# Patient Record
Sex: Female | Born: 1975 | Race: White | Hispanic: No | Marital: Married | Smoking: Former smoker
Health system: Southern US, Community
[De-identification: ages and names within clinical notes are randomized; demographics above are authoritative.]

## PROBLEM LIST (undated history)

## (undated) DIAGNOSIS — Z8041 Family history of malignant neoplasm of ovary: Secondary | ICD-10-CM

## (undated) DIAGNOSIS — Z800 Family history of malignant neoplasm of digestive organs: Secondary | ICD-10-CM

## (undated) DIAGNOSIS — R519 Headache, unspecified: Secondary | ICD-10-CM

## (undated) DIAGNOSIS — R112 Nausea with vomiting, unspecified: Secondary | ICD-10-CM

## (undated) DIAGNOSIS — Z806 Family history of leukemia: Secondary | ICD-10-CM

## (undated) DIAGNOSIS — D649 Anemia, unspecified: Secondary | ICD-10-CM

## (undated) DIAGNOSIS — Z808 Family history of malignant neoplasm of other organs or systems: Secondary | ICD-10-CM

## (undated) DIAGNOSIS — G8929 Other chronic pain: Secondary | ICD-10-CM

## (undated) DIAGNOSIS — T7840XA Allergy, unspecified, initial encounter: Secondary | ICD-10-CM

## (undated) DIAGNOSIS — I429 Cardiomyopathy, unspecified: Secondary | ICD-10-CM

## (undated) DIAGNOSIS — N200 Calculus of kidney: Secondary | ICD-10-CM

## (undated) DIAGNOSIS — R51 Headache: Secondary | ICD-10-CM

## (undated) DIAGNOSIS — K602 Anal fissure, unspecified: Secondary | ICD-10-CM

## (undated) DIAGNOSIS — Z9889 Other specified postprocedural states: Secondary | ICD-10-CM

## (undated) DIAGNOSIS — C50919 Malignant neoplasm of unspecified site of unspecified female breast: Secondary | ICD-10-CM

## (undated) DIAGNOSIS — Z803 Family history of malignant neoplasm of breast: Secondary | ICD-10-CM

## (undated) DIAGNOSIS — R011 Cardiac murmur, unspecified: Secondary | ICD-10-CM

## (undated) DIAGNOSIS — Z9089 Acquired absence of other organs: Secondary | ICD-10-CM

## (undated) HISTORY — DX: Cardiac murmur, unspecified: R01.1

## (undated) HISTORY — DX: Calculus of kidney: N20.0

## (undated) HISTORY — DX: Malignant neoplasm of unspecified site of unspecified female breast: C50.919

## (undated) HISTORY — DX: Anemia, unspecified: D64.9

## (undated) HISTORY — DX: Acquired absence of other organs: Z90.89

## (undated) HISTORY — DX: Anal fissure, unspecified: K60.2

## (undated) HISTORY — DX: Headache: R51

## (undated) HISTORY — DX: Family history of malignant neoplasm of digestive organs: Z80.0

## (undated) HISTORY — DX: Family history of malignant neoplasm of breast: Z80.3

## (undated) HISTORY — DX: Family history of leukemia: Z80.6

## (undated) HISTORY — DX: Headache, unspecified: R51.9

## (undated) HISTORY — PX: LEEP: SHX91

## (undated) HISTORY — DX: Cardiomyopathy, unspecified: I42.9

## (undated) HISTORY — DX: Family history of malignant neoplasm of ovary: Z80.41

## (undated) HISTORY — DX: Family history of malignant neoplasm of other organs or systems: Z80.8

## (undated) HISTORY — DX: Other chronic pain: G89.29

## (undated) HISTORY — DX: Allergy, unspecified, initial encounter: T78.40XA

---

## 1998-01-22 HISTORY — PX: OTHER SURGICAL HISTORY: SHX169

## 2008-10-07 ENCOUNTER — Encounter: Admission: RE | Admit: 2008-10-07 | Discharge: 2008-10-07 | Payer: Self-pay | Admitting: Obstetrics and Gynecology

## 2008-11-10 ENCOUNTER — Inpatient Hospital Stay (HOSPITAL_COMMUNITY): Admission: AD | Admit: 2008-11-10 | Discharge: 2008-11-10 | Payer: Self-pay | Admitting: Obstetrics and Gynecology

## 2008-11-21 ENCOUNTER — Inpatient Hospital Stay (HOSPITAL_COMMUNITY): Admission: AD | Admit: 2008-11-21 | Discharge: 2008-11-21 | Payer: Self-pay | Admitting: Obstetrics and Gynecology

## 2008-12-04 ENCOUNTER — Inpatient Hospital Stay (HOSPITAL_COMMUNITY): Admission: AD | Admit: 2008-12-04 | Discharge: 2008-12-07 | Payer: Self-pay | Admitting: Obstetrics and Gynecology

## 2009-01-17 ENCOUNTER — Telehealth: Payer: Self-pay | Admitting: Gastroenterology

## 2009-04-12 ENCOUNTER — Encounter: Admission: RE | Admit: 2009-04-12 | Discharge: 2009-04-12 | Payer: Self-pay | Admitting: Family Medicine

## 2009-12-23 ENCOUNTER — Encounter: Admission: RE | Admit: 2009-12-23 | Discharge: 2009-12-23 | Payer: Self-pay | Admitting: Family Medicine

## 2010-02-12 ENCOUNTER — Encounter: Payer: Self-pay | Admitting: Family Medicine

## 2010-04-26 LAB — CBC
HCT: 37.4 % (ref 36.0–46.0)
Hemoglobin: 12.6 g/dL (ref 12.0–15.0)
MCHC: 33.6 g/dL (ref 30.0–36.0)
MCV: 95 fL (ref 78.0–100.0)
Platelets: 297 10*3/uL (ref 150–400)
Platelets: 337 10*3/uL (ref 150–400)
RBC: 3.94 MIL/uL (ref 3.87–5.11)
RDW: 13.9 % (ref 11.5–15.5)
RDW: 14 % (ref 11.5–15.5)
WBC: 16.1 10*3/uL — ABNORMAL HIGH (ref 4.0–10.5)

## 2010-04-26 LAB — RPR: RPR Ser Ql: NONREACTIVE

## 2010-04-27 LAB — URINALYSIS, ROUTINE W REFLEX MICROSCOPIC
Nitrite: NEGATIVE
Specific Gravity, Urine: 1.025 (ref 1.005–1.030)
Urobilinogen, UA: 0.2 mg/dL (ref 0.0–1.0)

## 2010-04-27 LAB — URINE MICROSCOPIC-ADD ON

## 2010-04-27 LAB — URINE CULTURE

## 2010-05-19 ENCOUNTER — Encounter (INDEPENDENT_AMBULATORY_CARE_PROVIDER_SITE_OTHER): Payer: Self-pay | Admitting: Surgery

## 2010-05-31 ENCOUNTER — Encounter (INDEPENDENT_AMBULATORY_CARE_PROVIDER_SITE_OTHER): Payer: Self-pay | Admitting: Surgery

## 2010-06-27 ENCOUNTER — Other Ambulatory Visit: Payer: Self-pay | Admitting: Family Medicine

## 2010-06-27 ENCOUNTER — Ambulatory Visit
Admission: RE | Admit: 2010-06-27 | Discharge: 2010-06-27 | Disposition: A | Discharge status: Home / Self Care | Payer: 59 | Source: Ambulatory Visit | Attending: Family Medicine | Admitting: Family Medicine

## 2010-06-27 DIAGNOSIS — R05 Cough: Secondary | ICD-10-CM

## 2010-06-27 DIAGNOSIS — R059 Cough, unspecified: Secondary | ICD-10-CM

## 2010-10-20 ENCOUNTER — Other Ambulatory Visit: Payer: Self-pay | Admitting: Obstetrics and Gynecology

## 2010-10-20 DIAGNOSIS — N644 Mastodynia: Secondary | ICD-10-CM

## 2011-03-21 ENCOUNTER — Encounter: Payer: Self-pay | Admitting: Gastroenterology

## 2011-04-06 ENCOUNTER — Encounter: Payer: Self-pay | Admitting: Gastroenterology

## 2011-04-06 ENCOUNTER — Ambulatory Visit (INDEPENDENT_AMBULATORY_CARE_PROVIDER_SITE_OTHER): Payer: 59 | Admitting: Gastroenterology

## 2011-04-06 ENCOUNTER — Other Ambulatory Visit (INDEPENDENT_AMBULATORY_CARE_PROVIDER_SITE_OTHER): Payer: 59

## 2011-04-06 VITALS — BP 102/68 | HR 60 | Ht 68.0 in | Wt 147.2 lb

## 2011-04-06 DIAGNOSIS — R1032 Left lower quadrant pain: Secondary | ICD-10-CM

## 2011-04-06 LAB — BASIC METABOLIC PANEL
CO2: 28 mEq/L (ref 19–32)
Calcium: 9.3 mg/dL (ref 8.4–10.5)
Glucose, Bld: 89 mg/dL (ref 70–99)
Sodium: 140 mEq/L (ref 135–145)

## 2011-04-06 LAB — CBC WITH DIFFERENTIAL/PLATELET
Eosinophils Relative: 2 % (ref 0.0–5.0)
HCT: 38.6 % (ref 36.0–46.0)
Hemoglobin: 12.7 g/dL (ref 12.0–15.0)
Lymphs Abs: 1.5 10*3/uL (ref 0.7–4.0)
Monocytes Relative: 8.2 % (ref 3.0–12.0)
Platelets: 294 10*3/uL (ref 150.0–400.0)
WBC: 3.9 10*3/uL — ABNORMAL LOW (ref 4.5–10.5)

## 2011-04-06 MED ORDER — PEG-KCL-NACL-NASULF-NA ASC-C 100 G PO SOLR: 1.0000 | ORAL | Status: DC

## 2011-04-06 NOTE — Patient Instructions (Signed)
You will have labs checked today in the basement lab.  Please head down after you check out with the front desk  (cbc, bmet). You will be set up for a colonoscopy for minor bleeding, llq pains (LEC) in 3-4 weeks.  Miralax (OTC powder) take one full (smaller size) bottle today or tomorrow, then start one dose of the miralax every day. A copy of this information will be made available to Dr. Vincente Poli.

## 2011-04-06 NOTE — Progress Notes (Signed)
HPI: [name].  Left sided abd pains for about 5 weeks. The pain is nearly constant, waxes and wanes.  The pain is dull and constant.  The abd is tender.  No fevers or chills.  + sweats at night.  No changes in her bowels.  She has had a lot of bowel issues since gave birth (had anal fissure, caused pain, bleeding).  Eventually went to pelvic floor specialist.  She still has bleeding just about every time she has a BM.  Overall weight loss intentionally.  She takes colace about every other week.  She used to take benefiber after pregancy.   She had a recent transvaginal ultrasound and she was told that she probably had a lot of stool in her colon.  November 2010 delivery  Saw eagle MD 2 years ago, terrible anal pain.    camplyobacter in 2005, worked in [place].    Review of systems: Pertinent positive and negative review of systems were noted in the above HPI section. Complete review of systems was performed and was otherwise normal.    Past Medical History  Diagnosis Date  . S/P tonsillectomy     2000  . Delivery normal nov.2010  . Anemia   . Asthma   . Chronic headaches   . Kidney stone     x of    Past Surgical History  Procedure Date  . Tonsil removal 2000    No current outpatient prescriptions on file.    Allergies as of 04/06/2011 - Review Complete 04/06/2011  Allergen Reaction Noted  . Ceclor (cefaclor)  05/19/2010    Family History  Problem Relation Age of Onset  . Memory loss Mother   . Ulcerative colitis Mother   . Colon polyps Father   . Irritable bowel syndrome Sister   . Colon cancer Paternal Aunt   . Prostate cancer Paternal Uncle   . Colon polyps Paternal Uncle   . Ovarian cancer Maternal Grandmother   . Uterine cancer Maternal Grandmother     History   Social History  . Marital Status: Married    Spouse Name: N/A    Number of Children: 1  . Years of  Education: N/A   Occupational History  . Housewife    Social History Main Topics  . Smoking status: Former Games developer  . Smokeless tobacco: Never Used  . Alcohol Use: No  . Drug Use: No  . Sexually Active: Not on file   Other Topics Concern  . Not on file   Social History Narrative  . No narrative on file       Physical Exam: BP 102/68  Pulse 60  Ht 5\' 8"  (1.727 m)  Wt 147 lb 3.2 oz (66.769 kg)  BMI 22.38 kg/m2  LMP 03/31/2011 Constitutional: generally well-appearing Psychiatric: alert and oriented x3 Eyes: extraocular movements intact Mouth: oral pharynx moist, no lesions Neck: supple no lymphadenopathy Cardiovascular: heart regular rate and rhythm Lungs: clear to auscultation bilaterally Abdomen: soft, mildly tender on the left side, nondistended, no obvious ascites, no peritoneal signs, normal bowel sounds Extremities: no lower extremity edema bilaterally Skin: no lesions on visible extremities    Assessment and plan: 36 y.o. female with  intermittent rectal bleeding, left-sided abdominal pains.  I think we should proceed with colonoscopy at her since convenience given her intermittent rectal bleeding. She has never had a complete evaluation of her colon. She  was told she might have extra stool in her colon recently and she admits to previous trouble with constipation although she does move her bowels every day I wonder she is just chronically backed up and that can cause some of her left-sided pains. She will undergo a MiraLax purge and then start MiraLax 1 scoop daily following that. I doubt she has diverticulitis, I would expect her to be much sicker by now. She will get a CBC and basic metabolic profile today.

## 2011-04-09 ENCOUNTER — Telehealth: Payer: Self-pay | Admitting: Gastroenterology

## 2011-04-09 NOTE — Telephone Encounter (Signed)
We can probably do it next wedneday at [facility], she needs to purge before then though.

## 2011-04-09 NOTE — Telephone Encounter (Signed)
Left message on machine to call back  

## 2011-04-09 NOTE — Telephone Encounter (Signed)
Pt has been notified and reinstructed  

## 2011-04-09 NOTE — Telephone Encounter (Signed)
Pt has increasing pain in the lower abdomen.  She has NOT done the miralax purge as recommended.   She also states her insurance is changing and would like to move the procedure up before she looses coverage.  I advised the pt to use the miralax as suggested and I would call her back as soon as Dr Christella Hartigan reviews.  Can we move the procedure sooner?

## 2011-04-09 NOTE — Telephone Encounter (Signed)
04/18/11 930 am lec colon moved need to notify pt Left message on machine to call back

## 2011-04-16 ENCOUNTER — Telehealth: Payer: Self-pay | Admitting: Gastroenterology

## 2011-04-16 NOTE — Telephone Encounter (Signed)
$  20 dollar rebate coupon was left at the front desk for the pt to pick up on Thursday before her colon

## 2011-04-16 NOTE — Telephone Encounter (Signed)
Left message on machine to call back  

## 2011-04-18 ENCOUNTER — Encounter: Payer: Self-pay | Admitting: Gastroenterology

## 2011-04-18 ENCOUNTER — Ambulatory Visit (AMBULATORY_SURGERY_CENTER): Payer: 59 | Admitting: Gastroenterology

## 2011-04-18 VITALS — BP 118/76 | HR 75 | Temp 98.4°F | Resp 16 | Ht 68.0 in | Wt 147.0 lb

## 2011-04-18 DIAGNOSIS — D126 Benign neoplasm of colon, unspecified: Secondary | ICD-10-CM

## 2011-04-18 DIAGNOSIS — R1032 Left lower quadrant pain: Secondary | ICD-10-CM

## 2011-04-18 MED ORDER — SODIUM CHLORIDE 0.9 % IV SOLN: 500.0000 mL | INTRAVENOUS | Status: DC

## 2011-04-18 MED ORDER — HYOSCYAMINE SULFATE ER 0.375 MG PO TB12: 375.0000 ug | ORAL_TABLET | Freq: Two times a day (BID) | ORAL | Status: DC

## 2011-04-18 NOTE — Op Note (Signed)
[facility] [address] [place], [place]  [address]  COLONOSCOPY PROCEDURE REPORT  PATIENT:  [name], Kaitlin  MR#:  [id number] BIRTHDATE:  1975-12-15, 35 yrs. old  GENDER:  female ENDOSCOPIST:  Rachael Fee, MD REF. BY:  Marcelle Overlie, M.D. PROCEDURE DATE:  04/18/2011 PROCEDURE:  Colonoscopy with snare polypectomy ASA CLASS:  Class II INDICATIONS:  llq abd pain, minor rectal bleeding MEDICATIONS:   Fentanyl 100 mcg IV, These medications were titrated to patient response per physician's verbal order, Versed 10 mg IV  DESCRIPTION OF PROCEDURE:   After the risks benefits and alternatives of the procedure were thoroughly explained, informed consent was obtained.  Digital rectal exam was performed and revealed no rectal masses.   The LB 180AL [id number] endoscope was introduced through the anus and advanced to the terminal ileum which was intubated for a short distance, without limitations. The quality of the prep was good..  The instrument was then slowly withdrawn as the colon was fully examined. <<PROCEDUREIMAGES>> FINDINGS:  A diminutive polyp was found in the transverse colon. This was removed with cold snare and sent to pathology (jar 1) (see image4).  The terminal ileum appeared normal (see image3). This was otherwise a normal examination of the colon (see image5 and image1).   Retroflexed views in the rectum revealed no abnormalities. COMPLICATIONS:  None  ENDOSCOPIC IMPRESSION: 1) Diminutive polyp in the transverse colon, removed and sent to pathology 2) Normal terminal ileum 3) Otherwise normal examination  RECOMMENDATIONS: 1) If the polyp(s) removed today are proven to be adenomatous (pre-cancerous) polyps, you will need a repeat colonoscopy in 5 years. Otherwise you should continue to follow colorectal cancer screening guidelines for "routine risk" patients with colonoscopy in 10 years. You will receive a letter within 1-2 weeks with the results of  your biopsy as well as final recommendations. Please call my office if you have not received a letter after 3 weeks. 2) Trial of antispasm medicine to treat possible IBS. This was called into your pharmacy today.  ______________________________ Rachael Fee, MD  n. eSIGNED:   Rachael Fee at 04/18/2011 09:45 AM  Mattie Marlin, [phone]

## 2011-04-18 NOTE — Progress Notes (Signed)
Patient did not experience any of the following events: a burn prior to discharge; a fall within the facility; wrong site/side/patient/procedure/implant event; or a hospital transfer or hospital admission upon discharge from the facility. (G8907) Patient did not have preoperative order for IV antibiotic SSI prophylaxis. (G8918)  

## 2011-04-18 NOTE — Patient Instructions (Signed)
Discharge instructions given with verbal understanding. Handout on polyps given. Resume previous medications. YOU HAD AN ENDOSCOPIC PROCEDURE TODAY AT THE [facility]: Refer to the procedure report that was given to you for any specific questions about what was found during the examination.  If the procedure report does not answer your questions, please call your gastroenterologist to clarify.  If you requested that your care partner not be given the details of your procedure findings, then the procedure report has been included in a sealed envelope for you to review at your convenience later.  YOU SHOULD EXPECT: Some feelings of bloating in the abdomen. Passage of more gas than usual.  Walking can help get rid of the air that was put into your GI tract during the procedure and reduce the bloating. If you had a lower endoscopy (such as a colonoscopy or flexible sigmoidoscopy) you may notice spotting of blood in your stool or on the toilet paper. If you underwent a bowel prep for your procedure, then you may not have a normal bowel movement for a few days.  DIET: Your first meal following the procedure should be a light meal and then it is ok to progress to your normal diet.  A half-sandwich or bowl of soup is an example of a good first meal.  Heavy or fried foods are harder to digest and may make you feel nauseous or bloated.  Likewise meals heavy in dairy and vegetables can cause extra gas to form and this can also increase the bloating.  Drink plenty of fluids but you should avoid alcoholic beverages for 24 hours.  ACTIVITY: Your care partner should take you home directly after the procedure.  You should plan to take it easy, moving slowly for the rest of the day.  You can resume normal activity the day after the procedure however you should NOT DRIVE or use heavy machinery for 24 hours (because of the sedation medicines used during the test).    SYMPTOMS TO REPORT IMMEDIATELY: A  gastroenterologist can be reached at any hour.  During normal business hours, 8:30 AM to 5:00 PM Monday through Friday, call [phone].  After hours and on weekends, please call the GI answering service at [phone] who will take a message and have the physician on call contact you.   Following lower endoscopy (colonoscopy or flexible sigmoidoscopy):  Excessive amounts of blood in the stool  Significant tenderness or worsening of abdominal pains  Swelling of the abdomen that is new, acute  Fever of 100F or higher  FOLLOW UP: If any biopsies were taken you will be contacted by phone or by letter within the next 1-3 weeks.  Call your gastroenterologist if you have not heard about the biopsies in 3 weeks.  Our staff will call the home number listed on your records the next business day following your procedure to check on you and address any questions or concerns that you may have at that time regarding the information given to you following your procedure. This is a courtesy call and so if there is no answer at the home number and we have not heard from you through the emergency physician on call, we will assume that you have returned to your regular daily activities without incident.  SIGNATURES/CONFIDENTIALITY: You and/or your care partner have signed paperwork which will be entered into your electronic medical record.  These signatures attest to the fact that that the information above on your After Visit Summary has   been reviewed and is understood.  Full responsibility of the confidentiality of this discharge information lies with you and/or your care-partner. 

## 2011-04-19 ENCOUNTER — Telehealth: Payer: Self-pay | Admitting: *Deleted

## 2011-04-19 NOTE — Telephone Encounter (Signed)
Message left

## 2011-04-27 ENCOUNTER — Other Ambulatory Visit: Payer: Self-pay

## 2011-04-30 ENCOUNTER — Encounter: Payer: Self-pay | Admitting: Gastroenterology

## 2011-05-02 ENCOUNTER — Telehealth: Payer: Self-pay | Admitting: Gastroenterology

## 2011-05-03 NOTE — Telephone Encounter (Signed)
Left message on machine to call back  

## 2011-05-03 NOTE — Telephone Encounter (Signed)
The pt is calling because she says she has noticed NO difference in her pain on the levbid prescribed.  She also says that her pain is worse when her bladder is full or she has to have a bowel movement and wonders if the cause could be something other than GI.  Please advise

## 2011-05-04 MED ORDER — HYOSCYAMINE SULFATE 0.125 MG SL SUBL: 0.1250 mg | SUBLINGUAL_TABLET | SUBLINGUAL | Status: DC | PRN

## 2011-05-04 NOTE — Telephone Encounter (Signed)
Left message on machine to call back  

## 2011-05-04 NOTE — Telephone Encounter (Signed)
Sounds like IBS type pains, please tell her to stop the lebvid and I called her in some sublingual antispasm meds to try (should be faster acting and may work better for her)

## 2011-05-04 NOTE — Telephone Encounter (Signed)
Pt has been notified.

## 2011-05-08 ENCOUNTER — Other Ambulatory Visit: Payer: 59 | Admitting: Gastroenterology

## 2011-09-04 LAB — OB RESULTS CONSOLE ABO/RH: RH Type: POSITIVE

## 2011-09-04 LAB — OB RESULTS CONSOLE GC/CHLAMYDIA
Chlamydia: NEGATIVE
Gonorrhea: NEGATIVE

## 2011-09-04 LAB — OB RESULTS CONSOLE HEPATITIS B SURFACE ANTIGEN: Hepatitis B Surface Ag: NEGATIVE

## 2011-09-10 ENCOUNTER — Other Ambulatory Visit: Payer: Self-pay | Admitting: Obstetrics and Gynecology

## 2011-11-28 ENCOUNTER — Ambulatory Visit (HOSPITAL_COMMUNITY)
Admission: RE | Admit: 2011-11-28 | Discharge: 2011-11-28 | Disposition: A | Discharge status: Home / Self Care | Payer: Managed Care, Other (non HMO) | Source: Ambulatory Visit | Attending: Obstetrics and Gynecology | Admitting: Obstetrics and Gynecology

## 2011-11-28 DIAGNOSIS — R52 Pain, unspecified: Secondary | ICD-10-CM

## 2011-11-28 DIAGNOSIS — M7989 Other specified soft tissue disorders: Secondary | ICD-10-CM

## 2011-11-28 NOTE — Progress Notes (Signed)
*  Preliminary Results* Bilateral lower extremity venous duplex completed. Bilateral lower extremities are negative for deep vein thrombosis. There is no evidence of Baker's cyst bilaterally. Attempted to call in preliminary results with no answer, patient advised to go home and if there are further instructions she will be contacted by phone.  11/28/2011 2:34 PM Gertie Fey, RDMS, RDCS

## 2012-03-12 ENCOUNTER — Encounter (HOSPITAL_COMMUNITY): Payer: Self-pay

## 2012-03-12 ENCOUNTER — Inpatient Hospital Stay (HOSPITAL_COMMUNITY)
Admission: AD | Admit: 2012-03-12 | Discharge: 2012-03-12 | Disposition: A | Discharge status: Home / Self Care | Payer: Managed Care, Other (non HMO) | Source: Ambulatory Visit | Attending: Obstetrics and Gynecology | Admitting: Obstetrics and Gynecology

## 2012-03-12 DIAGNOSIS — R102 Pelvic and perineal pain: Secondary | ICD-10-CM

## 2012-03-12 DIAGNOSIS — O99891 Other specified diseases and conditions complicating pregnancy: Secondary | ICD-10-CM | POA: Insufficient documentation

## 2012-03-12 DIAGNOSIS — R109 Unspecified abdominal pain: Secondary | ICD-10-CM | POA: Insufficient documentation

## 2012-03-12 DIAGNOSIS — O26899 Other specified pregnancy related conditions, unspecified trimester: Secondary | ICD-10-CM

## 2012-03-12 DIAGNOSIS — O9989 Other specified diseases and conditions complicating pregnancy, childbirth and the puerperium: Secondary | ICD-10-CM

## 2012-03-12 DIAGNOSIS — N949 Unspecified condition associated with female genital organs and menstrual cycle: Secondary | ICD-10-CM

## 2012-03-12 LAB — URINE MICROSCOPIC-ADD ON

## 2012-03-12 LAB — CBC
HCT: 31.6 % — ABNORMAL LOW (ref 36.0–46.0)
Hemoglobin: 10.7 g/dL — ABNORMAL LOW (ref 12.0–15.0)
MCH: 31.2 pg (ref 26.0–34.0)
MCV: 92.1 fL (ref 78.0–100.0)
Platelets: 255 10*3/uL (ref 150–400)
RBC: 3.43 MIL/uL — ABNORMAL LOW (ref 3.87–5.11)
WBC: 9.4 10*3/uL (ref 4.0–10.5)

## 2012-03-12 LAB — URINALYSIS, ROUTINE W REFLEX MICROSCOPIC
Glucose, UA: NEGATIVE mg/dL
Ketones, ur: NEGATIVE mg/dL
Protein, ur: NEGATIVE mg/dL

## 2012-03-12 MED ORDER — CYCLOBENZAPRINE HCL 10 MG PO TABS
5.0000 mg | ORAL_TABLET | Freq: Once | ORAL | Status: AC
  Administered 2012-03-12: 5 mg via ORAL
  Filled 2012-03-12: qty 1

## 2012-03-12 MED ORDER — CYCLOBENZAPRINE HCL 10 MG PO TABS: 5.0000 mg | ORAL_TABLET | Freq: Three times a day (TID) | ORAL | Status: DC | PRN

## 2012-03-12 NOTE — MAU Provider Note (Signed)
Chief Complaint:  Abdominal Pain   First Provider Initiated Contact with Patient 03/12/12 2026      HPI: Kaitlin [name] is a 37 y.o. G2P1001 at Unknown who presents to maternity admissions reporting left sided abd pain since 1300 this afternoon. No Hx similar pain. Describes pain as sharp, constant , but intermittently worse. Pain 8-9/10 at worst, 6/10 now. Worse w/ mvmt. Improves w/ rest. No relationship to contractions, urination and defecation. Hx chronic constipation on MiraLax. Regular BMs now. Last BM this morning.   Past Medical History: Past Medical History  Diagnosis Date  . S/P tonsillectomy     2000  . Delivery normal nov.2010  . Anemia   . Asthma   . Chronic headaches   . Kidney stone     x of    Past obstetric history: OB History   Grav Para Term Preterm Abortions TAB SAB Ect Mult Living   2 1 1  0 0 0 0 0 0 1     # Outc Date GA Lbr Len/2nd Wgt Sex Del Anes PTL Lv   1 GRA            2 TRM               Past Surgical History: Past Surgical History  Procedure Laterality Date  . Tonsil removal  2000    Family History: Family History  Problem Relation Age of Onset  . Memory loss Mother   . Ulcerative colitis Mother   . Colon polyps Father   . Irritable bowel syndrome Sister   . Colon cancer Paternal Aunt   . Prostate cancer Paternal Uncle   . Colon polyps Paternal Uncle   . Ovarian cancer Maternal Grandmother   . Uterine cancer Maternal Grandmother     Social History: History  Substance Use Topics  . Smoking status: Former Games developer  . Smokeless tobacco: Never Used  . Alcohol Use: No    Allergies:  Allergies  Allergen Reactions  . Ceclor (Cefaclor) Shortness Of Breath    Meds:  Prescriptions prior to admission  Medication Sig Dispense Refill  . acetaminophen (TYLENOL) 500 MG tablet Take 500 mg by mouth every 6 (six) hours as needed for pain.      . Prenatal Vit-Fe Fumarate-FA (PRENATAL MULTIVITAMIN) TABS Take 1 tablet by mouth daily.      .  hyoscyamine (LEVSIN SL) 0.125 MG SL tablet Place 1 tablet (0.125 mg total) under the tongue every 4 (four) hours as needed (pain, cramping).  60 tablet  2    ROS: Reports nausea, mild contractions. Denies fever, chills, dysuria, frequency, urgency, hematuria, injury, leakage of fluid or vaginal bleeding. Good fetal movement.   Physical Exam  Blood pressure 103/57, pulse 90, temperature 98.3 F (36.8 C), temperature source Oral, resp. rate 20, height 5\' 9"  (1.753 m), weight 81.92 kg (180 lb 9.6 oz). GENERAL: Well-developed, well-nourished female in mild distress.  HEENT: normocephalic HEART: normal rate RESP: normal effort ABDOMEN: Soft, left low abd tenderness, gravid appropriate for gestational age. Left low back tenderness. No CVAT.  EXTREMITIES: Nontender, no edema NEURO: alert and oriented SPECULUM EXAM: Deferred  Dilation: 1.5 Effacement (%): 50 Cervical Position: Posterior Station: -2 Presentation: Vertex Exam by:: D Nelson RN  FHT:  Baseline 130 , moderate variability, accelerations present, no decelerations Contractions: irreg, mild   Labs: Results for orders placed during the hospital encounter of 03/12/12 (from the past 24 hour(s))  URINALYSIS, ROUTINE W REFLEX MICROSCOPIC  Status: Abnormal   Collection Time    03/12/12  7:17 PM      Result Value Range   Color, Urine YELLOW  YELLOW   APPearance CLEAR  CLEAR   Specific Gravity, Urine 1.015  1.005 - 1.030   pH 6.5  5.0 - 8.0   Glucose, UA NEGATIVE  NEGATIVE mg/dL   Hgb urine dipstick NEGATIVE  NEGATIVE   Bilirubin Urine NEGATIVE  NEGATIVE   Ketones, ur NEGATIVE  NEGATIVE mg/dL   Protein, ur NEGATIVE  NEGATIVE mg/dL   Urobilinogen, UA 0.2  0.0 - 1.0 mg/dL   Nitrite NEGATIVE  NEGATIVE   Leukocytes, UA SMALL (*) NEGATIVE  URINE MICROSCOPIC-ADD ON     Status: Abnormal   Collection Time    03/12/12  7:17 PM      Result Value Range   Squamous Epithelial / LPF FEW (*) RARE   WBC, UA 3-6  <3 WBC/hpf   RBC / HPF  0-2  <3 RBC/hpf   Bacteria, UA FEW (*) RARE  CBC     Status: Abnormal   Collection Time    03/12/12  8:10 PM      Result Value Range   WBC 9.4  4.0 - 10.5 K/uL   RBC 3.43 (*) 3.87 - 5.11 MIL/uL   Hemoglobin 10.7 (*) 12.0 - 15.0 g/dL   HCT 16.1 (*) 09.6 - 04.5 %   MCV 92.1  78.0 - 100.0 fL   MCH 31.2  26.0 - 34.0 pg   MCHC 33.9  30.0 - 36.0 g/dL   RDW 40.9  81.1 - 91.4 %   Platelets 255  150 - 400 K/uL    Imaging:  No results found. MAU Course: 2102: Discussed exam and labs w/ Dr. Vincente Poli. Low suspicion of kidney-related pain. Most likely round ligament/other MS pain. Will try Flexeril. Offered to observe for improvement of pain vs D/C dome now. Prefers D/C home now.    Assessment: 1. Pain of round ligament complicating pregnancy, antepartum    Plan: Discharge home Labor precautions and fetal kick counts Comfort measures   Medication List    STOP taking these medications       hyoscyamine 0.125 MG SL tablet  Commonly known as:  LEVSIN SL      TAKE these medications       acetaminophen 500 MG tablet  Commonly known as:  TYLENOL  Take 500 mg by mouth every 6 (six) hours as needed for pain.     cyclobenzaprine 10 MG tablet  Commonly known as:  FLEXERIL  Take 0.5-1 tablets (5-10 mg total) by mouth 3 (three) times daily as needed for muscle spasms.     prenatal multivitamin Tabs  Take 1 tablet by mouth daily.       Follow-up Information   Follow up with GREWAL,MICHELLE L, MD In 1 week.   Contact information:   [address] [place] [address] [phone]       Follow up with [facility] [place] MATERNITY ADMISSIONS. (As needed if symptoms worsen)    Contact information:   [address] [place] [address] [phone]      Dorathy Kinsman, [place] 03/12/2012 8:27 PM

## 2012-03-12 NOTE — MAU Note (Signed)
Pt states L side abdominal pain that started today around one pm.  States she decreased activites, rested with no relief in the pain.  States she has not taken any medications. Denies any bleeding or discharge from the vagina.

## 2012-03-13 LAB — URINE CULTURE

## 2012-03-20 ENCOUNTER — Encounter (HOSPITAL_COMMUNITY): Payer: Self-pay | Admitting: Anesthesiology

## 2012-03-20 ENCOUNTER — Encounter (HOSPITAL_COMMUNITY): Payer: Self-pay

## 2012-03-20 ENCOUNTER — Inpatient Hospital Stay (HOSPITAL_COMMUNITY)
Admission: AD | Admit: 2012-03-20 | Discharge: 2012-03-22 | DRG: 775 | Disposition: A | Discharge status: Home / Self Care | Payer: Managed Care, Other (non HMO) | Source: Ambulatory Visit | Attending: Obstetrics and Gynecology | Admitting: Obstetrics and Gynecology

## 2012-03-20 ENCOUNTER — Inpatient Hospital Stay (HOSPITAL_COMMUNITY): Payer: Managed Care, Other (non HMO) | Admitting: Anesthesiology

## 2012-03-20 DIAGNOSIS — O09529 Supervision of elderly multigravida, unspecified trimester: Secondary | ICD-10-CM | POA: Diagnosis present

## 2012-03-20 LAB — CBC
MCH: 31 pg (ref 26.0–34.0)
MCHC: 34 g/dL (ref 30.0–36.0)
RDW: 13.4 % (ref 11.5–15.5)

## 2012-03-20 MED ORDER — FENTANYL 2.5 MCG/ML BUPIVACAINE 1/10 % EPIDURAL INFUSION (WH - ANES)
14.0000 mL/h | INTRAMUSCULAR | Status: DC
  Administered 2012-03-20: 14 mL/h via EPIDURAL
  Filled 2012-03-20: qty 125

## 2012-03-20 MED ORDER — LACTATED RINGERS IV SOLN
500.0000 mL | Freq: Once | INTRAVENOUS | Status: AC
  Administered 2012-03-20: 500 mL via INTRAVENOUS

## 2012-03-20 MED ORDER — ONDANSETRON HCL 4 MG/2ML IJ SOLN: 4.0000 mg | Freq: Four times a day (QID) | INTRAMUSCULAR | Status: DC | PRN

## 2012-03-20 MED ORDER — EPHEDRINE 5 MG/ML INJ: 10.0000 mg | INTRAVENOUS | Status: DC | PRN

## 2012-03-20 MED ORDER — LIDOCAINE-EPINEPHRINE (PF) 2 %-1:200000 IJ SOLN
INTRAMUSCULAR | Status: DC | PRN
  Administered 2012-03-20: 5 mL via EPIDURAL

## 2012-03-20 MED ORDER — LACTATED RINGERS IV SOLN
INTRAVENOUS | Status: DC
  Administered 2012-03-20: 23:00:00 via INTRAVENOUS

## 2012-03-20 MED ORDER — OXYTOCIN 40 UNITS IN LACTATED RINGERS INFUSION - SIMPLE MED
62.5000 mL/h | INTRAVENOUS | Status: DC
  Administered 2012-03-21: 62.5 mL/h via INTRAVENOUS
  Filled 2012-03-20: qty 1000

## 2012-03-20 MED ORDER — EPHEDRINE 5 MG/ML INJ
10.0000 mg | INTRAVENOUS | Status: DC | PRN
  Filled 2012-03-20: qty 4

## 2012-03-20 MED ORDER — PHENYLEPHRINE 40 MCG/ML (10ML) SYRINGE FOR IV PUSH (FOR BLOOD PRESSURE SUPPORT)
80.0000 ug | PREFILLED_SYRINGE | INTRAVENOUS | Status: DC | PRN
  Filled 2012-03-20: qty 5

## 2012-03-20 MED ORDER — FLEET ENEMA 7-19 GM/118ML RE ENEM: 1.0000 | ENEMA | Freq: Every day | RECTAL | Status: DC | PRN

## 2012-03-20 MED ORDER — ACETAMINOPHEN 325 MG PO TABS: 650.0000 mg | ORAL_TABLET | ORAL | Status: DC | PRN

## 2012-03-20 MED ORDER — CITRIC ACID-SODIUM CITRATE 334-500 MG/5ML PO SOLN: 30.0000 mL | ORAL | Status: DC | PRN

## 2012-03-20 MED ORDER — PHENYLEPHRINE 40 MCG/ML (10ML) SYRINGE FOR IV PUSH (FOR BLOOD PRESSURE SUPPORT): 80.0000 ug | PREFILLED_SYRINGE | INTRAVENOUS | Status: DC | PRN

## 2012-03-20 MED ORDER — OXYCODONE-ACETAMINOPHEN 5-325 MG PO TABS: 1.0000 | ORAL_TABLET | ORAL | Status: DC | PRN

## 2012-03-20 MED ORDER — IBUPROFEN 600 MG PO TABS: 600.0000 mg | ORAL_TABLET | Freq: Four times a day (QID) | ORAL | Status: DC | PRN

## 2012-03-20 MED ORDER — LIDOCAINE HCL (PF) 1 % IJ SOLN
30.0000 mL | INTRAMUSCULAR | Status: DC | PRN
  Filled 2012-03-20: qty 30

## 2012-03-20 MED ORDER — DIPHENHYDRAMINE HCL 50 MG/ML IJ SOLN: 12.5000 mg | INTRAMUSCULAR | Status: DC | PRN

## 2012-03-20 MED ORDER — OXYTOCIN BOLUS FROM INFUSION
500.0000 mL | INTRAVENOUS | Status: DC
  Administered 2012-03-21: 500 mL via INTRAVENOUS

## 2012-03-20 MED ORDER — LACTATED RINGERS IV SOLN: 500.0000 mL | INTRAVENOUS | Status: DC | PRN

## 2012-03-20 NOTE — Anesthesia Procedure Notes (Signed)

## 2012-03-20 NOTE — Anesthesia Preprocedure Evaluation (Signed)

## 2012-03-20 NOTE — MAU Note (Signed)
PT SAYS SHE HURT BAD AT 530PM.  IN OFFICE STRIPPED MEMBRANES-- VE  3 CM.   DENIES HSV AND MRSA.

## 2012-03-21 ENCOUNTER — Encounter (HOSPITAL_COMMUNITY): Payer: Self-pay | Admitting: Family Medicine

## 2012-03-21 LAB — RPR: RPR Ser Ql: NONREACTIVE

## 2012-03-21 LAB — CBC
HCT: 33.4 % — ABNORMAL LOW (ref 36.0–46.0)
Hemoglobin: 11.4 g/dL — ABNORMAL LOW (ref 12.0–15.0)
MCH: 31.1 pg (ref 26.0–34.0)
MCHC: 34.1 g/dL (ref 30.0–36.0)
RDW: 13.4 % (ref 11.5–15.5)

## 2012-03-21 MED ORDER — TETANUS-DIPHTH-ACELL PERTUSSIS 5-2.5-18.5 LF-MCG/0.5 IM SUSP: 0.5000 mL | Freq: Once | INTRAMUSCULAR | Status: DC

## 2012-03-21 MED ORDER — BENZOCAINE-MENTHOL 20-0.5 % EX AERO
1.0000 "application " | INHALATION_SPRAY | CUTANEOUS | Status: DC | PRN
  Administered 2012-03-21: 1 via TOPICAL
  Filled 2012-03-21: qty 56

## 2012-03-21 MED ORDER — OXYCODONE-ACETAMINOPHEN 5-325 MG PO TABS: 1.0000 | ORAL_TABLET | ORAL | Status: DC | PRN

## 2012-03-21 MED ORDER — IBUPROFEN 600 MG PO TABS
600.0000 mg | ORAL_TABLET | Freq: Four times a day (QID) | ORAL | Status: DC
  Administered 2012-03-21 – 2012-03-22 (×6): 600 mg via ORAL
  Filled 2012-03-21 (×7): qty 1

## 2012-03-21 MED ORDER — MEASLES, MUMPS & RUBELLA VAC ~~LOC~~ INJ
0.5000 mL | INJECTION | Freq: Once | SUBCUTANEOUS | Status: DC
  Filled 2012-03-21: qty 0.5

## 2012-03-21 MED ORDER — SENNOSIDES-DOCUSATE SODIUM 8.6-50 MG PO TABS
2.0000 | ORAL_TABLET | Freq: Every day | ORAL | Status: DC
  Administered 2012-03-21: 2 via ORAL

## 2012-03-21 MED ORDER — SIMETHICONE 80 MG PO CHEW: 80.0000 mg | CHEWABLE_TABLET | ORAL | Status: DC | PRN

## 2012-03-21 MED ORDER — MEDROXYPROGESTERONE ACETATE 150 MG/ML IM SUSP: 150.0000 mg | INTRAMUSCULAR | Status: DC | PRN

## 2012-03-21 MED ORDER — DIBUCAINE 1 % RE OINT: 1.0000 "application " | TOPICAL_OINTMENT | RECTAL | Status: DC | PRN

## 2012-03-21 MED ORDER — ONDANSETRON HCL 4 MG PO TABS: 4.0000 mg | ORAL_TABLET | ORAL | Status: DC | PRN

## 2012-03-21 MED ORDER — ONDANSETRON HCL 4 MG/2ML IJ SOLN: 4.0000 mg | INTRAMUSCULAR | Status: DC | PRN

## 2012-03-21 MED ORDER — WITCH HAZEL-GLYCERIN EX PADS: 1.0000 "application " | MEDICATED_PAD | CUTANEOUS | Status: DC | PRN

## 2012-03-21 MED ORDER — DIPHENHYDRAMINE HCL 25 MG PO CAPS: 25.0000 mg | ORAL_CAPSULE | Freq: Four times a day (QID) | ORAL | Status: DC | PRN

## 2012-03-21 MED ORDER — PRENATAL MULTIVITAMIN CH
1.0000 | ORAL_TABLET | Freq: Every day | ORAL | Status: DC
  Filled 2012-03-21 (×2): qty 1

## 2012-03-21 MED ORDER — LANOLIN HYDROUS EX OINT: TOPICAL_OINTMENT | CUTANEOUS | Status: DC | PRN

## 2012-03-21 NOTE — Progress Notes (Signed)
Post Partum Day 0 Subjective: no complaints, up ad lib, voiding and tolerating PO  Objective: Blood pressure 109/58, pulse 79, temperature 98.4 F (36.9 C), temperature source Oral, resp. rate 18, last menstrual period 03/31/2011, SpO2 99.00%, unknown if currently breastfeeding.  Physical Exam:  General: alert and cooperative Lochia: appropriate Uterine Fundus: firm Incision: perineum intact DVT Evaluation: No evidence of DVT seen on physical exam. Negative Homan's sign. No cords or calf tenderness. No significant calf/ankle edema.   Recent Labs  03/20/12 2300 03/21/12 0550  HGB 12.3 11.4*  HCT 36.2 33.4*    Assessment/Plan: Plan for discharge tomorrow   LOS: 1 day   Kaitlin [name] G 03/21/2012, 8:02 AM

## 2012-03-21 NOTE — Anesthesia Postprocedure Evaluation (Signed)
  Anesthesia Post-op Note  Patient: Kaitlin [name]  Procedure(s) Performed: * No procedures listed *  Patient Location: Mother/Baby  Anesthesia Type:Epidural  Level of Consciousness: awake, alert  and oriented  Airway and Oxygen Therapy: Patient Spontanous Breathing  Post-op Pain: mild  Post-op Assessment: Patient's Cardiovascular Status Stable and Respiratory Function Stable  Post-op Vital Signs: stable  Complications: No apparent anesthesia complications

## 2012-03-21 NOTE — Progress Notes (Signed)
UR chart review completed.  

## 2012-03-21 NOTE — Progress Notes (Signed)
SVD of vigerous female infant w/ apgars of 9,9.  Placenta delivered spontaneous w/ 3VC.   2nd degree lac repaired w/ 3-0 vicryl rapide.  Fundus firm.  EBL 250cc .

## 2012-03-21 NOTE — H&P (Signed)
37 yo G2P1 @ 38+ 3 wks presents w/ active labor.  Pregnancy uncomplicated.  Past History - see hollister, GBS negative All:  Ceclor  AF, VSS + FHT  toco Q2-3 Gen - NAD Abd - gravid, NT Ext - NT, 1+ edema bilaterally Cvx c/c/0 AROM - clear  A/P:  Admit Epidural  Exp mngt

## 2012-03-22 NOTE — Discharge Summary (Signed)
Obstetric Discharge Summary Reason for Admission: onset of labor Prenatal Procedures: none Intrapartum Procedures: spontaneous vaginal delivery Postpartum Procedures: none Complications-Operative and Postpartum: none Hemoglobin  Date Value Range Status  03/21/2012 11.4* 12.0 - 15.0 g/dL Final     HCT  Date Value Range Status  03/21/2012 33.4* 36.0 - 46.0 % Final    Physical Exam:  General: alert Lochia: appropriate Uterine Fundus: firm Incision: healing well DVT Evaluation: No evidence of DVT seen on physical exam.  Discharge Diagnoses: Term Pregnancy-delivered  Discharge Information: Date: 03/22/2012 Activity: pelvic rest Diet: routine Medications: Ibuprofen Condition: stable Instructions: refer to practice specific booklet Discharge to: home Follow-up Information   Follow up with [facility], [place].. Schedule an appointment as soon as possible for a visit in 6 weeks.   Contact information:   [address] [place] [address] [phone]      Newborn Data: Live born female  Birth Weight: 7 lb 11.5 oz (3501 g) APGAR: 9, 9  Home with mother.  Kaitlin [name] M 03/22/2012, 8:40 AM

## 2012-03-22 NOTE — Progress Notes (Signed)
Post Partum Day 1 Subjective: no complaints  Objective: Blood pressure 118/70, pulse 91, temperature 98 F (36.7 C), temperature source Oral, resp. rate 18, last menstrual period 03/31/2011, SpO2 97.00%, unknown if currently breastfeeding.  Physical Exam:  General: alert Lochia: appropriate Uterine Fundus: firm Incision: healing well DVT Evaluation: No evidence of DVT seen on physical exam.   Recent Labs  03/20/12 2300 03/21/12 0550  HGB 12.3 11.4*  HCT 36.2 33.4*    Assessment/Plan: Discharge home   LOS: 2 days   Kaitlin [name] M 03/22/2012, 8:38 AM

## 2012-03-24 ENCOUNTER — Telehealth (HOSPITAL_COMMUNITY): Payer: Self-pay | Admitting: *Deleted

## 2012-03-24 NOTE — Telephone Encounter (Signed)
Resolve episode 

## 2012-04-09 ENCOUNTER — Ambulatory Visit (HOSPITAL_COMMUNITY)
Admission: RE | Admit: 2012-04-09 | Discharge: 2012-04-09 | Disposition: A | Discharge status: Home / Self Care | Payer: Managed Care, Other (non HMO) | Source: Ambulatory Visit | Attending: Obstetrics and Gynecology | Admitting: Obstetrics and Gynecology

## 2012-04-09 NOTE — Lactation Note (Signed)
Adult Lactation Consultation Outpatient Visit Note  Patient Name: Kaitlin [name](mother)   BABY: [name] Date of Birth: 12/06/1975                          DOB: [birth year] Gestational Age at Delivery: 51.4              BIRTH WEIGHT: 7-11.5 Type of Delivery:   NVD                                     DISCHARGE WEIGHT:  7-7                                                                     WEIGHT TODAY: 8-1.5 Breastfeeding History: Frequency of Breastfeeding: EVERY 3 HOURS Length of Feeding: 15-30 MINUTES Voids: 5-6 Stools:4- 6 YELLOW  Supplementing / Method:NONE Pumping:  Type of Pump:   Frequency:NONE  Volume:    Comments:    Consultation Evaluation:  Mom and 81 week old baby here with c/o sore pink nipples.  Mom states right nipple cracked in the hospital but is healed now.  Mom states that nipples are flattened at times when baby comes off breast.  Baby is currently being treated for yeast diaper rash.  Baby's mouth shows a very slight white coating and mom will start oral care on baby.  Both nipples intact but pink/red in color.  Mom describes mild soreness during and after feeding.  Observed mom latch baby onto right breast using cross cradle hold.  Baby obtained wide gape with flanged lips but baby was tummy to ceiling and areola being stretched with feeding.  Assisted mom with turning baby tummy to tummy and bringing him closer to breast.  Baby fed actively and nipple looked good after feeding.  Assisted mom with same technique on left breast and baby latched easily and well.  After baby was nursing for several minutes it was noted that baby had slipped to nipple and he was only non-nutritive sucking.  I pointed this out to mom and she took baby off the breast.  Nipple flattened after baby came off.  Mom is also feeding baby some as she is walking around house and tending to her 36 year old.  Reviewed importance of good support and technique.  Discussed with mom that she possibly had  both some latch issue and early yeast seeing baby with yeast diaper rash and pinkness of nipples.  Thrush treatment for mom and baby handout given and reviewed.  Instructed to call [facility] office if no improvement.  Initial Feeding Assessment:RIGHT BREAST X 15 MINUTES/LEFT BREAST X 10 MINUTES Pre-feed [id number]:0454 Post-feed [id number]:1478 Amount Transferred:106 MLS Comments:  Additional Feeding Assessment: Pre-feed Weight: Post-feed Weight: Amount Transferred: Comments:  Additional Feeding Assessment: Pre-feed Weight: Post-feed Weight: Amount Transferred: Comments:  Total Breast milk Transferred this Visit: 106 MLS Total Supplement Given: NONE  Additional Interventions:   Follow-Up WILL CALL PRN      Hansel Feinstein 04/09/2012, 2:03 PM

## 2012-04-25 ENCOUNTER — Other Ambulatory Visit: Payer: Self-pay | Admitting: Obstetrics and Gynecology

## 2013-09-09 ENCOUNTER — Other Ambulatory Visit: Payer: Self-pay | Admitting: Obstetrics and Gynecology

## 2013-09-10 LAB — CYTOLOGY - PAP

## 2013-09-17 ENCOUNTER — Other Ambulatory Visit: Payer: Self-pay | Admitting: Obstetrics and Gynecology

## 2013-11-06 ENCOUNTER — Ambulatory Visit
Admission: RE | Admit: 2013-11-06 | Discharge: 2013-11-06 | Disposition: A | Discharge status: Home / Self Care | Payer: Managed Care, Other (non HMO) | Source: Ambulatory Visit | Attending: Family Medicine | Admitting: Family Medicine

## 2013-11-06 ENCOUNTER — Other Ambulatory Visit: Payer: Self-pay | Admitting: Family Medicine

## 2013-11-06 DIAGNOSIS — M545 Low back pain, unspecified: Secondary | ICD-10-CM

## 2013-11-06 DIAGNOSIS — R109 Unspecified abdominal pain: Secondary | ICD-10-CM

## 2013-11-23 ENCOUNTER — Encounter (HOSPITAL_COMMUNITY): Payer: Self-pay | Admitting: Family Medicine

## 2014-01-27 ENCOUNTER — Other Ambulatory Visit: Payer: Self-pay | Admitting: Obstetrics and Gynecology

## 2014-01-28 LAB — CYTOLOGY - PAP

## 2014-02-18 ENCOUNTER — Other Ambulatory Visit: Payer: Self-pay | Admitting: Obstetrics and Gynecology

## 2014-06-16 ENCOUNTER — Other Ambulatory Visit: Payer: Self-pay | Admitting: Obstetrics and Gynecology

## 2014-06-18 LAB — CYTOLOGY - PAP

## 2016-02-27 ENCOUNTER — Encounter: Payer: Self-pay | Admitting: Gastroenterology

## 2016-07-19 ENCOUNTER — Ambulatory Visit (AMBULATORY_SURGERY_CENTER): Payer: Self-pay

## 2016-07-19 VITALS — Ht 66.5 in | Wt 144.0 lb

## 2016-07-19 DIAGNOSIS — Z86010 Personal history of colonic polyps: Secondary | ICD-10-CM

## 2016-07-19 MED ORDER — NA SULFATE-K SULFATE-MG SULF 17.5-3.13-1.6 GM/177ML PO SOLN: 1.0000 | Freq: Once | ORAL | 0 refills | Status: AC

## 2016-07-19 NOTE — Progress Notes (Signed)
Denies allergies to eggs or soy products. Denies complication of anesthesia or sedation. Denies use of weight loss medication. Denies use of O2.   Emmi instructions declined.  

## 2016-07-20 ENCOUNTER — Encounter: Payer: Self-pay | Admitting: Gastroenterology

## 2016-07-31 ENCOUNTER — Ambulatory Visit (AMBULATORY_SURGERY_CENTER): Payer: BLUE CROSS/BLUE SHIELD | Admitting: Gastroenterology

## 2016-07-31 ENCOUNTER — Encounter: Payer: Self-pay | Admitting: Gastroenterology

## 2016-07-31 VITALS — BP 104/59 | HR 75 | Temp 98.4°F | Resp 13 | Ht 66.5 in | Wt 144.0 lb

## 2016-07-31 DIAGNOSIS — Z860100 Personal history of colon polyps, unspecified: Secondary | ICD-10-CM

## 2016-07-31 MED ORDER — SODIUM CHLORIDE 0.9 % IV SOLN: 500.0000 mL | INTRAVENOUS | Status: DC

## 2016-07-31 NOTE — Op Note (Signed)
[facility] Patient Name: Kaitlin [name] Procedure Date: 07/31/2016 9:13 AM MRN: [id number] Endoscopist: Milus Banister , MD Age: 41 Referring MD:  Date of Birth: [birth year] Gender: Female Account #: [id number] Procedure:                Colonoscopy Indications:              High risk colon cancer surveillance: Personal                            history of colonic polyps; 2013 colonoscopy                            diminutive SSA removed Medicines:                Fentanyl 200 micrograms IV, Midazolam 7 mg IV Procedure:                Pre-Anesthesia Assessment:                           - Prior to the procedure, a History and Physical                            was performed, and patient medications and                            allergies were reviewed. The patient's tolerance of                            previous anesthesia was also reviewed. The risks                            and benefits of the procedure and the sedation                            options and risks were discussed with the patient.                            All questions were answered, and informed consent                            was obtained. Prior Anticoagulants: The patient has                            taken no previous anticoagulant or antiplatelet                            agents. ASA Grade Assessment: II - A patient with                            mild systemic disease. After reviewing the risks                            and benefits, the patient was deemed in  satisfactory condition to undergo the procedure.                           After obtaining informed consent, the colonoscope                            was passed under direct vision. Throughout the                            procedure, the patient's blood pressure, pulse, and                            oxygen saturations were monitored continuously. The                            Model CF-HQ190L  [id number]) scope was introduced                            through the anus and advanced to the the cecum,                            identified by appendiceal orifice and ileocecal                            valve. The colonoscopy was performed without                            difficulty. The patient tolerated the procedure                            well. The quality of the bowel preparation was                            excellent. The ileocecal valve, appendiceal                            orifice, and rectum were photographed. Scope In: 9:19:06 AM Scope Out: 9:29:34 AM Scope Withdrawal Time: 0 hours 6 minutes 37 seconds  Total Procedure Duration: 0 hours 10 minutes 28 seconds  Findings:                 The entire examined colon appeared normal on direct                            and retroflexion views.                           No polyps or cancers. Complications:            No immediate complications. Estimated blood loss:                            None. Estimated Blood Loss:     Estimated blood loss: none. Impression:               - The entire examined colon is normal on  direct and                            retroflexion views.                           - No specimens collected. Recommendation:           - Patient has a contact number available for                            emergencies. The signs and symptoms of potential                            delayed complications were discussed with the                            patient. Return to normal activities tomorrow.                            Written discharge instructions were provided to the                            patient.                           - Resume previous diet.                           - Continue present medications.                           - Repeat colonoscopy in 10 years for screening                            purposes. Milus Banister, MD 07/31/2016 9:38:13 AM This report has been signed  electronically.

## 2016-07-31 NOTE — Progress Notes (Signed)
Spontaneous respirations throughout. VSS. Resting comfortably. To PACU on room air. Report to  [employer].

## 2016-07-31 NOTE — Patient Instructions (Signed)
YOU HAD AN ENDOSCOPIC PROCEDURE TODAY AT THE [facility]:   Refer to the procedure report that was given to you for any specific questions about what was found during the examination.  If the procedure report does not answer your questions, please call your gastroenterologist to clarify.  If you requested that your care partner not be given the details of your procedure findings, then the procedure report has been included in a sealed envelope for you to review at your convenience later.  YOU SHOULD EXPECT: Some feelings of bloating in the abdomen. Passage of more gas than usual.  Walking can help get rid of the air that was put into your GI tract during the procedure and reduce the bloating. If you had a lower endoscopy (such as a colonoscopy or flexible sigmoidoscopy) you may notice spotting of blood in your stool or on the toilet paper. If you underwent a bowel prep for your procedure, you may not have a normal bowel movement for a few days.  Please Note:  You might notice some irritation and congestion in your nose or some drainage.  This is from the oxygen used during your procedure.  There is no need for concern and it should clear up in a day or so.  SYMPTOMS TO REPORT IMMEDIATELY:   Following lower endoscopy (colonoscopy or flexible sigmoidoscopy):  Excessive amounts of blood in the stool  Significant tenderness or worsening of abdominal pains  Swelling of the abdomen that is new, acute  Fever of 100F or higher  For urgent or emergent issues, a gastroenterologist can be reached at any hour by calling [phone].   DIET:  We do recommend a small meal at first, but then you may proceed to your regular diet.  Drink plenty of fluids but you should avoid alcoholic beverages for 24 hours.  ACTIVITY:  You should plan to take it easy for the rest of today and you should NOT DRIVE or use heavy machinery until tomorrow (because of the sedation medicines used during the test).     FOLLOW UP: Our staff will call the number listed on your records the next business day following your procedure to check on you and address any questions or concerns that you may have regarding the information given to you following your procedure. If we do not reach you, we will leave a message.  However, if you are feeling well and you are not experiencing any problems, there is no need to return our call.  We will assume that you have returned to your regular daily activities without incident.  If any biopsies were taken you will be contacted by phone or by letter within the next 1-3 weeks.  Please call us at [phone] if you have not heard about the biopsies in 3 weeks.    SIGNATURES/CONFIDENTIALITY: You and/or your care partner have signed paperwork which will be entered into your electronic medical record.  These signatures attest to the fact that that the information above on your After Visit Summary has been reviewed and is understood.  Full responsibility of the confidentiality of this discharge information lies with you and/or your care-partner. 

## 2016-07-31 NOTE — Progress Notes (Signed)
Pt's states no medical or surgical changes since previsit or office visit. 

## 2016-08-01 ENCOUNTER — Telehealth: Payer: Self-pay | Admitting: *Deleted

## 2016-08-01 NOTE — Telephone Encounter (Signed)
  Follow up Call-  Call back number 07/31/2016  Post procedure Call Back phone  # [phone]  Permission to leave phone message Yes  Some recent data might be hidden     Patient questions:  Do you have a fever, pain , or abdominal swelling? No. Pain Score  0 *  Have you tolerated food without any problems? Yes.    Have you been able to return to your normal activities? Yes.    Do you have any questions about your discharge instructions: Diet   No. Medications  No. Follow up visit  No.  Do you have questions or concerns about your Care? No.  Actions: * If pain score is 4 or above: No action needed, pain <4.

## 2016-11-15 DIAGNOSIS — H43812 Vitreous degeneration, left eye: Secondary | ICD-10-CM | POA: Diagnosis not present

## 2017-03-10 DIAGNOSIS — N132 Hydronephrosis with renal and ureteral calculous obstruction: Secondary | ICD-10-CM | POA: Diagnosis not present

## 2017-03-10 DIAGNOSIS — R1031 Right lower quadrant pain: Secondary | ICD-10-CM | POA: Diagnosis not present

## 2017-03-10 DIAGNOSIS — Z883 Allergy status to other anti-infective agents status: Secondary | ICD-10-CM | POA: Diagnosis not present

## 2017-03-10 DIAGNOSIS — N201 Calculus of ureter: Secondary | ICD-10-CM | POA: Diagnosis not present

## 2017-03-10 DIAGNOSIS — K769 Liver disease, unspecified: Secondary | ICD-10-CM | POA: Diagnosis not present

## 2017-03-10 DIAGNOSIS — K5730 Diverticulosis of large intestine without perforation or abscess without bleeding: Secondary | ICD-10-CM | POA: Diagnosis not present

## 2017-03-10 DIAGNOSIS — N23 Unspecified renal colic: Secondary | ICD-10-CM | POA: Diagnosis not present

## 2017-03-10 DIAGNOSIS — R112 Nausea with vomiting, unspecified: Secondary | ICD-10-CM | POA: Diagnosis not present

## 2017-03-10 DIAGNOSIS — R932 Abnormal findings on diagnostic imaging of liver and biliary tract: Secondary | ICD-10-CM | POA: Diagnosis not present

## 2017-03-10 DIAGNOSIS — R911 Solitary pulmonary nodule: Secondary | ICD-10-CM | POA: Diagnosis not present

## 2017-03-10 DIAGNOSIS — Z87442 Personal history of urinary calculi: Secondary | ICD-10-CM | POA: Diagnosis not present

## 2017-04-11 DIAGNOSIS — R911 Solitary pulmonary nodule: Secondary | ICD-10-CM | POA: Diagnosis not present

## 2017-04-12 ENCOUNTER — Other Ambulatory Visit: Payer: Self-pay | Admitting: Family Medicine

## 2017-04-12 DIAGNOSIS — R911 Solitary pulmonary nodule: Secondary | ICD-10-CM

## 2017-04-16 ENCOUNTER — Ambulatory Visit
Admission: RE | Admit: 2017-04-16 | Discharge: 2017-04-16 | Disposition: A | Discharge status: Home / Self Care | Payer: BLUE CROSS/BLUE SHIELD | Source: Ambulatory Visit | Attending: Family Medicine | Admitting: Family Medicine

## 2017-04-16 DIAGNOSIS — R911 Solitary pulmonary nodule: Secondary | ICD-10-CM | POA: Diagnosis not present

## 2017-05-01 DIAGNOSIS — M25562 Pain in left knee: Secondary | ICD-10-CM | POA: Diagnosis not present

## 2017-05-30 DIAGNOSIS — M25562 Pain in left knee: Secondary | ICD-10-CM | POA: Diagnosis not present

## 2017-06-05 DIAGNOSIS — S83242A Other tear of medial meniscus, current injury, left knee, initial encounter: Secondary | ICD-10-CM | POA: Diagnosis not present

## 2017-08-22 DIAGNOSIS — E559 Vitamin D deficiency, unspecified: Secondary | ICD-10-CM | POA: Diagnosis not present

## 2017-08-22 DIAGNOSIS — E280 Estrogen excess: Secondary | ICD-10-CM | POA: Diagnosis not present

## 2017-08-22 DIAGNOSIS — R5383 Other fatigue: Secondary | ICD-10-CM | POA: Diagnosis not present

## 2017-08-22 DIAGNOSIS — N943 Premenstrual tension syndrome: Secondary | ICD-10-CM | POA: Diagnosis not present

## 2017-08-22 DIAGNOSIS — I341 Nonrheumatic mitral (valve) prolapse: Secondary | ICD-10-CM | POA: Diagnosis not present

## 2017-08-22 DIAGNOSIS — R635 Abnormal weight gain: Secondary | ICD-10-CM | POA: Diagnosis not present

## 2017-08-22 DIAGNOSIS — L920 Granuloma annulare: Secondary | ICD-10-CM | POA: Diagnosis not present

## 2017-10-03 DIAGNOSIS — Z01419 Encounter for gynecological examination (general) (routine) without abnormal findings: Secondary | ICD-10-CM | POA: Diagnosis not present

## 2017-10-04 DIAGNOSIS — Z01419 Encounter for gynecological examination (general) (routine) without abnormal findings: Secondary | ICD-10-CM | POA: Diagnosis not present

## 2017-10-04 DIAGNOSIS — Z6824 Body mass index (BMI) 24.0-24.9, adult: Secondary | ICD-10-CM | POA: Diagnosis not present

## 2017-10-07 ENCOUNTER — Other Ambulatory Visit: Payer: Self-pay | Admitting: Family Medicine

## 2017-10-07 DIAGNOSIS — R911 Solitary pulmonary nodule: Secondary | ICD-10-CM

## 2017-10-10 ENCOUNTER — Ambulatory Visit
Admission: RE | Admit: 2017-10-10 | Discharge: 2017-10-10 | Disposition: A | Discharge status: Home / Self Care | Payer: BLUE CROSS/BLUE SHIELD | Source: Ambulatory Visit | Attending: Family Medicine | Admitting: Family Medicine

## 2017-10-10 DIAGNOSIS — R911 Solitary pulmonary nodule: Secondary | ICD-10-CM

## 2017-10-25 DIAGNOSIS — L920 Granuloma annulare: Secondary | ICD-10-CM | POA: Diagnosis not present

## 2017-10-25 DIAGNOSIS — N943 Premenstrual tension syndrome: Secondary | ICD-10-CM | POA: Diagnosis not present

## 2017-10-25 DIAGNOSIS — E280 Estrogen excess: Secondary | ICD-10-CM | POA: Diagnosis not present

## 2017-10-25 DIAGNOSIS — I341 Nonrheumatic mitral (valve) prolapse: Secondary | ICD-10-CM | POA: Diagnosis not present

## 2017-10-30 DIAGNOSIS — R42 Dizziness and giddiness: Secondary | ICD-10-CM | POA: Diagnosis not present

## 2018-01-06 DIAGNOSIS — I83813 Varicose veins of bilateral lower extremities with pain: Secondary | ICD-10-CM | POA: Diagnosis not present

## 2018-01-17 DIAGNOSIS — I8312 Varicose veins of left lower extremity with inflammation: Secondary | ICD-10-CM | POA: Diagnosis not present

## 2018-01-17 DIAGNOSIS — I8311 Varicose veins of right lower extremity with inflammation: Secondary | ICD-10-CM | POA: Diagnosis not present

## 2018-01-29 DIAGNOSIS — I83813 Varicose veins of bilateral lower extremities with pain: Secondary | ICD-10-CM | POA: Diagnosis not present

## 2018-02-19 DIAGNOSIS — Z13220 Encounter for screening for lipoid disorders: Secondary | ICD-10-CM | POA: Diagnosis not present

## 2018-02-19 DIAGNOSIS — Z0000 Encounter for general adult medical examination without abnormal findings: Secondary | ICD-10-CM | POA: Diagnosis not present

## 2018-11-17 ENCOUNTER — Other Ambulatory Visit: Payer: Self-pay

## 2018-11-17 ENCOUNTER — Ambulatory Visit (HOSPITAL_COMMUNITY)
Admission: RE | Admit: 2018-11-17 | Discharge: 2018-11-17 | Disposition: A | Discharge status: Home / Self Care | Payer: BC Managed Care – PPO | Source: Ambulatory Visit | Attending: Family Medicine | Admitting: Family Medicine

## 2018-11-17 ENCOUNTER — Other Ambulatory Visit (HOSPITAL_COMMUNITY): Payer: Self-pay | Admitting: Family Medicine

## 2018-11-17 DIAGNOSIS — R52 Pain, unspecified: Secondary | ICD-10-CM | POA: Diagnosis not present

## 2018-11-17 NOTE — Progress Notes (Signed)
RLE venous duplex       has been completed. Preliminary results can be found under CV proc through chart review. June Leap, BS, RDMS, RVT  Called results to Dr. Thompson Caul office. Results and instructions given to patient.

## 2020-10-17 ENCOUNTER — Other Ambulatory Visit: Payer: Self-pay | Admitting: Radiology

## 2020-10-17 DIAGNOSIS — N6310 Unspecified lump in the right breast, unspecified quadrant: Secondary | ICD-10-CM

## 2020-10-20 ENCOUNTER — Ambulatory Visit
Admission: RE | Admit: 2020-10-20 | Discharge: 2020-10-20 | Disposition: A | Discharge status: Home / Self Care | Payer: BC Managed Care – PPO | Source: Ambulatory Visit | Attending: Radiology | Admitting: Radiology

## 2020-10-20 ENCOUNTER — Other Ambulatory Visit: Payer: Self-pay | Admitting: Radiology

## 2020-10-20 ENCOUNTER — Ambulatory Visit
Admission: RE | Admit: 2020-10-20 | Discharge: 2020-10-20 | Disposition: A | Discharge status: Home / Self Care | Payer: 59 | Source: Ambulatory Visit | Attending: Radiology | Admitting: Radiology

## 2020-10-20 ENCOUNTER — Other Ambulatory Visit: Payer: Self-pay

## 2020-10-20 DIAGNOSIS — N6310 Unspecified lump in the right breast, unspecified quadrant: Secondary | ICD-10-CM

## 2020-10-24 ENCOUNTER — Ambulatory Visit
Admission: RE | Admit: 2020-10-24 | Discharge: 2020-10-24 | Disposition: A | Discharge status: Home / Self Care | Payer: 59 | Source: Ambulatory Visit | Attending: Radiology | Admitting: Radiology

## 2020-10-24 ENCOUNTER — Other Ambulatory Visit: Payer: Self-pay | Admitting: Radiology

## 2020-10-24 ENCOUNTER — Other Ambulatory Visit: Payer: Self-pay

## 2020-10-24 DIAGNOSIS — N6310 Unspecified lump in the right breast, unspecified quadrant: Secondary | ICD-10-CM

## 2020-10-25 ENCOUNTER — Telehealth: Payer: Self-pay | Admitting: Hematology and Oncology

## 2020-10-25 NOTE — Telephone Encounter (Signed)
Scheduled appt per 10/4 staff msg from [facility]. Pt is aware of appt date and time.

## 2020-10-27 ENCOUNTER — Ambulatory Visit
Admission: RE | Admit: 2020-10-27 | Discharge: 2020-10-27 | Disposition: A | Discharge status: Home / Self Care | Payer: 59 | Source: Ambulatory Visit | Attending: Radiology | Admitting: Radiology

## 2020-10-27 ENCOUNTER — Other Ambulatory Visit: Payer: Self-pay | Admitting: Radiology

## 2020-10-27 ENCOUNTER — Other Ambulatory Visit: Payer: Self-pay

## 2020-10-27 DIAGNOSIS — N6310 Unspecified lump in the right breast, unspecified quadrant: Secondary | ICD-10-CM

## 2020-10-31 NOTE — Progress Notes (Signed)
[facility] CONSULT NOTE  Patient Care Team: [name], MD as PCP - General (Family Medicine)  CHIEF COMPLAINTS/PURPOSE OF CONSULTATION:  Newly diagnosed bilateral breast cancer  HISTORY OF PRESENTING ILLNESS:  Kaitlin [name] 45 y.o. female is here because of recent diagnosis of bilateral breast cancer. She has a mass palpable upon physical exam. Diagnostic mammogram and [place] on 10/20/2020 showed 3.3 cm mass in the 9:30 o'clock position, 5 mm probable satellite malignancy 9 mm inferomedial to the larger mass in the 9:30 o'clock position of the right breast, and multiple groups of calcifications in the upper outer, upper inner and lower inner quadrants of the right breast; a 2.5 cm group of indeterminate calcifications in upper-outer quadrant of the left breast. 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 She presents to the clinic today for initial evaluation and discussion of treatment options.   I reviewed her records extensively and collaborated the history with the patient.  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%, PR [place], Ki-67 2%, HER2 negative Right breast biopsy LIQ: Grade 1 IDC with DCIS Left breast biopsy UOQ: DCIS intermediate grade      MEDICAL HISTORY:  Past Medical History:  Diagnosis Date   Allergy    Anemia    Asthma    Cardiomyopathy ([place])    Chronic headaches    Delivery normal nov.2010   Heart murmur    Kidney stone    x of   S/P tonsillectomy    2000    SURGICAL HISTORY: Past Surgical History:  Procedure Laterality Date   tonsil removal  2000    SOCIAL HISTORY: Social History   Socioeconomic History   Marital status: Married    Spouse name: Not on file   Number of children: 1   Years of education: Not on  file   Highest education level: Not on file  Occupational History   Occupation: Housewife  Tobacco Use   Smoking status: Former    Types: Cigarettes   Smokeless tobacco: Never  Substance and Sexual Activity   Alcohol use: Yes    Comment: occasional wine   Drug use: No   Sexual activity: Not on file  Other Topics Concern   Not on file  Social History Narrative   Not on file   Social Determinants of Health   Financial Resource Strain: Not on file  Food Insecurity: Not on file  Transportation Needs: Not on file  Physical Activity: Not on file  Stress: Not on file  Social Connections: Not on file  Intimate Partner Violence: Not on file    FAMILY HISTORY: Family History  Problem Relation Age of Onset   Memory loss Mother    Ulcerative colitis Mother    Colon polyps Father    Irritable bowel syndrome Sister    Ovarian cancer Maternal Grandmother    Uterine cancer Maternal Grandmother    Breast cancer Paternal Aunt 66   Colon cancer Paternal Aunt    Prostate cancer Paternal Uncle    Colon polyps Paternal Uncle    Esophageal cancer Neg Hx    Rectal cancer Neg Hx    Stomach cancer Neg Hx     ALLERGIES:  is allergic to ceclor [cefaclor].  MEDICATIONS:  Current Outpatient Medications  Medication Sig  Dispense Refill   Multiple Vitamin (MULTIVITAMIN) tablet Take 1 tablet by mouth daily.     OVER THE COUNTER MEDICATION Raw Vitamin D 3. 2000 mg 1 tablet daily     OVER THE COUNTER MEDICATION Triphala, 1 tablet daily     OVER THE COUNTER MEDICATION Iron supplement. 30 mg daily.     OVER THE COUNTER MEDICATION Elderberry Syrup. 30 ml daily.     OVER THE COUNTER MEDICATION Probiotic 1 capsule daily     Current Facility-Administered Medications  Medication Dose Route Frequency Provider Last Rate Last Admin   0.9 %  sodium chloride infusion  500 mL Intravenous Continuous Milus Banister, MD        REVIEW OF SYSTEMS:   Constitutional: Denies fevers, chills or abnormal  night sweats   All other systems were reviewed with the patient and are negative.  PHYSICAL EXAMINATION: ECOG PERFORMANCE STATUS: 0 - Asymptomatic  Vitals:   11/01/20 1304  BP: 121/61  Pulse: 75  Resp: 18  Temp: 98.1 F (36.7 C)  SpO2: 100%   Filed Weights   11/01/20 1304  Weight: 149 lb 3.2 oz (67.7 kg)      LABORATORY DATA:  I have reviewed the data as listed Lab Results  Component Value Date   WBC 17.7 (H) 03/21/2012   HGB 11.4 (L) 03/21/2012   HCT 33.4 (L) 03/21/2012   MCV 91.0 03/21/2012   PLT 267 03/21/2012   Lab Results  Component Value Date   NA 140 04/06/2011   K 4.4 04/06/2011   CL 107 04/06/2011   CO2 28 04/06/2011    RADIOGRAPHIC STUDIES: I have personally reviewed the radiological reports and agreed with the findings in the report.  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 After reviewing the pathology in detail, we proceeded to discuss the different treatment options between surgery, radiation, chemotherapy, antiestrogen therapies.  Recommendations: 1.  Bilateral mastectomies with reconstruction 2. Oncotype DX testing to determine if chemotherapy would be of any benefit followed by 3. Adjuvant antiestrogen therapy  Oncotype counseling: I discussed Oncotype DX test. I explained to the patient that this is a 21 gene panel to evaluate  patient tumors DNA to calculate recurrence score. This would help determine whether patient has high risk or low risk breast cancer. She understands that if her tumor was found to be high risk, she would benefit from systemic chemotherapy. If low risk, no need of chemotherapy.  Patient has a part-time business of developing vitamin/nutritional supplements.  Therefore she takes a lot of the supplements herself. 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 Her husband works in the [employer].  Return to clinic after surgery to discuss final pathology report and then determine if Oncotype DX testing will need to be sent.   All questions were answered. The patient knows to call the clinic with  any problems, questions or concerns.   Rulon Eisenmenger, MD, MPH 11/01/2020    I, Thana Ates, am acting as scribe for [name], MD.  I have reviewed the above documentation for accuracy and completeness, and I agree with the above.

## 2020-11-01 ENCOUNTER — Inpatient Hospital Stay: Payer: 59 | Attending: Hematology and Oncology | Admitting: Hematology and Oncology

## 2020-11-01 ENCOUNTER — Other Ambulatory Visit: Payer: Self-pay

## 2020-11-01 DIAGNOSIS — Z8371 Family history of colonic polyps: Secondary | ICD-10-CM

## 2020-11-01 DIAGNOSIS — Z803 Family history of malignant neoplasm of breast: Secondary | ICD-10-CM

## 2020-11-01 DIAGNOSIS — Z8049 Family history of malignant neoplasm of other genital organs: Secondary | ICD-10-CM | POA: Diagnosis not present

## 2020-11-01 DIAGNOSIS — Z818 Family history of other mental and behavioral disorders: Secondary | ICD-10-CM | POA: Diagnosis not present

## 2020-11-01 DIAGNOSIS — Z800 Family history of malignant neoplasm of digestive organs: Secondary | ICD-10-CM

## 2020-11-01 DIAGNOSIS — Z170 Estrogen receptor positive status [ER+]: Secondary | ICD-10-CM

## 2020-11-01 DIAGNOSIS — Z87891 Personal history of nicotine dependence: Secondary | ICD-10-CM | POA: Diagnosis not present

## 2020-11-01 DIAGNOSIS — Z8041 Family history of malignant neoplasm of ovary: Secondary | ICD-10-CM | POA: Diagnosis not present

## 2020-11-01 DIAGNOSIS — C50911 Malignant neoplasm of unspecified site of right female breast: Secondary | ICD-10-CM | POA: Insufficient documentation

## 2020-11-01 DIAGNOSIS — Z8042 Family history of malignant neoplasm of prostate: Secondary | ICD-10-CM | POA: Diagnosis not present

## 2020-11-01 DIAGNOSIS — C50912 Malignant neoplasm of unspecified site of left female breast: Secondary | ICD-10-CM | POA: Insufficient documentation

## 2020-11-01 DIAGNOSIS — Z79899 Other long term (current) drug therapy: Secondary | ICD-10-CM | POA: Diagnosis not present

## 2020-11-01 DIAGNOSIS — Z8379 Family history of other diseases of the digestive system: Secondary | ICD-10-CM

## 2020-11-01 DIAGNOSIS — C50411 Malignant neoplasm of upper-outer quadrant of right female breast: Secondary | ICD-10-CM | POA: Diagnosis present

## 2020-11-01 MED ORDER — TAMOXIFEN CITRATE 20 MG PO TABS: 20.0000 mg | ORAL_TABLET | Freq: Every day | ORAL | 0 refills | Status: DC

## 2020-11-01 NOTE — Assessment & Plan Note (Signed)
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 After reviewing the pathology in detail, we proceeded to discuss the different treatment options between surgery, radiation, chemotherapy, antiestrogen therapies.  Recommendations: 1.  Bilateral mastectomies 2. Oncotype DX testing to determine if chemotherapy would be of any benefit followed by 3. Adjuvant antiestrogen therapy  Oncotype counseling: I discussed Oncotype DX test. I explained to the patient that this is a 21 gene panel to evaluate patient tumors DNA to calculate recurrence score. This would help determine whether patient has high risk or low risk breast cancer. She understands that if her tumor was found to be high risk, she would benefit from systemic chemotherapy. If low risk, no need of chemotherapy.  Return to clinic after surgery to discuss final pathology report and then determine if Oncotype DX testing will need to be sent.

## 2020-11-02 ENCOUNTER — Other Ambulatory Visit: Payer: Self-pay | Admitting: Genetic Counselor

## 2020-11-02 ENCOUNTER — Inpatient Hospital Stay: Payer: 59

## 2020-11-02 ENCOUNTER — Encounter: Payer: Self-pay | Admitting: Genetic Counselor

## 2020-11-02 ENCOUNTER — Inpatient Hospital Stay (HOSPITAL_BASED_OUTPATIENT_CLINIC_OR_DEPARTMENT_OTHER): Payer: 59 | Admitting: Genetic Counselor

## 2020-11-02 DIAGNOSIS — Z808 Family history of malignant neoplasm of other organs or systems: Secondary | ICD-10-CM | POA: Insufficient documentation

## 2020-11-02 DIAGNOSIS — Z800 Family history of malignant neoplasm of digestive organs: Secondary | ICD-10-CM

## 2020-11-02 DIAGNOSIS — C50912 Malignant neoplasm of unspecified site of left female breast: Secondary | ICD-10-CM

## 2020-11-02 DIAGNOSIS — Z170 Estrogen receptor positive status [ER+]: Secondary | ICD-10-CM | POA: Diagnosis not present

## 2020-11-02 DIAGNOSIS — C50911 Malignant neoplasm of unspecified site of right female breast: Secondary | ICD-10-CM

## 2020-11-02 DIAGNOSIS — Z8041 Family history of malignant neoplasm of ovary: Secondary | ICD-10-CM

## 2020-11-02 DIAGNOSIS — Z803 Family history of malignant neoplasm of breast: Secondary | ICD-10-CM

## 2020-11-02 DIAGNOSIS — Z806 Family history of leukemia: Secondary | ICD-10-CM | POA: Insufficient documentation

## 2020-11-02 LAB — GENETIC SCREENING ORDER

## 2020-11-02 NOTE — Progress Notes (Signed)
REFERRING PROVIDER: [name], MD [address],  [place] [address]  PRIMARY PROVIDER:  [name], MD  PRIMARY REASON FOR VISIT:  1. Bilateral malignant neoplasm of breast in female, estrogen receptor positive, unspecified site of breast ([place])   2. Family history of breast cancer   3. Family history of colon cancer   4. Family history of leukemia   5. Family history of melanoma   6. Family history of ovarian cancer      HISTORY OF PRESENT ILLNESS:   Kaitlin [name], a 45 y.o. female, was seen for a [place] cancer genetics consultation at the request of Dr. Lindi Adie due to a personal and family history of cancer.  Kaitlin [name] presents to clinic today to discuss the possibility of a hereditary predisposition to cancer, genetic testing, and to further clarify her future cancer risks, as well as potential cancer risks for family members.   In October 2022, at the age of 49, Kaitlin [name] was diagnosed with bilateral cancer of the breasts.  The treatment plan includes a double mastectomy and possible radiation.  She will start taking tamoxifen today.    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  First live birth at age 63.  OCP use for approximately  13  years.  Ovaries intact: yes.  Hysterectomy: no.  Menopausal status: premenopausal.  HRT use: 0 years. Colonoscopy: yes;  1 polyp . Mammogram within the last year:  yes. Number of breast biopsies: 2. Up to date with pelvic exams: yes. Any excessive radiation exposure in the past: no  Past Medical History:  Diagnosis Date   Allergy    Anemia    Asthma    Cardiomyopathy ([place])    Chronic headaches    Delivery normal 11/2008   Family history of breast cancer    Family history of colon cancer    Family history of leukemia    Family history of melanoma    Family history of ovarian cancer    Heart murmur    Kidney stone    x of   S/P tonsillectomy    2000    Past Surgical History:  Procedure Laterality Date   tonsil removal  2000    Social History   Socioeconomic History   Marital status: Married    Spouse name: Not on file   Number of children: 1   Years of education: Not on file   Highest education level: Not on file  Occupational History   Occupation: Housewife  Tobacco Use   Smoking status: Former    Types: Cigarettes   Smokeless tobacco: Never  Substance and Sexual Activity   Alcohol use: Yes    Comment: occasional wine   Drug use: No   Sexual activity: Not on file  Other Topics Concern   Not on file  Social History Narrative   Not on file   Social Determinants of  Health   Financial Resource Strain: Not on file  Food Insecurity: Not on file  Transportation Needs: Not on file  Physical Activity: Not on file  Stress: Not on file  Social Connections: Not on file     FAMILY HISTORY:  We obtained a detailed, 4-generation family history.  Significant diagnoses are listed below: Family History  Problem Relation Age of Onset   Memory loss Mother    Ulcerative colitis Mother    Colon polyps Father    Irritable bowel syndrome Sister    Leukemia Maternal Aunt 53       AML   Melanoma Maternal Aunt 16   Breast cancer Paternal Aunt 78   Colon cancer Paternal Aunt    Leukemia Paternal Aunt 15   Prostate cancer Paternal Uncle 71   Colon polyps Paternal Uncle    Cervical cancer Maternal Grandmother    Parkinson's  disease Maternal Grandmother    Alzheimer's disease Maternal Grandmother    Multiple myeloma Maternal Grandfather    Skin cancer Maternal Grandfather    Skin cancer Paternal Grandmother    Colon cancer Paternal Grandfather 86   Stroke Paternal Grandfather    Ovarian cancer Other 19       MGMs sister   Esophageal cancer Neg Hx    Rectal cancer Neg Hx    Stomach cancer Neg Hx      The patient has two sons who are cancer free.  She has a full sister and adopted sister.  Both are cancer free.  Both parents are living.  The patient's mother has fibrocystic breast disease.  She has two sisters and a brother.  One sister had melanoma at 66 and AML in her 66's.  Her brother died in his 43's.  The maternal grandparents are deceased.  The grandfather died of multiple myeloma, and the grandmother had cervical cancer.  She had three sisters, two had cervical cancer in their 39's and one had ovarian cancer in her 51's.  The patient's father is 5.  He has three brothers and a sister.  One brother has prostate cancer and his sister had breast cancer at 86, colon cancer and leukemia at 19.  The paternal grandparents are deceased.  The grandfather had colon cancer at 49.  Kaitlin [name] is unaware of previous family history of genetic testing for hereditary cancer risks. Patient's maternal ancestors are of caucasian descent, and paternal ancestors are of [place] and [place] descent. There is no reported Ashkenazi Jewish ancestry. There is no known consanguinity.  GENETIC COUNSELING ASSESSMENT: Kaitlin [name] is a 45 y.o. female with a personal and family history of cancer which is somewhat suggestive of a hereditary cancer syndrome and predisposition to cancer given the combination of cancer and young ages of onset. We, therefore, discussed and recommended the following at today's visit.   DISCUSSION: We discussed that, in general, most cancer is not inherited in families, but instead is sporadic or familial. Sporadic  cancers occur by chance and typically happen at older ages (>50 years) as this type of cancer is caused by genetic changes acquired during an individual's lifetime. Some families have more cancers than would be expected by chance; however, the ages or types of cancer are not consistent with a known genetic mutation or known genetic mutations have been ruled out. This type of familial cancer is thought to be due to a combination of multiple genetic, environmental, hormonal, and lifestyle factors. While this combination of factors likely increases the risk of  cancer, the exact source of this risk is not currently identifiable or testable.  We discussed that 5 - 10% of breast cancer is hereditary, with most cases associated with BRCA mutations.  There are other genes that can be associated with hereditary breast cancer syndromes.  These include ATM, CHEK2 and PALB2.  We discussed that testing is beneficial for several reasons including knowing how to follow individuals after completing their treatment, identifying whether potential treatment options such as PARP inhibitors would be beneficial, and understand if other family members could be at risk for cancer and allow them to undergo genetic testing.   We reviewed the characteristics, features and inheritance patterns of hereditary cancer syndromes. We also discussed genetic testing, including the appropriate family members to test, the process of testing, insurance coverage and turn-around-time for results. We discussed the implications of a negative, positive, carrier and/or variant of uncertain significant result. We recommended Kaitlin [name] pursue genetic testing for the CustomNext-Cancer+RNAinsight gene panel.  The CustomNext-Cancer gene panel offered by [facility] and includes sequencing and rearrangement analysis for the following 91 genes: AIP, ALK, APC*, ATM*, AXIN2, BAP1, BARD1, BLM, BMPR1A, BRCA1*, BRCA2*, BRIP1*, CDC73, CDH1*, CDK4, CDKN1B, CDKN2A,  CHEK2*, CTNNA1, DICER1, FANCC, FH, FLCN, GALNT12, KIF1B, LZTR1, MAX, MEN1, MET, MLH1*, MRE11A, MSH2*, MSH3, MSH6*, MUTYH*, NBN, NF1*, NF2, NTHL1, PALB2*, PHOX2B, PMS2*, POT1, PRKAR1A, PTCH1, PTEN*, RAD50, RAD51C*, RAD51D*, RB1, RECQL, RET, SDHA, SDHAF2, SDHB, SDHC, SDHD, SMAD4, SMARCA4, SMARCB1, SMARCE1, STK11, SUFU, TMEM127, TP53*, TSC1, TSC2, VHL and XRCC2 (sequencing and deletion/duplication); CASR, CFTR, CPA1, CTRC, EGFR, EGLN1, FAM175A, HOXB13, KIT, MITF, MLH3, PALLD, PDGFRA, POLD1, POLE, PRSS1, RINT1, RPS20, SPINK1 and TERT (sequencing only); EPCAM and GREM1 (deletion/duplication only). DNA and RNA analyses performed for * genes.   Based on Kaitlin [name]'s personal and family history of cancer, she meets medical criteria for genetic testing. Despite that she meets criteria, she may still have an out of pocket cost. We discussed that if her out of pocket cost for testing is over $100, the laboratory will call and confirm whether she wants to proceed with testing.  If the out of pocket cost of testing is less than $100 she will be billed by the genetic testing laboratory.   PLAN: After considering the risks, benefits, and limitations, Kaitlin [name] provided informed consent to pursue genetic testing and the blood sample was sent to [facility] for analysis of the CustomNext-Cancer+RNAinsight. Results should be available within approximately 2-3 weeks' time, at which point they will be disclosed by telephone to Kaitlin [name], as will any additional recommendations warranted by these results. Kaitlin [name] will receive a summary of her genetic counseling visit and a copy of her results once available. This information will also be available in Epic.   Lastly, we encouraged Kaitlin [name] to remain in contact with cancer genetics annually so that we can continuously update the family history and inform her of any changes in cancer genetics and testing that may be of benefit for this family.   Kaitlin [name]  questions were answered to her satisfaction today. Our contact information was provided should additional questions or concerns arise. Thank you for the referral and allowing [place] to share in the care of your patient.   Berry Gallacher P. [place], [place], [facility] Licensed, Insurance risk surveyor [email or web address] phone: [phone]  The patient was seen for a total of 35 minutes in face-to-face genetic counseling.  The patient brought her husband. This patient was discussed with Drs. Magrinat, Lindi Adie and/or Burr Medico who agrees with  the above.    _______________________________________________________________________ For Office Staff:  Number of people involved in session: 2 Was an Intern/ student involved with case: no

## 2020-11-03 ENCOUNTER — Telehealth: Payer: Self-pay | Admitting: *Deleted

## 2020-11-03 ENCOUNTER — Encounter: Payer: Self-pay | Admitting: *Deleted

## 2020-11-03 NOTE — Telephone Encounter (Signed)
Left message to follow up from new patient appt and assess navigation needs. Left contact information on voicemail.

## 2020-11-08 ENCOUNTER — Ambulatory Visit: Payer: 59 | Attending: General Surgery | Admitting: Rehabilitation

## 2020-11-08 ENCOUNTER — Encounter: Payer: Self-pay | Admitting: Rehabilitation

## 2020-11-08 ENCOUNTER — Other Ambulatory Visit: Payer: Self-pay

## 2020-11-08 ENCOUNTER — Encounter: Payer: Self-pay | Admitting: *Deleted

## 2020-11-08 DIAGNOSIS — C50911 Malignant neoplasm of unspecified site of right female breast: Secondary | ICD-10-CM | POA: Diagnosis present

## 2020-11-08 DIAGNOSIS — Z170 Estrogen receptor positive status [ER+]: Secondary | ICD-10-CM | POA: Insufficient documentation

## 2020-11-08 DIAGNOSIS — R293 Abnormal posture: Secondary | ICD-10-CM | POA: Insufficient documentation

## 2020-11-08 DIAGNOSIS — C50912 Malignant neoplasm of unspecified site of left female breast: Secondary | ICD-10-CM | POA: Diagnosis present

## 2020-11-08 NOTE — Patient Instructions (Signed)
Physical Therapy Information for After Breast Cancer Surgery/Treatment:  Lymphedema is a swelling condition that you may be at risk for in your arm if you have lymph nodes removed from the armpit area.  After a sentinel node biopsy, the risk is approximately 5-9% and is higher after an axillary node dissection.  There is treatment available for this condition and it is not life-threatening.  Contact your physician or physical therapist with concerns. You may begin the 4 shoulder/posture exercises (see additional sheet) when permitted by your physician (typically a week after surgery).  If you have drains, you may need to wait until those are removed before beginning range of motion exercises.  A general recommendation is to not lift your arms above shoulder height until drains are removed.  These exercises should be done to your tolerance and gently.  This is not a "no pain/no gain" type of recovery so listen to your body and stretch into the range of motion that you can tolerate, stopping if you have pain.  If you are having immediate reconstruction, ask your plastic surgeon about doing exercises as he or she may want you to wait. We encourage you to attend the free one time ABC (After Breast Cancer) class offered by [facility].  You will learn information related to lymphedema risk, prevention and treatment and additional exercises to regain mobility following surgery.  You can call [phone] for more information.  This is offered the 1st and 3rd Monday of each month.  You only attend the class one time. While undergoing any medical procedure or treatment, try to avoid blood pressure being taken or needle sticks from occurring on the arm on the side of cancer.   This recommendation begins after surgery and continues for the rest of your life.  This may help reduce your risk of getting lymphedema (swelling in your arm). An excellent resource for those seeking information on  lymphedema is the National Lymphedema Network's web site. It can be accessed at [place].org If you notice swelling in your hand, arm or breast at any time following surgery (even if it is many years from now), please contact your doctor or physical therapist to discuss this.  Lymphedema can be treated at any time but it is easier for you if it is treated early on.  If you feel like your shoulder motion is not returning to normal in a reasonable amount of time, please contact your surgeon or physical therapist.  [facility] [place], [place], [place] [phone]; [address], [place], [place] [address] ABC CLASS After Breast Cancer Class  After Breast Cancer Class is a specially designed exercise class to assist you in a safe recover after having breast cancer surgery.  In this class you will learn how to get back to full function whether your drains were just removed or if you had surgery a month ago.  This class is FREE and space is limited. For more information or to register for the next available class, call [phone].  Class Goals  Understand specific stretches to improve the flexibility of you chest and shoulder. Learn ways to safely strengthen your upper body and improve your posture. Understand the warning signs of infection and why you may be at risk for an arm infection. Learn about Lymphedema and prevention.  ** You do not attend this class until after surgery.  Drains must be removed to participate  Patient was instructed today in a home exercise program today for  post op shoulder range of motion. These included active assist shoulder flexion in sitting, scapular retraction, wall walking with shoulder abduction, and hands behind head external rotation.  She was encouraged to do these twice a day, holding 3 seconds and repeating 5 times when permitted by her physician.

## 2020-11-08 NOTE — Therapy (Signed)
[facility] @ [place], [place], [address] Phone: [phone]   Fax:  [phone]  Physical Therapy Treatment  Patient Details  Name: Kaitlin [name] MRN: [id number] Date of Birth: Jun 12, 1975 Referring Provider (PT): Dr. Donne Hazel   Encounter Date: 11/08/2020   PT End of Session - 11/08/20 1447     Visit Number 1    Number of Visits 2    Date for PT Re-Evaluation 01/03/21    PT Start Time 6834    PT Stop Time 1444    PT Time Calculation (min) 40 min    Activity Tolerance Patient tolerated treatment well    Behavior During Therapy [facility] for tasks assessed/performed             Past Medical History:  Diagnosis Date   Allergy    Anemia    Asthma    Cardiomyopathy ([place])    Chronic headaches    Delivery normal 11/2008   Family history of breast cancer    Family history of colon cancer    Family history of leukemia    Family history of melanoma    Family history of ovarian cancer    Heart murmur    Kidney stone    x of   S/P tonsillectomy    2000    Past Surgical History:  Procedure Laterality Date   tonsil removal  2000    There were no vitals filed for this visit.   Subjective Assessment - 11/08/20 1407     Subjective Doing well.    Pertinent History Recent Diagnosis of bil breast cancer: Rt Grade 2 IDC with DCIS ER positive.  Lt breast DCIS intermediate.  Will be having bil mastectomy with oncotype testing to determine need for chemotherapy.  Radiation to follow. Will make a decision on reconstruction by Friday and then will have surgery scheduled.    Patient Stated Goals get info from providers    Currently in Pain? No/denies                [facility] PT Assessment - 11/08/20 0001       Assessment   Medical Diagnosis bil breast cancer    Referring Provider (PT) Dr. 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 - more counseling for full check    Leisure exercise and lifting group      Cognition   Overall Cognitive Status Within Functional Limits for tasks assessed      ROM / Strength   AROM / PROM / Strength AROM      AROM   AROM Assessment Site Shoulder    Right/Left Shoulder Right;Left    Right Shoulder Extension 53 Degrees    Right Shoulder Flexion 150 Degrees    Right Shoulder ABduction 165 Degrees  Right Shoulder External Rotation 105 Degrees    Left Shoulder Extension 40 Degrees    Left Shoulder Flexion 149 Degrees    Left Shoulder ABduction 165 Degrees    Left Shoulder External Rotation 90 Degrees               LYMPHEDEMA/ONCOLOGY QUESTIONNAIRE - 11/08/20 0001       Lymphedema Assessments   Lymphedema Assessments Upper extremities      Right Upper Extremity Lymphedema   15 cm Proximal to Olecranon Process 29.5 cm    Olecranon Process 22.5 cm    10 cm Proximal to Ulnar Styloid Process 19.5 cm    Just Proximal to Ulnar Styloid Process 27.5 cm    Across Hand at [employer] 23.5 cm    At [place] of 2nd Digit 5.5 cm             L-DEX FLOWSHEETS - 11/08/20 1400       L-DEX LYMPHEDEMA SCREENING   Measurement Type Unilateral    L-DEX MEASUREMENT EXTREMITY Upper Extremity    POSITION  Standing    DOMINANT SIDE Right    At Risk Side Right    BASELINE SCORE (UNILATERAL) -0.2                                PT Education - 11/08/20 1446     Education Details lymphedema  risk, POC, post op stretches and care, SOZO    Person(s) Educated Patient    Methods Explanation;Verbal cues;Handout    Comprehension Verbalized understanding                 PT Long Term Goals - 11/08/20 1454       PT LONG TERM GOAL #1   Title Pt will return to baseline AROM    Time 8    Period Weeks    Status New      PT LONG TERM GOAL #2   Title Pt will be scheduled for SOZO follow up    Time [facility] Goals - 11/08/20 1453       Patient will be able to verbalize understanding of pertinent lymphedema risk reduction practices relevant to her diagnosis specifically related to skin care.   Status Achieved      Patient will be able to return demonstrate and/or verbalize understanding of the post-op home exercise program related to regaining shoulder range of motion.   Status Achieved      Patient will be able to verbalize understanding of the importance of attending the postoperative After Breast Cancer Class for further lymphedema risk reduction education and therapeutic exercise.   Status Achieved                  Plan - 11/08/20 1448     Clinical Impression Statement Active female presents with bil breast cancer with pending bil mastectomy surgery of unknown date.  SLNB will be performed only on the Rt side.  Pt was educated on baseline reasons as well as PT and SOZO POC.  Discussion regarding post op stretches, and answered all questions about typical findings with no reconstruction vs reconstruction as pt still has to make this decision.    Personal Factors and Comorbidities Comorbidity 1    Comorbidities cancer    Stability/Clinical  Decision Making Stable/Uncomplicated    Clinical Decision Making Low    Rehab Potential Excellent    PT Frequency --   2 visits   PT Duration 8 weeks    PT Treatment/Interventions ADLs/Self Care Home Management;Therapeutic exercise;Patient/family education;Manual  techniques    PT Next Visit Plan post op check, schedule SOZO follow up, ABC class if wanted    PT Home Exercise Plan post op    Consulted and Agree with Plan of Care Patient             Patient will benefit from skilled therapeutic intervention in order to improve the following deficits and impairments:  Decreased knowledge of precautions, Postural dysfunction  Visit Diagnosis: Abnormal posture  Bilateral malignant neoplasm of breast in female, estrogen receptor positive, unspecified site of breast [facility])     Problem List Patient Active Problem List   Diagnosis Date Noted   Family history of breast cancer 11/02/2020   Family history of ovarian cancer 11/02/2020   Family history of colon cancer 11/02/2020   Family history of melanoma 11/02/2020   Family history of leukemia 11/02/2020   Bilateral breast cancer ([place]) 11/01/2020   LLQ abdominal pain 04/06/2011    Stark Bray, PT 11/08/2020, 2:55 PM  [facility] @ [place], [place], [address] Phone: [phone]   Fax:  [phone]  Name: ANICIA LEUTHOLD MRN: [id number] Date of Birth: Jul 03, 1975

## 2020-11-11 ENCOUNTER — Other Ambulatory Visit: Payer: Self-pay | Admitting: General Surgery

## 2020-11-15 ENCOUNTER — Encounter: Payer: Self-pay | Admitting: *Deleted

## 2020-11-16 ENCOUNTER — Telehealth: Payer: Self-pay | Admitting: Hematology and Oncology

## 2020-11-16 ENCOUNTER — Encounter: Payer: Self-pay | Admitting: *Deleted

## 2020-11-16 NOTE — Telephone Encounter (Signed)
Scheduled per sch msg. Called and left msg  

## 2020-11-17 ENCOUNTER — Telehealth: Payer: Self-pay | Admitting: Genetic Counselor

## 2020-11-17 ENCOUNTER — Encounter: Payer: Self-pay | Admitting: Genetic Counselor

## 2020-11-17 ENCOUNTER — Ambulatory Visit: Payer: Self-pay | Admitting: Genetic Counselor

## 2020-11-17 DIAGNOSIS — Z1379 Encounter for other screening for genetic and chromosomal anomalies: Secondary | ICD-10-CM

## 2020-11-17 NOTE — Progress Notes (Signed)
HPI:  Kaitlin [name] was previously seen in the [facility] clinic due to a personal and family history of cancer and concerns regarding a hereditary predisposition to cancer. Please refer to our prior cancer genetics clinic note for more information regarding our discussion, assessment and recommendations, at the time. Kaitlin [name] recent genetic test results were disclosed to her, as were recommendations warranted by these results. These results and recommendations are discussed in more detail below.  CANCER HISTORY:  Oncology History  Bilateral breast cancer ([place])  11/01/2020 Initial Diagnosis   Right breast biopsy UOQ 9:30 position: Grade 2 IDC with DCIS ER 100%, PR [place], Ki-67 2%, HER2 negative Right breast biopsy LIQ: Grade 1 IDC with DCIS Left breast biopsy UOQ: DCIS intermediate grade    11/01/2020 Cancer Staging   Staging form: Breast, AJCC 8th Edition - Clinical: Stage IIA (cT3, cN0, cM0, G2, ER+, PR+, HER2-) - Signed by [name], MD on 11/01/2020 Stage prefix: Initial diagnosis Histologic grading system: 3 grade system    11/16/2020 Genetic Testing   Negative genetic testing on the CancerNext-Expanded+RNAinsight.  The report date is 11/16/2020  The CancerNext-Expanded gene panel offered by [facility] and includes sequencing and rearrangement analysis for the following 77 genes: AIP, ALK, APC*, ATM*, AXIN2, BAP1, BARD1, BLM, BMPR1A, BRCA1*, BRCA2*, BRIP1*, CDC73, CDH1*, CDK4, CDKN1B, CDKN2A, CHEK2*, CTNNA1, DICER1, FANCC, FH, FLCN, GALNT12, KIF1B, LZTR1, MAX, MEN1, MET, MLH1*, MSH2*, MSH3, MSH6*, MUTYH*, NBN, NF1*, NF2, NTHL1, PALB2*, PHOX2B, PMS2*, POT1, PRKAR1A, PTCH1, PTEN*, RAD51C*, RAD51D*, RB1, RECQL, RET, SDHA, SDHAF2, SDHB, SDHC, SDHD, SMAD4, SMARCA4, SMARCB1, SMARCE1, STK11, SUFU, TMEM127, TP53*, TSC1, TSC2, VHL and XRCC2 (sequencing and deletion/duplication); EGFR, EGLN1, HOXB13, KIT, MITF, PDGFRA, POLD1, and POLE (sequencing only); EPCAM and GREM1  (deletion/duplication only). DNA and RNA analyses performed for * genes.      FAMILY HISTORY:  We obtained a detailed, 4-generation family history.  Significant diagnoses are listed below: Family History  Problem Relation Age of Onset   Memory loss Mother    Ulcerative colitis Mother    Colon polyps Father    Irritable bowel syndrome Sister    Leukemia Maternal Aunt 65       AML   Melanoma Maternal Aunt 16   Breast cancer Paternal Aunt 42   Colon cancer Paternal Aunt    Leukemia Paternal Aunt 18   Prostate cancer Paternal Uncle 11   Colon polyps Paternal Uncle    Cervical cancer Maternal Grandmother    Parkinson's disease Maternal Grandmother    Alzheimer's disease Maternal Grandmother    Multiple myeloma Maternal Grandfather    Skin cancer Maternal Grandfather    Skin cancer Paternal Grandmother    Colon cancer Paternal Grandfather 64   Stroke Paternal Grandfather    Ovarian cancer Other 20       MGMs sister   Esophageal cancer Neg Hx    Rectal cancer Neg Hx    Stomach cancer Neg Hx       The patient has two sons who are cancer free.  She has a full sister and adopted sister.  Both are cancer free.  Both parents are living.   The patient's mother has fibrocystic breast disease.  She has two sisters and a brother.  One sister had melanoma at 79 and AML in her 70's.  Her brother died in his 43's.  The maternal grandparents are deceased.  The grandfather died of multiple myeloma, and the grandmother had cervical cancer.  She had three sisters,  two had cervical cancer in their 30's and one had ovarian cancer in her 30's.   The patient's father is 28.  He has three brothers and a sister.  One brother has prostate cancer and his sister had breast cancer at 55, colon cancer and leukemia at 84.  The paternal grandparents are deceased.  The grandfather had colon cancer at 65.   Kaitlin [name] is unaware of previous family history of genetic testing for hereditary cancer risks. Patient's  maternal ancestors are of caucasian descent, and paternal ancestors are of [place] and [place] descent. There is no reported Ashkenazi Jewish ancestry. There is no known consanguinity.  GENETIC TEST RESULTS: Genetic testing reported out on November 16, 2020 through the CancerNext-Expanded+RNAinsight cancer panel found no pathogenic mutations. The CancerNext-Expanded gene panel offered by [facility] and includes sequencing and rearrangement analysis for the following 77 genes: AIP, ALK, APC*, ATM*, AXIN2, BAP1, BARD1, BLM, BMPR1A, BRCA1*, BRCA2*, BRIP1*, CDC73, CDH1*, CDK4, CDKN1B, CDKN2A, CHEK2*, CTNNA1, DICER1, FANCC, FH, FLCN, GALNT12, KIF1B, LZTR1, MAX, MEN1, MET, MLH1*, MSH2*, MSH3, MSH6*, MUTYH*, NBN, NF1*, NF2, NTHL1, PALB2*, PHOX2B, PMS2*, POT1, PRKAR1A, PTCH1, PTEN*, RAD51C*, RAD51D*, RB1, RECQL, RET, SDHA, SDHAF2, SDHB, SDHC, SDHD, SMAD4, SMARCA4, SMARCB1, SMARCE1, STK11, SUFU, TMEM127, TP53*, TSC1, TSC2, VHL and XRCC2 (sequencing and deletion/duplication); EGFR, EGLN1, HOXB13, KIT, MITF, PDGFRA, POLD1, and POLE (sequencing only); EPCAM and GREM1 (deletion/duplication only). DNA and RNA analyses performed for * genes. The test report has been scanned into EPIC and is located under the Molecular Pathology section of the Results Review tab.  A portion of the result report is included below for reference.     We discussed with Kaitlin [name] that because current genetic testing is not perfect, it is possible there may be a gene mutation in one of these genes that current testing cannot detect, but that chance is small.  We also discussed, that there could be another gene that has not yet been discovered, or that we have not yet tested, that is responsible for the cancer diagnoses in the family. It is also possible there is a hereditary cause for the cancer in the family that Kaitlin [name] did not inherit and therefore was not identified in her testing.  Therefore, it is important to remain in touch with cancer  genetics in the future so that we can continue to offer Kaitlin [name] the most up to date genetic testing.   ADDITIONAL GENETIC TESTING: We discussed with Kaitlin [name] that her genetic testing was fairly extensive.  If there are genes identified to increase cancer risk that can be analyzed in the future, we would be happy to discuss and coordinate this testing at that time.    CANCER SCREENING RECOMMENDATIONS: Kaitlin [name] test result is considered negative (normal).  This means that we have not identified a hereditary cause for her personal and family history of cancer at this time. Most cancers happen by chance and this negative test suggests that her cancer may fall into this category.    While reassuring, this does not definitively rule out a hereditary predisposition to cancer. It is still possible that there could be genetic mutations that are undetectable by current technology. There could be genetic mutations in genes that have not been tested or identified to increase cancer risk.  Therefore, it is recommended she continue to follow the cancer management and screening guidelines provided by her oncology and primary healthcare provider.   An individual's cancer risk and medical management are not determined by  genetic test results alone. Overall cancer risk assessment incorporates additional factors, including personal medical history, family history, and any available genetic information that may result in a personalized plan for cancer prevention and surveillance  RECOMMENDATIONS FOR FAMILY MEMBERS:  Individuals in this family might be at some increased risk of developing cancer, over the general population risk, simply due to the family history of cancer.  We recommended women in this family have a yearly mammogram beginning at age 3, or 52 years younger than the earliest onset of cancer, an annual clinical breast exam, and perform monthly breast self-exams. Women in this family should also have a  gynecological exam as recommended by their primary provider. All family members should be referred for colonoscopy starting at age 96.  FOLLOW-UP: Lastly, we discussed with Kaitlin [name] that cancer genetics is a rapidly advancing field and it is possible that new genetic tests will be appropriate for her and/or her family members in the future. We encouraged her to remain in contact with cancer genetics on an annual basis so we can update her personal and family histories and let her know of advances in cancer genetics that may benefit this family.   Our contact number was provided. Kaitlin [name] questions were answered to her satisfaction, and she knows she is welcome to call us at anytime with additional questions or concerns.   Roma Kayser, [place], [facility] Licensed, Certified Genetic Counselor [email or web address]

## 2020-11-17 NOTE — Telephone Encounter (Signed)
Revealed negative genetic testing.  Discussed that we do not know why she has breast cancer or why there is cancer in the family. It could be due to a different gene that we are not testing, or maybe our current technology may not be able to pick something up.  It will be important for her to keep in contact with genetics to keep up with whether additional testing may be needed. 

## 2020-11-23 NOTE — H&P (Signed)
Subjective:     Patient ID: Kaitlin [name] is a 45 y.o. female.   HPI   Returns for follow up discussion breast reconstruction. Presented with palpable right breast mass, first noted by her GYN. Diagnostic MMG/US showed right breast 3.3 cm mass in the 9:30 o'clock position, 5 mm probable satellite malignancy 9 mm inferomedial to the larger mass in the 9:30 o'clock position of the right breast, and multiple groups of calcifications in the upper outer, upper inner and lower inner quadrants of the right breast. A 2.5 cm group of calcifications in left UOQ noted. Biopsies labeled right breast upper outer 930 and right breast lower innter showed IDC with DCIS, ER/PR+, Her2-. Left breast biopsy upper outer showed intermediate grade DCIS with calcs, necrosis.   Right mastectomy recommended, patient has elected for bilateral mastectomies.   Genetics negative. 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 Plan Oncotype on surgical specimen. Started tamoxifen.   Current 32E or 34DDD. Desires smaller. Wt stable.   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 Lives with spouse and two sons ages 33, 82. Family in area to assist with post op care.   Review of Systems Remainder 12 point review negative    Objective:   Physical Exam Cardiovascular:     Rate and Rhythm: Normal rate and regular rhythm.     Heart sounds: Normal heart sounds.  Pulmonary:     Effort: Pulmonary effort is normal.     Breath sounds: Normal breath sounds.  Abdominal:     Comments: Minimal soft tissue for reconstruction  Lymphadenopathy:     Upper Body:     Right upper body: No axillary adenopathy.     Left upper body: No axillary adenopathy.  Skin:    Comments: Fitzpatrick 2       Breasts:  SN to nipple R 23 cm L 23 cm BW R 19 L 19 cm CW 15 cm Nipple to R 11 L 11 cm      Assessment:     Left breast DCIS,  Right breast IDC ER+ multifocal    Plan:       Hold tamoxifen 10 d prior to surgery.   Plan bilateral mastectomies with immediate tissue expander acellular dermis reconstruction.   Given span of disease Dr. Donne Hazel does not recommend NSM. Discussed anchor type scars. Patient herself asking for this scar even in absence of reconstruction; notes her friend had this type of scar. Reviewed reconstrucion will be asensate. Reviewed risk mastectomy flap necrosis requiring additional surgery.    Discussed use of acellular dermis in reconstruction, cadaveric source, incorporation over several weeks, risk that if has seroma or infection can act as additional nidus for infection if not incorporated.    Discussed prepectoral vs sub pectoral reconstruction. Discussed with patient and benefit of this is no animation deformity, may be less pain. Risk may be more visible rippling over upper poles, greater need of ADM. Reviewed pre pectoral would require larger amount acellular dermis, more drains. Discussed any type reconstruction also risks long term displacement implant and visible rippling. If prepectoral counseled I would recommend she be comfortable with silicone implants as more options that have less rippling. She agrees to prepectoral placement.   Reviewed reconstruction will be asensate and not stimulate. Reviewed additional risks including but not limited to risks mastectomy flap necrosis requiring additional surgery, seroma, hematoma, asymmetry, need to additional procedures, fat necrosis, DVT/PE, damage to  adjacent structures, cardiopulmonary complications.  Drain teaching completed. Rx for tramadol (cannot tolerate oxycodone in past), robaxin, and Bactrim given.

## 2020-11-28 ENCOUNTER — Other Ambulatory Visit: Payer: Self-pay

## 2020-11-28 ENCOUNTER — Encounter (HOSPITAL_BASED_OUTPATIENT_CLINIC_OR_DEPARTMENT_OTHER): Payer: Self-pay | Admitting: General Surgery

## 2020-12-01 MED ORDER — ENSURE PRE-SURGERY PO LIQD: 296.0000 mL | Freq: Once | ORAL | Status: DC

## 2020-12-01 NOTE — Progress Notes (Signed)

## 2020-12-05 NOTE — Anesthesia Preprocedure Evaluation (Addendum)
Anesthesia Evaluation  Patient identified by MRN, date of birth, ID band Patient awake    Reviewed: Allergy & Precautions, NPO status , Patient's Chart, lab work & pertinent test results  History of Anesthesia Complications (+) PONV  Airway Mallampati: I  TM Distance: >3 FB Neck ROM: Full    Dental no notable dental hx. (+) Teeth Intact, Dental Advisory Given   Pulmonary former smoker,    Pulmonary exam normal breath sounds clear to auscultation       Cardiovascular Exercise Tolerance: Good Normal cardiovascular exam Rhythm:Regular Rate:Normal     Neuro/Psych    GI/Hepatic negative GI ROS, Neg liver ROS,   Endo/Other  negative endocrine ROS  Renal/GU      Musculoskeletal   Abdominal   Peds  Hematology negative hematology ROS (+)   Anesthesia Other Findings All Ceclor  Reproductive/Obstetrics negative OB ROS                            Anesthesia Physical Anesthesia Plan  ASA: 3  Anesthesia Plan: Regional and General   Post-op Pain Management:    Induction: Intravenous  PONV Risk Score and Plan: 4 or greater and Treatment may vary due to age or medical condition, Ondansetron, Midazolam, Dexamethasone and Scopolamine patch - Pre-op  Airway Management Planned: Oral ETT  Additional Equipment: None  Intra-op Plan:   Post-operative Plan: Extubation in OR  Informed Consent: I have reviewed the patients History and Physical, chart, labs and discussed the procedure including the risks, benefits and alternatives for the proposed anesthesia with the patient or authorized representative who has indicated his/her understanding and acceptance.     Dental advisory given  Plan Discussed with:   Anesthesia Plan Comments: (GA w Bilateral Pec Blocks w Exparel)       Anesthesia Quick Evaluation

## 2020-12-06 ENCOUNTER — Ambulatory Visit (HOSPITAL_BASED_OUTPATIENT_CLINIC_OR_DEPARTMENT_OTHER): Payer: 59 | Admitting: Anesthesiology

## 2020-12-06 ENCOUNTER — Encounter (HOSPITAL_BASED_OUTPATIENT_CLINIC_OR_DEPARTMENT_OTHER)
Admission: RE | Disposition: A | Discharge status: Home / Self Care | Payer: Self-pay | Source: Home / Self Care | Attending: General Surgery

## 2020-12-06 ENCOUNTER — Observation Stay (HOSPITAL_BASED_OUTPATIENT_CLINIC_OR_DEPARTMENT_OTHER)
Admission: RE | Admit: 2020-12-06 | Discharge: 2020-12-07 | Disposition: A | Discharge status: Home / Self Care | Payer: 59 | Attending: General Surgery | Admitting: General Surgery

## 2020-12-06 ENCOUNTER — Encounter (HOSPITAL_BASED_OUTPATIENT_CLINIC_OR_DEPARTMENT_OTHER): Payer: Self-pay | Admitting: General Surgery

## 2020-12-06 ENCOUNTER — Other Ambulatory Visit: Payer: Self-pay

## 2020-12-06 DIAGNOSIS — Z803 Family history of malignant neoplasm of breast: Secondary | ICD-10-CM | POA: Insufficient documentation

## 2020-12-06 DIAGNOSIS — Z9013 Acquired absence of bilateral breasts and nipples: Secondary | ICD-10-CM

## 2020-12-06 DIAGNOSIS — D0511 Intraductal carcinoma in situ of right breast: Principal | ICD-10-CM | POA: Insufficient documentation

## 2020-12-06 DIAGNOSIS — D0512 Intraductal carcinoma in situ of left breast: Secondary | ICD-10-CM | POA: Insufficient documentation

## 2020-12-06 HISTORY — PX: TOTAL MASTECTOMY: SHX6129

## 2020-12-06 HISTORY — DX: Other specified postprocedural states: Z98.890

## 2020-12-06 HISTORY — PX: BREAST RECONSTRUCTION WITH PLACEMENT OF TISSUE EXPANDER AND ALLODERM: SHX6805

## 2020-12-06 HISTORY — DX: Nausea with vomiting, unspecified: R11.2

## 2020-12-06 HISTORY — PX: MASTECTOMY W/ SENTINEL NODE BIOPSY: SHX2001

## 2020-12-06 LAB — POCT PREGNANCY, URINE: Preg Test, Ur: NEGATIVE

## 2020-12-06 SURGERY — MASTECTOMY WITH SENTINEL LYMPH NODE BIOPSY
Anesthesia: Regional | Site: Breast | Laterality: Right

## 2020-12-06 MED ORDER — SIMETHICONE 80 MG PO CHEW: 40.0000 mg | CHEWABLE_TABLET | Freq: Four times a day (QID) | ORAL | Status: DC | PRN

## 2020-12-06 MED ORDER — FENTANYL CITRATE (PF) 100 MCG/2ML IJ SOLN
INTRAMUSCULAR | Status: AC
  Filled 2020-12-06: qty 2

## 2020-12-06 MED ORDER — SODIUM CHLORIDE 0.9 % IV SOLN: INTRAVENOUS | Status: DC

## 2020-12-06 MED ORDER — MIDAZOLAM HCL 2 MG/2ML IJ SOLN
2.0000 mg | Freq: Once | INTRAMUSCULAR | Status: AC
  Administered 2020-12-06: 2 mg via INTRAVENOUS

## 2020-12-06 MED ORDER — DEXAMETHASONE SODIUM PHOSPHATE 10 MG/ML IJ SOLN
INTRAMUSCULAR | Status: AC
  Filled 2020-12-06: qty 1

## 2020-12-06 MED ORDER — FENTANYL CITRATE (PF) 100 MCG/2ML IJ SOLN
100.0000 ug | Freq: Once | INTRAMUSCULAR | Status: AC
  Administered 2020-12-06: 100 ug via INTRAVENOUS

## 2020-12-06 MED ORDER — DEXMEDETOMIDINE (PRECEDEX) IN NS 20 MCG/5ML (4 MCG/ML) IV SYRINGE
PREFILLED_SYRINGE | INTRAVENOUS | Status: DC | PRN
  Administered 2020-12-06 (×2): 4 ug via INTRAVENOUS
  Administered 2020-12-06: 8 ug via INTRAVENOUS

## 2020-12-06 MED ORDER — ONDANSETRON 4 MG PO TBDP: 4.0000 mg | ORAL_TABLET | Freq: Four times a day (QID) | ORAL | Status: DC | PRN

## 2020-12-06 MED ORDER — ONDANSETRON HCL 4 MG/2ML IJ SOLN
INTRAMUSCULAR | Status: AC
  Filled 2020-12-06: qty 2

## 2020-12-06 MED ORDER — SUGAMMADEX SODIUM 500 MG/5ML IV SOLN
INTRAVENOUS | Status: AC
  Filled 2020-12-06: qty 5

## 2020-12-06 MED ORDER — EPHEDRINE SULFATE 50 MG/ML IJ SOLN
INTRAMUSCULAR | Status: DC | PRN
  Administered 2020-12-06: 5 mg via INTRAVENOUS

## 2020-12-06 MED ORDER — DEXAMETHASONE SODIUM PHOSPHATE 4 MG/ML IJ SOLN
INTRAMUSCULAR | Status: DC | PRN
  Administered 2020-12-06: 4 mg via INTRAVENOUS

## 2020-12-06 MED ORDER — MAGTRACE LYMPHATIC TRACER
INTRAMUSCULAR | Status: DC | PRN
  Administered 2020-12-06 (×2): 2 mL via INTRAMUSCULAR

## 2020-12-06 MED ORDER — ACETAMINOPHEN 500 MG PO TABS
ORAL_TABLET | ORAL | Status: AC
  Filled 2020-12-06: qty 2

## 2020-12-06 MED ORDER — ACETAMINOPHEN 10 MG/ML IV SOLN
INTRAVENOUS | Status: AC
  Filled 2020-12-06: qty 100

## 2020-12-06 MED ORDER — PROPOFOL 500 MG/50ML IV EMUL
INTRAVENOUS | Status: DC | PRN
  Administered 2020-12-06: 25 ug/kg/min via INTRAVENOUS

## 2020-12-06 MED ORDER — PHENYLEPHRINE HCL (PRESSORS) 10 MG/ML IV SOLN
INTRAVENOUS | Status: DC | PRN
  Administered 2020-12-06 (×3): 80 ug via INTRAVENOUS
  Administered 2020-12-06: 40 ug via INTRAVENOUS

## 2020-12-06 MED ORDER — SCOPOLAMINE 1 MG/3DAYS TD PT72
MEDICATED_PATCH | TRANSDERMAL | Status: AC
  Filled 2020-12-06: qty 1

## 2020-12-06 MED ORDER — LIDOCAINE HCL (CARDIAC) PF 100 MG/5ML IV SOSY
PREFILLED_SYRINGE | INTRAVENOUS | Status: DC | PRN
  Administered 2020-12-06: 100 mg via INTRAVENOUS

## 2020-12-06 MED ORDER — EPHEDRINE 5 MG/ML INJ
INTRAVENOUS | Status: AC
  Filled 2020-12-06: qty 5

## 2020-12-06 MED ORDER — MIDAZOLAM HCL 2 MG/2ML IJ SOLN: 2.0000 mg | Freq: Once | INTRAMUSCULAR | Status: DC

## 2020-12-06 MED ORDER — AMISULPRIDE (ANTIEMETIC) 5 MG/2ML IV SOLN
10.0000 mg | Freq: Once | INTRAVENOUS | Status: AC | PRN
  Administered 2020-12-06: 10 mg via INTRAVENOUS

## 2020-12-06 MED ORDER — HYDROMORPHONE HCL 1 MG/ML IJ SOLN
0.2500 mg | INTRAMUSCULAR | Status: DC | PRN
  Administered 2020-12-06 (×2): 0.5 mg via INTRAVENOUS

## 2020-12-06 MED ORDER — BUPIVACAINE HCL (PF) 0.25 % IJ SOLN
INTRAMUSCULAR | Status: DC | PRN
  Administered 2020-12-06 (×2): 20 mL

## 2020-12-06 MED ORDER — VANCOMYCIN HCL IN DEXTROSE 1-5 GM/200ML-% IV SOLN
1000.0000 mg | INTRAVENOUS | Status: AC
  Administered 2020-12-06: 1000 mg via INTRAVENOUS

## 2020-12-06 MED ORDER — BUPIVACAINE LIPOSOME 1.3 % IJ SUSP
INTRAMUSCULAR | Status: DC | PRN
  Administered 2020-12-06 (×2): 10 mL

## 2020-12-06 MED ORDER — CELECOXIB 200 MG PO CAPS
ORAL_CAPSULE | ORAL | Status: AC
  Filled 2020-12-06: qty 2

## 2020-12-06 MED ORDER — LACTATED RINGERS IV SOLN
INTRAVENOUS | Status: DC
  Administered 2020-12-06: 10 mL via INTRAVENOUS

## 2020-12-06 MED ORDER — ACETAMINOPHEN 10 MG/ML IV SOLN
1000.0000 mg | Freq: Once | INTRAVENOUS | Status: DC | PRN
  Administered 2020-12-06: 1000 mg via INTRAVENOUS

## 2020-12-06 MED ORDER — MIDAZOLAM HCL 2 MG/2ML IJ SOLN
INTRAMUSCULAR | Status: AC
  Filled 2020-12-06: qty 2

## 2020-12-06 MED ORDER — VANCOMYCIN HCL IN DEXTROSE 1-5 GM/200ML-% IV SOLN
INTRAVENOUS | Status: AC
  Filled 2020-12-06: qty 200

## 2020-12-06 MED ORDER — PROPOFOL 10 MG/ML IV BOLUS
INTRAVENOUS | Status: AC
  Filled 2020-12-06: qty 20

## 2020-12-06 MED ORDER — PROPOFOL 10 MG/ML IV BOLUS
INTRAVENOUS | Status: DC | PRN
  Administered 2020-12-06: 120 mg via INTRAVENOUS

## 2020-12-06 MED ORDER — FENTANYL CITRATE (PF) 100 MCG/2ML IJ SOLN: 100.0000 ug | Freq: Once | INTRAMUSCULAR | Status: DC

## 2020-12-06 MED ORDER — ONDANSETRON HCL 4 MG/2ML IJ SOLN: 4.0000 mg | Freq: Once | INTRAMUSCULAR | Status: DC | PRN

## 2020-12-06 MED ORDER — ROCURONIUM BROMIDE 100 MG/10ML IV SOLN
INTRAVENOUS | Status: DC | PRN
  Administered 2020-12-06: 50 mg via INTRAVENOUS
  Administered 2020-12-06: 20 mg via INTRAVENOUS

## 2020-12-06 MED ORDER — OXYCODONE HCL 5 MG PO TABS: 5.0000 mg | ORAL_TABLET | Freq: Once | ORAL | Status: DC | PRN

## 2020-12-06 MED ORDER — CELECOXIB 200 MG PO CAPS
400.0000 mg | ORAL_CAPSULE | ORAL | Status: AC
  Administered 2020-12-06: 400 mg via ORAL

## 2020-12-06 MED ORDER — MORPHINE SULFATE (PF) 4 MG/ML IV SOLN: 1.0000 mg | INTRAVENOUS | Status: DC | PRN

## 2020-12-06 MED ORDER — ACETAMINOPHEN 500 MG PO TABS
1000.0000 mg | ORAL_TABLET | ORAL | Status: AC
  Administered 2020-12-06: 1000 mg via ORAL

## 2020-12-06 MED ORDER — SUGAMMADEX SODIUM 500 MG/5ML IV SOLN
INTRAVENOUS | Status: DC | PRN
  Administered 2020-12-06: 150 mg via INTRAVENOUS

## 2020-12-06 MED ORDER — SCOPOLAMINE 1 MG/3DAYS TD PT72
1.0000 | MEDICATED_PATCH | TRANSDERMAL | Status: DC
  Administered 2020-12-06: 1.5 mg via TRANSDERMAL

## 2020-12-06 MED ORDER — PHENYLEPHRINE 40 MCG/ML (10ML) SYRINGE FOR IV PUSH (FOR BLOOD PRESSURE SUPPORT)
PREFILLED_SYRINGE | INTRAVENOUS | Status: AC
  Filled 2020-12-06: qty 10

## 2020-12-06 MED ORDER — CHLORHEXIDINE GLUCONATE CLOTH 2 % EX PADS: 6.0000 | MEDICATED_PAD | Freq: Once | CUTANEOUS | Status: DC

## 2020-12-06 MED ORDER — ROCURONIUM BROMIDE 10 MG/ML (PF) SYRINGE
PREFILLED_SYRINGE | INTRAVENOUS | Status: AC
  Filled 2020-12-06: qty 10

## 2020-12-06 MED ORDER — FENTANYL CITRATE (PF) 100 MCG/2ML IJ SOLN
INTRAMUSCULAR | Status: DC | PRN
  Administered 2020-12-06: 100 ug via INTRAVENOUS
  Administered 2020-12-06 (×2): 50 ug via INTRAVENOUS

## 2020-12-06 MED ORDER — ONDANSETRON HCL 4 MG/2ML IJ SOLN: 4.0000 mg | Freq: Four times a day (QID) | INTRAMUSCULAR | Status: DC | PRN

## 2020-12-06 MED ORDER — AMISULPRIDE (ANTIEMETIC) 5 MG/2ML IV SOLN
INTRAVENOUS | Status: AC
  Filled 2020-12-06: qty 2

## 2020-12-06 MED ORDER — OXYCODONE HCL 5 MG/5ML PO SOLN: 5.0000 mg | Freq: Once | ORAL | Status: DC | PRN

## 2020-12-06 MED ORDER — HYDROMORPHONE HCL 1 MG/ML IJ SOLN
INTRAMUSCULAR | Status: AC
  Filled 2020-12-06: qty 0.5

## 2020-12-06 MED ORDER — METHOCARBAMOL 500 MG PO TABS
500.0000 mg | ORAL_TABLET | Freq: Four times a day (QID) | ORAL | Status: DC | PRN
  Administered 2020-12-06 – 2020-12-07 (×2): 500 mg via ORAL
  Filled 2020-12-06 (×2): qty 1

## 2020-12-06 MED ORDER — LIDOCAINE 2% (20 MG/ML) 5 ML SYRINGE
INTRAMUSCULAR | Status: AC
  Filled 2020-12-06: qty 5

## 2020-12-06 MED ORDER — ACETAMINOPHEN 500 MG PO TABS
1000.0000 mg | ORAL_TABLET | Freq: Four times a day (QID) | ORAL | Status: DC
  Administered 2020-12-06 – 2020-12-07 (×2): 1000 mg via ORAL
  Filled 2020-12-06: qty 2

## 2020-12-06 MED ORDER — ONDANSETRON HCL 4 MG/2ML IJ SOLN
INTRAMUSCULAR | Status: DC | PRN
  Administered 2020-12-06: 4 mg via INTRAVENOUS

## 2020-12-06 MED ORDER — TRAMADOL HCL 50 MG PO TABS
100.0000 mg | ORAL_TABLET | Freq: Four times a day (QID) | ORAL | Status: DC | PRN
  Administered 2020-12-06: 100 mg via ORAL
  Filled 2020-12-06: qty 2

## 2020-12-06 SURGICAL SUPPLY — 105 items
ALLODERM 16X20 PERFORATED (Tissue) ×2 IMPLANT
ALLOGRAFT PERF 16X20 1.6+/-0.4 (Tissue) ×2 IMPLANT
APPLIER CLIP 11 MED OPEN (CLIP)
APPLIER CLIP 9.375 MED OPEN (MISCELLANEOUS) ×5
BAG DECANTER FOR FLEXI CONT (MISCELLANEOUS) ×3 IMPLANT
BENZOIN TINCTURE PRP APPL 2/3 (GAUZE/BANDAGES/DRESSINGS) IMPLANT
BINDER BREAST LRG (GAUZE/BANDAGES/DRESSINGS) ×2 IMPLANT
BINDER BREAST MEDIUM (GAUZE/BANDAGES/DRESSINGS) ×4 IMPLANT
BINDER BREAST XLRG (GAUZE/BANDAGES/DRESSINGS) IMPLANT
BINDER BREAST XXLRG (GAUZE/BANDAGES/DRESSINGS) IMPLANT
BIOPATCH RED 1 DISK 7.0 (GAUZE/BANDAGES/DRESSINGS) ×4 IMPLANT
BIOPATCH RED 1IN DISK 7.0MM (GAUZE/BANDAGES/DRESSINGS) ×4
BLADE CLIPPER SURG (BLADE) IMPLANT
BLADE HEX COATED 2.75 (ELECTRODE) ×2 IMPLANT
BLADE SURG 10 STRL SS (BLADE) ×12 IMPLANT
BLADE SURG 15 STRL LF DISP TIS (BLADE) ×3 IMPLANT
BLADE SURG 15 STRL SS (BLADE) ×2
BNDG GAUZE ELAST 4 BULKY (GAUZE/BANDAGES/DRESSINGS) IMPLANT
CANISTER SUCT 1200ML W/VALVE (MISCELLANEOUS) ×5 IMPLANT
CHLORAPREP W/TINT 26 (MISCELLANEOUS) ×10 IMPLANT
CLIP APPLIE 11 MED OPEN (CLIP) IMPLANT
CLIP APPLIE 9.375 MED OPEN (MISCELLANEOUS) IMPLANT
CLIP TI WIDE RED SMALL 6 (CLIP) IMPLANT
CLOSURE WOUND 1/2 X4 (GAUZE/BANDAGES/DRESSINGS)
COUNTER NEEDLE 1200 MAGNETIC (NEEDLE) IMPLANT
COVER BACK TABLE 60X90IN (DRAPES) ×5 IMPLANT
COVER MAYO STAND STRL (DRAPES) ×7 IMPLANT
COVER PROBE W GEL 5X96 (DRAPES) ×5 IMPLANT
DECANTER SPIKE VIAL GLASS SM (MISCELLANEOUS) IMPLANT
DERMABOND ADVANCED (GAUZE/BANDAGES/DRESSINGS) ×4
DERMABOND ADVANCED .7 DNX12 (GAUZE/BANDAGES/DRESSINGS) ×6 IMPLANT
DRAIN CHANNEL 15F RND FF W/TCR (WOUND CARE) ×4 IMPLANT
DRAIN CHANNEL 19F RND (DRAIN) ×10 IMPLANT
DRAPE INCISE IOBAN 66X45 STRL (DRAPES) ×2 IMPLANT
DRAPE TOP ARMCOVERS (MISCELLANEOUS) ×5 IMPLANT
DRAPE U-SHAPE 76X120 STRL (DRAPES) ×7 IMPLANT
DRAPE UTILITY XL STRL (DRAPES) ×7 IMPLANT
DRSG PAD ABDOMINAL 8X10 ST (GAUZE/BANDAGES/DRESSINGS) ×12 IMPLANT
DRSG TEGADERM 2-3/8X2-3/4 SM (GAUZE/BANDAGES/DRESSINGS) IMPLANT
DRSG TEGADERM 4X10 (GAUZE/BANDAGES/DRESSINGS) ×8 IMPLANT
DRSG TEGADERM 4X4.75 (GAUZE/BANDAGES/DRESSINGS) IMPLANT
ELECT BLADE 4.0 EZ CLEAN MEGAD (MISCELLANEOUS) ×5
ELECT COATED BLADE 2.86 ST (ELECTRODE) ×3 IMPLANT
ELECT REM PT RETURN 9FT ADLT (ELECTROSURGICAL) ×5
ELECTRODE BLDE 4.0 EZ CLN MEGD (MISCELLANEOUS) ×3 IMPLANT
ELECTRODE REM PT RTRN 9FT ADLT (ELECTROSURGICAL) ×3 IMPLANT
EVACUATOR SILICONE 100CC (DRAIN) ×11 IMPLANT
EXPANDER TISSUE FV FOURTE 400 (Prosthesis & Implant Plastic) IMPLANT
GAUZE SPONGE 4X4 12PLY STRL LF (GAUZE/BANDAGES/DRESSINGS) IMPLANT
GLOVE SURG ENC MOIS LTX SZ6.5 (GLOVE) ×2 IMPLANT
GLOVE SURG ENC MOIS LTX SZ7 (GLOVE) ×5 IMPLANT
GLOVE SURG HYDRASOFT LTX SZ5.5 (GLOVE) ×10 IMPLANT
GLOVE SURG UNDER POLY LF SZ6.5 (GLOVE) IMPLANT
GLOVE SURG UNDER POLY LF SZ7.5 (GLOVE) ×11 IMPLANT
GOWN STRL REUS W/ TWL LRG LVL3 (GOWN DISPOSABLE) ×9 IMPLANT
GOWN STRL REUS W/TWL LRG LVL3 (GOWN DISPOSABLE) ×6
HEMOSTAT ARISTA ABSORB 3G PWDR (HEMOSTASIS) IMPLANT
KIT FILL ASEPTIC TRANSFER (MISCELLANEOUS) ×2 IMPLANT
KIT FILL SYSTEM UNIVERSAL (SET/KITS/TRAYS/PACK) IMPLANT
MARKER SKIN DUAL TIP RULER LAB (MISCELLANEOUS) IMPLANT
NDL HYPO 25X1 1.5 SAFETY (NEEDLE) IMPLANT
NDL SAFETY ECLIPSE 18X1.5 (NEEDLE) ×3 IMPLANT
NEEDLE HYPO 18GX1.5 SHARP (NEEDLE)
NEEDLE HYPO 25X1 1.5 SAFETY (NEEDLE) ×10 IMPLANT
NS IRRIG 1000ML POUR BTL (IV SOLUTION) ×5 IMPLANT
PACK BASIN DAY SURGERY FS (CUSTOM PROCEDURE TRAY) ×5 IMPLANT
PENCIL SMOKE EVACUATOR (MISCELLANEOUS) ×5 IMPLANT
PIN SAFETY STERILE (MISCELLANEOUS) ×7 IMPLANT
PUNCH BIOPSY DERMAL 4MM (MISCELLANEOUS) IMPLANT
RETRACTOR ONETRAX LX 90X20 (MISCELLANEOUS) IMPLANT
SHEET MEDIUM DRAPE 40X70 STRL (DRAPES) ×5 IMPLANT
SLEEVE SCD COMPRESS KNEE MED (STOCKING) ×5 IMPLANT
SPONGE T-LAP 18X18 ~~LOC~~+RFID (SPONGE) ×14 IMPLANT
SPONGE T-LAP 4X18 ~~LOC~~+RFID (SPONGE) IMPLANT
STAPLER VISISTAT 35W (STAPLE) ×5 IMPLANT
STRIP CLOSURE SKIN 1/2X4 (GAUZE/BANDAGES/DRESSINGS) IMPLANT
SUT CHROMIC 4 0 PS 2 18 (SUTURE) ×10 IMPLANT
SUT ETHILON 2 0 FS 18 (SUTURE) ×7 IMPLANT
SUT MNCRL AB 4-0 PS2 18 (SUTURE) ×7 IMPLANT
SUT PDS AB 2-0 CT2 27 (SUTURE) IMPLANT
SUT SILK 2 0 SH (SUTURE) ×2 IMPLANT
SUT VIC AB 2-0 SH 27 (SUTURE) ×2
SUT VIC AB 2-0 SH 27XBRD (SUTURE) ×6 IMPLANT
SUT VIC AB 3-0 54X BRD REEL (SUTURE) IMPLANT
SUT VIC AB 3-0 BRD 54 (SUTURE)
SUT VIC AB 3-0 SH 27 (SUTURE) ×8
SUT VIC AB 3-0 SH 27X BRD (SUTURE) IMPLANT
SUT VICRYL 0 CT-2 (SUTURE) ×8 IMPLANT
SUT VICRYL 3-0 CR8 SH (SUTURE) ×5 IMPLANT
SUT VICRYL 4-0 PS2 18IN ABS (SUTURE) ×4 IMPLANT
SUT VLOC 180 0 24IN GS25 (SUTURE) ×4 IMPLANT
SYR 10ML LL (SYRINGE) ×5 IMPLANT
SYR 50ML LL SCALE MARK (SYRINGE) ×5 IMPLANT
SYR BULB IRRIG 60ML STRL (SYRINGE) ×5 IMPLANT
SYR CONTROL 10ML LL (SYRINGE) IMPLANT
TAPE MEASURE VINYL STERILE (MISCELLANEOUS) IMPLANT
TISSUE EXPNDR FV FOURTE 400 (Prosthesis & Implant Plastic) ×10 IMPLANT
TOWEL GREEN STERILE FF (TOWEL DISPOSABLE) ×10 IMPLANT
TRACER MAGTRACE VIAL (MISCELLANEOUS) ×4 IMPLANT
TRAY DSU PREP LF (CUSTOM PROCEDURE TRAY) IMPLANT
TRAY FOLEY W/BAG SLVR 14FR LF (SET/KITS/TRAYS/PACK) IMPLANT
TUBE CONNECTING 20'X1/4 (TUBING) ×1
TUBE CONNECTING 20X1/4 (TUBING) ×4 IMPLANT
UNDERPAD 30X36 HEAVY ABSORB (UNDERPADS AND DIAPERS) ×10 IMPLANT
YANKAUER SUCT BULB TIP NO VENT (SUCTIONS) ×5 IMPLANT

## 2020-12-06 NOTE — Interval H&P Note (Signed)
History and Physical Interval Note:  12/06/2020 10:24 AM  Kaitlin [name]  has presented today for surgery, with the diagnosis of BREAST CANCER.  The various methods of treatment have been discussed with the patient and family. After consideration of risks, benefits and other options for treatment, the patient has consented to  Procedure(s): RIGHT MASTECTOMY WITH RIGHT AXILLARY SENTINEL LYMPH NODE BIOPSY (Right) LEFT TOTAL MASTECTOMY (Left) BREAST RECONSTRUCTION WITH PLACEMENT OF TISSUE EXPANDER AND ALLODERM (Bilateral) as a surgical intervention.  The patient's history has been reviewed, patient examined, no change in status, stable for surgery.  I have reviewed the patient's chart and labs.  Questions were answered to the patient's satisfaction.     Arnoldo Hooker Shaana Acocella

## 2020-12-06 NOTE — Transfer of Care (Signed)
Immediate Anesthesia Transfer of Care Note  Patient: Kaitlin [name]  Procedure(s) Performed: RIGHT MASTECTOMY WITH RIGHT AXILLARY SENTINEL LYMPH NODE BIOPSY (Right: Breast) LEFT TOTAL MASTECTOMY (Left: Breast) BREAST RECONSTRUCTION WITH PLACEMENT OF TISSUE EXPANDER AND ALLODERM (Bilateral: Breast)  Patient Location: PACU  Anesthesia Type:General  Level of Consciousness: drowsy, patient cooperative and responds to stimulation  Airway & Oxygen Therapy: Patient Spontanous Breathing and Patient connected to face mask oxygen  Post-op Assessment: Report given to RN and Post -op Vital signs reviewed and stable  Post vital signs: Reviewed and stable  Last Vitals:  Vitals Value Taken Time  BP 111/66 12/06/20 1517  Temp    Pulse 80 12/06/20 1518  Resp    SpO2 97 % 12/06/20 1518  Vitals shown include unvalidated device data.  Last Pain:  Vitals:   12/06/20 1011  TempSrc: Oral  PainSc: 0-No pain      Patients Stated Pain Goal: 3 (50/72/25 7505)  Complications: No notable events documented.

## 2020-12-06 NOTE — H&P (Signed)
45 year old female who is otherwise fairly healthy with no prior breast history presents with bilateral breast cancer. She has a family history of a possible ovarian cancer in her maternal aunt had breast cancer in a paternal aunt. She was noted by her gynecologist to have a possible right breast mass that was sent for evaluation. She did not notice this area. She has no discharge. She underwent evaluation with mammogram and ultrasound that shows a 3.3 cm mass in the right breast as well as a 5 mm probable satellite malignancy near that. There are multiple groups of calcifications in the right breast measuring almost up to 10 cm in size. This total area spans 9.8 x 6.3 x 5.9 cm. Her breast density is C. Her husband states that it does not look like her areola is been indenting for some time now. She was also noted to have a 2.2 cm area of indeterminate calcifications in the left breast also. She underwent biopsy of the right side. 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 She was seen by oncology yesterday. She has genetics scheduled for later today. She comes in today desiring double mastectomies.  Review of Systems: A complete review of systems was obtained from the patient. I have reviewed this information and discussed as appropriate with the patient. See HPI as well for other ROS.  Review of Systems  All other systems reviewed and are negative.   Medical History: History reviewed. No pertinent past medical history.  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)   Bilateral breast cancer (CMS-HCC)   Cellulitis of finger of left hand   Fracture of great toe   LLQ abdominal pain   Pain in right foot   Past Surgical History:   Procedure Laterality Date   CERVICAL BIOPSY W/ LOOP ELECTRODE EXCISION   lipoma Bilateral   TONSILLECTOMY    Allergies  Allergen Reactions   Ceclor [Cefaclor] Anaphylaxis   Rizatriptan Anaphylaxis   Amoxicillin Hives   Current Outpatient Medications on File Prior to Visit  Medication Sig Dispense Refill   acetylcysteine (N-ACETYL-L-CYSTEINE MISC) Use   ascorbic acid, vitamin C, (VITAMIN C) 125 mg Chew Take by mouth once daily   calcium/magnesium (CALCIUM AND MAGNESIUM ORAL) Take by mouth once daily   cholecalciferol (VITAMIN D3) 2,000 unit capsule Take 2,000 Units by mouth once daily   diazepam (VALIUM ORAL) Take by mouth   ELDERBERRY FRUIT-HONEY ORAL Take 1 Tbsp by mouth once daily.   Lactobacillus acidophilus (PROBIOTIC) 10 billion cell Cap Take by mouth once daily   mecobalamin (B12 ACTIVE ORAL) Take by mouth once daily   multivitamin tablet Take 1 tablet by mouth once daily   ondansetron (ZOFRAN-ODT) 4 MG disintegrating tablet Take 4 mg by mouth every 8 (eight) hours as needed 0   vitamins A,C,and E-selenium Cap Take by mouth once daily   zinc 50 mg Tab Take by mouth once daily   No current facility-administered medications on file prior to visit.   Family History  Problem Relation Age of Onset   Hypertrophic cardiomyopathy Father 72  asymptomatic carrier of familial mutation MYBPC3 [id number]   Hypertrophic cardiomyopathy Paternal Uncle 27  h/o HCM; proband in family; MYBPC3 E542Q positive   Hypertrophic cardiomyopathy Paternal 44  82  asymptomatic carrier of family mutation MYBPC3 E542Q   Sudden cardiac death Neg Hx    Social History   Tobacco Use  Smoking Status Former Smoker   Quit date: 10/07/2002   Years since quitting: 18.0  Smokeless Tobacco Never Used  Tobacco Comment  about 2 cigarette per day socially for just 2-3 yrs    Social History   Socioeconomic History   Marital status: Unknown  Tobacco Use   Smoking status: Former Smoker  Quit date:  10/07/2002  Years since quitting: 18.0   Smokeless tobacco: Never Used   Tobacco comment: about 2 cigarette per day socially for just 2-3 yrs  Substance and Sexual Activity   Alcohol use: No   Drug use: No   Objective:   Vitals:  11/02/20 1024  BP: 120/62  Pulse: 110  Temp: 37.1 C (98.7 F)  SpO2: 98%  Weight: 68.1 kg (150 lb 3.2 oz)  Height: 172.7 cm (5' 8" )   Body mass index is 22.84 kg/m.  Physical Exam Constitutional:  Appearance: Normal appearance.  Chest:  Breasts:  Right: Mass present. No nipple discharge.  Left: No mass or nipple discharge.  Comments: Nodular area ruq Lymphadenopathy:  Upper Body:  Right upper body: No supraclavicular or axillary adenopathy.  Left upper body: No supraclavicular or axillary adenopathy.  Neurological:  Mental Status: She is alert.     Assessment and Plan:  Bilateral mastectomies, right axillary sentinel node biopsy with mag trace  We discussed the staging and pathophysiology of breast cancer. We discussed all of the different options for treatment for breast cancer including surgery, chemotherapy, radiation therapy, Herceptin, and antiestrogen therapy. We discussed a sentinel lymph node biopsy as she does not appear to having lymph node involvement right now. We discussed the performance of that with injection of radioactive tracer. We discussed that there is a chance of having a positive node with a sentinel lymph node biopsy and we will await the permanent pathology to make any other first further decisions in terms of her treatment. We discussed up to a 5% risk lifetime of chronic shoulder pain as well as lymphedema associated with a sentinel lymph node biopsy. I will send for sozo measurements now.  We discussed the options for treatment of the breast cancer which included lumpectomy versus a mastectomy. We discussed the performance of the lumpectomy with radioactive seed placement. We discussed a 5-10% chance of a positive  margin requiring reexcision in the operating room. We also discussed that she will likely need radiation therapy if she undergoes lumpectomy. We discussed mastectomy and the postoperative care for that as well. Mastectomy can be followed by reconstruction. The decision for lumpectomy vs mastectomy has no impact on decision for chemotherapy. Most mastectomy patients will not need radiation therapy. We discussed that there is no difference in her survival whether she undergoes lumpectomy with radiation therapy or antiestrogen therapy versus a mastectomy. There is also no real difference between her recurrence in the breast. She would be a candidate for either on the left side. However on the right side I think she needs undergo mastectomy. I think a skin sparing mastectomy if combined with reconstruction is reasonable but I do not think a nipple sparing mastectomy is the best option in her case. She would desire mastectomies on both sides. She is unsure about reconstruction so I will have her see plastic surgery before we proceed with scheduling. We discussed the risks of operation including bleeding, infection, possible reoperation. She understands  her further therapy will be based on what her stages at the time of her operation.

## 2020-12-06 NOTE — Interval H&P Note (Signed)
History and Physical Interval Note:  12/06/2020 10:58 AM  Kaitlin [name]  has presented today for surgery, with the diagnosis of BREAST CANCER.  The various methods of treatment have been discussed with the patient and family. After consideration of risks, benefits and other options for treatment, the patient has consented to  Procedure(s): RIGHT MASTECTOMY WITH RIGHT AXILLARY SENTINEL LYMPH NODE BIOPSY (Right) LEFT TOTAL MASTECTOMY (Left) BREAST RECONSTRUCTION WITH PLACEMENT OF TISSUE EXPANDER AND ALLODERM (Bilateral) as a surgical intervention.  The patient's history has been reviewed, patient examined, no change in status, stable for surgery.  I have reviewed the patient's chart and labs.  Questions were answered to the patient's satisfaction.     Rolm Bookbinder

## 2020-12-06 NOTE — Anesthesia Procedure Notes (Signed)
Procedure Name: Intubation Date/Time: 12/06/2020 11:31 AM Performed by: Glory Buff, CRNA Pre-anesthesia Checklist: Patient identified, Emergency Drugs available, Suction available and Patient being monitored Patient Re-evaluated:Patient Re-evaluated prior to induction Oxygen Delivery Method: Circle system utilized Preoxygenation: Pre-oxygenation with 100% oxygen Induction Type: IV induction Ventilation: Mask ventilation without difficulty Laryngoscope Size: Miller and 3 Grade View: Grade I Tube type: Oral Tube size: 7.0 mm Number of attempts: 1 Airway Equipment and Method: Stylet and Oral airway Placement Confirmation: ETT inserted through vocal cords under direct vision, positive ETCO2 and breath sounds checked- equal and bilateral Secured at: 21 cm Tube secured with: Tape Dental Injury: Teeth and Oropharynx as per pre-operative assessment

## 2020-12-06 NOTE — Anesthesia Postprocedure Evaluation (Signed)
Anesthesia Post Note  Patient: Kaitlin [name]  Procedure(s) Performed: RIGHT MASTECTOMY WITH RIGHT AXILLARY SENTINEL LYMPH NODE BIOPSY (Right: Breast) LEFT TOTAL MASTECTOMY (Left: Breast) BREAST RECONSTRUCTION WITH PLACEMENT OF TISSUE EXPANDER AND ALLODERM (Bilateral: Breast)     Patient location during evaluation: PACU Anesthesia Type: Regional Level of consciousness: awake and alert Pain management: pain level controlled Vital Signs Assessment: post-procedure vital signs reviewed and stable Respiratory status: spontaneous breathing, nonlabored ventilation, respiratory function stable and patient connected to nasal cannula oxygen Cardiovascular status: blood pressure returned to baseline and stable Postop Assessment: no apparent nausea or vomiting Anesthetic complications: no   No notable events documented.  Last Vitals:  Vitals:   12/06/20 1615 12/06/20 1655  BP: 105/66 (!) 106/58  Pulse: 79 82  Resp: 11 16  Temp:  37.1 C  SpO2: 96% 96%    Last Pain:  Vitals:   12/06/20 1609  TempSrc:   PainSc: [place]

## 2020-12-06 NOTE — Anesthesia Procedure Notes (Signed)
Anesthesia Regional Block: Pectoralis block   Pre-Anesthetic Checklist: , timeout performed,  Correct Patient, Correct Site, Correct Laterality,  Correct Procedure, Correct Position, site marked,  Risks and benefits discussed,  Surgical consent,  Pre-op evaluation,  At surgeon's request and post-op pain management  Laterality: Right and Upper  Prep: chloraprep       Needles:  Injection technique: Single-shot  Needle Type: Echogenic Needle     Needle Length: 9cm  Needle Gauge: 21     Additional Needles:   Procedures:,,,, ultrasound used (permanent image in chart),,    Narrative:  Start time: 12/06/2020 10:51 AM End time: 12/06/2020 11:01 AM Injection made incrementally with aspirations every 5 mL.  Performed by: Personally  Anesthesiologist: Barnet Glasgow, MD  Additional Notes: Block assessed. Patient tolerated procedure well.

## 2020-12-06 NOTE — Op Note (Signed)
Preoperative diagnosis: clinical stage 0 left breast cancer, clinical stage II right breast cancer Postoperative diagnosis: saa Procedure: Left mastectomy Injection magtrace left breast for delayed sentinel node Right mastectomy njection of mag trace right breast for sentinel lymph node identification 5.  Right deep axillary sentinel lymph node biopsy Surgeon Dr. Serita Grammes Anesthesia: General with bilateral pectoral block Complications: None Drains: Per plastic surgery Specimens: 1.  Left breast tissue marked short superior, long lateral 2.  Right breast tissue marked short superior, long lateral 3.  Right deep axillary sentinel lymph nodes with highest count of 6850 Sponge and count was correct at the end of my portion.   Disposition case turned over to plastic surgeon for reconstruction  Indications:45 year old female who is otherwise fairly healthy with no prior breast history presents with bilateral breast cancer. She underwent evaluation with mammogram and ultrasound that shows a 3.3 cm mass in the right breast as well as a 5 mm probable satellite malignancy near that. There are multiple groups of calcifications in the right breast measuring almost up to 10 cm in size. This total area spans 9.8 x 6.3 x 5.9 cm. Her breast density is C. She was also noted to have a 2.2 cm area of indeterminate calcifications in the left breast also. She underwent biopsy of the right side. 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 She desired bilateral mastectomies.   Procedure: After informed consent was obtained the patient first underwent bilateral pectoral blocks.  She was given antibiotics.  SCDs were in place.  She was then placed under anesthesia without complication.  A surgical timeout was then performed.  I then injected the mag trace on both  sides.  I cleansed the area with alcohol.  I then injected 2 cc of mag trace in the subareolar position.  The left side was done in case we need to do a delayed sentinel lymph node in the next 30 days.  The right side was done for sentinel lymph node biopsy today.  She was then prepped and draped in a standard sterile surgical fashion.  Surgical timeout was performed again then.  I first did the left mastectomy.  She had been marked by plastic surgery.  I did a incision that encompassed the nipple and areola.  I then created flaps to the sternum, clavicle, lateral border of the breast and inframammary fold.  I then remove the breast tissue including the fascia from the pectoralis muscle.  I passed this off and marked it as above.  Hemostasis was obtained.  I then used the probe and there was signal in the axilla in case we need to return to do a delayed sentinel lymph node biopsy.  I then did the right mastectomy.  I was able to get signal in the axilla before I began.  I then made a similar incision to remove the nipple and the areola and created flaps in a similar position.  I then remove the breast and the fascia from the muscle and passed this off the table after marking it as above.  I then entered into the axilla.  I was able to notice 2 or 3 lymph nodes that were active.  I remove these.  The highest count is listed as above.  I then reinterrogate in the axilla and there is no more activity.  I then obtained hemostasis and placed sponges on the side.  I turned the case over to plastic surgery for reconstruction.

## 2020-12-06 NOTE — Anesthesia Procedure Notes (Addendum)
Anesthesia Regional Block: Pectoralis block   Pre-Anesthetic Checklist: , timeout performed,  Correct Patient, Correct Site, Correct Laterality,  Correct Procedure, Correct Position, site marked,  Risks and benefits discussed,  Surgical consent,  Pre-op evaluation,  At surgeon's request and post-op pain management  Laterality: Lower and Left  Prep: Maximum Sterile Barrier Precautions used, chloraprep       Needles:  Injection technique: Single-shot  Needle Type: Echogenic Needle     Needle Length: 9cm  Needle Gauge: 21     Additional Needles:   Procedures:,,,, ultrasound used (permanent image in chart),,    Narrative:  Start time: 12/06/2020 10:42 AM End time: 12/06/2020 10:50 AM Injection made incrementally with aspirations every 5 mL.  Performed by: Personally  Anesthesiologist: Barnet Glasgow, MD  Additional Notes: Block assessed. Patient tolerated procedure well.

## 2020-12-06 NOTE — Op Note (Signed)
Operative Note   DATE OF OPERATION: 11.15.22  LOCATION: [facility]-observation  SURGICAL DIVISION: Plastic Surgery  PREOPERATIVE DIAGNOSES:  1. Right breast cancer ER+ multifocal 2. Left breast DCIS  POSTOPERATIVE DIAGNOSES:  same  PROCEDURE:  1. Bilateral breast reconstruction with tissue expanders 2. Acellular dermis (Alloderm) to bilateral chest 600 cm2  SURGEON: Irene Limbo MD MBA  ASSISTANT: Gentry Fitz RNFA  ANESTHESIA:  General.   EBL: 250 ml for entire procedure  COMPLICATIONS: None immediate.   INDICATIONS FOR PROCEDURE:  The patient, Kaitlin [name], is a 45 y.o. female born on 03-20-1975, is here for immediate prepectoral tissue expander based reconstruction following skin reduction pattern mastectomies.   FINDINGS: Natrelle 133S-FV-12-T 400 ml tissue expanders placed bilateral, initial fill volume 150 ml air. RIGHT SN [id number] LEFT SN [id number]  DESCRIPTION OF PROCEDURE:  The patient was marked with the patient in the preoperative area to mark sternal notch, chest midline, anterior axillary lines and inframammary folds. Patient was marked for skin reduction mastectomy with most superior portion nipple areola marked on breast meridian. Vertical limbs marked at lateral border areolae and set at 9 cm length. The patient was taken to the operating room. SCDs were placed and IV antibiotics were given. Foley catheter placed. The patient's operative site was prepped and draped in a sterile fashion. A time out was performed and all information was confirmed to be correct. Following completion of mastectomies, reconstruction began on left side. In supine position, the lateral limbs for resection marked and area over lower pole preserved as inferiorly based dermal pedicle. Skin de epithelialized in this area.     The cavity was irrigated with saline solution containing betadine. Hemostasis was ensured. A 19 Fr drain was placed in subcutaneous position laterally and a 15  Fr drain placed along inframammary fold. Each secured to skin with 2-0 nylon. The tissue expanders were prepared on back table prior in insertion. The expander was filled with air. Perforated acellular dermis was  draped over anterior surface expander. The ADM was then secured to itself over posterior surface of expander with 4-0 chromic. Redundant folds acellular dermis excised so that the ADM lay flat without folds over air filled expander. The expander was secured to medial insertion pectoralis with a 0 vicryl. The superior and lateral tabs also secured to pectoralis muscle with 0-vicryl. The ADM was secured to pectoralis muscle and chest wall along inferior border at inframammary fold with 0 v lock suture. Laterally the mastectomy flap over posterior axillary line was advanced anteriorly and the subcutaneous tissue and superficial fascia was secured to chest wall with 0-vicryl. The inferiorly based dermal pedicle was redraped superiorly over expander and acellular dermis and secured to pectoralis with interrupted 0-vicryl. Skin closure completed with 3-0 vicryl in fascial layer and 4-0 vicryl in dermis. Skin closure completed with 4-0 monocryl subcuticular and tissue adhesive.   I then directed my attention to left chest where similar irrigation and drain placement completed. The prepared expander with ADM secured over anterior surface was placed in left chest and tabs secured to chest wall and pectoralis muscle with 0- vicryl suture. The acellular dermis at inframammary fold was secured to chest wall with 0 V-lock suture. Laterally the mastectomy flap over posterior axillary line was advanced anteriorly and the subcutaneous tissue and superficial fascia was secured to chest wall with 0-vicryl. The inferiorly based dermal pedicle was redraped superiorly over expander and acellular dermis and secured to pectoralis with interrupted 0-vicryl. Skin closure completed with 3-0  vicryl in fascial layer and 4-0 vicryl  in dermis. Skin closure completed with 4-0 monocryl subcuticular and tissue adhesive. Tegaderms applied bilateral, followed by dry dressing and breast   The patient was allowed to wake from anesthesia, extubated and taken to the recovery room in satisfactory condition.   SPECIMENS: none  DRAINS: 15 and 19 Fr JP in right and left breast reconstruction.

## 2020-12-06 NOTE — Progress Notes (Signed)
Assisted Dr. Valma Cava with right, left, ultrasound guided, pectoralis block. Side rails up, monitors on throughout procedure. See vital signs in flow sheet. Tolerated Procedure well.

## 2020-12-07 ENCOUNTER — Encounter (HOSPITAL_BASED_OUTPATIENT_CLINIC_OR_DEPARTMENT_OTHER): Payer: Self-pay | Admitting: General Surgery

## 2020-12-07 DIAGNOSIS — D0511 Intraductal carcinoma in situ of right breast: Secondary | ICD-10-CM | POA: Diagnosis not present

## 2020-12-07 NOTE — Progress Notes (Signed)
Patient ID: Kaitlin [name], female   DOB: 29-Jun-1975, 45 y.o.   MRN: [id number] Doing well, discharge this am, path pending

## 2020-12-07 NOTE — Discharge Summary (Signed)
Physician Discharge Summary  Patient ID: Kaitlin [name] MRN: [id number] DOB/AGE: [birth year] 45 y.o.  Admit date: 12/06/2020 Discharge date: 12/07/2020  Admission Diagnoses: Right breast cancer left breast DCIS  Discharge Diagnoses:  same  Discharged Condition: stable  Hospital Course: Post operatively patient did well with pain controlled, ambulatory with minimal assist and tolerating diet. Instructed on drain care bathing.  Treatments: surgery: bilateral mastectomies right SLN bilateral breast reconstruction with tissue expanders acellular dermis 11.15.22  Discharge Exam: Blood pressure (!) 108/58, pulse 65, temperature 98.4 F (36.9 C), resp. rate 16, height 5\' 7"  (1.702 m), weight 66.5 kg, last menstrual period 12/06/2020, SpO2 99 %, not currently breastfeeding. Incision/Wound: chest soft incisions dry intact drains serosanguinous  Disposition: Discharge disposition: 01-Home or Self Care       Discharge Instructions     Call MD for:  redness, tenderness, or signs of infection (pain, swelling, bleeding, redness, odor or green/yellow discharge around incision site)   Complete by: As directed    Call MD for:  temperature >100.5   Complete by: As directed    Discharge instructions   Complete by: As directed    Ok to remove dressings and shower am 11.17.22 Soap and water ok, pat Tegaderms dry. Do not remove Tegaderms. No creams or ointments over incisions. Do not let drains dangle in shower, attach to lanyard or similar.Strip and record drains twice daily and bring log to clinic visit.  Breast binder or soft compression bra all other times.  Ok to raise arms above shoulders for bathing and dressing.  No house yard work or exercise until cleared by MD.   Recommend ibuprofen with meals to aid with pain control. Also ok to use Tylenol for pain. Patient received all Rx preop. Recommend Miralax or Dulcolax as needed for constipation.   Driving Restrictions   Complete by: As  directed    No driving for 2 weeks then no driving if taking prescription pain medication   Lifting restrictions   Complete by: As directed    No lifting > 5-10 lbs until cleared by MD   Resume previous diet   Complete by: As directed       Allergies as of 12/07/2020       Reactions   Ceclor [cefaclor] Hives, Shortness Of Breath        Medication List     STOP taking these medications    tamoxifen 20 MG tablet Commonly known as: NOLVADEX       TAKE these medications    multivitamin tablet Take 1 tablet by mouth daily.   OVER THE COUNTER MEDICATION Raw Vitamin D 3. 2000 mg 1 tablet daily   OVER THE COUNTER MEDICATION Triphala, 1 tablet daily   OVER THE COUNTER MEDICATION Iron supplement. 30 mg daily.   OVER THE COUNTER MEDICATION Elderberry Syrup. 30 ml daily.   OVER THE COUNTER MEDICATION Probiotic 1 capsule daily        Follow-up Information     Rolm Bookbinder, MD Follow up in 3 day(s).   Specialty: General Surgery Contact information: [address] [place] [address] [phone]         Irene Limbo, MD Follow up in 1 week(s).   Specialty: Plastic Surgery Why: as scheduled Contact information: [place] [address] [place] [address] [phone]                 Signed: Irene Limbo 12/07/2020, 8:00 AM

## 2020-12-07 NOTE — Discharge Instructions (Addendum)
*  You had 1000 mg of Tylenol at 7:05am   About my Jackson-Pratt Bulb Drain  What is a Jackson-Pratt bulb? A Jackson-Pratt is a soft, round device used to collect drainage. It is connected to a long, thin drainage catheter, which is held in place by one or two small stiches near your surgical incision site. When the bulb is squeezed, it forms a vacuum, forcing the drainage to empty into the bulb.  Emptying the Jackson-Pratt bulb- To empty the bulb: 1. Release the plug on the top of the bulb. 2. Pour the bulb's contents into a measuring container which your nurse will provide. 3. Record the time emptied and amount of drainage. Empty the drain(s) as often as your     doctor or nurse recommends.  Date               Time               (Drain 1)            (Drain 2)            (Drain 3)              (Drain 4)         _________________________________________________________________________________________________  _________________________________________________________________________________________________  _________________________________________________________________________________________________  _________________________________________________________________________________________________  _________________________________________________________________________________________________  _________________________________________________________________________________________________  _________________________________________________________________________________________________  _________________________________________________________________________________________________  _________________________________________________________________________________________________  _________________________________________________________________________________________________  _________________________________________________________________________________________________  Squeezing the Jackson-Pratt Bulb- To squeeze the bulb: 1. Make sure the plug at the top of the bulb is open. 2. Squeeze the bulb tightly in your fist. You will hear air squeezing from the bulb. 3. Replace the plug while the bulb is squeezed. 4. Use a safety pin to attach the bulb to your clothing. This will keep the catheter from     pulling at the bulb insertion site.  When to call your doctor- Call your doctor if: Drain site becomes red, swollen or hot. You have a fever greater than 101 degrees F. There is oozing at the drain site. Drain falls out (apply a guaze bandage over the drain hole and secure it with tape). Drainage increases daily not related to activity patterns. (You will usually have more drainage when you are active than when you are resting.) Drainage has a bad odor.

## 2020-12-08 ENCOUNTER — Encounter: Payer: Self-pay | Admitting: *Deleted

## 2020-12-08 LAB — SURGICAL PATHOLOGY

## 2020-12-09 ENCOUNTER — Other Ambulatory Visit (HOSPITAL_COMMUNITY): Payer: Self-pay | Admitting: General Surgery

## 2020-12-09 ENCOUNTER — Other Ambulatory Visit: Payer: Self-pay

## 2020-12-09 ENCOUNTER — Ambulatory Visit (HOSPITAL_COMMUNITY)
Admission: RE | Admit: 2020-12-09 | Discharge: 2020-12-09 | Disposition: A | Discharge status: Home / Self Care | Payer: 59 | Source: Ambulatory Visit | Attending: General Surgery | Admitting: General Surgery

## 2020-12-09 DIAGNOSIS — M7989 Other specified soft tissue disorders: Secondary | ICD-10-CM | POA: Diagnosis not present

## 2020-12-09 DIAGNOSIS — M79605 Pain in left leg: Secondary | ICD-10-CM

## 2020-12-09 NOTE — Progress Notes (Signed)
Left lower extremity venous duplex has been completed. Preliminary results can be found in CV Proc through chart review.  Results were given to [facility] at Dr. Cristal Generous office.  12/09/20 3:26 PM Kaitlin [name] RVT

## 2020-12-12 ENCOUNTER — Encounter: Payer: Self-pay | Admitting: *Deleted

## 2020-12-12 ENCOUNTER — Telehealth: Payer: Self-pay | Admitting: *Deleted

## 2020-12-12 DIAGNOSIS — C50411 Malignant neoplasm of upper-outer quadrant of right female breast: Secondary | ICD-10-CM | POA: Insufficient documentation

## 2020-12-12 NOTE — Telephone Encounter (Signed)
Ordered mammaprint per Dr. Lindi Adie. Faxed requisition to pathology and [facility].

## 2020-12-19 NOTE — Assessment & Plan Note (Signed)
12/06/20: Bilateral Mastectomies:  Left Mastectomy:HG DCIS 2.2 cm with necrosis, Margins Neg ER 90%, PR 40% Right Mastectomy: Grade 2 IDC with DCIS 3.8 cm, 1/3 LN ER 100%, PR 100%, Her 2 Neg, Ki 67: 2-15%  Pathology counseling: I discussed the final pathology report of the patient provided  a copy of this report. I discussed the margins as well as lymph node surgeries. We also discussed the final staging along with previously performed ER/PR and HER-2/neu testing.  Treatment Plan: 1. Mammaprint to determine benefit on chemo 2. Adjuvant XRT 3. Adjuvant Anti-estrogen therapy  RTC based on Mammaprint test results

## 2020-12-19 NOTE — Progress Notes (Signed)
Patient Care Team: [name], MD as PCP - General (Family Medicine) [name], RN as Oncology Nurse Navigator [name], RN as Oncology Nurse Navigator [name], MD as Consulting Physician (Hematology and Oncology)  DIAGNOSIS:    ICD-10-CM   1. Bilateral malignant neoplasm of breast in female, estrogen receptor positive, unspecified site of breast ([place])  C50.911    Z17.0    C50.912       SUMMARY OF ONCOLOGIC HISTORY: Oncology History  Bilateral breast cancer ([place])  11/01/2020 Initial Diagnosis   Right breast biopsy UOQ 9:30 position: Grade 2 IDC with DCIS ER 100%, PR [place], Ki-67 2%, HER2 negative Right breast biopsy LIQ: Grade 1 IDC with DCIS Left breast biopsy UOQ: DCIS intermediate grade    11/01/2020 Cancer Staging   Staging form: Breast, AJCC 8th Edition - Clinical: Stage IIA (cT3, cN0, cM0, G2, ER+, PR+, HER2-) - Signed by [name], MD on 11/01/2020 Stage prefix: Initial diagnosis Histologic grading system: 3 grade system    11/16/2020 Genetic Testing   Negative genetic testing on the CancerNext-Expanded+RNAinsight.  The report date is 11/16/2020  The CancerNext-Expanded gene panel offered by [facility] and includes sequencing and rearrangement analysis for the following 77 genes: AIP, ALK, APC*, ATM*, AXIN2, BAP1, BARD1, BLM, BMPR1A, BRCA1*, BRCA2*, BRIP1*, CDC73, CDH1*, CDK4, CDKN1B, CDKN2A, CHEK2*, CTNNA1, DICER1, FANCC, FH, FLCN, GALNT12, KIF1B, LZTR1, MAX, MEN1, MET, MLH1*, MSH2*, MSH3, MSH6*, MUTYH*, NBN, NF1*, NF2, NTHL1, PALB2*, PHOX2B, PMS2*, POT1, PRKAR1A, PTCH1, PTEN*, RAD51C*, RAD51D*, RB1, RECQL, RET, SDHA, SDHAF2, SDHB, SDHC, SDHD, SMAD4, SMARCA4, SMARCB1, SMARCE1, STK11, SUFU, TMEM127, TP53*, TSC1, TSC2, VHL and XRCC2 (sequencing and deletion/duplication); EGFR, EGLN1, HOXB13, KIT, MITF, PDGFRA, POLD1, and POLE (sequencing only); EPCAM and GREM1 (deletion/duplication only). DNA and RNA analyses performed for * genes.     12/06/2020 Surgery   Bilateral Mastectomies:  Left Mastectomy:HG DCIS 2.2 cm with necrosis, Margins Neg ER 90%, PR 40% Right Mastectomy: Grade 2 IDC with DCIS 3.8 cm, 1/3 LN ER 100%, PR 100%, Her 2 Neg, Ki 67: 2-15%     CHIEF COMPLIANT: Follow-up of bilateral breast cancer  INTERVAL HISTORY: Kaitlin [name] is a 45 y.o. with above-mentioned history of bilateral breast cancer. Bilateral mastectomies on 12/06/2020 showed intermediate to high grade DCIS with necrosis and calcifications in the left breast, grade 2 invasive ductal carcinoma and intermediate grade DCIS, and one right axillary lymph node positive for metastatic carcinoma. She presents to the clinic today for follow-up.    ALLERGIES:  is allergic to ceclor [cefaclor].  MEDICATIONS:  Current Outpatient Medications  Medication Sig Dispense Refill   Multiple Vitamin (MULTIVITAMIN) tablet Take 1 tablet by mouth daily.     OVER THE COUNTER MEDICATION Raw Vitamin D 3. 2000 mg 1 tablet daily     OVER THE COUNTER MEDICATION Triphala, 1 tablet daily     OVER THE COUNTER MEDICATION Iron supplement. 30 mg daily.     OVER THE COUNTER MEDICATION Elderberry Syrup. 30 ml daily.     OVER THE COUNTER MEDICATION Probiotic 1 capsule daily     No current facility-administered medications for this visit.    PHYSICAL EXAMINATION: ECOG PERFORMANCE STATUS: 1 - Symptomatic but completely ambulatory  Vitals:   12/20/20 0912  BP: 118/74  Pulse: 87  Resp: 17  Temp: 97.9 F (36.6 C)  SpO2: 100%   Filed Weights   12/20/20 0912  Weight: 143 lb 4.8 oz (65 kg)      LABORATORY DATA:  I  have reviewed the data as listed CMP Latest Ref Rng & Units 04/06/2011  Glucose 70 - 99 mg/dL 89  BUN 6 - 23 mg/dL 11  Creatinine 0.4 - 1.2 mg/dL 0.7  Sodium 135 - 145 mEq/L 140  Potassium 3.5 - 5.1 mEq/L 4.4  Chloride 96 - 112 mEq/L 107  CO2 19 - 32 mEq/L 28  Calcium 8.4 - 10.5 mg/dL 9.3    Lab Results  Component Value Date   WBC 17.7 (H)  03/21/2012   HGB 11.4 (L) 03/21/2012   HCT 33.4 (L) 03/21/2012   MCV 91.0 03/21/2012   PLT 267 03/21/2012   NEUTROABS 2.0 04/06/2011    ASSESSMENT & PLAN:  Bilateral breast cancer ([place]) 12/06/20: Bilateral Mastectomies:  Left Mastectomy:HG DCIS 2.2 cm with necrosis, Margins Neg ER 90%, PR 40% Right Mastectomy: Grade 2 IDC with DCIS 3.8 cm, 1/3 LN ER 100%, PR 100%, Her 2 Neg, Ki 67: 2-15%  Pathology counseling: I discussed the final pathology report of the patient provided  a copy of this report. I discussed the margins as well as lymph node surgeries. We also discussed the final staging along with previously performed ER/PR and HER-2/neu testing.  Treatment Plan: 1. Mammaprint to determine benefit on chemo 2. Adjuvant XRT 3. Adjuvant Anti-estrogen therapy  Patient is very emotional and anxious about whether or not she needs chemotherapy. RTC based on Mammaprint test results    No orders of the defined types were placed in this encounter.  The patient has a good understanding of the overall plan. she agrees with it. she will call with any problems that may develop before the next visit here.  Total time spent: 30 mins including face to face time and time spent for planning, charting and coordination of care  Rulon Eisenmenger, MD, MPH 12/20/2020  I, Thana Ates, am acting as scribe for Dr. [name].  I have reviewed the above documentation for accuracy and completeness, and I agree with the above.

## 2020-12-20 ENCOUNTER — Encounter (HOSPITAL_COMMUNITY): Payer: Self-pay

## 2020-12-20 ENCOUNTER — Encounter: Payer: Self-pay | Admitting: *Deleted

## 2020-12-20 ENCOUNTER — Inpatient Hospital Stay: Payer: 59 | Attending: Hematology and Oncology | Admitting: Hematology and Oncology

## 2020-12-20 ENCOUNTER — Other Ambulatory Visit: Payer: Self-pay

## 2020-12-20 ENCOUNTER — Telehealth: Payer: Self-pay | Admitting: *Deleted

## 2020-12-20 DIAGNOSIS — Z9013 Acquired absence of bilateral breasts and nipples: Secondary | ICD-10-CM | POA: Insufficient documentation

## 2020-12-20 DIAGNOSIS — C773 Secondary and unspecified malignant neoplasm of axilla and upper limb lymph nodes: Secondary | ICD-10-CM | POA: Diagnosis not present

## 2020-12-20 DIAGNOSIS — Z79899 Other long term (current) drug therapy: Secondary | ICD-10-CM | POA: Insufficient documentation

## 2020-12-20 DIAGNOSIS — C50912 Malignant neoplasm of unspecified site of left female breast: Secondary | ICD-10-CM | POA: Diagnosis not present

## 2020-12-20 DIAGNOSIS — C50911 Malignant neoplasm of unspecified site of right female breast: Secondary | ICD-10-CM

## 2020-12-20 DIAGNOSIS — Z170 Estrogen receptor positive status [ER+]: Secondary | ICD-10-CM | POA: Insufficient documentation

## 2020-12-20 DIAGNOSIS — C50411 Malignant neoplasm of upper-outer quadrant of right female breast: Secondary | ICD-10-CM | POA: Insufficient documentation

## 2020-12-20 DIAGNOSIS — Z881 Allergy status to other antibiotic agents status: Secondary | ICD-10-CM | POA: Diagnosis not present

## 2020-12-20 NOTE — Telephone Encounter (Signed)
Received mammaprint results of low risk.  Left message for a return phone call.

## 2020-12-26 ENCOUNTER — Encounter: Payer: Self-pay | Admitting: *Deleted

## 2020-12-28 ENCOUNTER — Encounter: Payer: Self-pay | Admitting: Rehabilitation

## 2020-12-28 ENCOUNTER — Other Ambulatory Visit: Payer: Self-pay

## 2020-12-28 ENCOUNTER — Ambulatory Visit: Payer: 59 | Attending: General Surgery | Admitting: Rehabilitation

## 2020-12-28 DIAGNOSIS — R293 Abnormal posture: Secondary | ICD-10-CM | POA: Insufficient documentation

## 2020-12-28 DIAGNOSIS — Z170 Estrogen receptor positive status [ER+]: Secondary | ICD-10-CM | POA: Insufficient documentation

## 2020-12-28 DIAGNOSIS — C50911 Malignant neoplasm of unspecified site of right female breast: Secondary | ICD-10-CM | POA: Insufficient documentation

## 2020-12-28 DIAGNOSIS — Z483 Aftercare following surgery for neoplasm: Secondary | ICD-10-CM | POA: Insufficient documentation

## 2020-12-28 DIAGNOSIS — C50912 Malignant neoplasm of unspecified site of left female breast: Secondary | ICD-10-CM | POA: Insufficient documentation

## 2020-12-28 NOTE — Therapy (Signed)
[facility] @ [place], [place], [address] Phone: [phone]   Fax:  [phone]  Physical Therapy Treatment  Patient Details  Name: Kaitlin [name] MRN: [id number] Date of Birth: 28-Dec-1975 Referring Provider (PT): Dr. Donne Hazel   Encounter Date: 12/28/2020   PT End of Session - 12/28/20 1044     Visit Number 2    Number of Visits 6    Date for PT Re-Evaluation 03/28/21    PT Start Time 1000    PT Stop Time 3536    PT Time Calculation (min) 40 min    Activity Tolerance Patient tolerated treatment well    Behavior During Therapy [facility] for tasks assessed/performed             Past Medical History:  Diagnosis Date   Allergy    Anemia    Asthma    Cardiomyopathy ([place])    Chronic headaches    Delivery normal 11/2008   Family history of breast cancer    Family history of colon cancer    Family history of leukemia    Family history of melanoma    Family history of ovarian cancer    Heart murmur    Kidney stone    x of   PONV (postoperative nausea and vomiting)    S/P tonsillectomy    2000    Past Surgical History:  Procedure Laterality Date   BREAST RECONSTRUCTION WITH PLACEMENT OF TISSUE EXPANDER AND ALLODERM Bilateral 12/06/2020   Procedure: BREAST RECONSTRUCTION WITH PLACEMENT OF TISSUE EXPANDER AND ALLODERM;  Surgeon: Irene Limbo, MD;  Location: [place];  Service: Plastics;  Laterality: Bilateral;   LEEP     MASTECTOMY W/ SENTINEL NODE BIOPSY Right 12/06/2020   Procedure: RIGHT MASTECTOMY WITH RIGHT AXILLARY SENTINEL LYMPH NODE BIOPSY;  Surgeon: Rolm Bookbinder, MD;  Location: [place];  Service: General;  Laterality: Right;   tonsil removal  01/22/1998   TOTAL MASTECTOMY Left 12/06/2020   Procedure: LEFT TOTAL MASTECTOMY;  Surgeon: Rolm Bookbinder, MD;  Location: [place];  Service: General;  Laterality: Left;    There were  no vitals filed for this visit.   Subjective Assessment - 12/28/20 0957     Subjective I got those expanders and they are horrible. I will meet with Dr. Lisbeth Renshaw on 01/03/21.    Pertinent History Bil mastectomy with immediate reconstruction 12/06/20 with 1/3 LN positive.  Rt Grade 2 IDC with DCIS ER positive.  Lt breast DCIS intermediate.  No chemotherapy needed.  Radiation to follow.    Currently in Pain? No/denies   I get some pain in the back of the Rt shoulder blade but that is a stress spot all of the time               [facility] PT Assessment - 12/28/20 0001       Assessment   Medical Diagnosis bil breast cancer    Referring Provider (PT) Dr. Donne Hazel    Onset Date/Surgical Date 12/06/20    Hand Dominance Right      Precautions   Precaution Comments lymphedema Rt UE      Restrictions   Weight Bearing Restrictions No      Balance Screen   Has the patient fallen in the past 6 months No    Has the patient had a decrease in activity level because of a fear of falling?  No    Is the patient  reluctant to leave their home because of a fear of falling?  No      Home Environment   Living Environment Private residence    Living Arrangements Spouse/significant other;Children    Available Help at Discharge Family      Prior Function   Level of Independence Independent    Vocation Full time employment    Vocation Requirements husband is doing the lifting for now      Cognition   Overall Cognitive Status Within Functional Limits for tasks assessed      Observation/Other Assessments   Observations healing incisions bilaterally with Tjunction slightly open on the left side with some serosanguinous drainage into the bra lately.  No cording evident      AROM   Right Shoulder Extension 35 Degrees    Right Shoulder Flexion 130 Degrees    Right Shoulder ABduction 140 Degrees    Left Shoulder Extension 40 Degrees    Left Shoulder Flexion 140 Degrees    Left Shoulder ABduction 150  Degrees                                         PT Long Term Goals - 11/08/20 1454       PT LONG TERM GOAL #1   Title Pt will return to baseline AROM    Time 8    Period Weeks    Status New      PT LONG TERM GOAL #2   Title Pt will be scheduled for SOZO follow up    Time 8    Period Weeks    Status New                   Plan - 12/28/20 1044     Clinical Impression Statement Pt returns 3 weeks post bil mastectomy with 3 LN removed on the Rt with good healing status.  Slightly open T junction on the left chest so pt will avoid agressive stretching on this side until fully healed.  Advanced TE to include supine flexion and chest stretch and to include wall flexion as well as abduction.  Discussed scar massage and stretching/exercise during and after radiation.  Pt will return in 2 weeks for another check in to make sure she has returned to baseline AROM.    Rehab Potential Excellent    PT Frequency 1x / week    PT Duration 12 weeks    PT Treatment/Interventions ADLs/Self Care Home Management;Therapeutic exercise;Patient/family education;Manual techniques    PT Next Visit Plan recheck AROM, provide PROM/pulleys    Consulted and Agree with Plan of Care Patient             Patient will benefit from skilled therapeutic intervention in order to improve the following deficits and impairments:     Visit Diagnosis: Abnormal posture  Bilateral malignant neoplasm of breast in female, estrogen receptor positive, unspecified site of breast [facility])  Aftercare following surgery for neoplasm     Problem List Patient Active Problem List   Diagnosis Date Noted   Malignant neoplasm of upper-outer quadrant of right breast in female, estrogen receptor positive ([place]) 12/12/2020   S/P bilateral mastectomy 12/06/2020   Genetic testing 11/17/2020   Family history of breast cancer 11/02/2020   Family history of ovarian cancer 11/02/2020   Family  history of colon cancer 11/02/2020   Family history of melanoma 11/02/2020  Family history of leukemia 11/02/2020   Bilateral breast cancer ([place]) 11/01/2020   LLQ abdominal pain 04/06/2011    Stark Bray, PT 12/28/2020, 10:48 AM  [facility] @ [place], [place], [address] Phone: [phone]   Fax:  [phone]  Name: ELYSHIA KUMAGAI MRN: [id number] Date of Birth: 1975-08-05

## 2020-12-28 NOTE — Patient Instructions (Signed)
[facility]  [address], [address]  [place] [address]  [phone]  After Breast Cancer Class It is recommended you attend the ABC class to be educated on lymphedema risk reduction. This class is free of charge and lasts for 1 hour. It is a 1-time class. You will need to download the Webex app either on your phone or computer. We will send you a link the night before or the morning of the class. You should be able to click on that link to join the class. This is not a confidential class. You don't have to turn your camera on, but other participants may be able to see your email address.  Scar massage You can begin gentle scar massage to you incision sites. Gently place one hand on the incision and move the skin (without sliding on the skin) in various directions. Do this for a few minutes and then you can gently massage either coconut oil or vitamin E cream into the scars.  Compression garment You should continue wearing your compression bra until you feel like you no longer have swelling.  Home exercise Program Continue doing the exercises you were given until you feel like you can do them without feeling any tightness at the end.   Walking Program Studies show that 30 minutes of walking per day (fast enough to elevate your heart rate) can significantly reduce the risk of a cancer recurrence. If you can't walk due to other medical reasons, we encourage you to find another activity you could do (like a stationary bike or water exercise).  Posture After breast cancer surgery, people frequently sit with rounded shoulders posture because it puts their incisions on slack and feels better. If you sit like this and scar tissue forms in that position, you can become very tight and have pain sitting or standing with good posture. Try to be aware of your posture and sit and stand up tall to heal properly.  Follow up PT: It is recommended you return every 3 months for the  first 3 years following surgery to be assessed on the SOZO machine for an L-Dex score. This helps prevent clinically significant lymphedema in 95% of patients. These follow up screens are 10 minute appointments that you are not billed for. 

## 2020-12-29 NOTE — Progress Notes (Signed)
New Breast Cancer Diagnosis: Bilateral Breast Cancer  Did patient present with symptoms (if so, please note symptoms) or screening mammography?: She notes burning pain in the upper outer right breast.  Diagnostic imaging showed concerns for a mass at the location of the palpable abnormality.   Location and Extent of disease :right breast. Located at the 9:30 position, measured  3.3 cm.  A satellite lesion noted at 9:30 position was also noted measuring 5 mm approximately 9 mm from the larger mass. There was multiple groups of calcifications in the upper outer, inner, and lower inner quadrants of the right breast.  The group slightly lower inner was the furthest from the calcifications associated with the mass spanning 1.5 cm.  A 2.5 cm group of indeterminate calcifications in the upper outer quadrant of the left breast were also noted.  Her axilla was negative for adenopathy.  Pathology Report from Bilateral Mastectomies 12/06/2020   Histology per Pathology Report: Right Breast Lower Inner Quadrant showed grade 1, Invasive Ductal Carcinoma with associated DCIS and necrosis.  10/27/2020  Receptor Status: ER(positive), PR (positive), Her2-neu (negative), Ki-(15%)   Histology per Pathology Report: Left Breast Calcifications showed intermediate grade, DCIS with calcifications and necrosis. 10/27/2020  Receptor Status: ER(positive), PR (positive), Her2-neu (), Ki-(%)   Histology per Pathology Report: Right Breast 9:30 showed grade 2, Invasive Ductal Carcinoma with associated DCIS. 10/24/2020  Receptor Status: ER(positive), PR (positive), Her2-neu (negative), Ki-(2%)   Surgeon and surgical plan, if any:  Dr. Donne Hazel -Bilateral Mastectomies 12/06/2020 -Bilateral tissue expanders placed 12/06/2020     -12/28/2020- first fill to expanders 200 cc, will do another 50 when she feels comfortable.   Medical oncologist, treatment if any:   Dr. Lindi Adie 12/20/2020 Treatment Plan: 1. Mammaprint to  determine benefit on chemo 2. Adjuvant XRT 3. Adjuvant Anti-estrogen therapy   Family History of Breast/Ovarian/Prostate Cancer: Paternal uncle had prostate, MGM's sister had ovarian,  Lymphedema issues, if any:  Feels some tightness to her skin when lifting her right arm.    Pain issues, if any: Has mild tenderness due to having tissue expanders filled this week.  SAFETY ISSUES: Prior radiation? Granuloma Anularry PUVA, Light therapy, in [place] 15 years ago. Pacemaker/ICD? No Possible current pregnancy? Having cycles, Husband had Vasectomy Is the patient on methotrexate? No  Current Complaints / other details:

## 2021-01-02 ENCOUNTER — Encounter: Payer: 59 | Admitting: Plastic Surgery

## 2021-01-02 ENCOUNTER — Ambulatory Visit (INDEPENDENT_AMBULATORY_CARE_PROVIDER_SITE_OTHER): Payer: 59 | Admitting: Plastic Surgery

## 2021-01-02 ENCOUNTER — Other Ambulatory Visit: Payer: Self-pay

## 2021-01-02 VITALS — BP 115/78 | HR 89 | Ht 67.5 in | Wt 146.0 lb

## 2021-01-02 DIAGNOSIS — Z170 Estrogen receptor positive status [ER+]: Secondary | ICD-10-CM | POA: Diagnosis not present

## 2021-01-02 DIAGNOSIS — C50411 Malignant neoplasm of upper-outer quadrant of right female breast: Secondary | ICD-10-CM | POA: Diagnosis not present

## 2021-01-02 DIAGNOSIS — Z9013 Acquired absence of bilateral breasts and nipples: Secondary | ICD-10-CM | POA: Diagnosis not present

## 2021-01-03 ENCOUNTER — Encounter: Payer: Self-pay | Admitting: Radiation Oncology

## 2021-01-03 ENCOUNTER — Ambulatory Visit
Admission: RE | Admit: 2021-01-03 | Discharge: 2021-01-03 | Disposition: A | Discharge status: Home / Self Care | Payer: 59 | Source: Ambulatory Visit | Attending: Radiation Oncology | Admitting: Radiation Oncology

## 2021-01-03 ENCOUNTER — Encounter: Payer: Self-pay | Admitting: Plastic Surgery

## 2021-01-03 VITALS — BP 121/77 | HR 101 | Temp 97.2°F | Resp 18 | Ht 67.5 in | Wt 147.0 lb

## 2021-01-03 DIAGNOSIS — Z170 Estrogen receptor positive status [ER+]: Secondary | ICD-10-CM

## 2021-01-03 DIAGNOSIS — Z803 Family history of malignant neoplasm of breast: Secondary | ICD-10-CM | POA: Diagnosis not present

## 2021-01-03 DIAGNOSIS — Z87891 Personal history of nicotine dependence: Secondary | ICD-10-CM | POA: Insufficient documentation

## 2021-01-03 DIAGNOSIS — Z806 Family history of leukemia: Secondary | ICD-10-CM | POA: Diagnosis not present

## 2021-01-03 DIAGNOSIS — C50411 Malignant neoplasm of upper-outer quadrant of right female breast: Secondary | ICD-10-CM

## 2021-01-03 DIAGNOSIS — D0512 Intraductal carcinoma in situ of left breast: Secondary | ICD-10-CM | POA: Diagnosis not present

## 2021-01-03 DIAGNOSIS — D649 Anemia, unspecified: Secondary | ICD-10-CM | POA: Diagnosis not present

## 2021-01-03 DIAGNOSIS — J45909 Unspecified asthma, uncomplicated: Secondary | ICD-10-CM | POA: Diagnosis not present

## 2021-01-03 DIAGNOSIS — Z800 Family history of malignant neoplasm of digestive organs: Secondary | ICD-10-CM | POA: Diagnosis not present

## 2021-01-03 NOTE — Progress Notes (Signed)
Radiation Oncology         [phone] ________________________________  Name: Kaitlin [name]        MRN: [id number]  Date of Service: 01/03/2021 DOB: 09/15/1975  [id number], Hal Hope, MD  [name], MD     REFERRING PHYSICIAN: [name], MD   DIAGNOSIS: The primary encounter diagnosis was Malignant neoplasm of upper-outer quadrant of right breast in female, estrogen receptor positive ([place]). A diagnosis of Ductal carcinoma in situ (DCIS) of left breast was also pertinent to this visit.   HISTORY OF PRESENT ILLNESS: Kaitlin [name] is a 45 y.o. female seen at the request of Dr. Lindi Adie for a new diagnosis of bilateral breast cancer.  The patient presented after burning pain in the upper outer right breast.  Diagnostic imaging showed concerns for a mass at the location of the palpable abnormality and by ultrasound at the 9:30 position measuring 3.3 cm.  A satellite lesion and then 930 o'clock position was also noted measuring 5 mm approximately 9 mm from the larger mass.  There were multiple groups of calcifications as well suspicious for additional disease in the upper outer upper inner and lower inner quadrants of the right breast the group in the slightly lower inner quadrant was furthest from the calcifications associated with the mass spanning 1.5 cm.  A 2.5 cm group of indeterminate calcifications in the upper outer quadrant of the left breast were also noted her axilla was negative for adenopathy.  She subsequently underwent biopsies of her right breast and left breast.  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  The cancer was ER/PR positive, HER2 negative with a Ki-67 of 2%.  A second biopsy on 10/27/20  of the right breast lower inner quadrant showed invasive ductal carcinoma grade 1 with associated DCIS and necrosis.  This was ER/PR positive HER2 negative with a Ki-67 of 15%.  The left breast  calcifications were also sampled that day and showed intermediate grade DCIS with calcifications and necrosis this was ER/PR positive.  With these findings, she had MammaPrint testing that showed she was low risk.  On 12/06/20 she proceeded with bilateral mastectomies and placement of bilateral tissue expanders.  Final pathology from surgery showed intermediate to high-grade DCIS with necrosis and calcifications of the left breast specimen the area spanned 2.2 cm without any invasive component.  Her resection margins were negative.  Her right breast specimen revealed grade 2 invasive ductal carcinoma measuring 3.8 cm, DCIS intermediate grade was focally noted involving a small papillary lesion at the site of her prior biopsy.  Her resection margins were negative with her deep margin 2 mm from invasive disease and DCIS focally involve the anterior margin and less than 1 mm to the deep margin.  She had 3 sentinel lymph nodes examined 1 of which contained metastatic disease.  Given her node positive findings, she is seen to discuss adjuvant postmastectomy radiotherapy.  She did meet back with Dr. Quintella Reichert following surgery but again her MammaPrint was low risk and no systemic chemotherapy is planned.     PREVIOUS RADIATION THERAPY: No   PAST MEDICAL HISTORY:  Past Medical History:  Diagnosis Date   Allergy    Anemia    Asthma    Cardiomyopathy ([place])    Chronic headaches    Delivery normal 11/2008   Family history of breast cancer    Family history of colon cancer  Family history of leukemia    Family history of melanoma    Family history of ovarian cancer    Heart murmur    Kidney stone    x of   PONV (postoperative nausea and vomiting)    S/P tonsillectomy    2000       PAST SURGICAL HISTORY: Past Surgical History:  Procedure Laterality Date   BREAST RECONSTRUCTION WITH PLACEMENT OF TISSUE EXPANDER AND ALLODERM Bilateral 12/06/2020   Procedure: BREAST RECONSTRUCTION WITH PLACEMENT OF  TISSUE EXPANDER AND ALLODERM;  Surgeon: Irene Limbo, MD;  Location: [place];  Service: Plastics;  Laterality: Bilateral;   LEEP     MASTECTOMY W/ SENTINEL NODE BIOPSY Right 12/06/2020   Procedure: RIGHT MASTECTOMY WITH RIGHT AXILLARY SENTINEL LYMPH NODE BIOPSY;  Surgeon: Rolm Bookbinder, MD;  Location: [place];  Service: General;  Laterality: Right;   tonsil removal  01/22/1998   TOTAL MASTECTOMY Left 12/06/2020   Procedure: LEFT TOTAL MASTECTOMY;  Surgeon: Rolm Bookbinder, MD;  Location: [place];  Service: General;  Laterality: Left;     FAMILY HISTORY:  Family History  Problem Relation Age of Onset   Memory loss Mother    Ulcerative colitis Mother    Colon polyps Father    Irritable bowel syndrome Sister    Leukemia Maternal Aunt 80       AML   Melanoma Maternal Aunt 16   Breast cancer Paternal Aunt 70   Colon cancer Paternal Aunt    Leukemia Paternal Aunt 36   Prostate cancer Paternal Uncle 46   Colon polyps Paternal Uncle    Cervical cancer Maternal Grandmother    Parkinson's disease Maternal Grandmother    Alzheimer's disease Maternal Grandmother    Multiple myeloma Maternal Grandfather    Skin cancer Maternal Grandfather    Skin cancer Paternal Grandmother    Colon cancer Paternal Grandfather 36   Stroke Paternal Grandfather    Ovarian cancer Other 43       MGMs sister   Esophageal cancer Neg Hx    Rectal cancer Neg Hx    Stomach cancer Neg Hx      SOCIAL HISTORY:  reports that she has quit smoking. Her smoking use included cigarettes. She has never used smokeless tobacco. She reports current alcohol use. She reports that she does not use drugs. The patient is married and lives in [place]. She owns an herbal nutrition company. She and her husband have teenage sons.   ALLERGIES: Ceclor [cefaclor]   MEDICATIONS:  Current Outpatient Medications  Medication Sig Dispense Refill   Multiple Vitamin  (MULTIVITAMIN) tablet Take 1 tablet by mouth daily.     OVER THE COUNTER MEDICATION Raw Vitamin D 3. 2000 mg 1 tablet daily     OVER THE COUNTER MEDICATION Triphala, 1 tablet daily     OVER THE COUNTER MEDICATION Iron supplement. 30 mg daily.     OVER THE COUNTER MEDICATION Elderberry Syrup. 30 ml daily.     OVER THE COUNTER MEDICATION Probiotic 1 capsule daily     No current facility-administered medications for this encounter.     REVIEW OF SYSTEMS: On review of systems, the patient reports that she is doing fairly well. She feels like her skin is healing nicely but she is having pain since her expansion in her chest wall, particularly at her left medial chest. She describes this as a burning sensation that prevents her from leaning over to tie her shoes. She  is meeting back with plastics tomorrow. She needs an additional expansion in order to complete this, but is contemplating how much further she wants to go. No other complaints are noted.      PHYSICAL EXAM:  Wt Readings from Last 3 Encounters:  01/02/21 146 lb (66.2 kg)  12/20/20 143 lb 4.8 oz (65 kg)  12/06/20 146 lb 9.7 oz (66.5 kg)   Temp Readings from Last 3 Encounters:  12/20/20 97.9 F (36.6 C) (Temporal)  12/07/20 98.4 F (36.9 C)  11/01/20 98.1 F (36.7 C) (Temporal)   BP Readings from Last 3 Encounters:  01/02/21 115/78  12/20/20 118/74  12/07/20 (!) 108/58   Pulse Readings from Last 3 Encounters:  01/02/21 89  12/20/20 87  12/07/20 65    In general this is a well appearing caucasian female in no acute distress. She's alert and oriented x4 and appropriate throughout the examination. Cardiopulmonary assessment is negative for acute distress and she exhibits normal effort. Breast exam is deferred.    ECOG = 1  0 - Asymptomatic (Fully active, able to carry on all predisease activities without restriction)  1 - Symptomatic but completely ambulatory (Restricted in physically strenuous activity but ambulatory  and able to carry out work of a light or sedentary nature. For example, light housework, office work)  2 - Symptomatic, <50% in bed during the day (Ambulatory and capable of all self care but unable to carry out any work activities. Up and about more than 50% of waking hours)  3 - Symptomatic, >50% in bed, but not bedbound (Capable of only limited self-care, confined to bed or chair 50% or more of waking hours)  4 - Bedbound (Completely disabled. Cannot carry on any self-care. Totally confined to bed or chair)  5 - Death   Eustace Pen MM, Creech RH, Tormey DC, et al. [phone]). "Toxicity and response criteria of the [facility]". [place] Oncol. 5 (6): 649-55    LABORATORY DATA:  Lab Results  Component Value Date   WBC 17.7 (H) 03/21/2012   HGB 11.4 (L) 03/21/2012   HCT 33.4 (L) 03/21/2012   MCV 91.0 03/21/2012   PLT 267 03/21/2012   Lab Results  Component Value Date   NA 140 04/06/2011   K 4.4 04/06/2011   CL 107 04/06/2011   CO2 28 04/06/2011   No results found for: ALT, AST, GGT, ALKPHOS, BILITOT    RADIOGRAPHY: VAS [place] LOWER EXTREMITY VENOUS (DVT)  Result Date: 12/10/2020  Lower Venous DVT Study Patient Name:  TORII ROYSE  Date of Exam:   12/09/2020 Medical Rec #: [id number]         Accession #:    [id number] Date of Birth: 1975-05-16        Patient Gender: F Patient Age:   27 years Exam Location:  [facility] Procedure:      VAS [place] LOWER EXTREMITY VENOUS (DVT) Referring Phys: MATTHEW WAKEFIELD --------------------------------------------------------------------------------  Indications: Pain, and Swelling.  Risk Factors: Cancer. Comparison Study: No prior studies. Performing Technologist: Oliver Hum RVT  Examination Guidelines: A complete evaluation includes B-mode imaging, spectral Doppler, color Doppler, and power Doppler as needed of all accessible portions of each vessel. Bilateral testing is considered an integral part of a complete  examination. Limited examinations for reoccurring indications may be performed as noted. The reflux portion of the exam is performed with the patient in reverse Trendelenburg.  +-----+---------------+---------+-----------+----------+--------------+ RIGHTCompressibilityPhasicitySpontaneityPropertiesThrombus Aging +-----+---------------+---------+-----------+----------+--------------+ CFV  Full  Yes      Yes                                 +-----+---------------+---------+-----------+----------+--------------+   +---------+---------------+---------+-----------+----------+--------------+ LEFT     CompressibilityPhasicitySpontaneityPropertiesThrombus Aging +---------+---------------+---------+-----------+----------+--------------+ CFV      Full           Yes      Yes                                 +---------+---------------+---------+-----------+----------+--------------+ SFJ      Full                                                        +---------+---------------+---------+-----------+----------+--------------+ FV Prox  Full                                                        +---------+---------------+---------+-----------+----------+--------------+ FV Mid   Full                                                        +---------+---------------+---------+-----------+----------+--------------+ FV DistalFull                                                        +---------+---------------+---------+-----------+----------+--------------+ PFV      Full                                                        +---------+---------------+---------+-----------+----------+--------------+ POP      Full           Yes      Yes                                 +---------+---------------+---------+-----------+----------+--------------+ PTV      Full                                                         +---------+---------------+---------+-----------+----------+--------------+ PERO     Full                                                        +---------+---------------+---------+-----------+----------+--------------+    Summary:  RIGHT: - No evidence of common femoral vein obstruction.  LEFT: - There is no evidence of deep vein thrombosis in the lower extremity.  - No cystic structure found in the popliteal fossa.  *See table(s) above for measurements and observations. Electronically signed by Jamelle Haring on 12/10/2020 at 11:08:44 AM.    Final        IMPRESSION/PLAN: 1. 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 Dr. Lisbeth Renshaw discusses the pathology findings and reviews the nature of bilateral breast disease, specifically right sided node positive findings. She has completed surgical resection and does not need systemic therapy due to low risk mammaprint results. Dr. Lisbeth Renshaw discusses the rationale for external radiotherapy to the right chest wall and regional nodes to reduce risks of local recurrence followed by antiestrogen therapy. We discussed the risks, benefits, short, and long term effects of radiotherapy, as well as the curative intent, and the patient is interested in proceeding. Dr. Lisbeth Renshaw discusses the delivery and logistics of radiotherapy and anticipates a course of 6 1/2 weeks of radiotherapy to the right chest wall and regional nodes. She will receive a call to coordinate simulation by our department, but start treatment in either the first or second week of January 2023. 2. Plans for reconstruction. She will follow up with Dr. Eulis Manly tomorrow to determine how she'd like to proceed with completing her expansion. 3. Contraceptive Counseling. The patient's husband's vasectomy is there method of birth control. We reviewed the rationale for avoiding pregnancy during treatment but no pregnancy testing is  needed.    In a visit lasting 90 minutes, greater than 50% of the time was spent face to face reviewing her case, as well as in preparation of, discussing, and coordinating the patient's care.  The above documentation reflects my direct findings during this shared patient visit. Please see the separate note by Dr. Lisbeth Renshaw on this date for the remainder of the patient's plan of care.    Carola Rhine, [facility]    **Disclaimer: This note was dictated with voice recognition software. Similar sounding words can inadvertently be transcribed and this note may contain transcription errors which may not have been corrected upon publication of note.**

## 2021-01-03 NOTE — Progress Notes (Signed)
Patient ID: Kaitlin [name], female    DOB: Sep 14, 1975, 45 y.o.   MRN: [id number]   Chief Complaint  Patient presents with   Advice Only   Breast Problem    The patient is a 45 year old female here with her husband for a second opinion on breast  reconstruction.  Approximately 3 months ago the patient was diagnosed with bilateral breast cancer.  She had a palpable right breast mass that was diagnosed as invasive ductal carcinoma and DCIS of the right breast (3.3 cm in the 9:30 o'clock position).  She also had 1 of 3 lymph nodes positive in the right axilla.  She had calcifications located in the upper outer, upper inner, and lower inner quadrants.  It is estrogen and progesterone positive and HER2 negative.  She also had a left breast biopsy which showed intermediate grade DCIS with additional calcifications and necrosis.  In November the patient underwent bilateral mastectomies with immediate reconstruction using expanders and ADM above the muscle.  Her genetics was negative and she started tamoxifen and is planning on undergoing right-sided radiation.    She is 5 feet 7 inches tall and weighs 146 pounds.  Her preoperative bra size was a 32E.  She stated she would like to be smaller postoperatively.  On exam she has a vertical incision of the breast and a small incision at the inframammary fold from a skin reduction mastectomy.  The incisions are mostly healing well with small area of skin breakdown at the vertical and inframammary juncture.  She was mostly seeking clarification and more details about the process of the reconstruction, size expander and volume.   Review of Systems  Constitutional: Negative.   HENT: Negative.    Eyes: Negative.   Respiratory:  Positive for chest tightness.   Cardiovascular: Negative.   Gastrointestinal: Negative.   Endocrine: Negative.   Genitourinary: Negative.   Musculoskeletal: Negative.   Skin:  Positive for wound.  Hematological: Negative.    Psychiatric/Behavioral: Negative.     Past Medical History:  Diagnosis Date   Allergy    Anemia    Asthma    Cardiomyopathy ([place])    Chronic headaches    Delivery normal 11/2008   Family history of breast cancer    Family history of colon cancer    Family history of leukemia    Family history of melanoma    Family history of ovarian cancer    Heart murmur    Kidney stone    x of   PONV (postoperative nausea and vomiting)    S/P tonsillectomy    2000    Past Surgical History:  Procedure Laterality Date   BREAST RECONSTRUCTION WITH PLACEMENT OF TISSUE EXPANDER AND ALLODERM Bilateral 12/06/2020   Procedure: BREAST RECONSTRUCTION WITH PLACEMENT OF TISSUE EXPANDER AND ALLODERM;  Surgeon: Irene Limbo, MD;  Location: [place];  Service: Plastics;  Laterality: Bilateral;   LEEP     MASTECTOMY W/ SENTINEL NODE BIOPSY Right 12/06/2020   Procedure: RIGHT MASTECTOMY WITH RIGHT AXILLARY SENTINEL LYMPH NODE BIOPSY;  Surgeon: Rolm Bookbinder, MD;  Location: [place];  Service: General;  Laterality: Right;   tonsil removal  01/22/1998   TOTAL MASTECTOMY Left 12/06/2020   Procedure: LEFT TOTAL MASTECTOMY;  Surgeon: Rolm Bookbinder, MD;  Location: [place];  Service: General;  Laterality: Left;      Current Outpatient Medications:    Multiple Vitamin (MULTIVITAMIN) tablet, Take 1 tablet by mouth daily., Disp: ,  Rfl:    OVER THE COUNTER MEDICATION, Raw Vitamin D 3. 2000 mg 1 tablet daily, Disp: , Rfl:    OVER THE COUNTER MEDICATION, Triphala, 1 tablet daily, Disp: , Rfl:    OVER THE COUNTER MEDICATION, Iron supplement. 30 mg daily., Disp: , Rfl:    OVER THE COUNTER MEDICATION, Elderberry Syrup. 30 ml daily., Disp: , Rfl:    OVER THE COUNTER MEDICATION, Probiotic 1 capsule daily, Disp: , Rfl:    Objective:   Vitals:   01/02/21 1512  BP: 115/78  Pulse: 89  SpO2: 97%    Physical Exam Vitals and nursing note reviewed.   Constitutional:      Appearance: Normal appearance.  HENT:     Head: Normocephalic and atraumatic.  Cardiovascular:     Rate and Rhythm: Normal rate.     Pulses: Normal pulses.  Pulmonary:     Effort: Pulmonary effort is normal. No respiratory distress.  Abdominal:     General: There is no distension.     Palpations: Abdomen is soft.     Tenderness: There is no abdominal tenderness.  Musculoskeletal:        General: Tenderness present. No swelling or deformity.  Skin:    General: Skin is warm.     Capillary Refill: Capillary refill takes less than 2 seconds.     Coloration: Skin is not jaundiced or pale.     Findings: No erythema.  Neurological:     Mental Status: She is alert and oriented to person, place, and time.  Psychiatric:        Mood and Affect: Mood normal.        Behavior: Behavior normal.        Thought Content: Thought content normal.        Judgment: Judgment normal.    Assessment & Plan:  S/P bilateral mastectomy  Malignant neoplasm of upper-outer quadrant of right breast in female, estrogen receptor positive ([place])  We had a lengthy discussion about the process of expander selection, fill of the expander and process for selecting an implant.  She is going to think things over and let us know if we can be of any further help.  Pictures were obtained of the patient and placed in the chart with the patient's or guardian's permission.   [place], DO

## 2021-01-06 ENCOUNTER — Encounter: Payer: Self-pay | Admitting: Hematology and Oncology

## 2021-01-10 ENCOUNTER — Encounter: Payer: Self-pay | Admitting: Rehabilitation

## 2021-01-10 ENCOUNTER — Other Ambulatory Visit: Payer: Self-pay

## 2021-01-10 ENCOUNTER — Ambulatory Visit: Payer: 59 | Admitting: Rehabilitation

## 2021-01-10 ENCOUNTER — Encounter: Payer: Self-pay | Admitting: *Deleted

## 2021-01-10 DIAGNOSIS — R293 Abnormal posture: Secondary | ICD-10-CM

## 2021-01-10 DIAGNOSIS — Z483 Aftercare following surgery for neoplasm: Secondary | ICD-10-CM

## 2021-01-10 DIAGNOSIS — Z170 Estrogen receptor positive status [ER+]: Secondary | ICD-10-CM

## 2021-01-10 DIAGNOSIS — C50911 Malignant neoplasm of unspecified site of right female breast: Secondary | ICD-10-CM | POA: Diagnosis present

## 2021-01-10 DIAGNOSIS — C50912 Malignant neoplasm of unspecified site of left female breast: Secondary | ICD-10-CM | POA: Diagnosis present

## 2021-01-10 NOTE — Therapy (Signed)
[facility] @ [place], [place], [address] Phone: [phone]   Fax:  [phone]  Physical Therapy Treatment  Patient Details  Name: Kaitlin [name] MRN: [id number] Date of Birth: January 10, 1976 Referring Provider (PT): Dr. Donne Hazel   Encounter Date: 01/10/2021   PT End of Session - 01/10/21 0932     Visit Number 3    Number of Visits 6    Date for PT Re-Evaluation 03/28/21    PT Start Time 0900    PT Stop Time 0930    PT Time Calculation (min) 30 min    Activity Tolerance Patient tolerated treatment well    Behavior During Therapy [facility] for tasks assessed/performed             Past Medical History:  Diagnosis Date   Allergy    Anemia    Asthma    Cardiomyopathy ([place])    Chronic headaches    Delivery normal 11/2008   Family history of breast cancer    Family history of colon cancer    Family history of leukemia    Family history of melanoma    Family history of ovarian cancer    Heart murmur    Kidney stone    x of   PONV (postoperative nausea and vomiting)    S/P tonsillectomy    2000    Past Surgical History:  Procedure Laterality Date   BREAST RECONSTRUCTION WITH PLACEMENT OF TISSUE EXPANDER AND ALLODERM Bilateral 12/06/2020   Procedure: BREAST RECONSTRUCTION WITH PLACEMENT OF TISSUE EXPANDER AND ALLODERM;  Surgeon: Irene Limbo, MD;  Location: [facility];  Service: Plastics;  Laterality: Bilateral;   LEEP     MASTECTOMY W/ SENTINEL NODE BIOPSY Right 12/06/2020   Procedure: RIGHT MASTECTOMY WITH RIGHT AXILLARY SENTINEL LYMPH NODE BIOPSY;  Surgeon: Rolm Bookbinder, MD;  Location: [facility];  Service: General;  Laterality: Right;   tonsil removal  01/22/1998   TOTAL MASTECTOMY Left 12/06/2020   Procedure: LEFT TOTAL MASTECTOMY;  Surgeon: Rolm Bookbinder, MD;  Location: [employer];  Service: General;  Laterality: Left;    There were  no vitals filed for this visit.   Subjective Assessment - 01/10/21 0902     Subjective I got more fill.  I did 45 ml and then we will do 45 again on Thursday. After the fill it was really tight and not it is better.  Planning appt on 01/18/21. i think I feel good.    Pertinent History Bil mastectomy with immediate reconstruction 12/06/20 with 1/3 LN positive.  Rt Grade 2 IDC with DCIS ER positive.  Lt breast DCIS intermediate.  No chemotherapy needed.  Radiation to follow.    Currently in Pain? No/denies                [facility] PT Assessment - 01/10/21 0001       AROM   Right Shoulder Flexion 160 Degrees    Right Shoulder ABduction 165 Degrees    Left Shoulder Flexion 150 Degrees    Left Shoulder ABduction 165 Degrees                           [facility] Adult PT Treatment/Exercise - 01/10/21 0001       Self-Care   Self-Care Other Self-Care Comments    Other Self-Care Comments  discussed continuing TE during radiation and how it is okay to perform strength  exercises.  Discussed moving through cancer recommendations.  Showed pt doorway stretch to add.  Discussed self care during radiation and return post radiation for another check uin                          PT Long Term Goals - 01/10/21 0934       PT LONG TERM GOAL #1   Title Pt will return to baseline AROM    Status Achieved      PT LONG TERM GOAL #2   Title Pt will be scheduled for [facility] follow up    Status Achieved                   Plan - 01/10/21 0933     Clinical Impression Statement pt has not returned to baseline AROM and beyond.  Will be starting radiation in the next 2 weeks.  Pt will take a break from visits here and return post radiation for return to her normal level of lifting activity.    PT Next Visit Plan recheck post radiation    Consulted and Agree with Plan of Care Patient             Patient will benefit from skilled therapeutic intervention in order to  improve the following deficits and impairments:     Visit Diagnosis: Abnormal posture  Bilateral malignant neoplasm of breast in female, estrogen receptor positive, unspecified site of breast [facility])  Aftercare following surgery for neoplasm     Problem List Patient Active Problem List   Diagnosis Date Noted   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   Malignant neoplasm of upper-outer quadrant of right breast in female, estrogen receptor positive ([place]) 12/12/2020   S/P bilateral mastectomy 12/06/2020   Genetic testing 11/17/2020   Family history of breast cancer 11/02/2020   Family history of ovarian cancer 11/02/2020   Family history of colon cancer 11/02/2020   Family history of melanoma 11/02/2020   Family history of leukemia 11/02/2020   Bilateral breast cancer ([place]) 11/01/2020   LLQ abdominal pain 04/06/2011    Stark Bray, PT 01/10/2021, 9:35 AM  [facility] @ [address] [place], [place], [address] Phone: [phone]   Fax:  [phone]  Name: Kaitlin [name] MRN: [id number] Date of Birth: [birth year]

## 2021-01-18 ENCOUNTER — Ambulatory Visit
Admission: RE | Admit: 2021-01-18 | Discharge: 2021-01-18 | Disposition: A | Discharge status: Home / Self Care | Payer: 59 | Source: Ambulatory Visit | Attending: Radiation Oncology | Admitting: Radiation Oncology

## 2021-01-18 ENCOUNTER — Other Ambulatory Visit: Payer: Self-pay

## 2021-01-18 DIAGNOSIS — Z170 Estrogen receptor positive status [ER+]: Secondary | ICD-10-CM | POA: Insufficient documentation

## 2021-01-18 DIAGNOSIS — C50411 Malignant neoplasm of upper-outer quadrant of right female breast: Secondary | ICD-10-CM | POA: Diagnosis not present

## 2021-01-25 ENCOUNTER — Encounter: Payer: Self-pay | Admitting: *Deleted

## 2021-01-25 DIAGNOSIS — D0512 Intraductal carcinoma in situ of left breast: Secondary | ICD-10-CM | POA: Insufficient documentation

## 2021-01-26 ENCOUNTER — Telehealth: Payer: Self-pay | Admitting: Hematology and Oncology

## 2021-01-26 ENCOUNTER — Other Ambulatory Visit: Payer: Self-pay

## 2021-01-26 ENCOUNTER — Ambulatory Visit
Admission: RE | Admit: 2021-01-26 | Discharge: 2021-01-26 | Disposition: A | Discharge status: Home / Self Care | Payer: 59 | Source: Ambulatory Visit | Attending: Radiation Oncology | Admitting: Radiation Oncology

## 2021-01-26 DIAGNOSIS — D0512 Intraductal carcinoma in situ of left breast: Secondary | ICD-10-CM | POA: Diagnosis not present

## 2021-01-26 NOTE — Telephone Encounter (Signed)
Scheduled per 01/05 scheduled message, patient has been called and voicemail was left.

## 2021-01-27 ENCOUNTER — Ambulatory Visit
Admission: RE | Admit: 2021-01-27 | Discharge: 2021-01-27 | Disposition: A | Discharge status: Home / Self Care | Payer: 59 | Source: Ambulatory Visit | Attending: Radiation Oncology | Admitting: Radiation Oncology

## 2021-01-27 DIAGNOSIS — D0512 Intraductal carcinoma in situ of left breast: Secondary | ICD-10-CM

## 2021-01-27 MED ORDER — RADIAPLEXRX EX GEL: Freq: Once | CUTANEOUS | Status: AC

## 2021-01-27 MED ORDER — ALRA NON-METALLIC DEODORANT (RAD-ONC)
1.0000 "application " | Freq: Once | TOPICAL | Status: AC
  Administered 2021-01-27: 1 via TOPICAL

## 2021-01-27 NOTE — Progress Notes (Signed)
Pt here for patient teaching.  Pt given Radiation and You booklet, skin care instructions, Alra deodorant, and Radiaplex gel.  Reviewed areas of pertinence such as fatigue, hair loss, skin changes, breast tenderness, and breast swelling . Pt able to give teach back of to pat skin and use unscented/gentle soap,apply Radiaplex bid, avoid applying anything to skin within 4 hours of treatment, avoid wearing an under wire bra, and to use an electric razor if they must shave. Pt verbalizes understanding of information given and will contact nursing with any questions or concerns.    Marietta Sikkema M. Blaise Grieshaber RN, BSN      

## 2021-01-30 ENCOUNTER — Ambulatory Visit
Admission: RE | Admit: 2021-01-30 | Discharge: 2021-01-30 | Disposition: A | Discharge status: Home / Self Care | Payer: 59 | Source: Ambulatory Visit | Attending: Radiation Oncology | Admitting: Radiation Oncology

## 2021-01-30 ENCOUNTER — Other Ambulatory Visit: Payer: Self-pay

## 2021-01-30 DIAGNOSIS — D0512 Intraductal carcinoma in situ of left breast: Secondary | ICD-10-CM | POA: Diagnosis not present

## 2021-01-31 ENCOUNTER — Ambulatory Visit
Admission: RE | Admit: 2021-01-31 | Discharge: 2021-01-31 | Disposition: A | Discharge status: Home / Self Care | Payer: 59 | Source: Ambulatory Visit | Attending: Radiation Oncology | Admitting: Radiation Oncology

## 2021-01-31 DIAGNOSIS — D0512 Intraductal carcinoma in situ of left breast: Secondary | ICD-10-CM | POA: Diagnosis not present

## 2021-02-01 ENCOUNTER — Ambulatory Visit
Admission: RE | Admit: 2021-02-01 | Discharge: 2021-02-01 | Disposition: A | Discharge status: Home / Self Care | Payer: 59 | Source: Ambulatory Visit | Attending: Radiation Oncology | Admitting: Radiation Oncology

## 2021-02-01 ENCOUNTER — Other Ambulatory Visit: Payer: Self-pay

## 2021-02-01 DIAGNOSIS — D0512 Intraductal carcinoma in situ of left breast: Secondary | ICD-10-CM | POA: Diagnosis not present

## 2021-02-02 ENCOUNTER — Ambulatory Visit
Admission: RE | Admit: 2021-02-02 | Discharge: 2021-02-02 | Disposition: A | Discharge status: Home / Self Care | Payer: 59 | Source: Ambulatory Visit | Attending: Radiation Oncology | Admitting: Radiation Oncology

## 2021-02-02 DIAGNOSIS — D0512 Intraductal carcinoma in situ of left breast: Secondary | ICD-10-CM | POA: Diagnosis not present

## 2021-02-03 ENCOUNTER — Other Ambulatory Visit: Payer: Self-pay

## 2021-02-03 ENCOUNTER — Ambulatory Visit
Admission: RE | Admit: 2021-02-03 | Discharge: 2021-02-03 | Disposition: A | Discharge status: Home / Self Care | Payer: 59 | Source: Ambulatory Visit | Attending: Radiation Oncology | Admitting: Radiation Oncology

## 2021-02-03 DIAGNOSIS — D0512 Intraductal carcinoma in situ of left breast: Secondary | ICD-10-CM | POA: Diagnosis not present

## 2021-02-06 ENCOUNTER — Ambulatory Visit
Admission: RE | Admit: 2021-02-06 | Discharge: 2021-02-06 | Disposition: A | Discharge status: Home / Self Care | Payer: 59 | Source: Ambulatory Visit | Attending: Radiation Oncology | Admitting: Radiation Oncology

## 2021-02-06 ENCOUNTER — Other Ambulatory Visit: Payer: Self-pay

## 2021-02-06 DIAGNOSIS — D0512 Intraductal carcinoma in situ of left breast: Secondary | ICD-10-CM | POA: Diagnosis not present

## 2021-02-07 ENCOUNTER — Ambulatory Visit
Admission: RE | Admit: 2021-02-07 | Discharge: 2021-02-07 | Disposition: A | Discharge status: Home / Self Care | Payer: 59 | Source: Ambulatory Visit | Attending: Radiation Oncology | Admitting: Radiation Oncology

## 2021-02-07 ENCOUNTER — Other Ambulatory Visit: Payer: Self-pay

## 2021-02-07 DIAGNOSIS — D0512 Intraductal carcinoma in situ of left breast: Secondary | ICD-10-CM | POA: Diagnosis not present

## 2021-02-08 ENCOUNTER — Ambulatory Visit
Admission: RE | Admit: 2021-02-08 | Discharge: 2021-02-08 | Disposition: A | Discharge status: Home / Self Care | Payer: 59 | Source: Ambulatory Visit | Attending: Radiation Oncology | Admitting: Radiation Oncology

## 2021-02-08 DIAGNOSIS — D0512 Intraductal carcinoma in situ of left breast: Secondary | ICD-10-CM | POA: Diagnosis not present

## 2021-02-09 ENCOUNTER — Ambulatory Visit
Admission: RE | Admit: 2021-02-09 | Discharge: 2021-02-09 | Disposition: A | Discharge status: Home / Self Care | Payer: 59 | Source: Ambulatory Visit | Attending: Radiation Oncology | Admitting: Radiation Oncology

## 2021-02-09 ENCOUNTER — Other Ambulatory Visit: Payer: Self-pay

## 2021-02-09 DIAGNOSIS — D0512 Intraductal carcinoma in situ of left breast: Secondary | ICD-10-CM | POA: Diagnosis not present

## 2021-02-10 ENCOUNTER — Ambulatory Visit
Admission: RE | Admit: 2021-02-10 | Discharge: 2021-02-10 | Disposition: A | Discharge status: Home / Self Care | Payer: 59 | Source: Ambulatory Visit | Attending: Radiation Oncology | Admitting: Radiation Oncology

## 2021-02-10 DIAGNOSIS — D0512 Intraductal carcinoma in situ of left breast: Secondary | ICD-10-CM | POA: Diagnosis not present

## 2021-02-13 ENCOUNTER — Other Ambulatory Visit: Payer: Self-pay

## 2021-02-13 ENCOUNTER — Ambulatory Visit
Admission: RE | Admit: 2021-02-13 | Discharge: 2021-02-13 | Disposition: A | Discharge status: Home / Self Care | Payer: 59 | Source: Ambulatory Visit | Attending: Radiation Oncology | Admitting: Radiation Oncology

## 2021-02-13 DIAGNOSIS — D0512 Intraductal carcinoma in situ of left breast: Secondary | ICD-10-CM | POA: Diagnosis not present

## 2021-02-14 ENCOUNTER — Ambulatory Visit
Admission: RE | Admit: 2021-02-14 | Discharge: 2021-02-14 | Disposition: A | Discharge status: Home / Self Care | Payer: 59 | Source: Ambulatory Visit | Attending: Radiation Oncology | Admitting: Radiation Oncology

## 2021-02-14 DIAGNOSIS — D0512 Intraductal carcinoma in situ of left breast: Secondary | ICD-10-CM | POA: Diagnosis not present

## 2021-02-15 ENCOUNTER — Other Ambulatory Visit: Payer: Self-pay

## 2021-02-15 ENCOUNTER — Ambulatory Visit: Admission: RE | Admit: 2021-02-15 | Payer: 59 | Source: Ambulatory Visit

## 2021-02-16 ENCOUNTER — Ambulatory Visit
Admission: RE | Admit: 2021-02-16 | Discharge: 2021-02-16 | Disposition: A | Discharge status: Home / Self Care | Payer: 59 | Source: Ambulatory Visit | Attending: Radiation Oncology | Admitting: Radiation Oncology

## 2021-02-16 DIAGNOSIS — D0512 Intraductal carcinoma in situ of left breast: Secondary | ICD-10-CM | POA: Diagnosis not present

## 2021-02-17 ENCOUNTER — Other Ambulatory Visit: Payer: Self-pay

## 2021-02-17 ENCOUNTER — Ambulatory Visit
Admission: RE | Admit: 2021-02-17 | Discharge: 2021-02-17 | Disposition: A | Discharge status: Home / Self Care | Payer: 59 | Source: Ambulatory Visit | Attending: Radiation Oncology | Admitting: Radiation Oncology

## 2021-02-17 DIAGNOSIS — D0512 Intraductal carcinoma in situ of left breast: Secondary | ICD-10-CM | POA: Diagnosis not present

## 2021-02-20 ENCOUNTER — Other Ambulatory Visit: Payer: Self-pay

## 2021-02-20 ENCOUNTER — Ambulatory Visit
Admission: RE | Admit: 2021-02-20 | Discharge: 2021-02-20 | Disposition: A | Discharge status: Home / Self Care | Payer: 59 | Source: Ambulatory Visit | Attending: Radiation Oncology | Admitting: Radiation Oncology

## 2021-02-20 DIAGNOSIS — D0512 Intraductal carcinoma in situ of left breast: Secondary | ICD-10-CM | POA: Diagnosis not present

## 2021-02-21 ENCOUNTER — Ambulatory Visit
Admission: RE | Admit: 2021-02-21 | Discharge: 2021-02-21 | Disposition: A | Discharge status: Home / Self Care | Payer: 59 | Source: Ambulatory Visit | Attending: Radiation Oncology | Admitting: Radiation Oncology

## 2021-02-21 DIAGNOSIS — D0512 Intraductal carcinoma in situ of left breast: Secondary | ICD-10-CM | POA: Diagnosis not present

## 2021-02-22 ENCOUNTER — Other Ambulatory Visit: Payer: Self-pay

## 2021-02-22 ENCOUNTER — Ambulatory Visit
Admission: RE | Admit: 2021-02-22 | Discharge: 2021-02-22 | Disposition: A | Discharge status: Home / Self Care | Payer: 59 | Source: Ambulatory Visit | Attending: Radiation Oncology | Admitting: Radiation Oncology

## 2021-02-22 DIAGNOSIS — C50912 Malignant neoplasm of unspecified site of left female breast: Secondary | ICD-10-CM | POA: Diagnosis not present

## 2021-02-22 DIAGNOSIS — Z9013 Acquired absence of bilateral breasts and nipples: Secondary | ICD-10-CM | POA: Diagnosis not present

## 2021-02-22 DIAGNOSIS — Z170 Estrogen receptor positive status [ER+]: Secondary | ICD-10-CM | POA: Diagnosis not present

## 2021-02-22 DIAGNOSIS — D0512 Intraductal carcinoma in situ of left breast: Secondary | ICD-10-CM | POA: Insufficient documentation

## 2021-02-22 DIAGNOSIS — Z79810 Long term (current) use of selective estrogen receptor modulators (SERMs): Secondary | ICD-10-CM | POA: Diagnosis not present

## 2021-02-22 DIAGNOSIS — Z510 Encounter for antineoplastic radiation therapy: Secondary | ICD-10-CM | POA: Diagnosis not present

## 2021-02-22 DIAGNOSIS — C50411 Malignant neoplasm of upper-outer quadrant of right female breast: Secondary | ICD-10-CM | POA: Diagnosis present

## 2021-02-22 DIAGNOSIS — Z923 Personal history of irradiation: Secondary | ICD-10-CM | POA: Diagnosis not present

## 2021-02-23 ENCOUNTER — Ambulatory Visit
Admission: RE | Admit: 2021-02-23 | Discharge: 2021-02-23 | Disposition: A | Discharge status: Home / Self Care | Payer: 59 | Source: Ambulatory Visit | Attending: Radiation Oncology | Admitting: Radiation Oncology

## 2021-02-23 DIAGNOSIS — Z510 Encounter for antineoplastic radiation therapy: Secondary | ICD-10-CM | POA: Diagnosis not present

## 2021-02-24 ENCOUNTER — Ambulatory Visit
Admission: RE | Admit: 2021-02-24 | Discharge: 2021-02-24 | Disposition: A | Discharge status: Home / Self Care | Payer: 59 | Source: Ambulatory Visit | Attending: Radiation Oncology | Admitting: Radiation Oncology

## 2021-02-24 ENCOUNTER — Other Ambulatory Visit: Payer: Self-pay

## 2021-02-24 ENCOUNTER — Ambulatory Visit: Payer: 59 | Admitting: Radiation Oncology

## 2021-02-24 DIAGNOSIS — Z510 Encounter for antineoplastic radiation therapy: Secondary | ICD-10-CM | POA: Diagnosis not present

## 2021-02-27 ENCOUNTER — Ambulatory Visit
Admission: RE | Admit: 2021-02-27 | Discharge: 2021-02-27 | Disposition: A | Discharge status: Home / Self Care | Payer: 59 | Source: Ambulatory Visit | Attending: Radiation Oncology | Admitting: Radiation Oncology

## 2021-02-27 ENCOUNTER — Other Ambulatory Visit: Payer: Self-pay

## 2021-02-27 DIAGNOSIS — Z510 Encounter for antineoplastic radiation therapy: Secondary | ICD-10-CM | POA: Diagnosis not present

## 2021-02-28 ENCOUNTER — Ambulatory Visit
Admission: RE | Admit: 2021-02-28 | Discharge: 2021-02-28 | Disposition: A | Discharge status: Home / Self Care | Payer: 59 | Source: Ambulatory Visit | Attending: Radiation Oncology | Admitting: Radiation Oncology

## 2021-02-28 ENCOUNTER — Inpatient Hospital Stay: Payer: 59 | Attending: Hematology and Oncology | Admitting: Licensed Clinical Social Worker

## 2021-02-28 DIAGNOSIS — Z170 Estrogen receptor positive status [ER+]: Secondary | ICD-10-CM | POA: Insufficient documentation

## 2021-02-28 DIAGNOSIS — C50912 Malignant neoplasm of unspecified site of left female breast: Secondary | ICD-10-CM | POA: Insufficient documentation

## 2021-02-28 DIAGNOSIS — D0512 Intraductal carcinoma in situ of left breast: Secondary | ICD-10-CM

## 2021-02-28 DIAGNOSIS — Z9013 Acquired absence of bilateral breasts and nipples: Secondary | ICD-10-CM | POA: Insufficient documentation

## 2021-02-28 DIAGNOSIS — Z923 Personal history of irradiation: Secondary | ICD-10-CM | POA: Insufficient documentation

## 2021-02-28 DIAGNOSIS — Z510 Encounter for antineoplastic radiation therapy: Secondary | ICD-10-CM | POA: Insufficient documentation

## 2021-02-28 DIAGNOSIS — C50411 Malignant neoplasm of upper-outer quadrant of right female breast: Secondary | ICD-10-CM | POA: Insufficient documentation

## 2021-02-28 DIAGNOSIS — Z79810 Long term (current) use of selective estrogen receptor modulators (SERMs): Secondary | ICD-10-CM | POA: Insufficient documentation

## 2021-02-28 NOTE — Progress Notes (Signed)
[facility]  Initial Assessment   Kaitlin [name] is a 46 y.o. year old female presenting alone for counseling.   Family/Social Information:  Housing Arrangement: patient lives with husband, 13 yo & 45 yo sons. Family members/support persons in your life? Family and Friends. 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 Has worked throughout treatment  Coping/ Adjustment to diagnosis: Patient presented for counseling due to difficulty processing emotional aspects of treatment as she nears the end of radiation after having bilateral mastectomy. Pt was tearful while discussing feelings of loss of sense of self and confidence. Feels like she "should have felt like this earlier, not now". Currently, she is pushing everything down as she is trying to be strong for everyone else, especially her 55yo son who is struggling with trauma of her diagnosis.  CSW provided empathic listening as patient processed experience so far. Normalized feelings and experiences described and began working on pt giving herself grace to be dealing with different emotions at this time.  Discussed various topics on adjustment, including different forms of intimacy, identifying aspects of self that are the same or new but positive, and voicing needs to friends.    Follow Up Plan:  Patient will write down 1 positive trait about herself that she still has post-cancer Patient will voice needs to best friend of doing "normal" activities (not just talking about cancer)  Follow-up on 03/13/2021.  Patient will also look into her schedule to potentially participate in [facility].    Christeen Douglas LCSW

## 2021-03-01 ENCOUNTER — Ambulatory Visit
Admission: RE | Admit: 2021-03-01 | Discharge: 2021-03-01 | Disposition: A | Discharge status: Home / Self Care | Payer: 59 | Source: Ambulatory Visit | Attending: Radiation Oncology | Admitting: Radiation Oncology

## 2021-03-01 ENCOUNTER — Other Ambulatory Visit: Payer: Self-pay

## 2021-03-01 DIAGNOSIS — Z510 Encounter for antineoplastic radiation therapy: Secondary | ICD-10-CM | POA: Diagnosis not present

## 2021-03-02 ENCOUNTER — Ambulatory Visit
Admission: RE | Admit: 2021-03-02 | Discharge: 2021-03-02 | Disposition: A | Discharge status: Home / Self Care | Payer: 59 | Source: Ambulatory Visit | Attending: Radiation Oncology | Admitting: Radiation Oncology

## 2021-03-02 DIAGNOSIS — Z510 Encounter for antineoplastic radiation therapy: Secondary | ICD-10-CM | POA: Diagnosis not present

## 2021-03-03 ENCOUNTER — Ambulatory Visit
Admission: RE | Admit: 2021-03-03 | Discharge: 2021-03-03 | Disposition: A | Discharge status: Home / Self Care | Payer: 59 | Source: Ambulatory Visit | Attending: Radiation Oncology | Admitting: Radiation Oncology

## 2021-03-03 ENCOUNTER — Other Ambulatory Visit: Payer: Self-pay

## 2021-03-03 ENCOUNTER — Other Ambulatory Visit: Payer: Self-pay | Admitting: Radiation Oncology

## 2021-03-03 DIAGNOSIS — Z510 Encounter for antineoplastic radiation therapy: Secondary | ICD-10-CM | POA: Diagnosis not present

## 2021-03-03 MED ORDER — TRAMADOL HCL 50 MG PO TABS: 50.0000 mg | ORAL_TABLET | Freq: Four times a day (QID) | ORAL | 0 refills | Status: DC | PRN

## 2021-03-06 ENCOUNTER — Other Ambulatory Visit: Payer: Self-pay

## 2021-03-06 ENCOUNTER — Ambulatory Visit
Admission: RE | Admit: 2021-03-06 | Discharge: 2021-03-06 | Disposition: A | Discharge status: Home / Self Care | Payer: 59 | Source: Ambulatory Visit | Attending: Radiation Oncology | Admitting: Radiation Oncology

## 2021-03-06 DIAGNOSIS — Z510 Encounter for antineoplastic radiation therapy: Secondary | ICD-10-CM | POA: Diagnosis not present

## 2021-03-07 ENCOUNTER — Ambulatory Visit: Payer: 59

## 2021-03-07 ENCOUNTER — Ambulatory Visit
Admission: RE | Admit: 2021-03-07 | Discharge: 2021-03-07 | Disposition: A | Discharge status: Home / Self Care | Payer: 59 | Source: Ambulatory Visit | Attending: Radiation Oncology | Admitting: Radiation Oncology

## 2021-03-07 DIAGNOSIS — Z510 Encounter for antineoplastic radiation therapy: Secondary | ICD-10-CM | POA: Diagnosis not present

## 2021-03-08 ENCOUNTER — Ambulatory Visit
Admission: RE | Admit: 2021-03-08 | Discharge: 2021-03-08 | Disposition: A | Discharge status: Home / Self Care | Payer: 59 | Source: Ambulatory Visit | Attending: Radiation Oncology | Admitting: Radiation Oncology

## 2021-03-08 ENCOUNTER — Ambulatory Visit: Payer: 59

## 2021-03-08 ENCOUNTER — Other Ambulatory Visit: Payer: Self-pay

## 2021-03-08 DIAGNOSIS — Z510 Encounter for antineoplastic radiation therapy: Secondary | ICD-10-CM | POA: Diagnosis not present

## 2021-03-09 ENCOUNTER — Ambulatory Visit
Admission: RE | Admit: 2021-03-09 | Discharge: 2021-03-09 | Disposition: A | Discharge status: Home / Self Care | Payer: 59 | Source: Ambulatory Visit | Attending: Radiation Oncology | Admitting: Radiation Oncology

## 2021-03-09 DIAGNOSIS — Z510 Encounter for antineoplastic radiation therapy: Secondary | ICD-10-CM | POA: Diagnosis not present

## 2021-03-10 ENCOUNTER — Telehealth: Payer: Self-pay | Admitting: *Deleted

## 2021-03-10 ENCOUNTER — Encounter: Payer: Self-pay | Admitting: *Deleted

## 2021-03-10 ENCOUNTER — Other Ambulatory Visit: Payer: Self-pay | Admitting: Radiation Oncology

## 2021-03-10 ENCOUNTER — Other Ambulatory Visit: Payer: Self-pay

## 2021-03-10 ENCOUNTER — Ambulatory Visit
Admission: RE | Admit: 2021-03-10 | Discharge: 2021-03-10 | Disposition: A | Discharge status: Home / Self Care | Payer: 59 | Source: Ambulatory Visit | Attending: Radiation Oncology | Admitting: Radiation Oncology

## 2021-03-10 DIAGNOSIS — Z510 Encounter for antineoplastic radiation therapy: Secondary | ICD-10-CM | POA: Diagnosis not present

## 2021-03-10 MED ORDER — OXYCODONE HCL 5 MG PO TABS: 2.5000 mg | ORAL_TABLET | ORAL | 0 refills | Status: DC | PRN

## 2021-03-10 NOTE — Telephone Encounter (Signed)
Spoke with the patient to let her know that we received a message from the treatment machine with her concerns of pain and skin breakdown within her treatment field.  She was asked to upload a photo to her mychart so that we can take a look at her skin as she has already left the department for the day.  She verbalized understanding.  She was informed that once we receive the photos we will be in contact with her with our recommendations.  Gloriajean Dell. Leonie Green, BSN

## 2021-03-13 ENCOUNTER — Ambulatory Visit
Admission: RE | Admit: 2021-03-13 | Discharge: 2021-03-13 | Disposition: A | Discharge status: Home / Self Care | Payer: 59 | Source: Ambulatory Visit | Attending: Radiation Oncology | Admitting: Radiation Oncology

## 2021-03-13 ENCOUNTER — Ambulatory Visit: Payer: 59

## 2021-03-13 ENCOUNTER — Inpatient Hospital Stay: Payer: 59 | Admitting: Licensed Clinical Social Worker

## 2021-03-13 ENCOUNTER — Other Ambulatory Visit: Payer: Self-pay

## 2021-03-13 ENCOUNTER — Encounter: Payer: Self-pay | Admitting: *Deleted

## 2021-03-13 DIAGNOSIS — Z170 Estrogen receptor positive status [ER+]: Secondary | ICD-10-CM

## 2021-03-13 DIAGNOSIS — C50411 Malignant neoplasm of upper-outer quadrant of right female breast: Secondary | ICD-10-CM

## 2021-03-13 DIAGNOSIS — Z510 Encounter for antineoplastic radiation therapy: Secondary | ICD-10-CM | POA: Diagnosis not present

## 2021-03-13 DIAGNOSIS — D0512 Intraductal carcinoma in situ of left breast: Secondary | ICD-10-CM

## 2021-03-13 NOTE — Progress Notes (Signed)
[facility] CSW Progress Note  Holiday representative met with patient to provide supportive counseling.  CSw and patient reviewed progress since last visit. Pt has had a couple of difficult weeks due to skin breakdown from radiation.  She has been able to acknowledge and identify traits and characteristics that she has carried through treatment, but is not always able to appreciate them yet.  She has also been able to identify and begin processing through relating stories to her son.   Discussed how processing can look different and ways to bring back "sitting in silence" and meditative prayer which have been helpful for pt in the past. Pt asked about how to disclose to people she had not told about her cancer previously without causing too many hurt feelings. Discussed that it is her right to tell or not tell others and how to present the information in a manner that respects not wanting to hurt others.  Follow-up plan: Continue to acknowledge positive traits and actions Take 2-3 minutes for meditative prayer/ processing Attend [facility]   Next appt: 03/30/2021    Christeen Douglas LCSW

## 2021-03-13 NOTE — Progress Notes (Signed)
Patient was seen following her radiation treatment with complaints of pain and skin irritation.  She is taking tramadol on occasion with little relief.  She declines using Oxycodone as she has had bad experience in the past.  She reports she started using hydrogel pads on Friday and has had some relief.  She continues the aloe, radiaplex, and neosporin creams.  She was given Silvadene cream today and we discussed instructions for use.  She was encouraged to continue the hydrogel pads, alternating with her other creams.  She will be seen by Dr. Lisbeth Renshaw tomorrow following her final radiation treatment.  She verbalized understanding of all instructions.  Gloriajean Dell. Leonie Green, BSN

## 2021-03-13 NOTE — Progress Notes (Signed)
Patient Care Team: [name], MD as PCP - General (Family Medicine) [name], RN as Oncology Nurse Navigator [name], RN as Oncology Nurse Navigator [name], MD as Consulting Physician (Hematology and Oncology)  DIAGNOSIS:    ICD-10-CM   1. Bilateral malignant neoplasm of breast in female, estrogen receptor positive, unspecified site of breast ([place])  C50.911    Z17.0    C50.912       SUMMARY OF ONCOLOGIC HISTORY: Oncology History  Bilateral breast cancer ([place])  11/01/2020 Initial Diagnosis   Right breast biopsy UOQ 9:30 position: Grade 2 IDC with DCIS ER 100%, PR [place], Ki-67 2%, HER2 negative Right breast biopsy LIQ: Grade 1 IDC with DCIS Left breast biopsy UOQ: DCIS intermediate grade    11/01/2020 Cancer Staging   Staging form: Breast, AJCC 8th Edition - Clinical: Stage IIA (cT3, cN0, cM0, G2, ER+, PR+, HER2-) - Signed by [name], MD on 11/01/2020 Stage prefix: Initial diagnosis Histologic grading system: 3 grade system    11/16/2020 Genetic Testing   Negative genetic testing on the CancerNext-Expanded+RNAinsight.  The report date is 11/16/2020  The CancerNext-Expanded gene panel offered by [facility] and includes sequencing and rearrangement analysis for the following 77 genes: AIP, ALK, APC*, ATM*, AXIN2, BAP1, BARD1, BLM, BMPR1A, BRCA1*, BRCA2*, BRIP1*, CDC73, CDH1*, CDK4, CDKN1B, CDKN2A, CHEK2*, CTNNA1, DICER1, FANCC, FH, FLCN, GALNT12, KIF1B, LZTR1, MAX, MEN1, MET, MLH1*, MSH2*, MSH3, MSH6*, MUTYH*, NBN, NF1*, NF2, NTHL1, PALB2*, PHOX2B, PMS2*, POT1, PRKAR1A, PTCH1, PTEN*, RAD51C*, RAD51D*, RB1, RECQL, RET, SDHA, SDHAF2, SDHB, SDHC, SDHD, SMAD4, SMARCA4, SMARCB1, SMARCE1, STK11, SUFU, TMEM127, TP53*, TSC1, TSC2, VHL and XRCC2 (sequencing and deletion/duplication); EGFR, EGLN1, HOXB13, KIT, MITF, PDGFRA, POLD1, and POLE (sequencing only); EPCAM and GREM1 (deletion/duplication only). DNA and RNA analyses performed for * genes.     12/06/2020 Surgery   Bilateral Mastectomies:  Left Mastectomy:HG DCIS 2.2 cm with necrosis, Margins Neg ER 90%, PR 40% Right Mastectomy: Grade 2 IDC with DCIS 3.8 cm, 1/3 LN ER 100%, PR 100%, Her 2 Neg, Ki 67: 2-15%   12/13/2020 Miscellaneous   MammaPrint: Luminal type A, low risk   01/27/2021 - 03/14/2021 Radiation Therapy   Adjuvant radiation   03/22/2021 -  Anti-estrogen oral therapy   Adjuvant tamoxifen     CHIEF COMPLIANT: Follow-up of bilateral breast cancer  INTERVAL HISTORY: Kaitlin [name] is a 46 y.o. with above-mentioned history of bilateral breast cancer having undergone bilateral mastectomies, currently on radiation therapy. She presents to the clinic today for follow-up.  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  There is significant skin peeling redness and weeping of the skin.  She is very happy that she has completed the radiation treatments.  ALLERGIES:  is allergic to ceclor [cefaclor], rizatriptan, and amoxicillin.  MEDICATIONS:  Current Outpatient Medications  Medication Sig Dispense Refill   tamoxifen (NOLVADEX) 10 MG tablet Take 1 tablet (10 mg total) by mouth daily. 90 tablet 3   zinc gluconate 50 MG tablet Take 1 tablet (50 mg total) by mouth daily.     Acetylcysteine POWD by Other route.     Ascorbic Acid 125 MG CHEW Chew 1,000 mg by mouth.     diazepam (VALIUM) 2 MG tablet Take by mouth.     Multiple Vitamin (MULTIVITAMIN) tablet Take 1 tablet by mouth daily.     OVER THE COUNTER MEDICATION Raw Vitamin D 3. 2000 mg 1 tablet daily     OVER THE COUNTER MEDICATION Elderberry Syrup. 30 ml daily.  OVER THE COUNTER MEDICATION Probiotic 1 capsule daily     No current facility-administered medications for this visit.    PHYSICAL EXAMINATION: ECOG PERFORMANCE STATUS: 1 - Symptomatic but completely ambulatory  Vitals:   03/14/21 0952  BP: 121/60  Pulse: 66  Resp: 18  Temp: 97.7 F (36.5 C)  SpO2: 100%   Filed Weights   03/14/21 0952   Weight: 151 lb 1.6 oz (68.5 kg)       LABORATORY DATA:  I have reviewed the data as listed CMP Latest Ref Rng & Units 04/06/2011  Glucose 70 - 99 mg/dL 89  BUN 6 - 23 mg/dL 11  Creatinine 0.4 - 1.2 mg/dL 0.7  Sodium 135 - 145 mEq/L 140  Potassium 3.5 - 5.1 mEq/L 4.4  Chloride 96 - 112 mEq/L 107  CO2 19 - 32 mEq/L 28  Calcium 8.4 - 10.5 mg/dL 9.3    Lab Results  Component Value Date   WBC 17.7 (H) 03/21/2012   HGB 11.4 (L) 03/21/2012   HCT 33.4 (L) 03/21/2012   MCV 91.0 03/21/2012   PLT 267 03/21/2012   NEUTROABS 2.0 04/06/2011    ASSESSMENT & PLAN:  Bilateral breast cancer ([place]) 12/06/20: Bilateral Mastectomies:  Left Mastectomy:HG DCIS 2.2 cm with necrosis, Margins Neg ER 90%, PR 40% Right Mastectomy: Grade 2 IDC with DCIS 3.8 cm, 1/3 LN ER 100%, PR 100%, Her 2 Neg, Ki 67: 2-15%   Pathology counseling: I discussed the final pathology report of the patient provided  a copy of this report. I discussed the margins as well as lymph node surgeries. We also discussed the final staging along with previously performed ER/PR and HER-2/neu testing.   Treatment Plan: 1. Mammaprint: Luminal type a low risk 2. Adjuvant XRT completed 03/14/2021 3. Adjuvant Anti-estrogen therapy with tamoxifen to start 03/22/2021   Tamoxifen counseling: We discussed the risks and benefits of tamoxifen. These include but not limited to insomnia, hot flashes, mood changes, vaginal dryness, and weight gain. Although rare, serious side effects including endometrial cancer, risk of blood clots were also discussed. We strongly believe that the benefits far outweigh the risks. Patient understands these risks and consented to starting treatment. Planned treatment duration is 10 years. Prior to surgery she took tamoxifen 5 mg and she could not tolerate it.  She had a lot of side effects to 10 mg tamoxifen.  Therefore she will start back on the 5 mg dosage and see how she does.  Tamoxifen toxicities: Severe hot  flashes, mood swings  Return to clinic in 3 months for survivorship care plan visit    No orders of the defined types were placed in this encounter.  The patient has a good understanding of the overall plan. she agrees with it. she will call with any problems that may develop before the next visit here.  Total time spent: 30 mins including face to face time and time spent for planning, charting and coordination of care  Rulon Eisenmenger, MD, MPH 03/14/2021  I, Thana Ates, am acting as scribe for Dr. [name].  I have reviewed the above documentation for accuracy and completeness, and I agree with the above.

## 2021-03-14 ENCOUNTER — Ambulatory Visit
Admission: RE | Admit: 2021-03-14 | Discharge: 2021-03-14 | Disposition: A | Discharge status: Home / Self Care | Payer: 59 | Source: Ambulatory Visit | Attending: Radiation Oncology | Admitting: Radiation Oncology

## 2021-03-14 ENCOUNTER — Encounter: Payer: Self-pay | Admitting: Radiation Oncology

## 2021-03-14 ENCOUNTER — Inpatient Hospital Stay (HOSPITAL_BASED_OUTPATIENT_CLINIC_OR_DEPARTMENT_OTHER): Payer: 59 | Admitting: Hematology and Oncology

## 2021-03-14 DIAGNOSIS — Z170 Estrogen receptor positive status [ER+]: Secondary | ICD-10-CM

## 2021-03-14 DIAGNOSIS — C50911 Malignant neoplasm of unspecified site of right female breast: Secondary | ICD-10-CM | POA: Diagnosis not present

## 2021-03-14 DIAGNOSIS — C50912 Malignant neoplasm of unspecified site of left female breast: Secondary | ICD-10-CM

## 2021-03-14 DIAGNOSIS — Z510 Encounter for antineoplastic radiation therapy: Secondary | ICD-10-CM | POA: Diagnosis not present

## 2021-03-14 MED ORDER — TAMOXIFEN CITRATE 10 MG PO TABS: 10.0000 mg | ORAL_TABLET | Freq: Every day | ORAL | 3 refills | Status: DC

## 2021-03-14 MED ORDER — ZINC GLUCONATE 50 MG PO TABS: 50.0000 mg | ORAL_TABLET | Freq: Every day | ORAL | Status: DC

## 2021-03-14 NOTE — Assessment & Plan Note (Signed)
12/06/20: Bilateral Mastectomies:  Left Mastectomy:HG DCIS 2.2 cm with necrosis, Margins Neg ER 90%, PR 40% Right Mastectomy: Grade 2 IDC with DCIS 3.8 cm, 1/3 LN ER 100%, PR 100%, Her 2 Neg, Ki 67: 2-15%  Pathology counseling: I discussed the final pathology report of the patient provided  a copy of this report. I discussed the margins as well as lymph node surgeries. We also discussed the final staging along with previously performed ER/PR and HER-2/neu testing.  Treatment Plan: 1. Mammaprint: Luminal type a low risk 2. Adjuvant XRT completed 03/14/2021 3. Adjuvant Anti-estrogen therapy with tamoxifen to start 03/22/2021  Tamoxifen counseling: We discussed the risks and benefits of tamoxifen. These include but not limited to insomnia, hot flashes, mood changes, vaginal dryness, and weight gain. Although rare, serious side effects including endometrial cancer, risk of blood clots were also discussed. We strongly believe that the benefits far outweigh the risks. Patient understands these risks and consented to starting treatment. Planned treatment duration is 10 years.  Return to clinic in 3 months for survivorship care plan visit

## 2021-03-15 ENCOUNTER — Telehealth: Payer: Self-pay | Admitting: Hematology and Oncology

## 2021-03-15 NOTE — Telephone Encounter (Signed)
Scheduled appointment per 2/21 los. Left message. Patient will be mailed an updated calendar.

## 2021-03-15 NOTE — Progress Notes (Signed)
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 She developed fatigue and anticipated skin changes in the treatment field. At the conclusion of therapy she had dry desquamation treated with silvadene and pain medication. She has hydrogel in case further breakdown occurs in the next week.   Plan: The patient will receive a call in about one month from the radiation oncology department. She will continue follow up with Dr. Lindi Adie as well.   ________________________________________________    Carola Rhine, [facility]

## 2021-03-27 ENCOUNTER — Telehealth: Payer: Self-pay | Admitting: *Deleted

## 2021-03-27 NOTE — Telephone Encounter (Signed)
Received mychart message from the patient wanting to cancel her upcoming appointment on 03/28/2021.  She stated her skin was feeling better and did not feel she needed to be seen.  I responded and asked for updated photo of her treatment field so that we have a comparison photo if she should need [place] in the future.  Will continue to follow as necessary. ? ?Gloriajean Dell. Leonie Green, BSN  ?

## 2021-03-28 ENCOUNTER — Ambulatory Visit: Payer: 59 | Admitting: Radiation Oncology

## 2021-03-30 ENCOUNTER — Other Ambulatory Visit: Payer: 59 | Admitting: Licensed Clinical Social Worker

## 2021-03-31 ENCOUNTER — Other Ambulatory Visit: Payer: Self-pay

## 2021-03-31 ENCOUNTER — Inpatient Hospital Stay: Payer: 59 | Attending: Hematology and Oncology | Admitting: Licensed Clinical Social Worker

## 2021-03-31 DIAGNOSIS — C50411 Malignant neoplasm of upper-outer quadrant of right female breast: Secondary | ICD-10-CM

## 2021-03-31 DIAGNOSIS — D0512 Intraductal carcinoma in situ of left breast: Secondary | ICD-10-CM

## 2021-03-31 DIAGNOSIS — Z170 Estrogen receptor positive status [ER+]: Secondary | ICD-10-CM

## 2021-03-31 NOTE — Progress Notes (Signed)
[facility] CSW Progress Note ? ?Clinical Social Worker met with patient to provide supportive counseling. Pt reports continued healing both physically and emotionally. She has been able to find more quiet moments to process her experience. Pt's son seems to be coping better as well which has helped at home and pt is engaged in [facility] class and is now signed up for support group. ? ?Pt does have questions about reconstruction, scans, and medicines and is anticipating various upcoming appointments but will be working with her medical team to address those questions. Briefly discussed how to sit with uncertainty if she is unable to have precise answers for certain questions.  ? ?CSW and pt also discussed friendships and other relationships and frustrations with people expecting her to be completely "better". She is trying to be more honest in responses rather than just saying she is good. ? ?Follow-up: 6 weeks (after completion of FYNN) ? ? ? ?Christeen Douglas LCSW ?

## 2021-04-03 ENCOUNTER — Other Ambulatory Visit: Payer: Self-pay

## 2021-04-03 ENCOUNTER — Ambulatory Visit: Payer: 59 | Attending: General Surgery

## 2021-04-03 VITALS — Wt 150.0 lb

## 2021-04-03 DIAGNOSIS — Z483 Aftercare following surgery for neoplasm: Secondary | ICD-10-CM | POA: Insufficient documentation

## 2021-04-03 NOTE — Therapy (Signed)
[facility] ?[place], [place], [address] ?Phone: [phone]   Fax:  [phone] ? ?Physical Therapy Treatment ? ?Patient Details  ?Name: Kaitlin [name] ?MRN: [id number] ?Date of Birth: 03-27-1975 ?Referring Provider (PT): Dr. Donne Hazel ? ? ?Encounter Date: 04/03/2021 ? ? PT End of Session - 04/03/21 0626   ? ? Visit Number 3   # unchanged due to screen only  ? PT Start Time [phone]   ? PT Stop Time 0855   ? PT Time Calculation (min) 5 min   ? Activity Tolerance Patient tolerated treatment well   ? Behavior During Therapy [facility] for tasks assessed/performed   ? ?  ?  ? ?  ? ? ?Past Medical History:  ?Diagnosis Date  ? Allergy   ? Anemia   ? Asthma   ? Cardiomyopathy ([place])   ? Chronic headaches   ? Delivery normal 11/2008  ? Family history of breast cancer   ? Family history of colon cancer   ? Family history of leukemia   ? Family history of melanoma   ? Family history of ovarian cancer   ? Heart murmur   ? Kidney stone   ? x of  ? PONV (postoperative nausea and vomiting)   ? S/P tonsillectomy   ? 2000  ? ? ?Past Surgical History:  ?Procedure Laterality Date  ? BREAST RECONSTRUCTION WITH PLACEMENT OF TISSUE EXPANDER AND ALLODERM Bilateral 12/06/2020  ? Procedure: BREAST RECONSTRUCTION WITH PLACEMENT OF TISSUE EXPANDER AND ALLODERM;  Surgeon: Irene Limbo, MD;  Location: [facility];  Service: Plastics;  Laterality: Bilateral;  ? LEEP    ? MASTECTOMY W/ SENTINEL NODE BIOPSY Right 12/06/2020  ? Procedure: RIGHT MASTECTOMY WITH RIGHT AXILLARY SENTINEL LYMPH NODE BIOPSY;  Surgeon: Rolm Bookbinder, MD;  Location: [facility];  Service: General;  Laterality: Right;  ? tonsil removal  01/22/1998  ? TOTAL MASTECTOMY Left 12/06/2020  ? Procedure: LEFT TOTAL MASTECTOMY;  Surgeon: Rolm Bookbinder, MD;  Location: [facility];  Service: General;  Laterality: Left;  ? ? ?Vitals:  ? 04/03/21 0850  ?Weight: 150 lb  (68 kg)  ? ? ? ? ? ? ? ? ? ? ? L-DEX FLOWSHEETS - 04/03/21 0800   ? ?  ? L-DEX LYMPHEDEMA SCREENING  ? Measurement Type Unilateral   ? L-DEX MEASUREMENT EXTREMITY Upper Extremity   ? POSITION  Standing   ? DOMINANT SIDE Right   ? At Risk Side Right   ? BASELINE SCORE (UNILATERAL) -0.2   ? L-DEX SCORE (UNILATERAL) 0.7   ? VALUE CHANGE (UNILAT) 0.9   ? ?  ?  ? ?  ? ? ? ? ? ? ? ? ? ? ? ? ? ? ? ? ? ? ? ? ? ? ? ? ? ? PT Long Term Goals - 01/10/21 0934   ? ?  ? PT LONG TERM GOAL #1  ? Title Pt will return to baseline AROM   ? Status Achieved   ?  ? PT LONG TERM GOAL #2  ? Title Pt will be scheduled for [facility] follow up   ? Status Achieved   ? ?  ?  ? ?  ? ? ? ? ? ? ? ? Plan - 04/03/21 0857   ? ? Clinical Impression Statement Pt returns for her 3 month L-Dex screens. Her change from baseline of 0.9 is WNLs so no further treatment is required at this  time except to cont every 3 month L-Dex screens which pt is agreeable to.   ? PT Next Visit Plan Cont every 3 month L-Dex screens for up tp 2 years from her SLNB (~12/07/2022)   ? Consulted and Agree with Plan of Care Patient   ? ?  ?  ? ?  ? ? ?Patient will benefit from skilled therapeutic intervention in order to improve the following deficits and impairments:    ? ?Visit Diagnosis: ?Aftercare following surgery for neoplasm ? ? ? ? ?Problem List ?Patient Active Problem List  ? Diagnosis Date Noted  ? 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  ? Malignant neoplasm of upper-outer quadrant of right breast in female, estrogen receptor positive ([place]) 12/12/2020  ? S/P bilateral mastectomy 12/06/2020  ? Genetic testing 11/17/2020  ? Family history of breast cancer 11/02/2020  ? Family history of ovarian cancer 11/02/2020  ? Family history of colon cancer 11/02/2020  ? Family history of melanoma 11/02/2020  ? Family history of leukemia 11/02/2020  ? Bilateral breast cancer ([place]) 11/01/2020  ? LLQ abdominal pain 04/06/2011  ? ? ?Otelia Limes, PTA ?04/03/2021,  9:03 AM ? ?[facility] ?[place], [place], [address] ?Phone: [phone]   Fax:  [phone] ? ?Name: Kaitlin [name] ?MRN: [id number] ?Date of Birth: 01-Apr-1975 ? ? ? ?

## 2021-05-09 ENCOUNTER — Other Ambulatory Visit: Payer: Self-pay

## 2021-05-09 ENCOUNTER — Inpatient Hospital Stay: Payer: 59 | Attending: Hematology and Oncology | Admitting: Licensed Clinical Social Worker

## 2021-05-09 NOTE — Progress Notes (Signed)
[facility] CSW Progress Note ? ?Clinical Social Worker met with patient to provide supportive counseling in survivorship. Patient is doing well overall. She has completed [facility] class and has been able to connect with other survivors with similar experiences. Pt is continuing to adjust to and learn to rebuild trust with her body. She is experiencing side effects from Tamoxifen which she will contact her medical provider about. Primary stressor is her son's coping for which he is receiving support. Pt did express that her confidence is returned in regard to her parenting and other aspects of her life. ? ?Based on progress, no follow-up needed at this time. Pt may call in the future as needed. ? ? ? ?Christeen Douglas LCSW ?

## 2021-05-12 ENCOUNTER — Encounter: Payer: Self-pay | Admitting: Hematology and Oncology

## 2021-05-22 ENCOUNTER — Telehealth: Payer: Self-pay | Admitting: Adult Health

## 2021-05-22 NOTE — Telephone Encounter (Signed)
Per 5/1 provider request called pt to reschedule appointment  left message, details and call back number  ?

## 2021-06-05 NOTE — Telephone Encounter (Signed)
Patient is scheduled 06/07/21 at 11:00am. ?

## 2021-06-07 ENCOUNTER — Ambulatory Visit: Payer: 59 | Admitting: Radiology

## 2021-06-07 ENCOUNTER — Encounter: Payer: Self-pay | Admitting: Radiology

## 2021-06-07 VITALS — BP 104/76 | Ht 67.5 in

## 2021-06-07 DIAGNOSIS — N898 Other specified noninflammatory disorders of vagina: Secondary | ICD-10-CM

## 2021-06-07 DIAGNOSIS — R102 Pelvic and perineal pain: Secondary | ICD-10-CM

## 2021-06-07 DIAGNOSIS — Z79810 Long term (current) use of selective estrogen receptor modulators (SERMs): Secondary | ICD-10-CM

## 2021-06-07 DIAGNOSIS — R300 Dysuria: Secondary | ICD-10-CM

## 2021-06-07 LAB — URINALYSIS, COMPLETE
Bilirubin Urine: NEGATIVE
Casts: NONE SEEN /LPF
Crystals: NONE SEEN /HPF
Glucose, UA: NEGATIVE
Hgb urine dipstick: NEGATIVE
Hyaline Cast: NONE SEEN /LPF
Leukocytes,Ua: NEGATIVE
Nitrite: NEGATIVE
Protein, ur: NEGATIVE
Specific Gravity, Urine: 1.02 (ref 1.001–1.035)
Yeast: NONE SEEN /HPF
pH: 5.5 (ref 5.0–8.0)

## 2021-06-07 NOTE — Addendum Note (Signed)
Addended by: Leatrice Jewels C on: 06/07/2021 11:50 AM ? ? Modules accepted: Orders ? ?

## 2021-06-07 NOTE — Progress Notes (Signed)
? ? ? ? ?  SUBJECTIVE: Kaitlin [name] is a 46 y.o. female Complains of pelvic pain and ovarian discomfort (increased tamoxifen to '10mg'$  05/07/21).  Patient stopped tamoxifen yesterday (for 10 days, then will follow up with oncologist). Complains of vaginal dryness, dysuria, lower back joint pain, urinary frequency and urgency. ? ?OBJECTIVE: Appears well, in no apparent distress.  Vital signs are normal. The abdomen is soft without tenderness, guarding, mass, rebound or organomegaly. No CVA tenderness or inguinal adenopathy noted. Urine dipstick shows positive for ketones.  Micro exam: negative for WBC's or RBC's.  ?Pelvic exam: VULVA: normal appearing vulva with no masses, tenderness or lesions, VAGINA: normal appearing vagina with normal color and discharge, no lesions, CERVIX: normal appearing cervix without discharge or lesions, UTERUS: uterus is normal size, shape, consistency and nontender, ADNEXA: tenderness bilateral, no masses.  ? ?Chaperone offered and declined for exam. ? ?ASSESSMENT/PLAN:  ? ?1. Pelvic pain ? ?- US Transvaginal Non-OB; Future ? ?2. Care related to current tamoxifen use ? ?3. Dysuria ?Reassured u/a normal, trace ketones only (not eating well on Tamoxifen) ?- Urinalysis,Complete w/RFL Culture ? ?4. Vaginal dryness ?Key- E suppositories twice weekly  ?

## 2021-06-07 NOTE — Telephone Encounter (Signed)
Please see staff message from [facility] that I forwarded you regarding this patient. ?

## 2021-06-12 ENCOUNTER — Encounter: Payer: 59 | Admitting: Adult Health

## 2021-06-20 NOTE — Telephone Encounter (Signed)
Do we have her scheduled anywhere yet?

## 2021-06-20 NOTE — Telephone Encounter (Signed)
Yes, i'm completely fine with her going outside. Thanks

## 2021-06-20 NOTE — Telephone Encounter (Signed)
She is scheduled 08/01/21 here in the office for u/s and visit.  My question for you is if you are willing to order u/s at [facility] and see patient another day are you okay doing the u/s in the office on a sooner day that you do not have appt to follow and let her schedule another day?  [facility] has opening for u/s as soon as 06/22/21.

## 2021-06-21 ENCOUNTER — Other Ambulatory Visit: Payer: Self-pay

## 2021-06-21 ENCOUNTER — Inpatient Hospital Stay: Payer: 59 | Attending: Hematology and Oncology | Admitting: Adult Health

## 2021-06-21 ENCOUNTER — Encounter: Payer: Self-pay | Admitting: Adult Health

## 2021-06-21 VITALS — BP 126/65 | HR 81 | Temp 97.2°F | Resp 18 | Ht 67.5 in | Wt 149.1 lb

## 2021-06-21 DIAGNOSIS — D0512 Intraductal carcinoma in situ of left breast: Secondary | ICD-10-CM | POA: Diagnosis not present

## 2021-06-21 DIAGNOSIS — Z9013 Acquired absence of bilateral breasts and nipples: Secondary | ICD-10-CM | POA: Diagnosis not present

## 2021-06-21 DIAGNOSIS — R232 Flushing: Secondary | ICD-10-CM | POA: Diagnosis not present

## 2021-06-21 DIAGNOSIS — Z8379 Family history of other diseases of the digestive system: Secondary | ICD-10-CM | POA: Insufficient documentation

## 2021-06-21 DIAGNOSIS — Z803 Family history of malignant neoplasm of breast: Secondary | ICD-10-CM | POA: Diagnosis not present

## 2021-06-21 DIAGNOSIS — Z823 Family history of stroke: Secondary | ICD-10-CM | POA: Diagnosis not present

## 2021-06-21 DIAGNOSIS — Z8371 Family history of colonic polyps: Secondary | ICD-10-CM | POA: Insufficient documentation

## 2021-06-21 DIAGNOSIS — Z808 Family history of malignant neoplasm of other organs or systems: Secondary | ICD-10-CM | POA: Insufficient documentation

## 2021-06-21 DIAGNOSIS — C50911 Malignant neoplasm of unspecified site of right female breast: Secondary | ICD-10-CM | POA: Diagnosis not present

## 2021-06-21 DIAGNOSIS — Z8042 Family history of malignant neoplasm of prostate: Secondary | ICD-10-CM | POA: Diagnosis not present

## 2021-06-21 DIAGNOSIS — R61 Generalized hyperhidrosis: Secondary | ICD-10-CM | POA: Insufficient documentation

## 2021-06-21 DIAGNOSIS — Z170 Estrogen receptor positive status [ER+]: Secondary | ICD-10-CM | POA: Insufficient documentation

## 2021-06-21 DIAGNOSIS — Z79899 Other long term (current) drug therapy: Secondary | ICD-10-CM | POA: Diagnosis not present

## 2021-06-21 DIAGNOSIS — Z806 Family history of leukemia: Secondary | ICD-10-CM | POA: Diagnosis not present

## 2021-06-21 DIAGNOSIS — Z818 Family history of other mental and behavioral disorders: Secondary | ICD-10-CM | POA: Insufficient documentation

## 2021-06-21 DIAGNOSIS — C50411 Malignant neoplasm of upper-outer quadrant of right female breast: Secondary | ICD-10-CM | POA: Insufficient documentation

## 2021-06-21 DIAGNOSIS — Z510 Encounter for antineoplastic radiation therapy: Secondary | ICD-10-CM | POA: Diagnosis present

## 2021-06-21 DIAGNOSIS — C50912 Malignant neoplasm of unspecified site of left female breast: Secondary | ICD-10-CM

## 2021-06-21 DIAGNOSIS — Z881 Allergy status to other antibiotic agents status: Secondary | ICD-10-CM | POA: Insufficient documentation

## 2021-06-21 DIAGNOSIS — Z880 Allergy status to penicillin: Secondary | ICD-10-CM | POA: Diagnosis not present

## 2021-06-21 DIAGNOSIS — Z800 Family history of malignant neoplasm of digestive organs: Secondary | ICD-10-CM | POA: Insufficient documentation

## 2021-06-21 DIAGNOSIS — Z87891 Personal history of nicotine dependence: Secondary | ICD-10-CM | POA: Insufficient documentation

## 2021-06-21 DIAGNOSIS — Z8041 Family history of malignant neoplasm of ovary: Secondary | ICD-10-CM | POA: Insufficient documentation

## 2021-06-21 NOTE — Progress Notes (Signed)
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  The report date is 11/16/2020  The CancerNext-Expanded gene panel offered by [facility] and includes sequencing and rearrangement analysis for the following 77 genes: AIP, ALK, APC*, ATM*, AXIN2, BAP1, BARD1, BLM, BMPR1A, BRCA1*, BRCA2*, BRIP1*, CDC73, CDH1*, CDK4, CDKN1B, CDKN2A, CHEK2*, CTNNA1, DICER1, FANCC, FH, FLCN, GALNT12, KIF1B, LZTR1, MAX, MEN1, MET, MLH1*, MSH2*, MSH3, MSH6*, MUTYH*, NBN, NF1*, NF2, NTHL1, PALB2*, PHOX2B, PMS2*, POT1, PRKAR1A, PTCH1, PTEN*, RAD51C*, RAD51D*, RB1, RECQL, RET, SDHA, SDHAF2, SDHB, SDHC, SDHD, SMAD4, SMARCA4, SMARCB1, SMARCE1, STK11, SUFU, TMEM127, TP53*, TSC1, TSC2, VHL and XRCC2 (sequencing and deletion/duplication); EGFR, EGLN1, HOXB13, KIT, MITF, PDGFRA, POLD1, and POLE (sequencing only); EPCAM and GREM1 (deletion/duplication only). DNA and RNA analyses performed for * genes.    12/06/2020 Surgery   Bilateral Mastectomies:  Left Mastectomy:HG DCIS 2.2 cm with necrosis, Margins Neg ER 90%, PR 40% Right Mastectomy: Grade 2 IDC with DCIS 3.8 cm, 1/3 LN ER 100%, PR 100%, Her 2 Neg, Ki 67: 2-15%   12/13/2020 Miscellaneous   MammaPrint: Luminal type A, low risk   01/27/2021 - 03/14/2021 Radiation Therapy   Site Technique Total Dose (Gy) Dose per Fx (Gy) Completed Fx Beam Energies   Chest Wall, Right: CW_R 3D 50.4/50.4 1.8 28/28 6XFFF  Chest Wall, Right: CW_R_SCLV 3D 50.4/50.4 1.8 28/28 6X, 10X  Chest Wall, Right: CW_R_Bst Electron 10/10 2 5/5 6E     03/22/2021 -  Anti-estrogen oral therapy   Adjuvant tamoxifen     INTERVAL HISTORY:  Kaitlin [name] to review her survivorship care plan detailing her treatment course for breast cancer, as well as monitoring long-term side effects of that treatment, education regarding health maintenance, screening, and overall wellness and health promotion.     Overall, Kaitlin [name] reports feeling quite well.  She is taking Tamoxifen daily and tolerates it moderately well with the exception of weight gain that she has experienced along with some hot flashes and night sweats.    REVIEW OF SYSTEMS:  Review of Systems  Constitutional:  Negative for appetite change, chills, fatigue, fever and unexpected weight change.  HENT:   Negative for hearing loss, lump/mass and trouble swallowing.   Eyes:  Negative for eye problems and icterus.  Respiratory:  Negative for chest tightness, cough and shortness of breath.   Cardiovascular:  Negative for chest pain, leg swelling and palpitations.  Gastrointestinal:  Negative for abdominal distention, abdominal pain, constipation, diarrhea, nausea and vomiting.  Endocrine: Positive for hot flashes.  Genitourinary:  Negative for difficulty urinating.   Musculoskeletal:  Negative for arthralgias.  Skin:  Negative for itching and rash.  Neurological:  Negative for dizziness, extremity weakness, headaches and numbness.  Hematological:  Negative for adenopathy. Does not bruise/bleed easily.  Psychiatric/Behavioral:  Negative for depression. The patient is not nervous/anxious.   Breast: Denies any new nodularity, masses, tenderness, nipple changes, or nipple discharge.  ONCOLOGY TREATMENT TEAM:  1. Surgeon:  Dr. Donne Hazel at [facility] 2. Medical Oncologist: Dr. Lindi Adie  3. Radiation  Oncologist: Dr. Lisbeth Renshaw 4. Plastic Surgeon: Dr. Iran Planas    PAST MEDICAL/SURGICAL HISTORY:  Past Medical History:  Diagnosis Date   Allergy    Anemia    Asthma    Cardiomyopathy ([place])    Chronic headaches    Delivery normal 11/2008   Family history of breast cancer    Family history of colon cancer    Family history of leukemia    Family history of melanoma    Family history of ovarian cancer    Heart murmur    Kidney stone    x of   PONV (postoperative nausea and vomiting)    S/P tonsillectomy    2000   Past Surgical History:  Procedure Laterality Date   BREAST RECONSTRUCTION WITH PLACEMENT OF TISSUE EXPANDER AND ALLODERM Bilateral 12/06/2020   Procedure: BREAST RECONSTRUCTION WITH PLACEMENT OF TISSUE EXPANDER AND ALLODERM;  Surgeon: Irene Limbo, MD;  Location: [facility];  Service: Plastics;  Laterality: Bilateral;   LEEP     MASTECTOMY W/ SENTINEL NODE BIOPSY Right 12/06/2020   Procedure: RIGHT MASTECTOMY WITH RIGHT AXILLARY SENTINEL LYMPH NODE BIOPSY;  Surgeon: Rolm Bookbinder, MD;  Location: [facility];  Service: General;  Laterality: Right;   tonsil removal  01/22/1998   TOTAL MASTECTOMY Left 12/06/2020   Procedure: LEFT TOTAL MASTECTOMY;  Surgeon: Rolm Bookbinder, MD;  Location: [facility];  Service: General;  Laterality: Left;     ALLERGIES:  Allergies  Allergen Reactions   Ceclor [Cefaclor] Hives and Shortness Of Breath   Rizatriptan Anaphylaxis   Amoxicillin Hives and Rash     CURRENT MEDICATIONS:  Outpatient Encounter Medications as of 06/21/2021  Medication Sig   OVER THE COUNTER MEDICATION Elderberry Syrup. 30 ml daily.   OVER THE COUNTER MEDICATION Probiotic 1 capsule daily   Acetylcysteine POWD by Other route. (Patient not taking: Reported on 06/07/2021)   Ascorbic Acid 125 MG CHEW Chew 1,000 mg by mouth. (Patient not taking: Reported on 06/07/2021)   diazepam (VALIUM) 2 MG tablet Take by mouth.  (Patient not taking: Reported on 06/07/2021)   Multiple Vitamin (MULTIVITAMIN) tablet Take 1 tablet by mouth daily. (Patient not taking: Reported on 06/07/2021)   OVER THE COUNTER MEDICATION Raw Vitamin D 3. 2000 mg 1 tablet daily (Patient not taking: Reported on 06/07/2021)   Probiotic Product (PROBIOTIC PO) Take by mouth. (Patient not taking: Reported on 06/21/2021)   tamoxifen (NOLVADEX) 10 MG tablet Take 1 tablet (10 mg total) by mouth daily. (Patient not taking: Reported on 06/07/2021)   zinc gluconate 50 MG tablet Take 1 tablet (50 mg total) by mouth daily. (Patient not taking: Reported on 06/07/2021)   No facility-administered encounter medications on file as of 06/21/2021.     ONCOLOGIC FAMILY HISTORY:  Family History  Problem Relation Age of Onset   Memory loss Mother    Ulcerative colitis Mother    Colon polyps Father    Irritable bowel syndrome Sister    Leukemia Maternal Aunt 37       AML   Melanoma Maternal Aunt 16   Breast cancer Paternal Aunt 22   Colon cancer Paternal Aunt    Leukemia Paternal Aunt 29   Prostate cancer Paternal Uncle 29   Colon polyps Paternal Uncle    Cervical cancer Maternal Grandmother    Parkinson's disease Maternal Grandmother  Alzheimer's disease Maternal Grandmother    Multiple myeloma Maternal Grandfather    Skin cancer Maternal Grandfather    Skin cancer Paternal Grandmother    Colon cancer Paternal Grandfather 82   Stroke Paternal Grandfather    Ovarian cancer Other 84       MGMs sister   Esophageal cancer Neg Hx    Rectal cancer Neg Hx    Stomach cancer Neg Hx      GENETIC COUNSELING/TESTING: See above  SOCIAL HISTORY:  Social History   Socioeconomic History   Marital status: Married    Spouse name: Not on file   Number of children: 1   Years of education: Not on file   Highest education level: Not on file  Occupational History   Occupation: Housewife  Tobacco Use   Smoking status: Former    Types: Cigarettes    Smokeless tobacco: Never  Scientific laboratory technician Use: Never used  Substance and Sexual Activity   Alcohol use: Not Currently    Comment: occasional wine   Drug use: No   Sexual activity: Not on file    Comment: Husband had Vasectomy  Other Topics Concern   Not on file  Social History Narrative   Not on file   Social Determinants of Health   Financial Resource Strain: Not on file  Food Insecurity: Not on file  Transportation Needs: Not on file  Physical Activity: Not on file  Stress: Not on file  Social Connections: Not on file  Intimate Partner Violence: Not on file     OBSERVATIONS/OBJECTIVE:  BP 126/65 (BP Location: Left Arm, Patient Position: Sitting)   Pulse 81   Temp (!) 97.2 F (36.2 C) (Temporal)   Resp 18   Ht 5' 7.5" (1.715 m)   Wt 149 lb 1.6 oz (67.6 kg)   LMP 05/23/2021 (Exact Date)   SpO2 100%   BMI 23.01 kg/m    LABORATORY DATA:  None for this visit.  DIAGNOSTIC IMAGING:  None for this visit.      ASSESSMENT AND PLAN:  Ms.. [name] is a pleasant 46 y.o. female with Stage IIA right breast invasive ductal carcinoma, and stage 0 left breast DCIS, ER+/PR+/HER2-, diagnosed in 11/2020, treated with bilateral mastectomies, adjuvant radiation therapy, and anti-estrogen therapy with tamoxifen beginning in March 2023.  She presents to the Survivorship Clinic for our initial meeting and routine follow-up post-completion of treatment for breast cancer.    1.  Bilateral breast cancer:  Kaitlin [name] is continuing to recover from definitive treatment for breast cancer. She will follow-up with her medical oncologist, Dr. Lindi Adie in 58-month with history and physical exam per surveillance protocol.  She will continue her anti-estrogen therapy with tamoxifen. Thus far, she is tolerating the tamoxifen well, with minimal side effects. She was instructed to make Dr. GLindi Adieor myself aware if she begins to experience any worsening side effects of the medication and I could see her  back in clinic to help manage those side effects, as needed.   Today, a comprehensive survivorship care plan and treatment summary was reviewed with the patient today detailing her breast cancer diagnosis, treatment course, potential late/long-term effects of treatment, appropriate follow-up care with recommendations for the future, and patient education resources.  A copy of this summary, along with a letter will be sent to the patient's primary care provider via mail/fax/In Basket message after today's visit.    2. Bone health:  .Marland Kitchen She was given education on specific  activities to promote bone health.  3. Cancer screening:  Due to Kaitlin [name]'s history and her age, she should receive screening for skin cancers, colon cancer, and gynecologic cancers.  The information and recommendations are listed on the patient's comprehensive care plan/treatment summary and were reviewed in detail with the patient.    4. Health maintenance and wellness promotion: Kaitlin [name] was encouraged to consume 5-7 servings of fruits and vegetables per day. We reviewed the "Nutrition Rainbow" handout.  She was also encouraged to engage in moderate to vigorous exercise for 30 minutes per day most days of the week. We discussed the [employer] fitness program, which is designed for cancer survivors to help them become more physically fit after cancer treatments.  She was instructed to limit her alcohol consumption and continue to abstain from tobacco use.     5. Support services/counseling: It is not uncommon for this period of the patient's cancer care trajectory to be one of many emotions and stressors.  She was given information regarding our available services and encouraged to contact me with any questions or for help enrolling in any of our support group/programs.    Follow up instructions:    -Return to cancer center in 3 months for follow-up with Dr. Lindi Adie -Follow up with surgery in 9 months -She is welcome to return  back to the Survivorship Clinic at any time; no additional follow-up needed at this time.  -Consider referral back to survivorship as a long-term survivor for continued surveillance  The patient was provided an opportunity to ask questions and all were answered. The patient agreed with the plan and demonstrated an understanding of the instructions.   Total encounter time:45 minutes*in face-to-face visit time, chart review, lab review, care coordination, order entry, and documentation of the encounter time.    Kaitlin Bihari, NP 06/21/21 9:40 AM Medical Oncology and Hematology [facility] [place], [place] [address] Tel. [phone]    Fax. [phone]  *Total Encounter Time as defined by the Centers for Medicare and Medicaid Services includes, in addition to the face-to-face time of a patient visit (documented in the note above) non-face-to-face time: obtaining and reviewing outside history, ordering and reviewing medications, tests or procedures, care coordination (communications with other health care professionals or caregivers) and documentation in the medical record.

## 2021-06-22 NOTE — Therapy (Unsigned)
OUTPATIENT PHYSICAL THERAPY ONCOLOGY EVALUATION  Patient Name: Kaitlin [name] MRN: [id number] DOB:1975-02-19, 46 y.o., female Today's Date: 06/23/2021   PT End of Session - 06/23/21 1258     Visit Number 1    Number of Visits 12    Date for PT Re-Evaluation 08/04/21    PT Start Time 0800    PT Stop Time 0850    PT Time Calculation (min) 50 min    Activity Tolerance Patient tolerated treatment well    Behavior During Therapy [facility] for tasks assessed/performed             Past Medical History:  Diagnosis Date   Allergy    Anemia    Asthma    Cardiomyopathy ([place])    Chronic headaches    Delivery normal 11/2008   Family history of breast cancer    Family history of colon cancer    Family history of leukemia    Family history of melanoma    Family history of ovarian cancer    Heart murmur    Kidney stone    x of   PONV (postoperative nausea and vomiting)    S/P tonsillectomy    2000   Past Surgical History:  Procedure Laterality Date   BREAST RECONSTRUCTION WITH PLACEMENT OF TISSUE EXPANDER AND ALLODERM Bilateral 12/06/2020   Procedure: BREAST RECONSTRUCTION WITH PLACEMENT OF TISSUE EXPANDER AND ALLODERM;  Surgeon: Irene Limbo, MD;  Location: [place];  Service: Plastics;  Laterality: Bilateral;   LEEP     MASTECTOMY W/ SENTINEL NODE BIOPSY Right 12/06/2020   Procedure: RIGHT MASTECTOMY WITH RIGHT AXILLARY SENTINEL LYMPH NODE BIOPSY;  Surgeon: Rolm Bookbinder, MD;  Location: [place];  Service: General;  Laterality: Right;   tonsil removal  01/22/1998   TOTAL MASTECTOMY Left 12/06/2020   Procedure: LEFT TOTAL MASTECTOMY;  Surgeon: Rolm Bookbinder, MD;  Location: [place];  Service: General;  Laterality: Left;   Patient Active Problem List   Diagnosis Date Noted   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   Malignant neoplasm of upper-outer quadrant of right breast in female, estrogen  receptor positive ([place]) 12/12/2020   S/P bilateral mastectomy 12/06/2020   Genetic testing 11/17/2020   Family history of breast cancer 11/02/2020   Family history of ovarian cancer 11/02/2020   Family history of colon cancer 11/02/2020   Family history of melanoma 11/02/2020   Family history of leukemia 11/02/2020   Bilateral breast cancer ([place]) 11/01/2020   LLQ abdominal pain 04/06/2011    PCP: [name], MD  REFERRING PROVIDER: Wilber Bihari, NP  REFERRING DIAG: numbness in right fingers with reaching s/p bilateral mastectomies  THERAPY DIAG:  Malignant neoplasm of right breast in female, estrogen receptor positive, unspecified site of breast ([place])  Ductal carcinoma in situ of left breast  Malignant neoplasm of left female breast, unspecified estrogen receptor status, unspecified site of breast [facility])  Aftercare following surgery for neoplasm  Stiffness of right shoulder, not elsewhere classified  ONSET DATE: 1 month ago.  Rationale for Evaluation and Treatment Rehabilitation  SUBJECTIVE  SUBJECTIVE STATEMENT: When she reaches down or reaches out or into abduction she gets numbness in her Right finger tipss.  It goes away quickly ie within  30 seconds to 2 mins. The right hand also tends to be colder than the left more recently.She carried a backpack about a month ago and noticed it that evening. She feels a little restricted with ROM. I work out with 2# wts and she notices tightness in the axillary border of her right pectorals. She is planning to have expanders exchanged this summer. My neck and back muscles are always so tight.  PERTINENT HISTORY:  Bil mastectomy with immediate reconstruction 12/06/20 with 1/3 LN positive.  Rt Grade 2 IDC with DCIS ER positive.  Lt breast DCIS  intermediate, no LN. Radiation 01/27/21-03/14/2021 to Right chest. Will have surgery this summer for expander exchange   PAIN:  Are you having pain? NoDiscomfort from expanders and tight  muscles.  PRECAUTIONS: Other: Lymphedema risk, Right UE  WEIGHT BEARING RESTRICTIONS No  FALLS:  Has patient fallen in last 6 months? No  LIVING ENVIRONMENT: Lives with: husband and 2 children(9 and 66 y.o. sons) Lives in: House/apartment Has following equipment at home: None  OCCUPATION: Engineer, building services students with [employer]  LEISURE: runs around with her children who play lacrosse/fencing  HAND DOMINANCE : right   PRIOR LEVEL OF FUNCTION: Independent  PATIENT GOALS Get rid of numbness, stretch tight muscles   OBJECTIVE  COGNITION:  Overall cognitive status: Within functional limits for tasks assessed   PALPATION: Very tight bilateral UT/levator, pectorals Right greater than left. Pressure on right levator reproduced right finger tingling. Tender right axillary border of pecs  OBSERVATIONS / OTHER ASSESSMENTS: scapular protraction bilaterally, unable to fully straighten right elbow in supine with arms at approx 75 degrees abd.  SENSATION:  Light touch: Deficits    POSTURE: forward head, rounded shoulders  UPPER EXTREMITY AROM/PROM:  A/PROM RIGHT   eval   Shoulder extension 57  Shoulder flexion 140  Shoulder abduction 145 numbness around 90 degrees  Shoulder internal rotation 70  Shoulder external rotation 95    (Blank rows = not tested)  A/PROM LEFT   eval  Shoulder extension 61  Shoulder flexion 149  Shoulder abduction 173  Shoulder internal rotation 74  Shoulder external rotation 95    (Blank rows = not tested)   CERVICAL AROM: All within normal limits:   UPPER EXTREMITY STRENGTH: WNL   LYMPHEDEMA ASSESSMENTS:   SURGERY TYPE/DATE: Bilateral Mastectomies with immediate expanders 12/06/2020 with 1/3 LN's on  Right  NUMBER OF LYMPH NODES REMOVED: 1/3 Right  CHEMOTHERAPY: No  RADIATION:Yes 01/27/21-03/14/2021  HORMONE TREATMENT: yes  INFECTIONS: no  LYMPHEDEMA ASSESSMENTS:   LANDMARK RIGHT  eval  10 cm proximal to olecranon process 29  Olecranon process 22.8  10 cm proximal to ulnar styloid process 20.8  Just proximal to ulnar styloid process 14.7  Across hand at thumb web space 18.8  At base of 2nd digit 5.45  (Blank rows = not tested)  LANDMARK LEFT  eval  10 cm proximal to olecranon process 28.4  Olecranon process 23  10 cm proximal to ulnar styloid process 19.5  Just proximal to ulnar styloid process 14.5  Across hand at thumb web space 18.1  At base of 2nd digit 5.3  (Blank rows = not tested)         QUICK DASH SURVEY: 11.36%   TODAY'S TREATMENT  Pt was educated in chest and trunk  stretches to start for HEP. See below. Discussed POC, and treatment techniques  PATIENT EDUCATION:  Education details: Access Code: [email or web address] URL: [email or web address] Date: 06/23/2021 Prepared by: Cheral Almas  Exercises - Supine Lower Trunk Rotation  - 2 x daily - 7 x weekly - 1 sets - 3-5 reps - 10-20 hold - Doorway Pec Stretch at 90 Degrees Abduction  - 2 x daily - 7 x weekly - 3 reps - 10-20 hold - Standing Shoulder and Trunk Flexion at Table  - 2 x daily - 7 x weekly - 1 sets - 3 reps - 10-20 hold - Standing Pec Stretch at Wall  - 1 x daily - 7 x weekly - 1 sets - 10 reps Person educated: Patient Education method: Explanation, Demonstration, and Handouts Education comprehension: verbalized understanding and returned demonstration   HOME EXERCISE PROGRAM: Lower trunk rotation with arms outstretched and goal post position, doorway pectoral stretch, Standing lat stretch at counter and single arm NTS with wrist extension  ASSESSMENT:  CLINICAL IMPRESSION: Patient is a 46 y.o. female who was seen today for physical therapy evaluation and treatment . She is s/p  bilateral Mastectomies for  bilateral Breast Cancer with right SLNB and 1/3 LN"s on 12/06/2020.She has had radiation to the right chest which ended in Feb of 2023. She is complaining of new onset of intermittent fingertip tingling with reaching down and out after carrying a backpack about a month ago. Symptoms go away relatively quickly afterwards. Her right hand feels colder than the left. She reproduces tingling at approximately 90 degrees of abd in standing, and in supine with arms outstretched she is unable to fully straighten her right elbow. Pressure at right levator also reproduces tingling. She is very tight throughout bilateral upper quarter, particularly UT/levator/pecs. Pt will benefit from skilled PT to address deficits and return to PLOF.   OBJECTIVE IMPAIRMENTS decreased ROM, increased fascial restrictions, impaired flexibility, postural dysfunction, and numbness/tingling .   ACTIVITY LIMITATIONS lifting, reach over head, and down, reaching to the side  PARTICIPATION LIMITATIONS:  activities requiring reaching down, in front or to the side  PERSONAL FACTORS 1-2 comorbidities: bilateral breast cancer with right sided radiation  are also affecting patient's functional outcome.   REHAB POTENTIAL: Excellent  CLINICAL DECISION MAKING: Stable/uncomplicated  EVALUATION COMPLEXITY: Low  GOALS: Goals reviewed with patient? Yes  SHORT TERM GOALS = LTGS: Target date: 08/04/2021    Pt will be independent with HEP for shoulder/trunk ROM Baseline: Goal status: INITIAL  2.  Pt will report right finger tingling decreased by 50% or greater Baseline:  Goal status: INITIAL  the right finger by 50% or greater Baseline:  Goal status: INITIAL  2.  Pt will restore right shoulder ROM to full without complaints of tingling Baseline:  Goal status: INITIAL  3.  Pt will be able to lie with arms outstretched to 90 deg abd with right elbow straight Baseline: very flexed Goal status:  INITIAL    PLAN: PT FREQUENCY: 1-2x/week  PT DURATION: 6 weeks  PLANNED INTERVENTIONS: Therapeutic exercises, Therapeutic activity, Neuromuscular re-education, Patient/Family education, Joint mobilization, Orthotic/Fit training, Manual lymph drainage, and Manual therapy, dry needling  PLAN FOR NEXT SESSION: How is tingling? Check for cord/NTS right elbow,Review HEP prn, initiate STM to pecs/traps/levator/lats etc, pec minor stretches, Postural education, progress to strength, SOZO around 6/13   Elsie Ra Georgianne Gritz, PT 06/23/2021, 1:03 PM

## 2021-06-23 ENCOUNTER — Ambulatory Visit: Payer: 59 | Attending: Adult Health

## 2021-06-23 DIAGNOSIS — Z483 Aftercare following surgery for neoplasm: Secondary | ICD-10-CM | POA: Insufficient documentation

## 2021-06-23 DIAGNOSIS — D0512 Intraductal carcinoma in situ of left breast: Secondary | ICD-10-CM | POA: Insufficient documentation

## 2021-06-23 DIAGNOSIS — M25611 Stiffness of right shoulder, not elsewhere classified: Secondary | ICD-10-CM | POA: Diagnosis present

## 2021-06-23 DIAGNOSIS — C50911 Malignant neoplasm of unspecified site of right female breast: Secondary | ICD-10-CM | POA: Insufficient documentation

## 2021-06-23 DIAGNOSIS — C50912 Malignant neoplasm of unspecified site of left female breast: Secondary | ICD-10-CM | POA: Insufficient documentation

## 2021-06-23 DIAGNOSIS — Z170 Estrogen receptor positive status [ER+]: Secondary | ICD-10-CM | POA: Insufficient documentation

## 2021-06-29 ENCOUNTER — Ambulatory Visit (INDEPENDENT_AMBULATORY_CARE_PROVIDER_SITE_OTHER): Payer: Commercial Managed Care - PPO | Admitting: Radiology

## 2021-06-29 ENCOUNTER — Ambulatory Visit (INDEPENDENT_AMBULATORY_CARE_PROVIDER_SITE_OTHER): Payer: Commercial Managed Care - PPO

## 2021-06-29 VITALS — BP 116/78

## 2021-06-29 DIAGNOSIS — Z79810 Long term (current) use of selective estrogen receptor modulators (SERMs): Secondary | ICD-10-CM

## 2021-06-29 DIAGNOSIS — R102 Pelvic and perineal pain: Secondary | ICD-10-CM

## 2021-06-29 NOTE — Progress Notes (Signed)
      Kaitlin [name] is 46 y.o. female presenting with complaint of pelvic pain since starting tamoxifen '5mg'$ , has been unable to increase dose yet due to severe 'ovarian' pain. Double mastectomy 2022, radiation, tissue spacers in now.    Objective:   VAG U/S:  Anteverted uterus, normal size and shape No myometrial masses  Symmetrical endometrium 45m No obvious masses, thickening or abnormal blood flow seen  Both ovaries small with normal follicle patter and normal perfusion  Right ovary single [id number] follice Left ovary single [id number] follicle   No adnexal masses No free fluid  Assessment/Plan:  1. Care related to current tamoxifen use  2. Pelvic pain Discussed results of u/s and reassured normal Will continue to monitor. Has the option of trying Lupron if she cannot tolerate increased dose of Tamoxifen

## 2021-06-30 ENCOUNTER — Ambulatory Visit (HOSPITAL_COMMUNITY)
Admission: RE | Admit: 2021-06-30 | Discharge: 2021-06-30 | Disposition: A | Discharge status: Home / Self Care | Payer: 59 | Source: Ambulatory Visit | Attending: Adult Health | Admitting: Adult Health

## 2021-06-30 DIAGNOSIS — C50912 Malignant neoplasm of unspecified site of left female breast: Secondary | ICD-10-CM | POA: Diagnosis present

## 2021-06-30 DIAGNOSIS — C50911 Malignant neoplasm of unspecified site of right female breast: Secondary | ICD-10-CM | POA: Diagnosis present

## 2021-06-30 DIAGNOSIS — D0512 Intraductal carcinoma in situ of left breast: Secondary | ICD-10-CM | POA: Diagnosis present

## 2021-06-30 DIAGNOSIS — Z170 Estrogen receptor positive status [ER+]: Secondary | ICD-10-CM | POA: Diagnosis present

## 2021-06-30 MED ORDER — IOHEXOL 300 MG/ML  SOLN
100.0000 mL | Freq: Once | INTRAMUSCULAR | Status: AC | PRN
  Administered 2021-06-30: 75 mL via INTRAVENOUS

## 2021-06-30 MED ORDER — SODIUM CHLORIDE (PF) 0.9 % IJ SOLN
INTRAMUSCULAR | Status: AC
  Filled 2021-06-30: qty 50

## 2021-07-03 ENCOUNTER — Encounter: Payer: Self-pay | Admitting: Physical Therapy

## 2021-07-03 ENCOUNTER — Ambulatory Visit: Payer: 59 | Admitting: Physical Therapy

## 2021-07-03 DIAGNOSIS — M25611 Stiffness of right shoulder, not elsewhere classified: Secondary | ICD-10-CM

## 2021-07-03 DIAGNOSIS — C50912 Malignant neoplasm of unspecified site of left female breast: Secondary | ICD-10-CM

## 2021-07-03 DIAGNOSIS — C50911 Malignant neoplasm of unspecified site of right female breast: Secondary | ICD-10-CM

## 2021-07-03 DIAGNOSIS — D0512 Intraductal carcinoma in situ of left breast: Secondary | ICD-10-CM

## 2021-07-03 DIAGNOSIS — Z483 Aftercare following surgery for neoplasm: Secondary | ICD-10-CM

## 2021-07-03 NOTE — Therapy (Signed)
OUTPATIENT PHYSICAL THERAPY TREATMENT NOTE   Patient Name: Kaitlin [name] MRN: [id number] DOB:[birth year], 46 y.o., female Today's Date: 07/03/2021  PCP: [name], MD REFERRING PROVIDER: : Wilber Bihari, NP  END OF SESSION:   PT End of Session - 07/03/21 1226     Visit Number 2    Number of Visits 12    Date for PT Re-Evaluation 08/04/21    PT Start Time 1110    PT Stop Time 1200    PT Time Calculation (min) 50 min    Activity Tolerance Patient tolerated treatment well    Behavior During Therapy [facility] for tasks assessed/performed             Past Medical History:  Diagnosis Date   Allergy    Anemia    Asthma    Cardiomyopathy ([place])    Chronic headaches    Delivery normal 11/2008   Family history of breast cancer    Family history of colon cancer    Family history of leukemia    Family history of melanoma    Family history of ovarian cancer    Heart murmur    Kidney stone    x of   PONV (postoperative nausea and vomiting)    S/P tonsillectomy    2000   Past Surgical History:  Procedure Laterality Date   BREAST RECONSTRUCTION WITH PLACEMENT OF TISSUE EXPANDER AND ALLODERM Bilateral 12/06/2020   Procedure: BREAST RECONSTRUCTION WITH PLACEMENT OF TISSUE EXPANDER AND ALLODERM;  Surgeon: Irene Limbo, MD;  Location: [place];  Service: Plastics;  Laterality: Bilateral;   LEEP     MASTECTOMY W/ SENTINEL NODE BIOPSY Right 12/06/2020   Procedure: RIGHT MASTECTOMY WITH RIGHT AXILLARY SENTINEL LYMPH NODE BIOPSY;  Surgeon: Rolm Bookbinder, MD;  Location: [place];  Service: General;  Laterality: Right;   tonsil removal  01/22/1998   TOTAL MASTECTOMY Left 12/06/2020   Procedure: LEFT TOTAL MASTECTOMY;  Surgeon: Rolm Bookbinder, MD;  Location: [place];  Service: General;  Laterality: Left;   Patient Active Problem List   Diagnosis Date Noted   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    Malignant neoplasm of upper-outer quadrant of right breast in female, estrogen receptor positive ([place]) 12/12/2020   S/P bilateral mastectomy 12/06/2020   Genetic testing 11/17/2020   Family history of breast cancer 11/02/2020   Family history of ovarian cancer 11/02/2020   Family history of colon cancer 11/02/2020   Family history of melanoma 11/02/2020   Family history of leukemia 11/02/2020   Bilateral breast cancer ([place]) 11/01/2020   LLQ abdominal pain 04/06/2011    REFERRING DIAG: right and left breast cancer   THERAPY DIAG:  Malignant neoplasm of right breast in female, estrogen receptor positive, unspecified site of breast ([place])  Ductal carcinoma in situ of left breast  Malignant neoplasm of left female breast, unspecified estrogen receptor status, unspecified site of breast [facility])  Aftercare following surgery for neoplasm  Stiffness of right shoulder, not elsewhere classified  Rationale for Evaluation and Treatment Rehabilitation  PERTINENT HISTORY: Bil mastectomy with immediate reconstruction 12/06/20 with 1/3 LN positive.  Rt Grade 2 IDC with DCIS ER positive.  Lt breast DCIS intermediate, no LN. Radiation 01/27/21-03/14/2021 to Right chest. Will have surgery this summer for expander exchange    PRECAUTIONS: at risk for lymphedema   SUBJECTIVE: Pt says she has been doing the stretches she learned last session and they have helped, especially the doorway  stretch   PAIN:  Are you having pain? No just tightness occasiona numbness in hand    OBJECTIVE: (objective measures completed at initial evaluation unless otherwise dated)  OBJECTIVE   COGNITION:            Overall cognitive status: Within functional limits for tasks assessed    PALPATION: Very tight bilateral UT/levator, pectorals Right greater than left. Pressure on right levator reproduced right finger tingling. Tender right axillary border of pecs   OBSERVATIONS / OTHER ASSESSMENTS: scapular protraction  bilaterally, unable to fully straighten right elbow in supine with arms at approx 75 degrees abd.   SENSATION:            Light touch: Deficits              POSTURE: forward head, rounded shoulders   UPPER EXTREMITY AROM/PROM:   A/PROM RIGHT   eval    Shoulder extension 57  Shoulder flexion 140  Shoulder abduction 145 numbness around 90 degrees  Shoulder internal rotation 70  Shoulder external rotation 95                          (Blank rows = not tested)   A/PROM LEFT   eval  Shoulder extension 61  Shoulder flexion 149  Shoulder abduction 173  Shoulder internal rotation 74  Shoulder external rotation 95                          (Blank rows = not tested)     CERVICAL AROM: All within normal limits:    UPPER EXTREMITY STRENGTH: WNL     LYMPHEDEMA ASSESSMENTS:    SURGERY TYPE/DATE: Bilateral Mastectomies with immediate expanders 12/06/2020 with 1/3 LN's on Right   NUMBER OF LYMPH NODES REMOVED: 1/3 Right   CHEMOTHERAPY: No   RADIATION:Yes 01/27/21-03/14/2021   HORMONE TREATMENT: yes   INFECTIONS: no   LYMPHEDEMA ASSESSMENTS:    LANDMARK RIGHT  eval  10 cm proximal to olecranon process 29  Olecranon process 22.8  10 cm proximal to ulnar styloid process 20.8  Just proximal to ulnar styloid process 14.7  Across hand at thumb web space 18.8  At base of 2nd digit 5.45  (Blank rows = not tested)   LANDMARK LEFT  eval  10 cm proximal to olecranon process 28.4  Olecranon process 23  10 cm proximal to ulnar styloid process 19.5  Just proximal to ulnar styloid process 14.5  Across hand at thumb web space 18.1  At base of 2nd digit 5.3  (Blank rows = not tested)                 QUICK DASH SURVEY: 11.36%     TODAY'S TREATMENT   AROM to neck, scapulae, shoulders and upper thoracic region with attention to neural flossing activites. Soft tissue work to anterior lateral and posterior shoulder and scapular area with cocoa butter in supine and sidelying  positions. Ended session with supine streching over rolled towel along sping and educated to do this at home for polonged stretch to anterior shoulder. Standing scapulare restraction with green theraband at dooknob to activate lats for more normal muscle contraction pattern.  PATIENT EDUCATION:  HOME EXERCISE PROGRAM: continue stretches as before. Add cervical rotation for neural flossing. Add standing scap retraction  ASSESSMENT:   CLINICAL IMPRESSION:  Pt continues with tightness in right upper trap,anterior, posterior and scapular area with some tender points.  It took prolonged time of slow stroking and deeper soft tissue work with stretching to achieve some release.  Pt also benefitted from work in the sidelying position.    OBJECTIVE IMPAIRMENTS decreased ROM, increased fascial restrictions, impaired flexibility, postural dysfunction, and numbness/tingling .    ACTIVITY LIMITATIONS lifting, reach over head, and down, reaching to the side   PARTICIPATION LIMITATIONS:  activities requiring reaching down, in front or to the side   PERSONAL FACTORS 1-2 comorbidities: bilateral breast cancer with right sided radiation  are also affecting patient's functional outcome.    REHAB POTENTIAL: Excellent   CLINICAL DECISION MAKING: Stable/uncomplicated   EVALUATION COMPLEXITY: Low   GOALS: Goals reviewed with patient? Yes   SHORT TERM GOALS = LTGS: Target date: 08/04/2021     Pt will be independent with HEP for shoulder/trunk ROM Baseline: Goal status: INITIAL   2.  Pt will report right finger tingling decreased by 50% or greater Baseline:  Goal status: INITIAL  the right finger by 50% or greater Baseline:  Goal status: INITIAL   2.  Pt will restore right shoulder ROM to full without complaints of tingling Baseline:  Goal status: INITIAL   3.  Pt will be able to lie with arms outstretched to 90 deg abd with right elbow straight Baseline: very flexed Goal status: INITIAL        PLAN: PT FREQUENCY: 1-2x/week   PT DURATION: 6 weeks   PLANNED INTERVENTIONS: Therapeutic exercises, Therapeutic activity, Neuromuscular re-education, Patient/Family education, Joint mobilization, Orthotic/Fit training, Manual lymph drainage, and Manual therapy, dry needling   PLAN FOR NEXT SESSION: Do SOZO How is tingling? Check for cord/NTS right elbow,Review HEP prn, continue STM to pecs/traps/levator/lats etc, pec minor stretches, Postural education, progress to strength,  3      Jasmen Emrich K. Owens Shark PT   Norwood Levo, PT 07/03/2021, 12:40 PM

## 2021-07-05 ENCOUNTER — Other Ambulatory Visit: Payer: Self-pay | Admitting: Adult Health

## 2021-07-05 ENCOUNTER — Ambulatory Visit: Payer: 59

## 2021-07-05 DIAGNOSIS — C50912 Malignant neoplasm of unspecified site of left female breast: Secondary | ICD-10-CM

## 2021-07-05 DIAGNOSIS — R911 Solitary pulmonary nodule: Secondary | ICD-10-CM

## 2021-07-05 DIAGNOSIS — C50911 Malignant neoplasm of unspecified site of right female breast: Secondary | ICD-10-CM | POA: Diagnosis not present

## 2021-07-05 DIAGNOSIS — M25611 Stiffness of right shoulder, not elsewhere classified: Secondary | ICD-10-CM

## 2021-07-05 DIAGNOSIS — D0512 Intraductal carcinoma in situ of left breast: Secondary | ICD-10-CM

## 2021-07-05 DIAGNOSIS — Z483 Aftercare following surgery for neoplasm: Secondary | ICD-10-CM

## 2021-07-05 NOTE — Therapy (Signed)
OUTPATIENT PHYSICAL THERAPY TREATMENT NOTE   Patient Name: Kaitlin [name] MRN: [id number] DOB:[birth year], 46 y.o., female Today's Date: 07/05/2021  PCP: [name], MD REFERRING PROVIDER: : Wilber Bihari, NP  END OF SESSION:   PT End of Session - 07/05/21 1203     Visit Number 3    Number of Visits 12    Date for PT Re-Evaluation 08/04/21    PT Start Time 1111    PT Stop Time 1200    PT Time Calculation (min) 49 min    Activity Tolerance Patient tolerated treatment well    Behavior During Therapy [facility] for tasks assessed/performed              Past Medical History:  Diagnosis Date   Allergy    Anemia    Asthma    Cardiomyopathy ([place])    Chronic headaches    Delivery normal 11/2008   Family history of breast cancer    Family history of colon cancer    Family history of leukemia    Family history of melanoma    Family history of ovarian cancer    Heart murmur    Kidney stone    x of   PONV (postoperative nausea and vomiting)    S/P tonsillectomy    2000   Past Surgical History:  Procedure Laterality Date   BREAST RECONSTRUCTION WITH PLACEMENT OF TISSUE EXPANDER AND ALLODERM Bilateral 12/06/2020   Procedure: BREAST RECONSTRUCTION WITH PLACEMENT OF TISSUE EXPANDER AND ALLODERM;  Surgeon: Irene Limbo, MD;  Location: [place];  Service: Plastics;  Laterality: Bilateral;   LEEP     MASTECTOMY W/ SENTINEL NODE BIOPSY Right 12/06/2020   Procedure: RIGHT MASTECTOMY WITH RIGHT AXILLARY SENTINEL LYMPH NODE BIOPSY;  Surgeon: Rolm Bookbinder, MD;  Location: [place];  Service: General;  Laterality: Right;   tonsil removal  01/22/1998   TOTAL MASTECTOMY Left 12/06/2020   Procedure: LEFT TOTAL MASTECTOMY;  Surgeon: Rolm Bookbinder, MD;  Location: [place];  Service: General;  Laterality: Left;   Patient Active Problem List   Diagnosis Date Noted   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    Malignant neoplasm of upper-outer quadrant of right breast in female, estrogen receptor positive ([place]) 12/12/2020   S/P bilateral mastectomy 12/06/2020   Genetic testing 11/17/2020   Family history of breast cancer 11/02/2020   Family history of ovarian cancer 11/02/2020   Family history of colon cancer 11/02/2020   Family history of melanoma 11/02/2020   Family history of leukemia 11/02/2020   Bilateral breast cancer ([place]) 11/01/2020   LLQ abdominal pain 04/06/2011    REFERRING DIAG: right and left breast cancer   THERAPY DIAG:  Malignant neoplasm of right breast in female, estrogen receptor positive, unspecified site of breast ([place])  Ductal carcinoma in situ of left breast  Malignant neoplasm of left female breast, unspecified estrogen receptor status, unspecified site of breast [facility])  Aftercare following surgery for neoplasm  Stiffness of right shoulder, not elsewhere classified  Rationale for Evaluation and Treatment Rehabilitation  PERTINENT HISTORY: Bil mastectomy with immediate reconstruction 12/06/20 with 1/3 LN positive.  Rt Grade 2 IDC with DCIS ER positive.  Lt breast DCIS intermediate, no LN. Radiation 01/27/21-03/14/2021 to Right chest. Will have surgery this summer for expander exchange    PRECAUTIONS: at risk for lymphedema   SUBJECTIVE: I am very sore at the lateral trunk from the massage last time.  The tingling is intermittent.  I saw the Plastic surgeon and we are looking at Aug 25 for expander exchange   PAIN:  Are you having pain? No discomfort at lateral trunk.    OBJECTIVE: (objective measures completed at initial evaluation unless otherwise dated)  OBJECTIVE   COGNITION:            Overall cognitive status: Within functional limits for tasks assessed    PALPATION: Very tight bilateral UT/levator, pectorals Right greater than left. Pressure on right levator reproduced right finger tingling. Tender right axillary border of pecs   OBSERVATIONS /  OTHER ASSESSMENTS: scapular protraction bilaterally, unable to fully straighten right elbow in supine with arms at approx 75 degrees abd.   SENSATION:            Light touch: Deficits              POSTURE: forward head, rounded shoulders   UPPER EXTREMITY AROM/PROM:   A/PROM RIGHT   eval    Shoulder extension 57  Shoulder flexion 140  Shoulder abduction 145 numbness around 90 degrees  Shoulder internal rotation 70  Shoulder external rotation 95                          (Blank rows = not tested)   A/PROM LEFT   eval  Shoulder extension 61  Shoulder flexion 149  Shoulder abduction 173  Shoulder internal rotation 74  Shoulder external rotation 95                          (Blank rows = not tested)     CERVICAL AROM: All within normal limits:    UPPER EXTREMITY STRENGTH: WNL     LYMPHEDEMA ASSESSMENTS:    SURGERY TYPE/DATE: Bilateral Mastectomies with immediate expanders 12/06/2020 with 1/3 LN's on Right   NUMBER OF LYMPH NODES REMOVED: 1/3 Right   CHEMOTHERAPY: No   RADIATION:Yes 01/27/21-03/14/2021   HORMONE TREATMENT: yes   INFECTIONS: no   LYMPHEDEMA ASSESSMENTS:    LANDMARK RIGHT  eval  10 cm proximal to olecranon process 29  Olecranon process 22.8  10 cm proximal to ulnar styloid process 20.8  Just proximal to ulnar styloid process 14.7  Across hand at thumb web space 18.8  At base of 2nd digit 5.45  (Blank rows = not tested)   LANDMARK LEFT  eval  10 cm proximal to olecranon process 28.4  Olecranon process 23  10 cm proximal to ulnar styloid process 19.5  Just proximal to ulnar styloid process 14.5  Across hand at thumb web space 18.1  At base of 2nd digit 5.3  (Blank rows = not tested)                 QUICK DASH SURVEY: 11.36%     TODAY'S TREATMENT  07/05/2021  Soft tissue work to anterior lateral and posterior shoulder and scapular area with cocoa butter in supine and sidelying positions. Ended session with supine streching over  rolled towel along spine with pt performing shoulder flexion, and abduction with ER x 5 ea and educated to do this at home Standing Bilateral scapular retraction , shoulder extension with green theraband and single arm ER x10 at dooknob for scapular/lat strengthening. Reviewed standing corner pec stretch and single arm pec stretch at doorway. Updated HEP with pictures   07/03/2021 AROM to neck, scapulae, shoulders and upper thoracic region with attention to neural flossing activites. Soft tissue work  to anterior lateral and posterior shoulder and scapular area with cocoa butter in supine and sidelying positions. Ended session with supine streching over rolled towel along sping and educated to do this at home for polonged stretch to anterior shoulder. Standing scapulare restraction with green theraband at dooknob to activate lats for more normal muscle contraction pattern.  PATIENT EDUCATION:  07/05/2021 Scapular retraction, shoulder extension, ER with green band x 10 Person educated :pt Method:explanation, demonstration handout Pt return demonstrated  HOME EXERCISE PROGRAM: continue stretches as before. Add cervical rotation for neural flossing. Add standing scap retraction , ext, ER with green band x 10 ASSESSMENT:   CLINICAL IMPRESSION:  Pt continues with tightness especially in right pectorals very noticeable with lying over rolled towel performing exercises. Continued tenderness in scapular area, lats as well. She did very well with green theraband and HEP was updated. Intermittent tingling continues  OBJECTIVE IMPAIRMENTS decreased ROM, increased fascial restrictions, impaired flexibility, postural dysfunction, and numbness/tingling .    ACTIVITY LIMITATIONS lifting, reach over head, and down, reaching to the side   PARTICIPATION LIMITATIONS:  activities requiring reaching down, in front or to the side   PERSONAL FACTORS 1-2 comorbidities: bilateral breast cancer with right sided radiation   are also affecting patient's functional outcome.    REHAB POTENTIAL: Excellent   CLINICAL DECISION MAKING: Stable/uncomplicated   EVALUATION COMPLEXITY: Low   GOALS: Goals reviewed with patient? Yes   SHORT TERM GOALS = LTGS: Target date: 08/04/2021     Pt will be independent with HEP for shoulder/trunk ROM Baseline: Goal status: INITIAL   2.  Pt will report right finger tingling decreased by 50% or greater Baseline:  Goal status: INITIAL  the right finger by 50% or greater Baseline:  Goal status: INITIAL   2.  Pt will restore right shoulder ROM to full without complaints of tingling Baseline:  Goal status: INITIAL   3.  Pt will be able to lie with arms outstretched to 90 deg abd with right elbow straight Baseline: very flexed Goal status: INITIAL       PLAN: PT FREQUENCY: 1-2x/week   PT DURATION: 6 weeks   PLANNED INTERVENTIONS: Therapeutic exercises, Therapeutic activity, Neuromuscular re-education, Patient/Family education, Joint mobilization, Orthotic/Fit training, Manual lymph drainage, and Manual therapy, dry needling   PLAN FOR NEXT SESSION: Do SOZO How is tingling? Check for cord/NTS right elbow,Review HEP prn, continue STM to pecs/traps/levator/lats etc, pec minor stretches, Postural education, progress to strength,  3      Teresa K. Owens Shark, PT   Claris Pong, PT 07/05/2021, 12:04 PM

## 2021-07-05 NOTE — Progress Notes (Signed)
CT scan completed on 07/02/2021 shows lung nodule and recommended CT non contrast chest in 3 months.  I reviewed this with Kaitlin [name] and she understands and would like to proceed with this imaging.    Wilber Bihari, NP 07/05/21 8:06 AM Medical Oncology and Hematology [facility] [place], [place] [address] Tel. [phone]    Fax. [phone]

## 2021-07-12 ENCOUNTER — Encounter: Payer: 59 | Admitting: Rehabilitation

## 2021-07-13 ENCOUNTER — Ambulatory Visit: Payer: 59

## 2021-07-13 DIAGNOSIS — C50911 Malignant neoplasm of unspecified site of right female breast: Secondary | ICD-10-CM | POA: Diagnosis not present

## 2021-07-13 DIAGNOSIS — Z483 Aftercare following surgery for neoplasm: Secondary | ICD-10-CM

## 2021-07-13 DIAGNOSIS — C50912 Malignant neoplasm of unspecified site of left female breast: Secondary | ICD-10-CM

## 2021-07-13 DIAGNOSIS — D0512 Intraductal carcinoma in situ of left breast: Secondary | ICD-10-CM

## 2021-07-13 NOTE — Therapy (Addendum)
OUTPATIENT PHYSICAL THERAPY TREATMENT NOTE   Patient Name: Kaitlin [name] MRN: [id number] DOB:1975/11/14, 46 y.o., female Today's Date: 07/13/2021  PCP: [name], MD REFERRING PROVIDER: : Wilber Bihari, NP  END OF SESSION:   PT End of Session - 07/13/21 0802     Visit Number 4    Number of Visits 12    Date for PT Re-Evaluation 08/04/21    PT Start Time 0803    PT Stop Time 0856    PT Time Calculation (min) 53 min    Activity Tolerance Patient tolerated treatment well    Behavior During Therapy [facility] for tasks assessed/performed              Past Medical History:  Diagnosis Date   Allergy    Anemia    Asthma    Cardiomyopathy ([place])    Chronic headaches    Delivery normal 11/2008   Family history of breast cancer    Family history of colon cancer    Family history of leukemia    Family history of melanoma    Family history of ovarian cancer    Heart murmur    Kidney stone    x of   PONV (postoperative nausea and vomiting)    S/P tonsillectomy    2000   Past Surgical History:  Procedure Laterality Date   BREAST RECONSTRUCTION WITH PLACEMENT OF TISSUE EXPANDER AND ALLODERM Bilateral 12/06/2020   Procedure: BREAST RECONSTRUCTION WITH PLACEMENT OF TISSUE EXPANDER AND ALLODERM;  Surgeon: Irene Limbo, MD;  Location: [place];  Service: Plastics;  Laterality: Bilateral;   LEEP     MASTECTOMY W/ SENTINEL NODE BIOPSY Right 12/06/2020   Procedure: RIGHT MASTECTOMY WITH RIGHT AXILLARY SENTINEL LYMPH NODE BIOPSY;  Surgeon: Rolm Bookbinder, MD;  Location: [place];  Service: General;  Laterality: Right;   tonsil removal  01/22/1998   TOTAL MASTECTOMY Left 12/06/2020   Procedure: LEFT TOTAL MASTECTOMY;  Surgeon: Rolm Bookbinder, MD;  Location: [place];  Service: General;  Laterality: Left;   Patient Active Problem List   Diagnosis Date Noted   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    Malignant neoplasm of upper-outer quadrant of right breast in female, estrogen receptor positive ([place]) 12/12/2020   S/P bilateral mastectomy 12/06/2020   Genetic testing 11/17/2020   Family history of breast cancer 11/02/2020   Family history of ovarian cancer 11/02/2020   Family history of colon cancer 11/02/2020   Family history of melanoma 11/02/2020   Family history of leukemia 11/02/2020   Bilateral breast cancer ([place]) 11/01/2020   LLQ abdominal pain 04/06/2011    REFERRING DIAG: right and left breast cancer   THERAPY DIAG:  Malignant neoplasm of right breast in female, estrogen receptor positive, unspecified site of breast ([place])  Ductal carcinoma in situ of left breast  Malignant neoplasm of left female breast, unspecified estrogen receptor status, unspecified site of breast [facility])  Aftercare following surgery for neoplasm  Rationale for Evaluation and Treatment Rehabilitation  PERTINENT HISTORY: Bil mastectomy with immediate reconstruction 12/06/20 with 1/3 LN positive.  Rt Grade 2 IDC with DCIS ER positive.  Lt breast DCIS intermediate, no LN. Radiation 01/27/21-03/14/2021 to Right chest. Will have surgery this summer for expander exchange    PRECAUTIONS: at risk for lymphedema   SUBJECTIVE: I was fine after last visit.  IF I sleep on my shoulder funny I feel like I get tight. Have some discomfort in the tissue  expanders. Tingling is worse in the am but during the day it is always better. PAIN:  Are you having pain? No discomfort at lateral trunk.    OBJECTIVE: (objective measures completed at initial evaluation unless otherwise dated)  OBJECTIVE   COGNITION:            Overall cognitive status: Within functional limits for tasks assessed    PALPATION: Very tight bilateral UT/levator, pectorals Right greater than left. Pressure on right levator reproduced right finger tingling. Tender right axillary border of pecs   OBSERVATIONS / OTHER ASSESSMENTS: scapular  protraction bilaterally, unable to fully straighten right elbow in supine with arms at approx 75 degrees abd.   SENSATION:            Light touch: Deficits              POSTURE: forward head, rounded shoulders   UPPER EXTREMITY AROM/PROM:   A/PROM RIGHT   eval    Shoulder extension 57  Shoulder flexion 140  Shoulder abduction 145 numbness around 90 degrees  Shoulder internal rotation 70  Shoulder external rotation 95                          (Blank rows = not tested)   A/PROM LEFT   eval  Shoulder extension 61  Shoulder flexion 149  Shoulder abduction 173  Shoulder internal rotation 74  Shoulder external rotation 95                          (Blank rows = not tested)     CERVICAL AROM: All within normal limits:    UPPER EXTREMITY STRENGTH: WNL     LYMPHEDEMA ASSESSMENTS:    SURGERY TYPE/DATE: Bilateral Mastectomies with immediate expanders 12/06/2020 with 1/3 LN's on Right   NUMBER OF LYMPH NODES REMOVED: 1/3 Right   CHEMOTHERAPY: No   RADIATION:Yes 01/27/21-03/14/2021   HORMONE TREATMENT: yes   INFECTIONS: no   LYMPHEDEMA ASSESSMENTS:    LANDMARK RIGHT  eval  10 cm proximal to olecranon process 29  Olecranon process 22.8  10 cm proximal to ulnar styloid process 20.8  Just proximal to ulnar styloid process 14.7  Across hand at thumb web space 18.8  At base of 2nd digit 5.45  (Blank rows = not tested)   LANDMARK LEFT  eval  10 cm proximal to olecranon process 28.4  Olecranon process 23  10 cm proximal to ulnar styloid process 19.5  Just proximal to ulnar styloid process 14.5  Across hand at thumb web space 18.1  At base of 2nd digit 5.3  (Blank rows = not tested)                 QUICK DASH SURVEY: 11.36%     TODAY'S TREATMENT   07/13/2021 Performed SOZO screen Soft tissue work to anterior lateral and posterior shoulder and scapular area with cocoa butter in supine and sidelying positions.  PROM right shoulder Flexion, scaption, D2  flexion,  Deep breathing with PT passive bilateral pec stretches. Single arm passive right pec stretch in supine x4.Ended session with supine streching over foam roll along spine with pt performing shoulder flexion, Horizontal abduction and abduction with ER x 5 ea , passive stretch with elbows on table x 5 Standing Jobes flex, scaption,and abduction against wall x 10 Instructed UT and levator stretches but did not give pictures  07/05/2021  Soft tissue work to anterior  lateral and posterior shoulder and scapular area with cocoa butter in supine and sidelying positions. Ended session with supine streching over rolled towel along spine with pt performing shoulder flexion, and abduction with ER x 5 ea and educated to do this at home Standing Bilateral scapular retraction , shoulder extension with green theraband and single arm ER x10 at dooknob for scapular/lat strengthening. Reviewed standing corner pec stretch and single arm pec stretch at doorway. Updated HEP with pictures   07/03/2021 AROM to neck, scapulae, shoulders and upper thoracic region with attention to neural flossing activites. Soft tissue work to anterior lateral and posterior shoulder and scapular area with cocoa butter in supine and sidelying positions. Ended session with supine streching over rolled towel along sping and educated to do this at home for polonged stretch to anterior shoulder. Standing scapulare restraction with green theraband at dooknob to activate lats for more normal muscle contraction pattern.  PATIENT EDUCATION:  07/05/2021 Scapular retraction, shoulder extension, ER with green band x 10 Person educated :pt Method:explanation, demonstration handout Pt return demonstrated  HOME EXERCISE PROGRAM: continue stretches as before. Add cervical rotation for neural flossing. Add standing scap retraction , ext, ER with green band x 10 ASSESSMENT:   CLINICAL IMPRESSION:   Pt continues with some tenderness at pecs, and with  multiple tender points in scapular area and levator, but at completion of treatment felt much looser, and had no numbness with activities that previously caused numbness. Did very well with foam roll and was encouraged to do at home.  OBJECTIVE IMPAIRMENTS decreased ROM, increased fascial restrictions, impaired flexibility, postural dysfunction, and numbness/tingling .    ACTIVITY LIMITATIONS lifting, reach over head, and down, reaching to the side   PARTICIPATION LIMITATIONS:  activities requiring reaching down, in front or to the side   PERSONAL FACTORS 1-2 comorbidities: bilateral breast cancer with right sided radiation  are also affecting patient's functional outcome.    REHAB POTENTIAL: Excellent   CLINICAL DECISION MAKING: Stable/uncomplicated   EVALUATION COMPLEXITY: Low   GOALS: Goals reviewed with patient? Yes   SHORT TERM GOALS = LTGS: Target date: 08/04/2021     Pt will be independent with HEP for shoulder/trunk ROM Baseline: Goal status: INITIAL   2.  Pt will report right finger tingling decreased by 50% or greater Baseline:  Goal status: INITIAL  the right finger by 50% or greater Baseline:  Goal status: INITIAL   2.  Pt will restore right shoulder ROM to full without complaints of tingling Baseline:  Goal status: INITIAL   3.  Pt will be able to lie with arms outstretched to 90 deg abd with right elbow straight Baseline: very flexed Goal status: INITIAL       PLAN: PT FREQUENCY: 1-2x/week   PT DURATION: 6 weeks   PLANNED INTERVENTIONS: Therapeutic exercises, Therapeutic activity, Neuromuscular re-education, Patient/Family education, Joint mobilization, Orthotic/Fit training, Manual lymph drainage, and Manual therapy, dry needling   PLAN FOR NEXT SESSION: Give pics of neck stretches.How is tingling? Check for cord/NTS right elbow,Review HEP prn, continue STM to pecs/traps/levator/lats etc, pec minor stretches,foam roll Postural education, progress to  strength,        Claris Pong, PT 07/13/2021, 8:59 AM

## 2021-07-31 ENCOUNTER — Ambulatory Visit: Payer: 59

## 2021-08-01 ENCOUNTER — Other Ambulatory Visit: Payer: 59

## 2021-08-01 ENCOUNTER — Other Ambulatory Visit: Payer: 59 | Admitting: Radiology

## 2021-08-07 ENCOUNTER — Ambulatory Visit: Payer: 59 | Admitting: Rehabilitation

## 2021-08-08 ENCOUNTER — Ambulatory Visit: Payer: 59 | Attending: Adult Health | Admitting: Physical Therapy

## 2021-08-08 ENCOUNTER — Encounter: Payer: Self-pay | Admitting: Physical Therapy

## 2021-08-08 DIAGNOSIS — Z483 Aftercare following surgery for neoplasm: Secondary | ICD-10-CM | POA: Insufficient documentation

## 2021-08-08 DIAGNOSIS — R293 Abnormal posture: Secondary | ICD-10-CM | POA: Insufficient documentation

## 2021-08-08 DIAGNOSIS — M25611 Stiffness of right shoulder, not elsewhere classified: Secondary | ICD-10-CM | POA: Diagnosis present

## 2021-08-08 DIAGNOSIS — Z170 Estrogen receptor positive status [ER+]: Secondary | ICD-10-CM | POA: Diagnosis present

## 2021-08-08 DIAGNOSIS — C50911 Malignant neoplasm of unspecified site of right female breast: Secondary | ICD-10-CM | POA: Insufficient documentation

## 2021-08-08 DIAGNOSIS — D0512 Intraductal carcinoma in situ of left breast: Secondary | ICD-10-CM | POA: Insufficient documentation

## 2021-08-08 DIAGNOSIS — C50912 Malignant neoplasm of unspecified site of left female breast: Secondary | ICD-10-CM | POA: Diagnosis present

## 2021-08-08 NOTE — Therapy (Signed)
OUTPATIENT PHYSICAL THERAPY TREATMENT NOTE   Patient Name: Kaitlin [name] MRN: [id number] DOB:04-Sep-1975, 46 y.o., female Today's Date: 08/08/2021  PCP: [name], MD REFERRING PROVIDER: : Wilber Bihari, NP  END OF SESSION:   PT End of Session - 08/08/21 1051     Visit Number 5    Number of Visits 12    Date for PT Re-Evaluation 08/04/21    PT Start Time 1000    PT Stop Time 1046    PT Time Calculation (min) 46 min    Activity Tolerance Patient tolerated treatment well    Behavior During Therapy [facility] for tasks assessed/performed               Past Medical History:  Diagnosis Date   Allergy    Anemia    Asthma    Cardiomyopathy ([place])    Chronic headaches    Delivery normal 11/2008   Family history of breast cancer    Family history of colon cancer    Family history of leukemia    Family history of melanoma    Family history of ovarian cancer    Heart murmur    Kidney stone    x of   PONV (postoperative nausea and vomiting)    S/P tonsillectomy    2000   Past Surgical History:  Procedure Laterality Date   BREAST RECONSTRUCTION WITH PLACEMENT OF TISSUE EXPANDER AND ALLODERM Bilateral 12/06/2020   Procedure: BREAST RECONSTRUCTION WITH PLACEMENT OF TISSUE EXPANDER AND ALLODERM;  Surgeon: Irene Limbo, MD;  Location: [place];  Service: Plastics;  Laterality: Bilateral;   LEEP     MASTECTOMY W/ SENTINEL NODE BIOPSY Right 12/06/2020   Procedure: RIGHT MASTECTOMY WITH RIGHT AXILLARY SENTINEL LYMPH NODE BIOPSY;  Surgeon: Rolm Bookbinder, MD;  Location: [place];  Service: General;  Laterality: Right;   tonsil removal  01/22/1998   TOTAL MASTECTOMY Left 12/06/2020   Procedure: LEFT TOTAL MASTECTOMY;  Surgeon: Rolm Bookbinder, MD;  Location: [place];  Service: General;  Laterality: Left;   Patient Active Problem List   Diagnosis Date Noted   Ductal carcinoma in situ (DCIS) of left breast  01/03/2021   Malignant neoplasm of upper-outer quadrant of right breast in female, estrogen receptor positive ([place]) 12/12/2020   S/P bilateral mastectomy 12/06/2020   Genetic testing 11/17/2020   Family history of breast cancer 11/02/2020   Family history of ovarian cancer 11/02/2020   Family history of colon cancer 11/02/2020   Family history of melanoma 11/02/2020   Family history of leukemia 11/02/2020   Bilateral breast cancer ([place]) 11/01/2020   LLQ abdominal pain 04/06/2011    REFERRING DIAG: right and left breast cancer   THERAPY DIAG:  Malignant neoplasm of right breast in female, estrogen receptor positive, unspecified site of breast ([place])  Ductal carcinoma in situ of left breast  Malignant neoplasm of left female breast, unspecified estrogen receptor status, unspecified site of breast [facility])  Aftercare following surgery for neoplasm  Stiffness of right shoulder, not elsewhere classified  Abnormal posture  Bilateral malignant neoplasm of breast in female, estrogen receptor positive, unspecified site of breast ([place])  Rationale for Evaluation and Treatment Rehabilitation  PERTINENT HISTORY: Bil mastectomy with immediate reconstruction 12/06/20 with 1/3 LN positive.  Rt Grade 2 IDC with DCIS ER positive.  Lt breast DCIS intermediate, no LN. Radiation 01/27/21-03/14/2021 to Right chest. Will have surgery this summer for expander exchange    PRECAUTIONS: at risk for lymphedema  SUBJECTIVE:  Pt says she is doing very well.  She has been doing her exercises and is walking and feels she is seeing improvement. She has her implant exchange planned for August 25.    PAIN:  Are you having pain? No   OBJECTIVE: (objective measures completed at initial evaluation unless otherwise dated)    UPPER EXTREMITY AROM/PROM:   A/PROM RIGHT   eval   RIGHT   Shoulder extension 57   Shoulder flexion 140 WNL with tightness at pec at end range   Shoulder abduction 145 numbness around  90 degrees WNL   Shoulder internal rotation 70   Shoulder external rotation 95                           (Blank rows = not tested)   A/PROM LEFT   eval LEFT   Shoulder extension 61   Shoulder flexion 149 WNL  Shoulder abduction 173 WNL   Shoulder internal rotation 74   Shoulder external rotation 95                           (Blank rows = not tested)     CERVICAL AROM: All within normal limits:    UPPER EXTREMITY STRENGTH: WNL        TODAY'S TREATMENT   08/08/2021:  Pt feels that she has made remarkable improvement and she now only has minor tingling in fingertips when she wakes up in the morning. She feels that her ROM has returned to normal. She continues to do the stretching exercises and feels they have really helped her. She has one small area at "T" intersection of right breast that had a small amount of drainage after a day at the beach in a wet bathing suit last week.  She has not had a recurrence of that but feels the area is a little rough to the touch.  She was encouraged to continue scar massage with Vitamin E oil.  Used mirror for visual cues of how how stretching of arm and lower trunk rotation can stretch the skin of this area. Recommended she do stretches to trunk and chest now to prepare for implant exchange emphasizing she will not be able to do any stretching to this area after surgery as she must protect this radiated skin from any  to help with healing of the implant incision.  Pt acknowledged all.  Also suggested she add right shoulder stretched in sidelying to add more stretches in varied directions.  No cording perceived. Pt has achieved goals and is ready for discharge. She was assured she can come back with a new order or call with questions at any time.   07/13/2021 Performed SOZO screen Soft tissue work to anterior lateral and posterior shoulder and scapular area with cocoa butter in supine and sidelying positions.  PROM right shoulder Flexion, scaption, D2  flexion,  Deep breathing with PT passive bilateral pec stretches. Single arm passive right pec stretch in supine x4.Ended session with supine streching over foam roll along spine with pt performing shoulder flexion, Horizontal abduction and abduction with ER x 5 ea , passive stretch with elbows on table x 5 Standing Jobes flex, scaption,and abduction against wall x 10 Instructed UT and levator stretches but did not give pictures  07/05/2021  Soft tissue work to anterior lateral and posterior shoulder and scapular area with cocoa butter in supine and sidelying positions.  Ended session with supine streching over rolled towel along spine with pt performing shoulder flexion, and abduction with ER x 5 ea and educated to do this at home Standing Bilateral scapular retraction , shoulder extension with green theraband and single arm ER x10 at dooknob for scapular/lat strengthening. Reviewed standing corner pec stretch and single arm pec stretch at doorway. Updated HEP with pictures   07/03/2021 AROM to neck, scapulae, shoulders and upper thoracic region with attention to neural flossing activites. Soft tissue work to anterior lateral and posterior shoulder and scapular area with cocoa butter in supine and sidelying positions. Ended session with supine streching over rolled towel along sping and educated to do this at home for polonged stretch to anterior shoulder. Standing scapulare restraction with green theraband at dooknob to activate lats for more normal muscle contraction pattern.  PATIENT EDUCATION:  07/05/2021 Scapular retraction, shoulder extension, ER with green band x 10 Person educated :pt Method:explanation, demonstration handout Pt return demonstrated  HOME EXERCISE PROGRAM: continue stretches as before. Add cervical rotation for neural flossing. Add standing scap retraction , ext, ER with green band x 10 ASSESSMENT:   CLINICAL IMPRESSION:   Pt has achieved goals and is ready for discharge.  She was assured she can come back with a new order or call with questions at any time.    OBJECTIVE IMPAIRMENTS decreased ROM, increased fascial restrictions, impaired flexibility, postural dysfunction, and numbness/tingling .    ACTIVITY LIMITATIONS lifting, reach over head, and down, reaching to the side   PARTICIPATION LIMITATIONS:  activities requiring reaching down, in front or to the side   PERSONAL FACTORS 1-2 comorbidities: bilateral breast cancer with right sided radiation  are also affecting patient's functional outcome.    REHAB POTENTIAL: Excellent   CLINICAL DECISION MAKING: Stable/uncomplicated   EVALUATION COMPLEXITY: Low   GOALS: Goals reviewed with patient? Yes   SHORT TERM GOALS = LTGS: Target date: 08/04/2021     Pt will be independent with HEP for shoulder/trunk ROM Baseline: Goal status: met   2.  Pt will report right finger tingling decreased by 50% or greater Baseline:  Goal status: INITIAL  the right finger by 50% or greater Baseline:  Goal status: met   2.  Pt will restore right shoulder ROM to full without complaints of tingling Baseline:  Goal status: met only minor tightness at end range    3.  Pt will be able to lie with arms outstretched to 90 deg abd with right elbow straight Baseline: very flexed Goal status: met       PLAN: PT FREQUENCY: 1-2x/week   PT DURATION: 6 weeks   PLANNED INTERVENTIONS: Therapeutic exercises, Therapeutic activity, Neuromuscular re-education, Patient/Family education, Joint mobilization, Orthotic/Fit training, Manual lymph drainage, and Manual therapy, dry needling   PLAN FOR NEXT SESSION: Discharge from [place]. Owens Shark PT  Norwood Levo, PT 08/08/2021, 12:44 PM

## 2021-08-28 ENCOUNTER — Ambulatory Visit: Payer: 59 | Admitting: Rehabilitation

## 2021-08-31 NOTE — H&P (Addendum)
Subjective:     Patient ID: Kaitlin [name] is a 46 y.o. female.   Follow-up     9 months post op bilateral mastectomies with immediate TE reconstruction.     Presented with palpable right breast mass, first noted by her GYN. Diagnostic MMG/US showed right breast 3.3 cm mass in the 9:30 o'clock position, 5 mm probable satellite malignancy 9 mm inferomedial to the larger mass in the 9:30 o'clock position of the right breast, and multiple groups of calcifications in the upper outer, upper inner and lower inner quadrants of the right breast. A 2.5 cm group of calcifications in left UOQ noted. Biopsies labeled right breast upper outer 930 and right breast lower innter showed IDC with DCIS, ER/PR+, Her2-. Left breast biopsy upper outer showed intermediate grade DCIS with calcs, necrosis.   Right mastectomy recommended, patient elected for bilateral mastectomies. Final pathology LEFT 2.2 cm DCIS intermediate to high grade, margins clear. RIGHT breast 3.8 cm IDC with DCIS, margins clear, 1/3 SLN+ with focal extranodal extension.   Mammaprint low risk. Completed RT 03/14/2021. On tamoxifen.   Genetics negative. 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   Prior 32E or 34DDD. Desires smaller. 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 Lives with spouse and two sons.   CT chest 06/2021. Pulmonary nodules noted and f/u CT 3-6 mo recommended.      Review of Systems      Objective:   Physical Exam Cardiovascular:     Rate and Rhythm: Normal rate and regular rhythm.     Heart sounds: Normal heart sounds.  Pulmonary:     Effort: Pulmonary effort is normal.     Breath sounds: Normal breath sounds.     Chest: bilateral chest expanded, right chest hyperpigmentation consistent with RT Bilateral rippling present greater on left     Assessment:     Left breast DCIS, Right breast IDC ER+ multifocal S/p bilateral SRM, right SLN, bilateral prepectoral TE/ADM (Alloderm) reconstruction Adjuvant radiation    Plan:     Pictures today.   Reviewed radiation will increase risks reconstruction including chance complications including contracture wound healing problems. In setting implant based reconstruction latter can mean exposure or extrusion implant. Reviewed timing of additional reconstruction surgery approximately 3-6 months from end RT.    Counseled encouraging data with prepectoral position, use ADM with regards to radiation. However some type of autologous tissue reconstruction post radiation can decrease risks back to baseline. Counseled options of implant exchange alone, LD flap over implant, or referral to microsurgeon to discuss other autologous options. Reviewed LD donor site risks including weakness, seroma. Discussed if any complications from implant exchange LD flap would be recommended with future reconstruction attempts. Declines referral to microsurgeon, declines LD flap at time of implant exchnage.   Reviewed radiated reconstruction will always have different appearance than opposite reconstruction. In setting implant based reconstruction this is generally more rounder less ptotic appearance.   Reviewed saline vs silicone, shaped v round. As in prepectoral position I recommend HCG or capacity filled silicone implants to reduce risk visible rippling. Reviewed MRI or [place] surveillance for rupture with silicone implants. Reviewed examples for 4th generation, capacity filled 4th generation, and HCG implants vs saline implants. Reviewed smooth vs textured and ALCL risk with latter, purpose of texturing. Reviewed reports SCC capsule. Discussed any type of implant may mask recurrence or  mass at chest wall. She has elected for silicone plan smooth round.   Reviewed purpose fat grafting to thicken flaps, reduce visible  rippling. Reviewed variable take graft, may need to repeat, fat necrosis that presents as masses, abdominal incisions, pain need for compression. Patient declines at this time.   Reviewed OP surgery no drains anticipated. Additional risks including but not limited to bleeding hematoma seroma infection damage to adjacent structure need for additional procedures blood clots in legs or lungs reviewed.    Complete [facility] physician patient checklist.    Rx for robaxin and bactrim given. Patient has tramadol at home. Had problems with constipation- counseled tylenol and motrin alternating would be fine. Hold tamoxifen 10 d prior to surgery. Reviewed patient does not desire larger than present in expander and if possible smaller. Reviewed sizing based in part and base width chest and cannot assure her size.      Natrelle [id number]-12-T 400 ml tissue expanders placed bilateral,  RIGHT 300 ml saline total fill volume LEFT 300 ml saline total fill volume

## 2021-09-08 ENCOUNTER — Encounter (HOSPITAL_BASED_OUTPATIENT_CLINIC_OR_DEPARTMENT_OTHER): Payer: Self-pay | Admitting: Plastic Surgery

## 2021-09-08 NOTE — Progress Notes (Signed)
Patient was provided with CHG cleanser to use at home before the procedure. Patient verbalized understanding of instructions.       Patient Instructions  The night before surgery:  No food after midnight. ONLY clear liquids after midnight  The day of surgery (if you do NOT have diabetes):  Drink ONE (1) Pre-Surgery Clear Ensure as directed.   This drink was given to you during your hospital  pre-op appointment visit. The pre-op nurse will instruct you on the time to drink the  Pre-Surgery Ensure depending on your surgery time. Finish the drink at the designated time by the pre-op nurse.  Nothing else to drink after completing the  Pre-Surgery Clear Ensure.  The day of surgery (if you have diabetes): Drink ONE (1) Gatorade 2 (G2) as directed. This drink was given to you during your hospital  pre-op appointment visit.  The pre-op nurse will instruct you on the time to drink the   Gatorade 2 (G2) depending on your surgery time. Color of the Gatorade may vary. Red is not allowed. Nothing else to drink after completing the  Gatorade 2 (G2).         If you have questions, please contact your surgeon's office.

## 2021-09-15 ENCOUNTER — Ambulatory Visit (HOSPITAL_BASED_OUTPATIENT_CLINIC_OR_DEPARTMENT_OTHER): Payer: 59 | Admitting: Anesthesiology

## 2021-09-15 ENCOUNTER — Encounter (HOSPITAL_BASED_OUTPATIENT_CLINIC_OR_DEPARTMENT_OTHER): Payer: Self-pay | Admitting: Plastic Surgery

## 2021-09-15 ENCOUNTER — Encounter (HOSPITAL_BASED_OUTPATIENT_CLINIC_OR_DEPARTMENT_OTHER)
Admission: RE | Disposition: A | Discharge status: Home / Self Care | Payer: Self-pay | Source: Home / Self Care | Attending: Plastic Surgery

## 2021-09-15 ENCOUNTER — Other Ambulatory Visit: Payer: Self-pay

## 2021-09-15 ENCOUNTER — Ambulatory Visit (HOSPITAL_BASED_OUTPATIENT_CLINIC_OR_DEPARTMENT_OTHER)
Admission: RE | Admit: 2021-09-15 | Discharge: 2021-09-15 | Disposition: A | Discharge status: Home / Self Care | Payer: 59 | Attending: Plastic Surgery | Admitting: Plastic Surgery

## 2021-09-15 DIAGNOSIS — Z853 Personal history of malignant neoplasm of breast: Secondary | ICD-10-CM

## 2021-09-15 DIAGNOSIS — Z45812 Encounter for adjustment or removal of left breast implant: Secondary | ICD-10-CM | POA: Diagnosis not present

## 2021-09-15 DIAGNOSIS — Z923 Personal history of irradiation: Secondary | ICD-10-CM | POA: Insufficient documentation

## 2021-09-15 DIAGNOSIS — Z45811 Encounter for adjustment or removal of right breast implant: Secondary | ICD-10-CM

## 2021-09-15 DIAGNOSIS — Z421 Encounter for breast reconstruction following mastectomy: Secondary | ICD-10-CM | POA: Diagnosis present

## 2021-09-15 DIAGNOSIS — Z01818 Encounter for other preprocedural examination: Secondary | ICD-10-CM

## 2021-09-15 DIAGNOSIS — Z9010 Acquired absence of unspecified breast and nipple: Secondary | ICD-10-CM

## 2021-09-15 HISTORY — PX: REMOVAL OF BILATERAL TISSUE EXPANDERS WITH PLACEMENT OF BILATERAL BREAST IMPLANTS: SHX6431

## 2021-09-15 LAB — POCT PREGNANCY, URINE: Preg Test, Ur: NEGATIVE

## 2021-09-15 SURGERY — REMOVAL, TISSUE EXPANDER, BREAST, BILATERAL, WITH BILATERAL IMPLANT IMPLANT INSERTION
Anesthesia: General | Site: Breast | Laterality: Bilateral

## 2021-09-15 MED ORDER — CHLORHEXIDINE GLUCONATE CLOTH 2 % EX PADS: 6.0000 | MEDICATED_PAD | Freq: Once | CUTANEOUS | Status: DC

## 2021-09-15 MED ORDER — GABAPENTIN 300 MG PO CAPS
ORAL_CAPSULE | ORAL | Status: AC
  Filled 2021-09-15: qty 1

## 2021-09-15 MED ORDER — BUPIVACAINE HCL (PF) 0.5 % IJ SOLN
INTRAMUSCULAR | Status: AC
  Filled 2021-09-15: qty 60

## 2021-09-15 MED ORDER — ONDANSETRON HCL 4 MG/2ML IJ SOLN: 4.0000 mg | Freq: Four times a day (QID) | INTRAMUSCULAR | Status: DC | PRN

## 2021-09-15 MED ORDER — LIDOCAINE 2% (20 MG/ML) 5 ML SYRINGE
INTRAMUSCULAR | Status: AC
  Filled 2021-09-15: qty 5

## 2021-09-15 MED ORDER — MIDAZOLAM HCL 5 MG/5ML IJ SOLN
INTRAMUSCULAR | Status: DC | PRN
  Administered 2021-09-15: 2 mg via INTRAVENOUS

## 2021-09-15 MED ORDER — EPHEDRINE SULFATE (PRESSORS) 50 MG/ML IJ SOLN
INTRAMUSCULAR | Status: DC | PRN
  Administered 2021-09-15 (×2): 10 mg via INTRAVENOUS

## 2021-09-15 MED ORDER — VANCOMYCIN HCL IN DEXTROSE 1-5 GM/200ML-% IV SOLN
1000.0000 mg | INTRAVENOUS | Status: AC
  Administered 2021-09-15: 1000 mg via INTRAVENOUS

## 2021-09-15 MED ORDER — ONDANSETRON 4 MG PO TBDP
ORAL_TABLET | ORAL | Status: AC
  Filled 2021-09-15: qty 1

## 2021-09-15 MED ORDER — ATROPINE SULFATE 0.4 MG/ML IV SOLN
INTRAVENOUS | Status: AC
  Filled 2021-09-15: qty 1

## 2021-09-15 MED ORDER — LIDOCAINE HCL (CARDIAC) PF 100 MG/5ML IV SOSY
PREFILLED_SYRINGE | INTRAVENOUS | Status: DC | PRN
  Administered 2021-09-15: 60 mg via INTRAVENOUS

## 2021-09-15 MED ORDER — ACETAMINOPHEN 500 MG PO TABS
1000.0000 mg | ORAL_TABLET | ORAL | Status: AC
  Administered 2021-09-15: 1000 mg via ORAL

## 2021-09-15 MED ORDER — EPHEDRINE 5 MG/ML INJ
INTRAVENOUS | Status: AC
  Filled 2021-09-15: qty 5

## 2021-09-15 MED ORDER — FENTANYL CITRATE (PF) 100 MCG/2ML IJ SOLN
INTRAMUSCULAR | Status: DC | PRN
  Administered 2021-09-15 (×2): 50 ug via INTRAVENOUS

## 2021-09-15 MED ORDER — HYDROMORPHONE HCL 1 MG/ML IJ SOLN
INTRAMUSCULAR | Status: AC
  Filled 2021-09-15: qty 1

## 2021-09-15 MED ORDER — ACETAMINOPHEN 500 MG PO TABS
ORAL_TABLET | ORAL | Status: AC
  Filled 2021-09-15: qty 2

## 2021-09-15 MED ORDER — FENTANYL CITRATE (PF) 100 MCG/2ML IJ SOLN
INTRAMUSCULAR | Status: AC
  Filled 2021-09-15: qty 2

## 2021-09-15 MED ORDER — SCOPOLAMINE 1 MG/3DAYS TD PT72
1.0000 | MEDICATED_PATCH | TRANSDERMAL | Status: DC
  Administered 2021-09-15: 1.5 mg via TRANSDERMAL

## 2021-09-15 MED ORDER — OXYCODONE HCL 5 MG PO TABS
ORAL_TABLET | ORAL | Status: AC
  Filled 2021-09-15: qty 1

## 2021-09-15 MED ORDER — ONDANSETRON HCL 4 MG/2ML IJ SOLN
INTRAMUSCULAR | Status: AC
  Filled 2021-09-15: qty 2

## 2021-09-15 MED ORDER — ONDANSETRON 4 MG PO TBDP
4.0000 mg | ORAL_TABLET | Freq: Once | ORAL | Status: AC
  Administered 2021-09-15: 4 mg via ORAL

## 2021-09-15 MED ORDER — LACTATED RINGERS IV SOLN
INTRAVENOUS | Status: DC
Start: 2021-09-15 — End: 2021-09-15

## 2021-09-15 MED ORDER — SCOPOLAMINE 1 MG/3DAYS TD PT72
MEDICATED_PATCH | TRANSDERMAL | Status: AC
Start: 2021-09-15 — End: ?
  Filled 2021-09-15: qty 1

## 2021-09-15 MED ORDER — SUCCINYLCHOLINE CHLORIDE 200 MG/10ML IV SOSY
PREFILLED_SYRINGE | INTRAVENOUS | Status: AC
  Filled 2021-09-15: qty 10

## 2021-09-15 MED ORDER — DEXAMETHASONE SODIUM PHOSPHATE 10 MG/ML IJ SOLN
INTRAMUSCULAR | Status: AC
  Filled 2021-09-15: qty 1

## 2021-09-15 MED ORDER — PHENYLEPHRINE 80 MCG/ML (10ML) SYRINGE FOR IV PUSH (FOR BLOOD PRESSURE SUPPORT)
PREFILLED_SYRINGE | INTRAVENOUS | Status: AC
  Filled 2021-09-15: qty 10

## 2021-09-15 MED ORDER — PROPOFOL 10 MG/ML IV BOLUS
INTRAVENOUS | Status: DC | PRN
  Administered 2021-09-15: 150 mg via INTRAVENOUS

## 2021-09-15 MED ORDER — PROPOFOL 500 MG/50ML IV EMUL
INTRAVENOUS | Status: DC | PRN
  Administered 2021-09-15: 25 ug/kg/min via INTRAVENOUS

## 2021-09-15 MED ORDER — CELECOXIB 200 MG PO CAPS
200.0000 mg | ORAL_CAPSULE | ORAL | Status: AC
  Administered 2021-09-15: 200 mg via ORAL

## 2021-09-15 MED ORDER — ONDANSETRON HCL 4 MG/2ML IJ SOLN
INTRAMUSCULAR | Status: DC | PRN
  Administered 2021-09-15: 4 mg via INTRAVENOUS

## 2021-09-15 MED ORDER — HYDROMORPHONE HCL 1 MG/ML IJ SOLN
INTRAMUSCULAR | Status: DC | PRN
  Administered 2021-09-15: .5 mg via INTRAVENOUS

## 2021-09-15 MED ORDER — OXYCODONE HCL 5 MG PO TABS: 5.0000 mg | ORAL_TABLET | Freq: Once | ORAL | Status: DC | PRN

## 2021-09-15 MED ORDER — CELECOXIB 200 MG PO CAPS
ORAL_CAPSULE | ORAL | Status: AC
Start: 2021-09-15 — End: ?
  Filled 2021-09-15: qty 1

## 2021-09-15 MED ORDER — GABAPENTIN 300 MG PO CAPS
300.0000 mg | ORAL_CAPSULE | ORAL | Status: AC
  Administered 2021-09-15: 300 mg via ORAL

## 2021-09-15 MED ORDER — BUPIVACAINE HCL (PF) 0.5 % IJ SOLN
INTRAMUSCULAR | Status: DC | PRN
  Administered 2021-09-15: 30 mL

## 2021-09-15 MED ORDER — MIDAZOLAM HCL 2 MG/2ML IJ SOLN
INTRAMUSCULAR | Status: AC
  Filled 2021-09-15: qty 2

## 2021-09-15 MED ORDER — DEXAMETHASONE SODIUM PHOSPHATE 4 MG/ML IJ SOLN
INTRAMUSCULAR | Status: DC | PRN
  Administered 2021-09-15: 5 mg via INTRAVENOUS

## 2021-09-15 MED ORDER — VANCOMYCIN HCL IN DEXTROSE 1-5 GM/200ML-% IV SOLN
INTRAVENOUS | Status: AC
  Filled 2021-09-15: qty 200

## 2021-09-15 MED ORDER — OXYCODONE HCL 5 MG/5ML PO SOLN: 5.0000 mg | Freq: Once | ORAL | Status: DC | PRN

## 2021-09-15 MED ORDER — FENTANYL CITRATE (PF) 100 MCG/2ML IJ SOLN
25.0000 ug | INTRAMUSCULAR | Status: DC | PRN
  Administered 2021-09-15: 25 ug via INTRAVENOUS

## 2021-09-15 SURGICAL SUPPLY — 67 items
ADH SKN CLS APL DERMABOND .7 (GAUZE/BANDAGES/DRESSINGS) ×2
APL PRP STRL LF DISP 70% ISPRP (MISCELLANEOUS) ×2
BAG DECANTER FOR FLEXI CONT (MISCELLANEOUS) ×1 IMPLANT
BINDER BREAST 3XL (GAUZE/BANDAGES/DRESSINGS) IMPLANT
BINDER BREAST LRG (GAUZE/BANDAGES/DRESSINGS) IMPLANT
BINDER BREAST MEDIUM (GAUZE/BANDAGES/DRESSINGS) IMPLANT
BINDER BREAST XLRG (GAUZE/BANDAGES/DRESSINGS) IMPLANT
BINDER BREAST XXLRG (GAUZE/BANDAGES/DRESSINGS) IMPLANT
BLADE SURG 10 STRL SS (BLADE) ×2 IMPLANT
BNDG GAUZE DERMACEA FLUFF (GAUZE/BANDAGES/DRESSINGS) ×2
BNDG GAUZE DERMACEA FLUFF 4 (GAUZE/BANDAGES/DRESSINGS) ×2 IMPLANT
BNDG GZE DERMACEA 4 6PLY (GAUZE/BANDAGES/DRESSINGS) ×2
CANISTER SUCT 1200ML W/VALVE (MISCELLANEOUS) ×1 IMPLANT
CHLORAPREP W/TINT 26 (MISCELLANEOUS) ×2 IMPLANT
COVER BACK TABLE 60X90IN (DRAPES) ×1 IMPLANT
COVER MAYO STAND STRL (DRAPES) ×1 IMPLANT
DERMABOND ADVANCED (GAUZE/BANDAGES/DRESSINGS) ×2
DERMABOND ADVANCED .7 DNX12 (GAUZE/BANDAGES/DRESSINGS) ×2 IMPLANT
DRAIN CHANNEL 15F RND FF W/TCR (WOUND CARE) IMPLANT
DRAPE TOP ARMCOVERS (MISCELLANEOUS) ×1 IMPLANT
DRAPE U-SHAPE 76X120 STRL (DRAPES) ×1 IMPLANT
DRAPE UTILITY XL STRL (DRAPES) ×1 IMPLANT
DRSG PAD ABDOMINAL 8X10 ST (GAUZE/BANDAGES/DRESSINGS) ×2 IMPLANT
ELECT BLADE 4.0 EZ CLEAN MEGAD (MISCELLANEOUS)
ELECT COATED BLADE 2.86 ST (ELECTRODE) ×1 IMPLANT
ELECT REM PT RETURN 9FT ADLT (ELECTROSURGICAL) ×1
ELECTRODE BLDE 4.0 EZ CLN MEGD (MISCELLANEOUS) IMPLANT
ELECTRODE REM PT RTRN 9FT ADLT (ELECTROSURGICAL) ×1 IMPLANT
EVACUATOR SILICONE 100CC (DRAIN) IMPLANT
GLOVE BIO SURGEON STRL SZ 6 (GLOVE) ×2 IMPLANT
GOWN STRL REUS W/ TWL LRG LVL3 (GOWN DISPOSABLE) ×2 IMPLANT
GOWN STRL REUS W/TWL LRG LVL3 (GOWN DISPOSABLE) ×2
IMPL BREAST P4.2XMDRT 345 (Breast) IMPLANT
IMPL BRST P4.2XMDRT 345CC (Breast) ×2 IMPLANT
IMPLANT BREAST GEL 345CC (Breast) ×2 IMPLANT
KIT FILL ASEPTIC TRANSFER (MISCELLANEOUS) IMPLANT
MARKER SKIN DUAL TIP RULER LAB (MISCELLANEOUS) IMPLANT
NDL FILTER BLUNT 18X1 1/2 (NEEDLE) IMPLANT
NDL HYPO 25X1 1.5 SAFETY (NEEDLE) IMPLANT
NEEDLE FILTER BLUNT 18X 1/2SAF (NEEDLE)
NEEDLE FILTER BLUNT 18X1 1/2 (NEEDLE) IMPLANT
NEEDLE HYPO 25X1 1.5 SAFETY (NEEDLE) ×1 IMPLANT
PACK BASIN DAY SURGERY FS (CUSTOM PROCEDURE TRAY) ×1 IMPLANT
PENCIL SMOKE EVACUATOR (MISCELLANEOUS) ×1 IMPLANT
PIN SAFETY STERILE (MISCELLANEOUS) IMPLANT
SHEET MEDIUM DRAPE 40X70 STRL (DRAPES) ×1 IMPLANT
SIZER BREAST 345CC (SIZER) ×1
SIZER BRST P4.2XMDRT 345CC (SIZER) IMPLANT
SLEEVE SCD COMPRESS KNEE MED (STOCKING) ×1 IMPLANT
SPIKE FLUID TRANSFER (MISCELLANEOUS) IMPLANT
SPONGE T-LAP 18X18 ~~LOC~~+RFID (SPONGE) ×1 IMPLANT
STAPLER VISISTAT 35W (STAPLE) ×1 IMPLANT
SUT ETHILON 2 0 FS 18 (SUTURE) IMPLANT
SUT MNCRL AB 4-0 PS2 18 (SUTURE) IMPLANT
SUT PDS AB 2-0 CT2 27 (SUTURE) IMPLANT
SUT VIC AB 3-0 PS1 18 (SUTURE)
SUT VIC AB 3-0 PS1 18XBRD (SUTURE) IMPLANT
SUT VIC AB 3-0 SH 27 (SUTURE) ×1
SUT VIC AB 3-0 SH 27X BRD (SUTURE) IMPLANT
SUT VICRYL 4-0 PS2 18IN ABS (SUTURE) IMPLANT
SYR 20ML LL LF (SYRINGE) IMPLANT
SYR BULB IRRIG 60ML STRL (SYRINGE) ×2 IMPLANT
SYR CONTROL 10ML LL (SYRINGE) IMPLANT
TOWEL GREEN STERILE FF (TOWEL DISPOSABLE) ×2 IMPLANT
TUBE CONNECTING 20X1/4 (TUBING) ×1 IMPLANT
UNDERPAD 30X36 HEAVY ABSORB (UNDERPADS AND DIAPERS) ×2 IMPLANT
YANKAUER SUCT BULB TIP NO VENT (SUCTIONS) ×1 IMPLANT

## 2021-09-15 NOTE — Anesthesia Preprocedure Evaluation (Signed)
Anesthesia Evaluation  Patient identified by MRN, date of birth, ID band Patient awake    Reviewed: Allergy & Precautions, H&P , NPO status , Patient's Chart, lab work & pertinent test results  History of Anesthesia Complications (+) PONV and history of anesthetic complications  Airway Mallampati: I   Neck ROM: full    Dental   Pulmonary asthma , former smoker,    breath sounds clear to auscultation       Cardiovascular negative cardio ROS   Rhythm:regular Rate:Normal     Neuro/Psych  Headaches,    GI/Hepatic   Endo/Other    Renal/GU      Musculoskeletal   Abdominal   Peds  Hematology   Anesthesia Other Findings   Reproductive/Obstetrics                             Anesthesia Physical Anesthesia Plan  ASA: 2  Anesthesia Plan: General   Post-op Pain Management:    Induction: Intravenous  PONV Risk Score and Plan: 4 or greater and Ondansetron, Dexamethasone, Midazolam and Treatment may vary due to age or medical condition  Airway Management Planned: LMA  Additional Equipment:   Intra-op Plan:   Post-operative Plan: Extubation in OR  Informed Consent: I have reviewed the patients History and Physical, chart, labs and discussed the procedure including the risks, benefits and alternatives for the proposed anesthesia with the patient or authorized representative who has indicated his/her understanding and acceptance.     Dental advisory given  Plan Discussed with: Anesthesiologist, CRNA and Surgeon  Anesthesia Plan Comments:         Anesthesia Quick Evaluation

## 2021-09-15 NOTE — Transfer of Care (Signed)
Immediate Anesthesia Transfer of Care Note  Patient: Kaitlin [name]  Procedure(s) Performed: REMOVAL OF BILATERAL TISSUE EXPANDERS WITH PLACEMENT OF BILATERAL BREAST IMPLANTS (Bilateral: Breast)  Patient Location: PACU  Anesthesia Type:General  Level of Consciousness: awake, alert , oriented, drowsy and patient cooperative  Airway & Oxygen Therapy: Patient Spontanous Breathing and Patient connected to face mask oxygen  Post-op Assessment: Report given to RN and Post -op Vital signs reviewed and stable  Post vital signs: Reviewed and stable  Last Vitals:  Vitals Value Taken Time  BP    Temp    Pulse    Resp    SpO2      Last Pain:  Vitals:   09/15/21 0633  TempSrc: Oral  PainSc: 0-No pain      Patients Stated Pain Goal: 5 (57/32/20 2542)  Complications: No notable events documented.

## 2021-09-15 NOTE — Discharge Instructions (Signed)

## 2021-09-15 NOTE — Op Note (Addendum)
Operative Note   DATE OF OPERATION: 8.25.23  LOCATION: [facility]-outpatient  SURGICAL DIVISION: Plastic Surgery  PREOPERATIVE DIAGNOSES:  1. History breast cancer 2. Acquired absence breasts 3. History therapeutic radiation  POSTOPERATIVE DIAGNOSES:  same  PROCEDURE:  Removal bilateral chest tissue expanders and placement silicone implants  SURGEON: Irene Limbo MD MBA  ASSISTANT: none  ANESTHESIA:  General.   EBL: 25 ml  COMPLICATIONS: None immediate.   INDICATIONS FOR PROCEDURE:  The patient, Kaitlin [name], is a 46 y.o. female born on 08-Apr-1975, is here for staged breast reconstruction following bilateral prepectoral tissue expander reconstruction.   FINDINGS: Complete incorporation ADM noted bilateral. Natrelle Soft Touch smooth round moderate profile implants placed bilateral. REF [id number] RIGHT SN [id number] LEFT SN [id number]  DESCRIPTION OF PROCEDURE:  The patient's operative site was marked with the patient in the preoperative area. The patient was taken to the operating room. SCDs were placed and IV antibiotics were given. The patient's operative site was prepped and draped in a sterile fashion. A time out was performed and all information was confirmed to be correct.  Incision made in bilateral inframammary fold scar and carried through superficial fascia and ADM. Expanders removed. Over left chest thermal capsulorraphy performed over inframammary fold and desired anterior axillary line. Over right chest superior capsulotomy performed. Sizer placed in each cavity. Moderate profile 345 ml implants selected for bilateral placement.   Each cavity irrigated with saline Betadine solution. Hemostasis ensured. Local anesthetic infiltrated in each chest. Implant placed in left chest cavity. Implant orientation ensured. Closure completed with 3-0 vicryl to approximated capsule and superficial fascia, 4-0 vicryl in dermis, and 4-0 monocryl subcuticular skin closure. Over  right chest, implant placed. Implant orientation ensured. Closure completed with 3-0 vicryl to approximated capsule and superficial fascia, 4-0 vicryl in dermis, and 4-0 monocryl subcuticular skin closure. Dermabond applied to incisions followed by dry dressing, breast binder.  The patient was allowed to wake from anesthesia, extubated and taken to the recovery room in satisfactory condition.   SPECIMENS: none  DRAINS: none  Irene Limbo, MD [facility] Plastic [facility] Reconstructive Surgery  Office/ physician access line after hours [phone]

## 2021-09-15 NOTE — Anesthesia Procedure Notes (Signed)
Procedure Name: LMA Insertion Date/Time: 09/15/2021 7:30 AM  Performed by: Willa Frater, CRNAPre-anesthesia Checklist: Patient identified, Emergency Drugs available, Suction available and Patient being monitored Patient Re-evaluated:Patient Re-evaluated prior to induction Oxygen Delivery Method: Circle System Utilized Preoxygenation: Pre-oxygenation with 100% oxygen Induction Type: IV induction Ventilation: Mask ventilation without difficulty LMA: LMA inserted LMA Size: 4.0 Number of attempts: 1 Airway Equipment and Method: bite block Placement Confirmation: positive ETCO2 Tube secured with: Tape Dental Injury: Teeth and Oropharynx as per pre-operative assessment

## 2021-09-15 NOTE — Interval H&P Note (Signed)
History and Physical Interval Note:  09/15/2021 6:47 AM  Kaitlin [name]  has presented today for surgery, with the diagnosis of history breast cancer, acquired absence breasts, history therapeutic radiation.  The various methods of treatment have been discussed with the patient and family. After consideration of risks, benefits and other options for treatment, the patient has consented to  Procedure(s): REMOVAL OF BILATERAL TISSUE EXPANDERS WITH PLACEMENT OF BILATERAL BREAST IMPLANTS (Bilateral) as a surgical intervention.  The patient's history has been reviewed, patient examined, no change in status, stable for surgery.  I have reviewed the patient's chart and labs.  Questions were answered to the patient's satisfaction.     Arnoldo Hooker Nadirah Socorro

## 2021-09-16 NOTE — Anesthesia Postprocedure Evaluation (Signed)
Anesthesia Post Note  Patient: Kaitlin [name]  Procedure(s) Performed: REMOVAL OF BILATERAL TISSUE EXPANDERS WITH PLACEMENT OF BILATERAL BREAST IMPLANTS (Bilateral: Breast)     Patient location during evaluation: PACU Anesthesia Type: General Level of consciousness: awake and alert Pain management: pain level controlled Vital Signs Assessment: post-procedure vital signs reviewed and stable Respiratory status: spontaneous breathing, nonlabored ventilation, respiratory function stable and patient connected to nasal cannula oxygen Cardiovascular status: blood pressure returned to baseline and stable Postop Assessment: no apparent nausea or vomiting Anesthetic complications: no   No notable events documented.  Last Vitals:  Vitals:   09/15/21 0949 09/15/21 0956  BP:  115/69  Pulse: 93 81  Resp: 13 14  Temp:  36.5 C  SpO2: 98% 97%    Last Pain:  Vitals:   09/15/21 0956  TempSrc:   PainSc: 0-No pain                 [name] S

## 2021-09-20 NOTE — Progress Notes (Signed)
Patient Care Team: [name], MD as PCP - General (Family Medicine) [name], MD as Consulting Physician (Hematology and Oncology) Irene Limbo, MD as Consulting Physician (Plastic Surgery) Kyung Rudd, MD as Consulting Physician (Radiation Oncology) Rolm Bookbinder, MD as Consulting Physician (General Surgery)  DIAGNOSIS: No diagnosis found.  SUMMARY OF ONCOLOGIC HISTORY: Oncology History  Bilateral breast cancer ([place])  11/01/2020 Initial Diagnosis   Right breast biopsy UOQ 9:30 position: Grade 2 IDC with DCIS ER 100%, PR [place], Ki-67 2%, HER2 negative Right breast biopsy LIQ: Grade 1 IDC with DCIS Left breast biopsy UOQ: DCIS intermediate grade    11/01/2020 Cancer Staging   Staging form: Breast, AJCC 8th Edition - Clinical: Stage IIA (cT3, cN0, cM0, G2, ER+, PR+, HER2-) - Signed by [name], MD on 11/01/2020 Stage prefix: Initial diagnosis Histologic grading system: 3 grade system   11/16/2020 Genetic Testing   Negative genetic testing on the CancerNext-Expanded+RNAinsight.  The report date is 11/16/2020  The CancerNext-Expanded gene panel offered by [facility] and includes sequencing and rearrangement analysis for the following 77 genes: AIP, ALK, APC*, ATM*, AXIN2, BAP1, BARD1, BLM, BMPR1A, BRCA1*, BRCA2*, BRIP1*, CDC73, CDH1*, CDK4, CDKN1B, CDKN2A, CHEK2*, CTNNA1, DICER1, FANCC, FH, FLCN, GALNT12, KIF1B, LZTR1, MAX, MEN1, MET, MLH1*, MSH2*, MSH3, MSH6*, MUTYH*, NBN, NF1*, NF2, NTHL1, PALB2*, PHOX2B, PMS2*, POT1, PRKAR1A, PTCH1, PTEN*, RAD51C*, RAD51D*, RB1, RECQL, RET, SDHA, SDHAF2, SDHB, SDHC, SDHD, SMAD4, SMARCA4, SMARCB1, SMARCE1, STK11, SUFU, TMEM127, TP53*, TSC1, TSC2, VHL and XRCC2 (sequencing and deletion/duplication); EGFR, EGLN1, HOXB13, KIT, MITF, PDGFRA, POLD1, and POLE (sequencing only); EPCAM and GREM1 (deletion/duplication only). DNA and RNA analyses performed for * genes.    12/06/2020 Surgery   Bilateral Mastectomies:  Left  Mastectomy:HG DCIS 2.2 cm with necrosis, Margins Neg ER 90%, PR 40% Right Mastectomy: Grade 2 IDC with DCIS 3.8 cm, 1/3 LN ER 100%, PR 100%, Her 2 Neg, Ki 67: 2-15%   12/13/2020 Miscellaneous   MammaPrint: Luminal type A, low risk   01/27/2021 - 03/14/2021 Radiation Therapy   Site Technique Total Dose (Gy) Dose per Fx (Gy) Completed Fx Beam Energies  Chest Wall, Right: CW_R 3D 50.4/50.4 1.8 28/28 6XFFF  Chest Wall, Right: CW_R_SCLV 3D 50.4/50.4 1.8 28/28 6X, 10X  Chest Wall, Right: CW_R_Bst Electron 10/10 2 5/5 6E     03/22/2021 -  Anti-estrogen oral therapy   Adjuvant tamoxifen   Ductal carcinoma in situ (DCIS) of left breast  01/03/2021 Initial Diagnosis   Ductal carcinoma in situ (DCIS) of left breast   06/21/2021 Cancer Staging   Staging form: Breast, AJCC 8th Edition - Clinical: Stage 0 (cTis (DCIS), cN0, cM0, ER+, PR+) - Signed by Gardenia Phlegm, NP on 06/21/2021     CHIEF COMPLIANT: Follow-up of bilateral breast cancer    INTERVAL HISTORY: Kaitlin [name] is a 46 y.o. with above-mentioned history of bilateral breast cancer having undergone bilateral mastectomies, currently on radiation therapy. She presents to the clinic today for follow-up.    ALLERGIES:  is allergic to ceclor [cefaclor], rizatriptan, and amoxicillin.  MEDICATIONS:  Current Outpatient Medications  Medication Sig Dispense Refill   MAGNESIUM PO Take by mouth.     OVER THE COUNTER MEDICATION Elderberry Syrup. 30 ml daily.     OVER THE COUNTER MEDICATION Probiotic 1 capsule daily     tamoxifen (NOLVADEX) 10 MG tablet Take 1 tablet (10 mg total) by mouth daily. 90 tablet 3   No current facility-administered medications for this visit.    PHYSICAL EXAMINATION: ECOG PERFORMANCE  STATUS: {CHL ONC ECOG PS:[phone]}  There were no vitals filed for this visit. There were no vitals filed for this visit.  BREAST:*** No palpable masses or nodules in either right or left breasts. No palpable axillary  supraclavicular or infraclavicular adenopathy no breast tenderness or nipple discharge. (exam performed in the presence of a chaperone)  LABORATORY DATA:  I have reviewed the data as listed    Latest Ref Rng & Units 04/06/2011   11:20 AM  CMP  Glucose 70 - 99 mg/dL 89   BUN 6 - 23 mg/dL 11   Creatinine 0.4 - 1.2 mg/dL 0.7   Sodium 135 - 145 mEq/L 140   Potassium 3.5 - 5.1 mEq/L 4.4   Chloride 96 - 112 mEq/L 107   CO2 19 - 32 mEq/L 28   Calcium 8.4 - 10.5 mg/dL 9.3     Lab Results  Component Value Date   WBC 17.7 (H) 03/21/2012   HGB 11.4 (L) 03/21/2012   HCT 33.4 (L) 03/21/2012   MCV 91.0 03/21/2012   PLT 267 03/21/2012   NEUTROABS 2.0 04/06/2011    ASSESSMENT & PLAN:  No problem-specific Assessment & Plan notes found for this encounter.    No orders of the defined types were placed in this encounter.  The patient has a good understanding of the overall plan. she agrees with it. she will call with any problems that may develop before the next visit here. Total time spent: 30 mins including face to face time and time spent for planning, charting and co-ordination of care   Suzzette Righter, [place] 09/20/21    I Gardiner Coins am scribing for Dr. Lindi Adie  ***

## 2021-09-21 ENCOUNTER — Inpatient Hospital Stay: Payer: 59 | Attending: Hematology and Oncology | Admitting: Hematology and Oncology

## 2021-09-21 ENCOUNTER — Other Ambulatory Visit: Payer: Self-pay | Admitting: *Deleted

## 2021-09-21 VITALS — BP 128/75 | HR 82 | Temp 97.9°F | Resp 18 | Ht 67.0 in | Wt 138.4 lb

## 2021-09-21 DIAGNOSIS — D0512 Intraductal carcinoma in situ of left breast: Secondary | ICD-10-CM

## 2021-09-21 DIAGNOSIS — R911 Solitary pulmonary nodule: Secondary | ICD-10-CM

## 2021-09-21 DIAGNOSIS — Z79810 Long term (current) use of selective estrogen receptor modulators (SERMs): Secondary | ICD-10-CM | POA: Insufficient documentation

## 2021-09-21 DIAGNOSIS — Z170 Estrogen receptor positive status [ER+]: Secondary | ICD-10-CM | POA: Insufficient documentation

## 2021-09-21 DIAGNOSIS — Z79899 Other long term (current) drug therapy: Secondary | ICD-10-CM | POA: Diagnosis not present

## 2021-09-21 DIAGNOSIS — Z923 Personal history of irradiation: Secondary | ICD-10-CM | POA: Insufficient documentation

## 2021-09-21 DIAGNOSIS — Z881 Allergy status to other antibiotic agents status: Secondary | ICD-10-CM | POA: Diagnosis not present

## 2021-09-21 DIAGNOSIS — C50411 Malignant neoplasm of upper-outer quadrant of right female breast: Secondary | ICD-10-CM | POA: Diagnosis present

## 2021-09-21 DIAGNOSIS — Z9013 Acquired absence of bilateral breasts and nipples: Secondary | ICD-10-CM | POA: Insufficient documentation

## 2021-09-21 DIAGNOSIS — Z880 Allergy status to penicillin: Secondary | ICD-10-CM | POA: Diagnosis not present

## 2021-09-21 MED ORDER — TAMOXIFEN CITRATE 20 MG PO TABS: 20.0000 mg | ORAL_TABLET | Freq: Every day | ORAL | 3 refills | Status: DC

## 2021-09-21 NOTE — Progress Notes (Signed)
Per MD request RN placed epic order for Signatera mobile draw.  RN also successfully faxed req and path report [phone]).

## 2021-09-21 NOTE — Assessment & Plan Note (Addendum)
12/06/20:Bilateral Mastectomies:  Left Mastectomy:HG DCIS 2.2 cm with necrosis, Margins Neg ER 90%, PR 40% Right Mastectomy: Grade 2 IDC with DCIS 3.8 cm, 1/3 LN ER 100%, PR 100%, Her 2 Neg, Ki 67: 2-15%  Treatment Plan: 1. Mammaprint: Luminal type a low risk 2. Adjuvant XRT completed 03/14/2021 3. Adjuvant Anti-estrogen therapy with tamoxifen to start 03/22/2021 (started at 5 mg increased to 10 mg and further increased to 20 mg 10/23/2021) ------------------------------------------------------------------------------------------------------------------- Tamoxifen toxicities: Severe hot flashes and mood swings: They have mostly subsided and she is able to tolerate 10 mg fairly well.  Encouraged her to increase the dosage to 20 mg daily if she can tolerate.  Breast cancer surveillance: 1.  Breast exam 09/21/2021: Benign 2. no role of imaging since she had bilateral mastectomies  Signatera testing: Recommended that we do Signatera for minimal residual disease.  Return to clinic in 1 year for follow-up

## 2021-09-21 NOTE — Assessment & Plan Note (Deleted)
12/06/20:Bilateral Mastectomies:  Left Mastectomy:HG DCIS 2.2 cm with necrosis, Margins Neg ER 90%, PR 40% Right Mastectomy: Grade 2 IDC with DCIS 3.8 cm, 1/3 LN ER 100%, PR 100%, Her 2 Neg, Ki 67: 2-15%  Treatment Plan: 1. Mammaprint: Luminal type a low risk 2. Adjuvant XRT completed 03/14/2021 3. Adjuvant Anti-estrogen therapy with tamoxifen to start 03/22/2021 (5 mg daily dose) ------------------------------------------------------------------------------------------------------------------- Tamoxifen toxicities: Severe hot flashes and mood swings  Breast cancer surveillance: 1.  Breast exam 09/21/2021: Benign 2. no role of imaging since she had bilateral mastectomies  Return to clinic in 1 year for follow-up

## 2021-09-26 ENCOUNTER — Telehealth: Payer: Self-pay | Admitting: Hematology and Oncology

## 2021-09-26 NOTE — Telephone Encounter (Signed)
Scheduled appointment per 8/31 los. Left voicemail. 

## 2021-10-12 ENCOUNTER — Ambulatory Visit (INDEPENDENT_AMBULATORY_CARE_PROVIDER_SITE_OTHER): Payer: 59 | Admitting: Radiology

## 2021-10-12 ENCOUNTER — Encounter: Payer: Self-pay | Admitting: Radiology

## 2021-10-12 ENCOUNTER — Other Ambulatory Visit (HOSPITAL_COMMUNITY)
Admission: RE | Admit: 2021-10-12 | Discharge: 2021-10-12 | Disposition: A | Discharge status: Home / Self Care | Payer: 59 | Source: Ambulatory Visit | Attending: Radiology | Admitting: Radiology

## 2021-10-12 VITALS — BP 108/70 | Ht 67.0 in | Wt 141.0 lb

## 2021-10-12 DIAGNOSIS — F419 Anxiety disorder, unspecified: Secondary | ICD-10-CM

## 2021-10-12 DIAGNOSIS — Z01419 Encounter for gynecological examination (general) (routine) without abnormal findings: Secondary | ICD-10-CM

## 2021-10-12 DIAGNOSIS — Z170 Estrogen receptor positive status [ER+]: Secondary | ICD-10-CM

## 2021-10-12 DIAGNOSIS — C50912 Malignant neoplasm of unspecified site of left female breast: Secondary | ICD-10-CM

## 2021-10-12 DIAGNOSIS — C50911 Malignant neoplasm of unspecified site of right female breast: Secondary | ICD-10-CM

## 2021-10-12 MED ORDER — ALPRAZOLAM 0.25 MG PO TABS: 0.2500 mg | ORAL_TABLET | Freq: Two times a day (BID) | ORAL | 0 refills | Status: DC | PRN

## 2021-10-12 NOTE — Progress Notes (Signed)
Kaitlin [name] 1975/08/04 [id number]   History:  46 y.o. G2P2 s/p bilateral breast cancer with bilateral mastectomy, radiation and reconstruction presents for annual exam. No gyn concerns. Requests refill on alprazolam to use prn. Doing better on tamoxifen, less pelvic pain.  Gynecologic History Patient's last menstrual period was 09/17/2021 (exact date). Period Cycle (Days): 28 Period Duration (Days): 5 Period Pattern: Regular Menstrual Flow: Moderate Menstrual Control: Tampon (menstural cup) Dysmenorrhea: (!) Mild Dysmenorrhea Symptoms: Cramping Contraception/Family planning: vasectomy Sexually active: not currently Last Pap: 2022. Results were: normal   Obstetric History OB History  Gravida Para Term Preterm AB Living  '2 2 2 '$ 0 0 2  SAB IAB Ectopic Multiple Live Births  0 0 0 0 2    # Outcome Date GA Lbr Len/2nd Weight Sex Delivery Anes PTL Lv  2 Term 03/21/12 [id number]:47 / 00:10 7 lb 11.5 oz (3.501 kg) M Vag-Spont EPI  LIV  1 Term 11/2008 [id number] lb 3 oz (3.26 kg) M Vag-Spont EPI Y LIV     Birth Comments: PTL, on procardia from 3465wksil delivery     The following portions of the patient's history were reviewed and updated as appropriate: allergies, current medications, past family history, past medical history, past social history, past surgical history, and problem list.  Review of Systems Pertinent items noted in HPI and remainder of comprehensive ROS otherwise negative.   Past medical history, past surgical history, family history and social history were all reviewed and documented in the EPIC chart.   Exam:  Vitals:   10/12/21 0858  BP: 108/70  Weight: 141 lb (64 kg)  Height: '5\' 7"'$  (1.702 m)   Body mass index is 22.08 kg/m.  General appearance:  Normal Thyroid:  Symmetrical, normal in size, without palpable masses or nodularity. Respiratory  Auscultation:  Clear without wheezing or rhonchi Cardiovascular  Auscultation:  Regular rate, without  rubs, murmurs or gallops  Edema/varicosities:  Not grossly evident Abdominal  Soft,nontender, without masses, guarding or rebound.  Liver/spleen:  No organomegaly noted  Hernia:  None appreciated  Skin  Inspection:  Grossly normal Breasts: Bilateral silicone implants, surgical sites healing well  Right: Without masses, retractions, nipple discharge or axillary adenopathy.   Left: Without masses, retractions, nipple discharge or axillary adenopathy. Genitourinary   Inguinal/mons:  Normal without inguinal adenopathy  External genitalia:  Normal appearing vulva with no masses, tenderness, or lesions  BUS/Urethra/Skene's glands:  Normal without masses or exudate  Vagina:  Normal appearing with normal color and discharge, no lesions  Cervix:  Normal appearing without discharge or lesions  Uterus:  Normal in size, shape and contour.  Mobile, nontender  Adnexa/parametria:     Rt: Normal in size, without masses or tenderness.   Lt: Normal in size, without masses or tenderness.  Anus and perineum: Normal   Patient informed chaperone available to be present for breast and pelvic exam. Patient has requested no chaperone to be present. Patient has been advised what will be completed during breast and pelvic exam.   Assessment/Plan:   1. Well woman exam with routine gynecological exam Hx of CIN II, elects for yearly pap - Cytology - PAP( [place])  2. Bilateral malignant neoplasm of breast in female, estrogen receptor positive, unspecified site of breast (HCFowlerSilicone implants in place x 5 weeks, doing well  3. Anxiety  - ALPRAZolam (XANAX) 0.25 MG tablet; Take 1 tablet (0.25 mg total) by mouth 2 (two) times daily as needed for anxiety.  Dispense: 30 tablet; Refill: 0    Discussed SBE, colonoscopy and DEXA screening as directed/appropriate. Recommend 172mns of exercise weekly, including weight bearing exercise. Encouraged the use of seatbelts and sunscreen. Return in 1 year for annual  or as needed.   CRubbie BattiestB WHNP-BC 9:44 AM 10/12/2021

## 2021-10-14 LAB — CYTOLOGY - PAP
Comment: NEGATIVE
Diagnosis: NEGATIVE
High risk HPV: NEGATIVE

## 2021-10-18 ENCOUNTER — Encounter: Payer: Self-pay | Admitting: Hematology and Oncology

## 2021-10-18 ENCOUNTER — Ambulatory Visit: Payer: 59 | Attending: Adult Health | Admitting: Rehabilitation

## 2021-10-18 ENCOUNTER — Encounter: Payer: Self-pay | Admitting: Rehabilitation

## 2021-10-18 DIAGNOSIS — C50911 Malignant neoplasm of unspecified site of right female breast: Secondary | ICD-10-CM | POA: Insufficient documentation

## 2021-10-18 DIAGNOSIS — C50912 Malignant neoplasm of unspecified site of left female breast: Secondary | ICD-10-CM | POA: Insufficient documentation

## 2021-10-18 DIAGNOSIS — Z170 Estrogen receptor positive status [ER+]: Secondary | ICD-10-CM | POA: Insufficient documentation

## 2021-10-18 NOTE — Therapy (Signed)
OUTPATIENT PHYSICAL THERAPY SOZO SCREENING NOTE   Patient Name: Kaitlin [name] MRN: [id number] DOB:Aug 15, 1975, 46 y.o., female Today's Date: 10/18/2021  PCP: [name], MD REFERRING PROVIDER: Wilber Bihari [place]*   PT End of Session - 10/18/21 0759     Visit Number 5   screen   PT Start Time 0800    PT Stop Time 0806    PT Time Calculation (min) 6 min             Past Medical History:  Diagnosis Date   Allergy    Anemia    Asthma    Cardiomyopathy ([place])    Chronic headaches    Delivery normal 11/2008   Family history of breast cancer    Family history of colon cancer    Family history of leukemia    Family history of melanoma    Family history of ovarian cancer    Heart murmur    Kidney stone    x of   PONV (postoperative nausea and vomiting)    S/P tonsillectomy    2000   Past Surgical History:  Procedure Laterality Date   BREAST RECONSTRUCTION WITH PLACEMENT OF TISSUE EXPANDER AND ALLODERM Bilateral 12/06/2020   Procedure: BREAST RECONSTRUCTION WITH PLACEMENT OF TISSUE EXPANDER AND ALLODERM;  Surgeon: Irene Limbo, MD;  Location: [facility];  Service: Plastics;  Laterality: Bilateral;   LEEP     MASTECTOMY W/ SENTINEL NODE BIOPSY Right 12/06/2020   Procedure: RIGHT MASTECTOMY WITH RIGHT AXILLARY SENTINEL LYMPH NODE BIOPSY;  Surgeon: Rolm Bookbinder, MD;  Location: [facility];  Service: General;  Laterality: Right;   REMOVAL OF BILATERAL TISSUE EXPANDERS WITH PLACEMENT OF BILATERAL BREAST IMPLANTS Bilateral 09/15/2021   Procedure: REMOVAL OF BILATERAL TISSUE EXPANDERS WITH PLACEMENT OF BILATERAL BREAST IMPLANTS;  Surgeon: Irene Limbo, MD;  Location: [facility];  Service: Plastics;  Laterality: Bilateral;   tonsil removal  01/22/1998   TOTAL MASTECTOMY Left 12/06/2020   Procedure: LEFT TOTAL MASTECTOMY;  Surgeon: Rolm Bookbinder, MD;  Location: [facility];  Service: General;   Laterality: Left;   Patient Active Problem List   Diagnosis Date Noted   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   Malignant neoplasm of upper-outer quadrant of right breast in female, estrogen receptor positive ([place]) 12/12/2020   S/P bilateral mastectomy 12/06/2020   Genetic testing 11/17/2020   Family history of breast cancer 11/02/2020   Family history of ovarian cancer 11/02/2020   Family history of colon cancer 11/02/2020   Family history of melanoma 11/02/2020   Family history of leukemia 11/02/2020   Bilateral breast cancer ([place]) 11/01/2020   LLQ abdominal pain 04/06/2011    REFERRING DIAG: right breast cancer at risk for lymphedema  THERAPY DIAG:  Malignant neoplasm of right breast in female, estrogen receptor positive, unspecified site of breast ([place])  Malignant neoplasm of left female breast, unspecified estrogen receptor status, unspecified site of breast ([place])  PERTINENT HISTORY: Bil mastectomy with immediate reconstruction 12/06/20 with 1/3 LN positive.  Rt Grade 2 IDC with DCIS ER positive.  Lt breast DCIS intermediate, no LN. Radiation 01/27/21-03/14/2021 to Right chest. Will have surgery this summer for expander exchange  PRECAUTIONS: right UE Lymphedema risk  SUBJECTIVE: Doing well  PAIN:  Are you having pain? No  SOZO SCREENING: Patient was assessed today using the SOZO machine to determine the lymphedema index score. This was compared to her baseline score. It was determined that she is  within the recommended range when compared to her baseline and no further action is needed at this time. She will continue SOZO screenings. These are done every 3 months for 2 years post operatively followed by every 6 months for 2 years, and then annually.     Stark Bray, PT 10/18/2021, 8:06 AM

## 2021-10-26 ENCOUNTER — Ambulatory Visit (HOSPITAL_COMMUNITY)
Admission: RE | Admit: 2021-10-26 | Discharge: 2021-10-26 | Disposition: A | Discharge status: Home / Self Care | Payer: 59 | Source: Ambulatory Visit | Attending: Hematology and Oncology | Admitting: Hematology and Oncology

## 2021-10-26 DIAGNOSIS — R911 Solitary pulmonary nodule: Secondary | ICD-10-CM | POA: Diagnosis present

## 2021-10-27 ENCOUNTER — Telehealth: Payer: Self-pay | Admitting: Hematology and Oncology

## 2021-10-27 LAB — SIGNATERA ONLY (NATERA MANAGED)
SIGNATERA MTM READOUT: 0 MTM/ml
SIGNATERA TEST RESULT: NEGATIVE

## 2021-10-27 NOTE — Telephone Encounter (Signed)
I informed the patient that the CT chest showed that the 9 mm lung nodule has resolved and the tiny bilateral lower lobe nodules appear to be unchanged. Radiologist recommended another 12 to 48-monthfollow-up CT scan if she was high risk.  We can discuss this when we meet for her next appointment.

## 2021-10-30 ENCOUNTER — Telehealth: Payer: Self-pay

## 2021-10-30 ENCOUNTER — Other Ambulatory Visit: Payer: Self-pay | Admitting: Adult Health

## 2021-10-30 DIAGNOSIS — R911 Solitary pulmonary nodule: Secondary | ICD-10-CM

## 2021-10-30 NOTE — Progress Notes (Signed)
Patient's lung nodules are stable however repeat CT chest is recommended due to her history of breast cancer in 1 years time.  Orders placed.  I attempted to call the patient and left a message on her voicemail.  Wilber Bihari, NP 10/30/21 3:28 PM Medical Oncology and Hematology [facility] [place], [place] [address] Tel. [phone]    Fax. [phone]

## 2021-10-30 NOTE — Telephone Encounter (Signed)
Called and spoke with pt, informed her of negative signatera test results.  Pt verbalized understanding.

## 2021-11-01 ENCOUNTER — Encounter (HOSPITAL_COMMUNITY): Payer: Self-pay

## 2021-11-05 ENCOUNTER — Encounter: Payer: Self-pay | Admitting: Adult Health

## 2021-11-06 ENCOUNTER — Encounter: Payer: Self-pay | Admitting: Hematology and Oncology

## 2021-11-30 ENCOUNTER — Encounter: Payer: Self-pay | Admitting: Hematology and Oncology

## 2021-11-30 ENCOUNTER — Other Ambulatory Visit: Payer: Self-pay | Admitting: *Deleted

## 2021-11-30 DIAGNOSIS — M5430 Sciatica, unspecified side: Secondary | ICD-10-CM

## 2021-11-30 DIAGNOSIS — D0512 Intraductal carcinoma in situ of left breast: Secondary | ICD-10-CM

## 2021-11-30 NOTE — Progress Notes (Signed)
Received message from pt requesting referral be placed for physical therapy for the treatment of Sciatica with possible dry needling due to increased joint pain on Tamoxifen.  Verbal orders placed per MD request.

## 2021-12-01 ENCOUNTER — Other Ambulatory Visit: Payer: Self-pay

## 2021-12-01 ENCOUNTER — Encounter: Payer: Self-pay | Admitting: Physical Therapy

## 2021-12-01 ENCOUNTER — Ambulatory Visit: Payer: 59 | Attending: Hematology and Oncology | Admitting: Physical Therapy

## 2021-12-01 DIAGNOSIS — M5430 Sciatica, unspecified side: Secondary | ICD-10-CM | POA: Diagnosis present

## 2021-12-01 DIAGNOSIS — M6281 Muscle weakness (generalized): Secondary | ICD-10-CM | POA: Diagnosis not present

## 2021-12-01 DIAGNOSIS — M5431 Sciatica, right side: Secondary | ICD-10-CM

## 2021-12-01 DIAGNOSIS — D0512 Intraductal carcinoma in situ of left breast: Secondary | ICD-10-CM | POA: Insufficient documentation

## 2021-12-01 NOTE — Therapy (Signed)
OUTPATIENT PHYSICAL THERAPY THORACOLUMBAR EVALUATION   Patient Name: Kaitlin [name] MRN: [id number] DOB:[birth year], 46 y.o., female Today's Date: 12/01/2021   PT End of Session - 12/01/21 1050     Visit Number 1    Number of Visits 15    Date for PT Re-Evaluation 01/26/22    Authorization Type 20 visit limit 5 used    PT Start Time 1100    PT Stop Time 1145    PT Time Calculation (min) 45 min    Activity Tolerance Patient tolerated treatment well             Past Medical History:  Diagnosis Date   Allergy    Anemia    Asthma    Cardiomyopathy ([place])    Chronic headaches    Delivery normal 11/2008   Family history of breast cancer    Family history of colon cancer    Family history of leukemia    Family history of melanoma    Family history of ovarian cancer    Heart murmur    Kidney stone    x of   PONV (postoperative nausea and vomiting)    S/P tonsillectomy    2000   Past Surgical History:  Procedure Laterality Date   BREAST RECONSTRUCTION WITH PLACEMENT OF TISSUE EXPANDER AND ALLODERM Bilateral 12/06/2020   Procedure: BREAST RECONSTRUCTION WITH PLACEMENT OF TISSUE EXPANDER AND ALLODERM;  Surgeon: Irene Limbo, MD;  Location: [facility];  Service: Plastics;  Laterality: Bilateral;   LEEP     MASTECTOMY W/ SENTINEL NODE BIOPSY Right 12/06/2020   Procedure: RIGHT MASTECTOMY WITH RIGHT AXILLARY SENTINEL LYMPH NODE BIOPSY;  Surgeon: Rolm Bookbinder, MD;  Location: [facility];  Service: General;  Laterality: Right;   REMOVAL OF BILATERAL TISSUE EXPANDERS WITH PLACEMENT OF BILATERAL BREAST IMPLANTS Bilateral 09/15/2021   Procedure: REMOVAL OF BILATERAL TISSUE EXPANDERS WITH PLACEMENT OF BILATERAL BREAST IMPLANTS;  Surgeon: Irene Limbo, MD;  Location: [facility];  Service: Plastics;  Laterality: Bilateral;   tonsil removal  01/22/1998   TOTAL MASTECTOMY Left 12/06/2020   Procedure: LEFT TOTAL MASTECTOMY;   Surgeon: Rolm Bookbinder, MD;  Location: [facility];  Service: General;  Laterality: Left;   Patient Active Problem List   Diagnosis Date Noted   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   Malignant neoplasm of upper-outer quadrant of right breast in female, estrogen receptor positive ([place]) 12/12/2020   S/P bilateral mastectomy 12/06/2020   Genetic testing 11/17/2020   Family history of breast cancer 11/02/2020   Family history of ovarian cancer 11/02/2020   Family history of colon cancer 11/02/2020   Family history of melanoma 11/02/2020   Family history of leukemia 11/02/2020   Bilateral breast cancer ([place]) 11/01/2020   LLQ abdominal pain 04/06/2011    PCP: [name] MD  REFERRING PROVIDER: [name] MD  REFERRING DIAG: M54.30 sciatica;  D05.12 ductal carninoma in situ of left breast  Rationale for Evaluation and Treatment: Rehabilitation  THERAPY DIAG: sciatica  ONSET DATE: 10/31/21  SUBJECTIVE:  SUBJECTIVE STATEMENT:   Started 1 week ago but also had some hip pain last month.  Right low back pain, buttock to post mid thigh, travels around to right hip; no symptoms below the knee;  no numbness/tingling.  Coincides with period.   Too tender to lie prone b/c of implant/recent surgery.   PERTINENT HISTORY:  Bil mastectomy with immediate reconstruction 12/06/20 with 1/3 LN positive.  Rt Grade 2 IDC with DCIS ER positive.  Lt breast DCIS intermediate, no LN. Radiation 01/27/21-03/14/2021 to Right chest.  Implants 09/15/21  Unable to lie prone secondary pain from recent breast surgery PAIN:  Are you having pain? Yes NPRS scale: 3-4/10 Pain location:  Right low back pain, buttock to post mid thigh constant  Aggravating factors: period; sitting in car;  worse  at night; standing Relieving factors: Tiger Balm  PRECAUTIONS: Other: no heat near left breast  WEIGHT BEARING RESTRICTIONS: No  FALLS:  Has patient fallen in last 6 months? 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)  PLOF: Independent  PATIENT GOALS: alleviate pain (I know that Tamoxifen could be causing joint pain)     OBJECTIVE:   DIAGNOSTIC FINDINGS:  CT chest in May and August  PATIENT SURVEYS:  FOTO 67%    COGNITION: Overall cognitive status: Within functional limits for tasks assessed      POSTURE: No Significant postural limitations  PALPATION: Numerous tender points in gluteals, restriction in QL  LUMBAR ROM:   Repeated movement testing indicates a possible extension preference however unable to lie prone  AROM eval  Flexion Fingertips to toes  Extension 15  Right lateral flexion 35  Left lateral flexion 35  Right rotation   Left rotation    (Blank rows = not tested)   LOWER EXTREMITY ROM:   WFLs  LOWER EXTREMITY MMT:  Decreased right hip abduction strength 4/5  LUMBAR SPECIAL TESTS:  Slump test: Negative  patient states she thinks this would have been painful to do yesterday  GAIT: Comments: normal  TODAY'S TREATMENT:                                                                                                                              DATE: 11/10  Manual therapy: soft tissue mobilization to gluteals and QL  Trigger Point Dry-Needling  Treatment instructions: Expect mild to moderate muscle soreness. S/S of pneumothorax if dry needled over a lung field, and to seek immediate medical attention should they occur. Patient verbalized understanding of these instructions and education.  Patient Consent Given: Yes Education handout provided: Yes Muscles treated: right gluteals, right QL in sidelying Electrical stimulation performed: No Parameters: N/A Treatment response/outcome: improved soft tissue length and  decreased tender point size and number;  pt states, "it already feels better."   PATIENT EDUCATION:  Education details: plan of care;  DN after care handout Person educated: Patient Education method: Explanation Education comprehension: verbalized understanding  HOME EXERCISE PROGRAM: Standing extensions frequently; use of lumbar roll when sitting; avoid overstretching sciatic nerve  ASSESSMENT:  CLINICAL IMPRESSION: Patient is a 46 y.o. female who was seen today for physical therapy evaluation and treatment for  right sciatica. The patient would benefit from PT to myofascial restrictions in lumbar and hip musculature, neural symptoms and strength asymmetries in lumbo/pelvic/ hip regions that are currently affecting activities of daily living at home and work including sitting, standing, sleeping, performing hobbies and recreational activities.     OBJECTIVE IMPAIRMENTS: decreased activity tolerance, decreased ROM, decreased strength, increased fascial restrictions, impaired perceived functional ability, and pain.   ACTIVITY LIMITATIONS: sitting and standing  PARTICIPATION LIMITATIONS: driving, community activity, and occupation  PERSONAL FACTORS: 1 comorbidity: recent breast surgery for CA  are also affecting patient's functional outcome.   REHAB POTENTIAL: Good  CLINICAL DECISION MAKING: Stable/uncomplicated  EVALUATION COMPLEXITY: Low   GOALS: Goals reviewed with patient? Yes  SHORT TERM GOALS: Target date: 12/29/2021  The patient will demonstrate knowledge of basic self care strategies and exercises to promote healing   Baseline: Goal status: INITIAL  2.  The patient will report a 40% improvement in pain levels with functional activities including sitting, driving, night time Baseline:  Goal status: INITIAL  3.  Pain centralized 50% of the time Baseline:  Goal status: INITIAL      LONG TERM GOALS: Target date: 01/26/2022  The patient will be independent in a  safe self progression of a home exercise program to promote further recovery of function   Baseline:  Goal status: INITIAL  2.  The patient will report a 75% improvement in pain levels with functional activities which are currently difficult including sitting, riding in the car at night and with her period Baseline:  Goal status: INITIAL  3.  The patient will have improved hip strength to at least 4+/5 needed for standing, walking longer distances   Baseline:  Goal status: INITIAL   4.  The patient will have improved FOTO score to   86%    indicating improved function with less pain  Baseline:  Goal status: INITIAL   PLAN:  PT FREQUENCY: 1-2x/week  PT DURATION: 8 weeks  PLANNED INTERVENTIONS: Therapeutic exercises, Therapeutic activity, Neuromuscular re-education, Gait training, Patient/Family education, Self Care, Joint mobilization, Aquatic Therapy, Dry Needling, Spinal manipulation, Spinal mobilization, Cryotherapy, Moist heat, Taping, Ionotophoresis '4mg'$ /ml Dexamethasone, Manual therapy, and Re-evaluation.  PLAN FOR NEXT SESSION: assess response to DN#1 of right QL and gluteals in sidelying;  no prone secondary to pain from recent implant surgery;  check response to standing lumbar extensions, use of lumbar roll; sciatic neural flossing; right glute strengthening  Ruben Im, PT 12/01/21 12:23 PM Phone: [phone] Fax: [phone]

## 2021-12-01 NOTE — Patient Instructions (Signed)

## 2021-12-11 ENCOUNTER — Encounter: Payer: Self-pay | Admitting: Rehabilitative and Restorative Service Providers"

## 2021-12-11 ENCOUNTER — Ambulatory Visit: Payer: 59 | Admitting: Rehabilitative and Restorative Service Providers"

## 2021-12-11 DIAGNOSIS — M5430 Sciatica, unspecified side: Secondary | ICD-10-CM | POA: Diagnosis not present

## 2021-12-11 DIAGNOSIS — M6281 Muscle weakness (generalized): Secondary | ICD-10-CM

## 2021-12-11 DIAGNOSIS — M5431 Sciatica, right side: Secondary | ICD-10-CM

## 2021-12-11 NOTE — Patient Instructions (Signed)

## 2021-12-11 NOTE — Therapy (Signed)
OUTPATIENT PHYSICAL THERAPY THORACOLUMBAR EVALUATION   Patient Name: Kaitlin [name] MRN: [id number] DOB:[birth year], 46 y.o., female Today's Date: 12/11/2021   PT End of Session - 12/11/21 0805     Visit Number 2    Date for PT Re-Evaluation 01/26/22    Authorization Type 20 visit limit 5 used    Authorization - Visit Number 2    Authorization - Number of Visits 15    PT Start Time 0800    PT Stop Time 0840    PT Time Calculation (min) 40 min    Activity Tolerance Patient tolerated treatment well    Behavior During Therapy [facility] for tasks assessed/performed             Past Medical History:  Diagnosis Date   Allergy    Anemia    Asthma    Cardiomyopathy ([place])    Chronic headaches    Delivery normal 11/2008   Family history of breast cancer    Family history of colon cancer    Family history of leukemia    Family history of melanoma    Family history of ovarian cancer    Heart murmur    Kidney stone    x of   PONV (postoperative nausea and vomiting)    S/P tonsillectomy    2000   Past Surgical History:  Procedure Laterality Date   BREAST RECONSTRUCTION WITH PLACEMENT OF TISSUE EXPANDER AND ALLODERM Bilateral 12/06/2020   Procedure: BREAST RECONSTRUCTION WITH PLACEMENT OF TISSUE EXPANDER AND ALLODERM;  Surgeon: Irene Limbo, MD;  Location: [facility];  Service: Plastics;  Laterality: Bilateral;   LEEP     MASTECTOMY W/ SENTINEL NODE BIOPSY Right 12/06/2020   Procedure: RIGHT MASTECTOMY WITH RIGHT AXILLARY SENTINEL LYMPH NODE BIOPSY;  Surgeon: Rolm Bookbinder, MD;  Location: [facility];  Service: General;  Laterality: Right;   REMOVAL OF BILATERAL TISSUE EXPANDERS WITH PLACEMENT OF BILATERAL BREAST IMPLANTS Bilateral 09/15/2021   Procedure: REMOVAL OF BILATERAL TISSUE EXPANDERS WITH PLACEMENT OF BILATERAL BREAST IMPLANTS;  Surgeon: Irene Limbo, MD;  Location: [facility];  Service: Plastics;  Laterality:  Bilateral;   tonsil removal  01/22/1998   TOTAL MASTECTOMY Left 12/06/2020   Procedure: LEFT TOTAL MASTECTOMY;  Surgeon: Rolm Bookbinder, MD;  Location: [facility];  Service: General;  Laterality: Left;   Patient Active Problem List   Diagnosis Date Noted   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   Malignant neoplasm of upper-outer quadrant of right breast in female, estrogen receptor positive ([place]) 12/12/2020   S/P bilateral mastectomy 12/06/2020   Genetic testing 11/17/2020   Family history of breast cancer 11/02/2020   Family history of ovarian cancer 11/02/2020   Family history of colon cancer 11/02/2020   Family history of melanoma 11/02/2020   Family history of leukemia 11/02/2020   Bilateral breast cancer ([place]) 11/01/2020   LLQ abdominal pain 04/06/2011    PCP: [name] MD  REFERRING PROVIDER: [name] MD  REFERRING DIAG: M54.30 sciatica;  D05.12 ductal carninoma in situ of left breast  Rationale for Evaluation and Treatment: Rehabilitation  THERAPY DIAG: sciatica  ONSET DATE: 10/31/21  SUBJECTIVE:  SUBJECTIVE STATEMENT: Pt reports that she is still sore from dry needling, but feels like she has some decrease in pain.  PERTINENT HISTORY:  Bil mastectomy with immediate reconstruction 12/06/20 with 1/3 LN positive.  Rt Grade 2 IDC with DCIS ER positive.  Lt breast DCIS intermediate, no LN. Radiation 01/27/21-03/14/2021 to Right chest.  Implants 09/15/21  Unable to lie prone secondary pain from recent breast surgery PAIN:  Are you having pain? Yes NPRS scale: 3-4/10 Pain location:  Right low back pain, buttock to post mid thigh constant  Aggravating factors: period; sitting in car;  worse at night; standing Relieving factors: Tiger Balm    PRECAUTIONS: Other: no heat near left breast  WEIGHT BEARING RESTRICTIONS: No  FALLS:  Has patient fallen in last 6 months? 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)  PLOF: Independent  PATIENT GOALS: alleviate pain (I know that Tamoxifen could be causing joint pain)     OBJECTIVE:   DIAGNOSTIC FINDINGS:  CT chest in May and August  PATIENT SURVEYS:  Eval:  FOTO 67%   COGNITION: Overall cognitive status: Within functional limits for tasks assessed      POSTURE: No Significant postural limitations  PALPATION: Numerous tender points in gluteals, restriction in QL  LUMBAR ROM:   Repeated movement testing indicates a possible extension preference however unable to lie prone  AROM eval  Flexion Fingertips to toes  Extension 15  Right lateral flexion 35  Left lateral flexion 35  Right rotation   Left rotation    (Blank rows = not tested)   LOWER EXTREMITY ROM:   WFLs  LOWER EXTREMITY MMT:  Decreased right hip abduction strength 4/5  LUMBAR SPECIAL TESTS:  Slump test: Negative  patient states she thinks this would have been painful to do yesterday  GAIT: Comments: normal  TODAY'S TREATMENT:                                                                                                                               DATE: 12/11/2021 Nustep LE only Level 3 x6 minutes with PT present to discuss status Seated piriformis stretch 2x20 sec bilat Seated hamstring stretch 2x20 sec bilat Seated sciatic nerve tensioner 2x10 bilat Supine posterior pelvic tilt 2x10 Supine lower trunk rotation 5x10 sec Supine single knee to chest 2x20 sec bilat Left Sidelying clamshell 2x10 Trigger Point Dry-Needling  Treatment instructions: Expect mild to moderate muscle soreness. S/S of pneumothorax if dry needled over a lung field, and to seek immediate medical attention should they occur. Patient verbalized understanding of these instructions and  education. Patient Consent Given: Yes Education handout provided: Yes Muscles treated: right glute/piriformis, right lumbar multifidi Electrical stimulation performed: No Parameters: N/A Treatment response/outcome: Utilized skilled palpation to identify trigger points.  Able to illicit twitch response and muscle elongation following. Manual Therapy:  Soft tissue mobilization to lumbar and glute/piriformis region    DATE: 11/10  Manual therapy: soft tissue mobilization to gluteals and QL  Trigger Point Dry-Needling  Treatment instructions: Expect mild to moderate muscle soreness. S/S of pneumothorax if dry needled over a lung field, and to seek immediate medical attention should they occur. Patient verbalized understanding of these instructions and education.  Patient Consent Given: Yes Education handout provided: Yes Muscles treated: right gluteals, right QL in sidelying Electrical stimulation performed: No Parameters: N/A Treatment response/outcome: improved soft tissue length and decreased tender point size and number;  pt states, "it already feels better."   PATIENT EDUCATION:  Education details: plan of care;  DN after care handout Person educated: Patient Education method: Explanation Education comprehension: verbalized understanding  HOME EXERCISE PROGRAM: Access Code: [email or web address] URL: [email or web address] Date: 12/11/2021 Prepared by: Shelby Dubin Kaiyu Mirabal  Exercises - Standing Lumbar Extension  - 1 x daily - 7 x weekly - 2 sets - 10 reps - Seated Piriformis Stretch  - 1 x daily - 7 x weekly - 2 sets - 2 reps - 20 sec hold - Seated Hamstring Stretch  - 1 x daily - 7 x weekly - 1 sets - 2 reps - 20 sec hold - Seated Sciatic Tensioner  - 1 x daily - 7 x weekly - 2 sets - 10 reps - Hooklying Single Knee to Chest Stretch  - 1 x daily - 7 x weekly - 1 sets - 2 reps - 20 sec hold - Supine Posterior Pelvic Tilt  - 1 x daily - 7 x weekly - 2 sets - 10 reps - Supine Lower  Trunk Rotation  - 1 x daily - 7 x weekly - 2 sets - 5 reps - 10 sec hold - Clamshell  - 1 x daily - 7 x weekly - 2 sets - 10 reps  ASSESSMENT:  CLINICAL IMPRESSION: Patient presents to skilled PT reporting that she was feeling better following first dry needling, but did still have some soreness.  Pt able to progress with exercises during session with minimal cuing for technique.  Pt with good response to dry needling with lumbar multifidi and piriformis.  Pt provided with updated HEP and able to return demonstration and verbalize understanding.  Pt continues to require skilled PT to progress towards goal related activities.  OBJECTIVE IMPAIRMENTS: decreased activity tolerance, decreased ROM, decreased strength, increased fascial restrictions, impaired perceived functional ability, and pain.   ACTIVITY LIMITATIONS: sitting and standing  PARTICIPATION LIMITATIONS: driving, community activity, and occupation  PERSONAL FACTORS: 1 comorbidity: recent breast surgery for CA  are also affecting patient's functional outcome.   REHAB POTENTIAL: Good  CLINICAL DECISION MAKING: Stable/uncomplicated  EVALUATION COMPLEXITY: Low   GOALS: Goals reviewed with patient? Yes  SHORT TERM GOALS: Target date: 12/29/2021  The patient will demonstrate knowledge of basic self care strategies and exercises to promote healing   Baseline: Goal status: IN PROGRESS  2.  The patient will report a 40% improvement in pain levels with functional activities including sitting, driving, night time Baseline:  Goal status: INITIAL  3.  Pain centralized 50% of the time Baseline:  Goal status: INITIAL      LONG TERM GOALS: Target date: 01/26/2022  The patient will be independent in a safe self progression of a home exercise program to promote further recovery of function   Baseline:  Goal status: INITIAL  2.  The patient will report a 75% improvement in pain levels with functional activities which are  currently difficult including sitting, riding in the car at night and with  her period Baseline:  Goal status: INITIAL  3.  The patient will have improved hip strength to at least 4+/5 needed for standing, walking longer distances   Baseline:  Goal status: INITIAL   4.  The patient will have improved FOTO score to   86%    indicating improved function with less pain  Baseline:  Goal status: INITIAL   PLAN:  PT FREQUENCY: 1-2x/week  PT DURATION: 8 weeks  PLANNED INTERVENTIONS: Therapeutic exercises, Therapeutic activity, Neuromuscular re-education, Gait training, Patient/Family education, Self Care, Joint mobilization, Aquatic Therapy, Dry Needling, Spinal manipulation, Spinal mobilization, Cryotherapy, Moist heat, Taping, Ionotophoresis '4mg'$ /ml Dexamethasone, Manual therapy, and Re-evaluation.  PLAN FOR NEXT SESSION: assess response to DN#1 of right QL and gluteals in sidelying;  no prone secondary to pain from recent implant surgery;  check response to standing lumbar extensions, use of lumbar roll; sciatic neural flossing; right glute strengthening   Juel Burrow, PT 12/11/21 9:02 AM  [facility] [address], [place], [place] [address] Phone # [phone] Fax [phone]

## 2021-12-19 ENCOUNTER — Ambulatory Visit: Payer: 59 | Admitting: Physical Therapy

## 2021-12-19 DIAGNOSIS — M5431 Sciatica, right side: Secondary | ICD-10-CM

## 2021-12-19 DIAGNOSIS — M5430 Sciatica, unspecified side: Secondary | ICD-10-CM | POA: Diagnosis not present

## 2021-12-19 DIAGNOSIS — M6281 Muscle weakness (generalized): Secondary | ICD-10-CM

## 2021-12-19 NOTE — Therapy (Signed)
OUTPATIENT PHYSICAL THERAPY THORACOLUMBAR PROGRESS NOTE   Patient Name: Kaitlin [name] MRN: [id number] DOB:1975/04/20, 46 y.o., female Today's Date: 12/19/2021   PT End of Session - 12/19/21 1229     Visit Number 3    Number of Visits 15    Date for PT Re-Evaluation 01/26/22    Authorization Type 20 visit limit 5 used    Authorization - Visit Number 3    Authorization - Number of Visits 15    PT Start Time 8657    PT Stop Time 1310    PT Time Calculation (min) 40 min    Activity Tolerance Patient tolerated treatment well             Past Medical History:  Diagnosis Date   Allergy    Anemia    Asthma    Cardiomyopathy ([place])    Chronic headaches    Delivery normal 11/2008   Family history of breast cancer    Family history of colon cancer    Family history of leukemia    Family history of melanoma    Family history of ovarian cancer    Heart murmur    Kidney stone    x of   PONV (postoperative nausea and vomiting)    S/P tonsillectomy    2000   Past Surgical History:  Procedure Laterality Date   BREAST RECONSTRUCTION WITH PLACEMENT OF TISSUE EXPANDER AND ALLODERM Bilateral 12/06/2020   Procedure: BREAST RECONSTRUCTION WITH PLACEMENT OF TISSUE EXPANDER AND ALLODERM;  Surgeon: Irene Limbo, MD;  Location: [place];  Service: Plastics;  Laterality: Bilateral;   LEEP     MASTECTOMY W/ SENTINEL NODE BIOPSY Right 12/06/2020   Procedure: RIGHT MASTECTOMY WITH RIGHT AXILLARY SENTINEL LYMPH NODE BIOPSY;  Surgeon: Rolm Bookbinder, MD;  Location: [place];  Service: General;  Laterality: Right;   REMOVAL OF BILATERAL TISSUE EXPANDERS WITH PLACEMENT OF BILATERAL BREAST IMPLANTS Bilateral 09/15/2021   Procedure: REMOVAL OF BILATERAL TISSUE EXPANDERS WITH PLACEMENT OF BILATERAL BREAST IMPLANTS;  Surgeon: Irene Limbo, MD;  Location: [place];  Service: Plastics;  Laterality: Bilateral;   tonsil removal   01/22/1998   TOTAL MASTECTOMY Left 12/06/2020   Procedure: LEFT TOTAL MASTECTOMY;  Surgeon: Rolm Bookbinder, MD;  Location: [place];  Service: General;  Laterality: Left;   Patient Active Problem List   Diagnosis Date Noted   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   Malignant neoplasm of upper-outer quadrant of right breast in female, estrogen receptor positive ([place]) 12/12/2020   S/P bilateral mastectomy 12/06/2020   Genetic testing 11/17/2020   Family history of breast cancer 11/02/2020   Family history of ovarian cancer 11/02/2020   Family history of colon cancer 11/02/2020   Family history of melanoma 11/02/2020   Family history of leukemia 11/02/2020   Bilateral breast cancer ([place]) 11/01/2020   LLQ abdominal pain 04/06/2011    PCP: [name] MD  REFERRING PROVIDER: [name] MD  REFERRING DIAG: M54.30 sciatica;  D05.12 ductal carninoma in situ of left breast  Rationale for Evaluation and Treatment: Rehabilitation  THERAPY DIAG: sciatica  ONSET DATE: 10/31/21  SUBJECTIVE:  SUBJECTIVE STATEMENT: Horrible until yesterday.  Did stretches twice yesterday and a weight bearing ex.  Felt a release last night.  Flares with sitting on soft surface without back support.  Hip flexors feel tight.  DN was great when she hit the piriformis last time.   PERTINENT HISTORY:  Bil mastectomy with immediate reconstruction 12/06/20 with 1/3 LN positive.  Rt Grade 2 IDC with DCIS ER positive.  Lt breast DCIS intermediate, no LN. Radiation 01/27/21-03/14/2021 to Right chest.  Implants 09/15/21  Unable to lie prone secondary pain from recent breast surgery PAIN:  Are you having pain? Yes it was a 9/10 yesterday NPRS scale: 1-2/10 Pain location:  Right low back pain,  buttock to post mid thigh constant  Aggravating factors: period; sitting in car;  worse at night; standing Relieving factors: Tiger Balm   PRECAUTIONS: Other: no heat near left breast  WEIGHT BEARING RESTRICTIONS: No  FALLS:  Has patient fallen in last 6 months? 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)  PLOF: Independent  PATIENT GOALS: alleviate pain (I know that Tamoxifen could be causing joint pain)     OBJECTIVE:   DIAGNOSTIC FINDINGS:  CT chest in May and August  PATIENT SURVEYS:  Eval:  FOTO 67%   COGNITION: Overall cognitive status: Within functional limits for tasks assessed      POSTURE: No Significant postural limitations  PALPATION: Numerous tender points in gluteals, restriction in QL  LUMBAR ROM:   Repeated movement testing indicates a possible extension preference however unable to lie prone  AROM eval  Flexion Fingertips to toes  Extension 15  Right lateral flexion 35  Left lateral flexion 35  Right rotation   Left rotation    (Blank rows = not tested)   LOWER EXTREMITY ROM:   WFLs  LOWER EXTREMITY MMT:  Decreased right hip abduction strength 4/5  LUMBAR SPECIAL TESTS:  Slump test: Negative  patient states she thinks this would have been painful to do yesterday  GAIT: Comments: normal  TODAY'S TREATMENT:        DATE: 12/19/2021 Left Sidelying clamshell with green band  x10 Reverse clams 10x Sidelying hip abduction 10x (difficult) Seated neural floss 10x right/left Standing hip abduction against ball on wall 10x right/left  Trigger Point Dry-Needling  Treatment instructions: Expect mild to moderate muscle soreness. S/S of pneumothorax if dry needled over a lung field, and to seek immediate medical attention should they occur. Patient verbalized understanding of these instructions and education. Patient Consent Given: Yes Education handout provided: Yes Muscles treated: right glute/piriformis in  sidelying Electrical stimulation performed: No Parameters: N/A Treatment response/outcome: Utilized skilled palpation to identify trigger points.  Able to illicit twitch response and muscle elongation following. Manual Therapy:  Soft tissue mobilization to lumbar and glute/piriformis region  DATE: 12/11/2021 Nustep LE only Level 3 x6 minutes with PT present to discuss status Seated piriformis stretch 2x20 sec bilat Seated hamstring stretch 2x20 sec bilat Seated sciatic nerve tensioner 2x10 bilat Supine posterior pelvic tilt 2x10 Supine lower trunk rotation 5x10 sec Supine single knee to chest 2x20 sec bilat Left Sidelying clamshell 2x10 Trigger Point Dry-Needling  Treatment instructions: Expect mild to moderate muscle soreness. S/S of pneumothorax if dry needled over a lung field, and to seek immediate medical attention should they occur. Patient verbalized understanding of these instructions and education. Patient Consent Given: Yes Education handout provided: Yes Muscles treated: right glute/piriformis, right lumbar multifidi Electrical stimulation performed: No Parameters: N/A Treatment response/outcome: Utilized skilled palpation to identify trigger points.  Able to illicit twitch response and muscle elongation following. Manual Therapy:  Soft tissue mobilization to lumbar and glute/piriformis region    DATE: 11/10  Manual therapy: soft tissue mobilization to gluteals and QL  Trigger Point Dry-Needling  Treatment instructions: Expect mild to moderate muscle soreness. S/S of pneumothorax if dry needled over a lung field, and to seek immediate medical attention should they occur. Patient verbalized understanding of these instructions and education.  Patient Consent Given: Yes Education handout provided: Yes Muscles treated: right gluteals, right QL in  sidelying Electrical stimulation performed: No Parameters: N/A Treatment response/outcome: improved soft tissue length and decreased tender point size and number;  pt states, "it already feels better."   PATIENT EDUCATION:  Education details: plan of care;  DN after care handout Person educated: Patient Education method: Explanation Education comprehension: verbalized understanding  HOME EXERCISE PROGRAM:  Access Code: [email or web address] URL: [email or web address] Date: 12/19/2021 Prepared by: Ruben Im  Exercises - Standing Lumbar Extension  - 1 x daily - 7 x weekly - 2 sets - 10 reps - Seated Piriformis Stretch  - 1 x daily - 7 x weekly - 2 sets - 2 reps - 20 sec hold - Seated Hamstring Stretch  - 1 x daily - 7 x weekly - 1 sets - 2 reps - 20 sec hold - Seated Sciatic Tensioner  - 1 x daily - 7 x weekly - 2 sets - 10 reps - Hooklying Single Knee to Chest Stretch  - 1 x daily - 7 x weekly - 1 sets - 2 reps - 20 sec hold - Supine Posterior Pelvic Tilt  - 1 x daily - 7 x weekly - 2 sets - 10 reps - Supine Lower Trunk Rotation  - 1 x daily - 7 x weekly - 2 sets - 5 reps - 10 sec hold - Clamshell  - 1 x daily - 7 x weekly - 2 sets - 10 reps - Seated Slump Nerve Glide  - 1 x daily - 7 x weekly - 1 sets - 10 reps - Clam with Resistance  - 1 x daily - 7 x weekly - 1 sets - 10 reps - Beginner Reverse Clamshell  - 1 x daily - 7 x weekly - 1 sets - 10 reps - Sidelying Hip Abduction  - 1 x daily - 7 x weekly - 1 sets - 10 reps - Standing Isometric Hip Abduction with Ball on Wall  - 1 x daily - 7 x weekly - 1 sets - 10 reps Access Code: [email or web address] URL: [email or web address] Date: 12/11/2021 Prepared by: Juel Burrow  Exercises - Standing Lumbar Extension  - 1 x daily - 7 x weekly - 2 sets - 10 reps - Seated Piriformis Stretch  - 1 x  daily - 7 x weekly - 2 sets - 2 reps - 20 sec hold - Seated Hamstring Stretch  - 1 x daily - 7 x weekly - 1 sets - 2 reps - 20 sec  hold - Seated Sciatic Tensioner  - 1 x daily - 7 x weekly - 2 sets - 10 reps - Hooklying Single Knee to Chest Stretch  - 1 x daily - 7 x weekly - 1 sets - 2 reps - 20 sec hold - Supine Posterior Pelvic Tilt  - 1 x daily - 7 x weekly - 2 sets - 10 reps - Supine Lower Trunk Rotation  - 1 x daily - 7 x weekly - 2 sets - 5 reps - 10 sec hold - Clamshell  - 1 x daily - 7 x weekly - 2 sets - 10 reps  ASSESSMENT:  CLINICAL IMPRESSION: The patient benefits significantly from dry needling and manual therapy to stimulate underlying myofascial trigger points and muscular tissue for management of neuromusculoskeletal pain and address movement impairments.  Much improved soft tissue mobility and decreased tender point size and number following treatment session.   The patient had numerous muscle twitches produced during dry needling which is a good prognostic indicator for benefit.  Therapist progressing and updating HEP for increased intensity and challenge level for further strengthening and functional mobility.     OBJECTIVE IMPAIRMENTS: decreased activity tolerance, decreased ROM, decreased strength, increased fascial restrictions, impaired perceived functional ability, and pain.   ACTIVITY LIMITATIONS: sitting and standing  PARTICIPATION LIMITATIONS: driving, community activity, and occupation  PERSONAL FACTORS: 1 comorbidity: recent breast surgery for CA  are also affecting patient's functional outcome.   REHAB POTENTIAL: Good  CLINICAL DECISION MAKING: Stable/uncomplicated  EVALUATION COMPLEXITY: Low   GOALS: Goals reviewed with patient? Yes  SHORT TERM GOALS: Target date: 12/29/2021  The patient will demonstrate knowledge of basic self care strategies and exercises to promote healing   Baseline: Goal status: IN PROGRESS  2.  The patient will report a 40% improvement in pain levels with functional activities including sitting, driving, night time Baseline:  Goal status: INITIAL  3.   Pain centralized 50% of the time Baseline:  Goal status: INITIAL      LONG TERM GOALS: Target date: 01/26/2022  The patient will be independent in a safe self progression of a home exercise program to promote further recovery of function   Baseline:  Goal status: INITIAL  2.  The patient will report a 75% improvement in pain levels with functional activities which are currently difficult including sitting, riding in the car at night and with her period Baseline:  Goal status: INITIAL  3.  The patient will have improved hip strength to at least 4+/5 needed for standing, walking longer distances   Baseline:  Goal status: INITIAL   4.  The patient will have improved FOTO score to   86%    indicating improved function with less pain  Baseline:  Goal status: INITIAL   PLAN:  PT FREQUENCY: 1-2x/week  PT DURATION: 8 weeks  PLANNED INTERVENTIONS: Therapeutic exercises, Therapeutic activity, Neuromuscular re-education, Gait training, Patient/Family education, Self Care, Joint mobilization, Aquatic Therapy, Dry Needling, Spinal manipulation, Spinal mobilization, Cryotherapy, Moist heat, Taping, Ionotophoresis '4mg'$ /ml Dexamethasone, Manual therapy, and Re-evaluation.  PLAN FOR NEXT SESSION: assess response to DN;  patient may be able to lie prone for DN to lumbar multifidi;   lumbar extensions, use of  sciatic neural flossing; right glute strengthening; check STGs in 1  week  Ruben Im, PT 12/19/21 2:01 PM Phone: [phone] Fax: [facility], [address], [place] [address] Phone # [phone] Fax [phone]

## 2021-12-26 ENCOUNTER — Ambulatory Visit: Payer: 59 | Attending: Hematology and Oncology | Admitting: Physical Therapy

## 2021-12-26 DIAGNOSIS — M5431 Sciatica, right side: Secondary | ICD-10-CM | POA: Insufficient documentation

## 2021-12-26 DIAGNOSIS — M6281 Muscle weakness (generalized): Secondary | ICD-10-CM | POA: Diagnosis present

## 2021-12-26 NOTE — Therapy (Signed)
OUTPATIENT PHYSICAL THERAPY THORACOLUMBAR PROGRESS NOTE   Patient Name: Kaitlin [name] MRN: [id number] DOB:03-29-1975, 46 y.o., female Today's Date: 12/19/2021    PT End of Session - 12/26/21 1143     Visit Number 4    Number of Visits 15    Date for PT Re-Evaluation 01/26/22    Authorization Type 20 visit limit 5 used    Authorization - Visit Number 4    Authorization - Number of Visits 15    PT Start Time 8099    PT Stop Time 1125    PT Time Calculation (min) 1420 min    Activity Tolerance Patient tolerated treatment well               Past Medical History:  Diagnosis Date   Allergy    Anemia    Asthma    Cardiomyopathy ([place])    Chronic headaches    Delivery normal 11/2008   Family history of breast cancer    Family history of colon cancer    Family history of leukemia    Family history of melanoma    Family history of ovarian cancer    Heart murmur    Kidney stone    x of   PONV (postoperative nausea and vomiting)    S/P tonsillectomy    2000   Past Surgical History:  Procedure Laterality Date   BREAST RECONSTRUCTION WITH PLACEMENT OF TISSUE EXPANDER AND ALLODERM Bilateral 12/06/2020   Procedure: BREAST RECONSTRUCTION WITH PLACEMENT OF TISSUE EXPANDER AND ALLODERM;  Surgeon: Irene Limbo, MD;  Location: [facility];  Service: Plastics;  Laterality: Bilateral;   LEEP     MASTECTOMY W/ SENTINEL NODE BIOPSY Right 12/06/2020   Procedure: RIGHT MASTECTOMY WITH RIGHT AXILLARY SENTINEL LYMPH NODE BIOPSY;  Surgeon: Rolm Bookbinder, MD;  Location: [facility];  Service: General;  Laterality: Right;   REMOVAL OF BILATERAL TISSUE EXPANDERS WITH PLACEMENT OF BILATERAL BREAST IMPLANTS Bilateral 09/15/2021   Procedure: REMOVAL OF BILATERAL TISSUE EXPANDERS WITH PLACEMENT OF BILATERAL BREAST IMPLANTS;  Surgeon: Irene Limbo, MD;  Location: [facility];  Service: Plastics;  Laterality: Bilateral;   tonsil removal   01/22/1998   TOTAL MASTECTOMY Left 12/06/2020   Procedure: LEFT TOTAL MASTECTOMY;  Surgeon: Rolm Bookbinder, MD;  Location: [facility];  Service: General;  Laterality: Left;   Patient Active Problem List   Diagnosis Date Noted   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   Malignant neoplasm of upper-outer quadrant of right breast in female, estrogen receptor positive ([place]) 12/12/2020   S/P bilateral mastectomy 12/06/2020   Genetic testing 11/17/2020   Family history of breast cancer 11/02/2020   Family history of ovarian cancer 11/02/2020   Family history of colon cancer 11/02/2020   Family history of melanoma 11/02/2020   Family history of leukemia 11/02/2020   Bilateral breast cancer ([place]) 11/01/2020   LLQ abdominal pain 04/06/2011    PCP: [name] MD  REFERRING PROVIDER: [name] MD  REFERRING DIAG: M54.30 sciatica;  D05.12 ductal carninoma in situ of left breast  Rationale for Evaluation and Treatment: Rehabilitation  THERAPY DIAG: sciatica  ONSET DATE: 10/31/21  SUBJECTIVE:  SUBJECTIVE STATEMENT: Did well from the DN.  Depending how I sit it will give me zings in various areas.  Some new discomfort on left (ischial tuberosity).  Right hip flexors.  Back in bed sleeping 2 nights ago and slept on stomach.  Doing stretches 2-3x a day.  I work out every morning.   Been doing [place] dead lifts.       PERTINENT HISTORY:  Bil mastectomy with immediate reconstruction 12/06/20 with 1/3 LN positive.  Rt Grade 2 IDC with DCIS ER positive.  Lt breast DCIS intermediate, no LN. Radiation 01/27/21-03/14/2021 to Right chest.  Implants 09/15/21  Unable to lie prone secondary pain from recent breast surgery PAIN:  Are you having pain? Yes it was a 9/10  yesterday NPRS scale: 1-2/10 Pain location:  Right low back pain, buttock to post mid thigh constant  Aggravating factors: period; sitting in car;  worse at night; standing Relieving factors: Tiger Balm   PRECAUTIONS: Other: no heat near left breast  WEIGHT BEARING RESTRICTIONS: No  FALLS:  Has patient fallen in last 6 months? 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)  PLOF: Independent  PATIENT GOALS: alleviate pain (I know that Tamoxifen could be causing joint pain)     OBJECTIVE:   DIAGNOSTIC FINDINGS:  CT chest in May and August  PATIENT SURVEYS:  Eval:  FOTO 67%   COGNITION: Overall cognitive status: Within functional limits for tasks assessed      POSTURE: No Significant postural limitations  PALPATION: Numerous tender points in gluteals, restriction in QL  LUMBAR ROM:   Repeated movement testing indicates a possible extension preference however unable to lie prone  AROM eval  Flexion Fingertips to toes  Extension 15  Right lateral flexion 35  Left lateral flexion 35  Right rotation   Left rotation    (Blank rows = not tested)   LOWER EXTREMITY ROM:   WFLs  LOWER EXTREMITY MMT:  Decreased right hip abduction strength 4/5  LUMBAR SPECIAL TESTS:  Slump test: Negative  patient states she thinks this would have been painful to do yesterday  GAIT: Comments: normal  TODAY'S TREATMENT:       DATE: 12/26/2021 Review of HEP Reverse clams with ball between knees 10x Standing with double green band above knees 3 way hip in hip hinge position 10x right/left in front of mirror for visual feedback Standing double green band above knees side stepping 2 laps Standing double green band above knees monster walks Standing hip flexor stretch with pelvic tilt Seated neural floss 10x right/left Trigger Point Dry-Needling  Treatment instructions: Expect mild to moderate muscle soreness. S/S of pneumothorax if dry needled over a lung  field, and to seek immediate medical attention should they occur. Patient verbalized understanding of these instructions and education. Patient Consent Given: Yes Education handout provided: Yes Muscles treated: bil lumbar multifidi marinate;  right and left glute/piriformis in prone Electrical stimulation performed: No Parameters: N/A Treatment response/outcome: Utilized skilled palpation to identify trigger points.  Able to illicit twitch response and muscle elongation following. Manual Therapy:  Soft tissue mobilization to lumbar and glute/piriformis region   DATE: 12/19/2021 Left Sidelying clamshell with green band  x10 Reverse clams 10x Sidelying hip abduction 10x (difficult) Seated neural floss 10x right/left Standing hip abduction against ball on wall 10x right/left  Trigger Point Dry-Needling  Treatment instructions: Expect mild to moderate muscle soreness. S/S of pneumothorax if dry needled over a lung field, and  to seek immediate medical attention should they occur. Patient verbalized understanding of these instructions and education. Patient Consent Given: Yes Education handout provided: Yes Muscles treated: right glute/piriformis in sidelying Electrical stimulation performed: No Parameters: N/A Treatment response/outcome: Utilized skilled palpation to identify trigger points.  Able to illicit twitch response and muscle elongation following. Manual Therapy:  Soft tissue mobilization to lumbar and glute/piriformis region                                                                                                                            DATE: 12/11/2021 Nustep LE only Level 3 x6 minutes with PT present to discuss status Seated piriformis stretch 2x20 sec bilat Seated hamstring stretch 2x20 sec bilat Seated sciatic nerve tensioner 2x10 bilat Supine posterior pelvic tilt 2x10 Supine lower trunk rotation 5x10 sec Supine single knee to chest 2x20 sec bilat Left  Sidelying clamshell 2x10 Trigger Point Dry-Needling  Treatment instructions: Expect mild to moderate muscle soreness. S/S of pneumothorax if dry needled over a lung field, and to seek immediate medical attention should they occur. Patient verbalized understanding of these instructions and education. Patient Consent Given: Yes Education handout provided: Yes Muscles treated: right glute/piriformis, right lumbar multifidi prone with pillow under chest Electrical stimulation performed: No Parameters: N/A Treatment response/outcome: Utilized skilled palpation to identify trigger points.  Able to illicit twitch response and muscle elongation following. Manual Therapy:  Soft tissue mobilization to lumbar and glute/piriformis region in prone with pillow under chest   PATIENT EDUCATION:  Education details: plan of care;  DN after care handout Person educated: Patient Education method: Explanation Education comprehension: verbalized understanding  HOME EXERCISE PROGRAM: Access Code: [email or web address] URL: [email or web address] Date: 12/26/2021 Prepared by: Ruben Im  Exercises - Standing Lumbar Extension  - 1 x daily - 7 x weekly - 2 sets - 10 reps - Seated Piriformis Stretch  - 1 x daily - 7 x weekly - 2 sets - 2 reps - 20 sec hold - Seated Hamstring Stretch  - 1 x daily - 7 x weekly - 1 sets - 2 reps - 20 sec hold - Seated Sciatic Tensioner  - 1 x daily - 7 x weekly - 2 sets - 10 reps - Hooklying Single Knee to Chest Stretch  - 1 x daily - 7 x weekly - 1 sets - 2 reps - 20 sec hold - Supine Posterior Pelvic Tilt  - 1 x daily - 7 x weekly - 2 sets - 10 reps - Supine Lower Trunk Rotation  - 1 x daily - 7 x weekly - 2 sets - 5 reps - 10 sec hold - Clamshell  - 1 x daily - 7 x weekly - 2 sets - 10 reps - Seated Slump Nerve Glide  - 1 x daily - 7 x weekly - 1 sets - 10 reps - Clam with Resistance  - 1 x daily - 7 x weekly - 1 sets - 10 reps -  Beginner Reverse Clamshell  - 1 x daily -  7 x weekly - 1 sets - 10 reps - Sidelying Hip Abduction  - 1 x daily - 7 x weekly - 1 sets - 10 reps - Standing Isometric Hip Abduction with Ball on Wall  - 1 x daily - 7 x weekly - 1 sets - 10 reps - Side Stepping with Resistance at Thighs  - 1 x daily - 7 x weekly - 1 sets - 10 reps - Hip Flexor Stretch at Edge of Bed  - 1 x daily - 7 x weekly - 1 sets - 3 reps - 30 hold - Hip Flexor Stretch with Chair  - 1 x daily - 7 x weekly - 1 sets - 3 reps - 20 hold - ASSESSMENT:  CLINICAL IMPRESSION: Patient able to tolerate prone position today to best access deep lumbar multifidi.  Added treatment to left lumbo/pelvic/hip musculature as well today secondary to a spread of discomfort to this side as well.  The patient benefits significantly from dry needling and manual therapy to stimulate underlying myofascial trigger points and muscular tissue for management of neuromusculoskeletal pain and address movement impairments.  Much improved soft tissue mobility and decreased tender point size and number following treatment session.   She is highly compliant with her HEP which further improves long term positive outcomes.  Therapist progressing and updating HEP for increased intensity and challenge level for further strengthening and functional mobility.       OBJECTIVE IMPAIRMENTS: decreased activity tolerance, decreased ROM, decreased strength, increased fascial restrictions, impaired perceived functional ability, and pain.   ACTIVITY LIMITATIONS: sitting and standing  PARTICIPATION LIMITATIONS: driving, community activity, and occupation  PERSONAL FACTORS: 1 comorbidity: recent breast surgery for CA  are also affecting patient's functional outcome.   REHAB POTENTIAL: Good  CLINICAL DECISION MAKING: Stable/uncomplicated  EVALUATION COMPLEXITY: Low   GOALS: Goals reviewed with patient? Yes  SHORT TERM GOALS: Target date: 12/29/2021  The patient will demonstrate knowledge of basic self care  strategies and exercises to promote healing   Baseline: Goal status: IN PROGRESS  2.  The patient will report a 40% improvement in pain levels with functional activities including sitting, driving, night time Baseline:  Goal status: INITIAL  3.  Pain centralized 50% of the time Baseline:  Goal status: INITIAL      LONG TERM GOALS: Target date: 01/26/2022  The patient will be independent in a safe self progression of a home exercise program to promote further recovery of function   Baseline:  Goal status: INITIAL  2.  The patient will report a 75% improvement in pain levels with functional activities which are currently difficult including sitting, riding in the car at night and with her period Baseline:  Goal status: INITIAL  3.  The patient will have improved hip strength to at least 4+/5 needed for standing, walking longer distances   Baseline:  Goal status: INITIAL   4.  The patient will have improved FOTO score to   86%    indicating improved function with less pain  Baseline:  Goal status: INITIAL   PLAN:  PT FREQUENCY: 1-2x/week  PT DURATION: 8 weeks  PLANNED INTERVENTIONS: Therapeutic exercises, Therapeutic activity, Neuromuscular re-education, Gait training, Patient/Family education, Self Care, Joint mobilization, Aquatic Therapy, Dry Needling, Spinal manipulation, Spinal mobilization, Cryotherapy, Moist heat, Taping, Ionotophoresis '4mg'$ /ml Dexamethasone, Manual therapy, and Re-evaluation.  PLAN FOR NEXT SESSION: check STGs next visit;  recheck lumbar ROM next visit; Traveling 14  hours on 01/04/22;  assess response to DN;  patient able to lie prone for DN to lumbar multifidi;  use of  sciatic neural flossing; right glute strengthening   Ruben Im, PT 12/26/21 3:20 PM Phone: [phone] Fax: [facility] [address], [address] [place], [place] [address] Phone # [phone] Fax [phone]

## 2022-01-02 ENCOUNTER — Ambulatory Visit: Payer: 59 | Attending: Adult Health | Admitting: Rehabilitative and Restorative Service Providers"

## 2022-01-02 ENCOUNTER — Encounter: Payer: Self-pay | Admitting: Rehabilitative and Restorative Service Providers"

## 2022-01-02 DIAGNOSIS — Z170 Estrogen receptor positive status [ER+]: Secondary | ICD-10-CM | POA: Diagnosis present

## 2022-01-02 DIAGNOSIS — C50911 Malignant neoplasm of unspecified site of right female breast: Secondary | ICD-10-CM | POA: Insufficient documentation

## 2022-01-02 DIAGNOSIS — M5431 Sciatica, right side: Secondary | ICD-10-CM

## 2022-01-02 DIAGNOSIS — M6281 Muscle weakness (generalized): Secondary | ICD-10-CM

## 2022-01-02 DIAGNOSIS — C50912 Malignant neoplasm of unspecified site of left female breast: Secondary | ICD-10-CM | POA: Insufficient documentation

## 2022-01-02 NOTE — Therapy (Signed)
OUTPATIENT PHYSICAL THERAPY TREATMENT NOTE   Patient Name: Kaitlin [name] MRN: [id number] DOB:1975/12/13, 46 y.o., female Today's Date: 01/02/2022    PT End of Session - 01/02/22 0933     Visit Number 5    Date for PT Re-Evaluation 01/26/22    Authorization Type 20 visit limit 5 used    Authorization - Visit Number 5    Authorization - Number of Visits 15    PT Start Time 0930    PT Stop Time 1010    PT Time Calculation (min) 40 min    Activity Tolerance Patient tolerated treatment well    Behavior During Therapy WFL for tasks assessed/performed               Past Medical History:  Diagnosis Date   Allergy    Anemia    Asthma    Cardiomyopathy ([place])    Chronic headaches    Delivery normal 11/2008   Family history of breast cancer    Family history of colon cancer    Family history of leukemia    Family history of melanoma    Family history of ovarian cancer    Heart murmur    Kidney stone    x of   PONV (postoperative nausea and vomiting)    S/P tonsillectomy    2000   Past Surgical History:  Procedure Laterality Date   BREAST RECONSTRUCTION WITH PLACEMENT OF TISSUE EXPANDER AND ALLODERM Bilateral 12/06/2020   Procedure: BREAST RECONSTRUCTION WITH PLACEMENT OF TISSUE EXPANDER AND ALLODERM;  Surgeon: Irene Limbo, MD;  Location: [facility];  Service: Plastics;  Laterality: Bilateral;   LEEP     MASTECTOMY W/ SENTINEL NODE BIOPSY Right 12/06/2020   Procedure: RIGHT MASTECTOMY WITH RIGHT AXILLARY SENTINEL LYMPH NODE BIOPSY;  Surgeon: Rolm Bookbinder, MD;  Location: [facility];  Service: General;  Laterality: Right;   REMOVAL OF BILATERAL TISSUE EXPANDERS WITH PLACEMENT OF BILATERAL BREAST IMPLANTS Bilateral 09/15/2021   Procedure: REMOVAL OF BILATERAL TISSUE EXPANDERS WITH PLACEMENT OF BILATERAL BREAST IMPLANTS;  Surgeon: Irene Limbo, MD;  Location: [facility];  Service: Plastics;  Laterality:  Bilateral;   tonsil removal  01/22/1998   TOTAL MASTECTOMY Left 12/06/2020   Procedure: LEFT TOTAL MASTECTOMY;  Surgeon: Rolm Bookbinder, MD;  Location: [facility];  Service: General;  Laterality: Left;   Patient Active Problem List   Diagnosis Date Noted   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   Malignant neoplasm of upper-outer quadrant of right breast in female, estrogen receptor positive ([place]) 12/12/2020   S/P bilateral mastectomy 12/06/2020   Genetic testing 11/17/2020   Family history of breast cancer 11/02/2020   Family history of ovarian cancer 11/02/2020   Family history of colon cancer 11/02/2020   Family history of melanoma 11/02/2020   Family history of leukemia 11/02/2020   Bilateral breast cancer ([place]) 11/01/2020   LLQ abdominal pain 04/06/2011    PCP: [name] MD  REFERRING PROVIDER: [name] MD  REFERRING DIAG: M54.30 sciatica;  D05.12 ductal carninoma in situ of left breast  Rationale for Evaluation and Treatment: Rehabilitation  THERAPY DIAG: sciatica  ONSET DATE: 10/31/21  SUBJECTIVE:  SUBJECTIVE STATEMENT: Pt reports continued benefits with dry needling and states that the new exercises have been helping. Pt reports at least a 40% improvement in symptoms and was able to sit on hard bleachers for a basketball game.   PERTINENT HISTORY:  Bil mastectomy with immediate reconstruction 12/06/20 with 1/3 LN positive.  Rt Grade 2 IDC with DCIS ER positive.  Lt breast DCIS intermediate, no LN. Radiation 01/27/21-03/14/2021 to Right chest.  Implants 09/15/21  Unable to lie prone secondary pain from recent breast surgery PAIN:  Are you having pain? Yes it was a 1-2/10 yesterday NPRS scale: 1-2/10 Pain location:  Right low back pain,  buttock to post mid thigh constant  Aggravating factors: period; sitting in car;  worse at night; standing Relieving factors: Tiger Balm   PRECAUTIONS: Other: no heat near left breast  WEIGHT BEARING RESTRICTIONS: No  FALLS:  Has patient fallen in last 6 months? 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)  PLOF: Independent  PATIENT GOALS: alleviate pain (I know that Tamoxifen could be causing joint pain)     OBJECTIVE:   DIAGNOSTIC FINDINGS:  CT chest in May and August  PATIENT SURVEYS:  Eval:  FOTO 67%   COGNITION: Overall cognitive status: Within functional limits for tasks assessed      POSTURE: No Significant postural limitations  PALPATION: Numerous tender points in gluteals, restriction in QL  LUMBAR ROM:   Repeated movement testing indicates a possible extension preference however unable to lie prone  AROM eval  Flexion Fingertips to toes  Extension 15  Right lateral flexion 35  Left lateral flexion 35  Right rotation   Left rotation    (Blank rows = not tested)   LOWER EXTREMITY ROM:   WFLs  LOWER EXTREMITY MMT:  Decreased right hip abduction strength 4/5  LUMBAR SPECIAL TESTS:  Slump test: Negative  patient states she thinks this would have been painful to do yesterday  GAIT: Comments: normal  TODAY'S TREATMENT:        DATE: 01/02/2022 Seated neural floss 10x right/left Seated on green pball:  4 way pelvic tilt x20, LAQ 2x10 bilat Trigger Point Dry-Needling  Treatment instructions: Expect mild to moderate muscle soreness. S/S of pneumothorax if dry needled over a lung field, and to seek immediate medical attention should they occur. Patient verbalized understanding of these instructions and education. Patient Consent Given: Yes Education handout provided: Yes Muscles treated: bil lumbar multifidi marinate;  right and left glute/piriformis in prone Electrical stimulation performed: No Parameters:  N/A Treatment response/outcome: Utilized skilled palpation to identify trigger points.  Able to illicit twitch response and muscle elongation following. Manual Therapy:  Soft tissue mobilization to lumbar and glute/piriformis region  Prone hip extension 2x10 bilat Prone quad/hip flexor stretch with strap 2x20 sec bilat Sidelying reverse clamshell with ball between knees 2x10 bilat Side stepping with red loop 3x10 ft bilat FWD and backwards monster walking 3x10 ft each Side and back lunges with unilateral foot on slider x10 bilat   DATE: 12/26/2021 Review of HEP Reverse clams with ball between knees 10x Standing with double green band above knees 3 way hip in hip hinge position 10x right/left in front of mirror for visual feedback Standing double green band above knees side stepping 2 laps Standing double green band above knees monster walks Standing hip flexor stretch with pelvic tilt Seated neural floss 10x right/left Trigger Point Dry-Needling  Treatment instructions: Expect mild to moderate muscle soreness.  S/S of pneumothorax if dry needled over a lung field, and to seek immediate medical attention should they occur. Patient verbalized understanding of these instructions and education. Patient Consent Given: Yes Education handout provided: Yes Muscles treated: bil lumbar multifidi marinate;  right and left glute/piriformis in prone Electrical stimulation performed: No Parameters: N/A Treatment response/outcome: Utilized skilled palpation to identify trigger points.  Able to illicit twitch response and muscle elongation following. Manual Therapy:  Soft tissue mobilization to lumbar and glute/piriformis region   DATE: 12/19/2021 Left Sidelying clamshell with green band  x10 Reverse clams 10x Sidelying hip abduction 10x (difficult) Seated neural floss 10x right/left Standing hip abduction against ball on wall 10x right/left  Trigger Point Dry-Needling  Treatment instructions:  Expect mild to moderate muscle soreness. S/S of pneumothorax if dry needled over a lung field, and to seek immediate medical attention should they occur. Patient verbalized understanding of these instructions and education. Patient Consent Given: Yes Education handout provided: Yes Muscles treated: right glute/piriformis in sidelying Electrical stimulation performed: No Parameters: N/A Treatment response/outcome: Utilized skilled palpation to identify trigger points.  Able to illicit twitch response and muscle elongation following. Manual Therapy:  Soft tissue mobilization to lumbar and glute/piriformis region                                                                                                                             PATIENT EDUCATION:  Education details: plan of care;  DN after care handout Person educated: Patient Education method: Explanation Education comprehension: verbalized understanding  HOME EXERCISE PROGRAM: Access Code: [email or web address] URL: [email or web address] Date: 12/26/2021 Prepared by: Ruben Im  Exercises - Standing Lumbar Extension  - 1 x daily - 7 x weekly - 2 sets - 10 reps - Seated Piriformis Stretch  - 1 x daily - 7 x weekly - 2 sets - 2 reps - 20 sec hold - Seated Hamstring Stretch  - 1 x daily - 7 x weekly - 1 sets - 2 reps - 20 sec hold - Seated Sciatic Tensioner  - 1 x daily - 7 x weekly - 2 sets - 10 reps - Hooklying Single Knee to Chest Stretch  - 1 x daily - 7 x weekly - 1 sets - 2 reps - 20 sec hold - Supine Posterior Pelvic Tilt  - 1 x daily - 7 x weekly - 2 sets - 10 reps - Supine Lower Trunk Rotation  - 1 x daily - 7 x weekly - 2 sets - 5 reps - 10 sec hold - Clamshell  - 1 x daily - 7 x weekly - 2 sets - 10 reps - Seated Slump Nerve Glide  - 1 x daily - 7 x weekly - 1 sets - 10 reps - Clam with Resistance  - 1 x daily - 7 x weekly - 1 sets - 10 reps - Beginner Reverse Clamshell  - 1 x daily - 7 x weekly - 1 sets -  10  reps - Sidelying Hip Abduction  - 1 x daily - 7 x weekly - 1 sets - 10 reps - Standing Isometric Hip Abduction with Ball on Wall  - 1 x daily - 7 x weekly - 1 sets - 10 reps - Side Stepping with Resistance at Thighs  - 1 x daily - 7 x weekly - 1 sets - 10 reps - Hip Flexor Stretch at Edge of Bed  - 1 x daily - 7 x weekly - 1 sets - 3 reps - 30 hold - Hip Flexor Stretch with Chair  - 1 x daily - 7 x weekly - 1 sets - 3 reps - 20 hold - ASSESSMENT:  CLINICAL IMPRESSION: Ms Antone presents to skilled PT reporting that she is making progress and having decreased pain, but worries about how her back will do with the 14 hour drive that she has coming up later this week.  Patient with good response to dry needling and continues to report "looseness" following.  Patient with improved form noted during exercises and improved upright posture.  Educated pt on hip flexor stretching and provided a different variation, which pt reported allowed her a better stretch.  Patient to have a break in her PT sessions secondary to going out of town to visit friends and family, but states that she will continue her HEP.  Patient continues to require skilled PT to progress towards goal related activities and will assess how pt did with HEP and no dry needling/manual therapy for 2 weeks.    OBJECTIVE IMPAIRMENTS: decreased activity tolerance, decreased ROM, decreased strength, increased fascial restrictions, impaired perceived functional ability, and pain.   ACTIVITY LIMITATIONS: sitting and standing  PARTICIPATION LIMITATIONS: driving, community activity, and occupation  PERSONAL FACTORS: 1 comorbidity: recent breast surgery for CA  are also affecting patient's functional outcome.   REHAB POTENTIAL: Good  CLINICAL DECISION MAKING: Stable/uncomplicated  EVALUATION COMPLEXITY: Low   GOALS: Goals reviewed with patient? Yes  SHORT TERM GOALS: Target date: 12/29/2021  The patient will demonstrate knowledge of  basic self care strategies and exercises to promote healing   Baseline: Goal status: MET  2.  The patient will report a 40% improvement in pain levels with functional activities including sitting, driving, night time Baseline:  Goal status: MET  3.  Pain centralized 50% of the time Baseline:  Goal status: MET      LONG TERM GOALS: Target date: 01/26/2022  The patient will be independent in a safe self progression of a home exercise program to promote further recovery of function   Baseline:  Goal status: INITIAL  2.  The patient will report a 75% improvement in pain levels with functional activities which are currently difficult including sitting, riding in the car at night and with her period Baseline:  Goal status: INITIAL  3.  The patient will have improved hip strength to at least 4+/5 needed for standing, walking longer distances   Baseline:  Goal status: INITIAL   4.  The patient will have improved FOTO score to   86%    indicating improved function with less pain  Baseline:  Goal status: INITIAL   PLAN:  PT FREQUENCY: 1-2x/week  PT DURATION: 8 weeks  PLANNED INTERVENTIONS: Therapeutic exercises, Therapeutic activity, Neuromuscular re-education, Gait training, Patient/Family education, Self Care, Joint mobilization, Aquatic Therapy, Dry Needling, Spinal manipulation, Spinal mobilization, Cryotherapy, Moist heat, Taping, Ionotophoresis 83m/ml Dexamethasone, Manual therapy, and Re-evaluation.  PLAN FOR NEXT SESSION: check STGs next visit;  recheck lumbar ROM next visit; Traveling 14 hours on 01/04/22;  assess response to DN;  patient able to lie prone for DN to lumbar multifidi;  use of  sciatic neural flossing; right glute strengthening   Juel Burrow, PT 01/02/22 10:23 AM   [facility] [address], [place], [place] [address] Phone # [phone] Fax [phone]

## 2022-01-04 LAB — SIGNATERA
SIGNATERA MTM READOUT: 0 MTM/ml
SIGNATERA TEST RESULT: NEGATIVE

## 2022-01-05 ENCOUNTER — Telehealth: Payer: Self-pay

## 2022-01-05 NOTE — Telephone Encounter (Signed)
Attempted to call pt regarding signatera results lvm for pt to return call back.   

## 2022-01-06 ENCOUNTER — Encounter: Payer: Self-pay | Admitting: Hematology and Oncology

## 2022-01-16 ENCOUNTER — Ambulatory Visit: Payer: 59 | Admitting: Physical Therapy

## 2022-01-16 DIAGNOSIS — M6281 Muscle weakness (generalized): Secondary | ICD-10-CM

## 2022-01-16 DIAGNOSIS — M5431 Sciatica, right side: Secondary | ICD-10-CM | POA: Diagnosis not present

## 2022-01-16 NOTE — Therapy (Signed)
OUTPATIENT PHYSICAL THERAPY TREATMENT NOTE   Patient Name: Kaitlin [name] MRN: [id number] DOB:07/15/1975, 46 y.o., female Today's Date: 01/16/2022    PT End of Session - 01/16/22 1107     Visit Number 6    Number of Visits 15    Date for PT Re-Evaluation 01/26/22    Authorization Type 20 visit limit 5 used    Authorization - Visit Number 6    Authorization - Number of Visits 15    PT Start Time 1105    PT Stop Time 1145    PT Time Calculation (min) 40 min    Activity Tolerance Patient tolerated treatment well               Past Medical History:  Diagnosis Date   Allergy    Anemia    Asthma    Cardiomyopathy (HCC)    Chronic headaches    Delivery normal 11/2008   Family history of breast cancer    Family history of colon cancer    Family history of leukemia    Family history of melanoma    Family history of ovarian cancer    Heart murmur    Kidney stone    x of   PONV (postoperative nausea and vomiting)    S/P tonsillectomy    2000   Past Surgical History:  Procedure Laterality Date   BREAST RECONSTRUCTION WITH PLACEMENT OF TISSUE EXPANDER AND ALLODERM Bilateral 12/06/2020   Procedure: BREAST RECONSTRUCTION WITH PLACEMENT OF TISSUE EXPANDER AND ALLODERM;  Surgeon: Thimmappa, Brinda, MD;  Location: [facility];  Service: Plastics;  Laterality: Bilateral;   LEEP     MASTECTOMY W/ SENTINEL NODE BIOPSY Right 12/06/2020   Procedure: RIGHT MASTECTOMY WITH RIGHT AXILLARY SENTINEL LYMPH NODE BIOPSY;  Surgeon: Wakefield, Matthew, MD;  Location: [facility];  Service: General;  Laterality: Right;   REMOVAL OF BILATERAL TISSUE EXPANDERS WITH PLACEMENT OF BILATERAL BREAST IMPLANTS Bilateral 09/15/2021   Procedure: REMOVAL OF BILATERAL TISSUE EXPANDERS WITH PLACEMENT OF BILATERAL BREAST IMPLANTS;  Surgeon: Thimmappa, Brinda, MD;  Location: [facility];  Service: Plastics;  Laterality: Bilateral;   tonsil removal  01/22/1998    TOTAL MASTECTOMY Left 12/06/2020   Procedure: LEFT TOTAL MASTECTOMY;  Surgeon: Wakefield, Matthew, MD;  Location: [facility];  Service: General;  Laterality: Left;   Patient Active Problem List   Diagnosis Date Noted   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   Malignant neoplasm of upper-outer quadrant of right breast in female, estrogen receptor positive (HCC) 12/12/2020   S/P bilateral mastectomy 12/06/2020   Genetic testing 11/17/2020   Family history of breast cancer 11/02/2020   Family history of ovarian cancer 11/02/2020   Family history of colon cancer 11/02/2020   Family history of melanoma 11/02/2020   Family history of leukemia 11/02/2020   Bilateral breast cancer (HCC) 11/01/2020   LLQ abdominal pain 04/06/2011    PCP: Smith,Candace MD  REFERRING PROVIDER: Gudena, Vinay MD  REFERRING DIAG: M54.30 sciatica;  D05.12 ductal carninoma in situ of left breast  Rationale for Evaluation and Treatment: Rehabilitation  THERAPY DIAG: sciatica  ONSET DATE: 10/31/21  SUBJECTIVE:                                                                                                                                                                                             SUBJECTIVE STATEMENT: DN really helped before the long drive.  Also had a massage.  I worked out every day I was gone.  Sore in both piriformis areas and along ITB. Sleeping on stomach 20 min here and there.  Hip thrusters really make me feel better.    PERTINENT HISTORY:  Bil mastectomy with immediate reconstruction 12/06/20 with 1/3 LN positive.  Rt Grade 2 IDC with DCIS ER positive.  Lt breast DCIS intermediate, no LN. Radiation 01/27/21-03/14/2021 to Right chest.  Implants 09/15/21  Unable to lie prone secondary pain from recent breast surgery PAIN:  Are you having pain? Yes  NPRS scale: 1-2/10 Pain location:  Right low back pain, buttock to post mid thigh constant  Aggravating  factors: period; sitting in car;  worse at night; standing Relieving factors: Tiger Balm   PRECAUTIONS: Other: no heat near left breast  WEIGHT BEARING RESTRICTIONS: No  FALLS:  Has patient fallen in last 6 months? 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)  PLOF: Independent  PATIENT GOALS: alleviate pain (I know that Tamoxifen could be causing joint pain)     OBJECTIVE:   DIAGNOSTIC FINDINGS:  CT chest in May and August  PATIENT SURVEYS:  Eval:  FOTO 67%   COGNITION: Overall cognitive status: Within functional limits for tasks assessed      POSTURE: No Significant postural limitations  PALPATION: Numerous tender points in gluteals, restriction in QL  LUMBAR ROM:   Repeated movement testing indicates a possible extension preference however unable to lie prone  AROM eval  Flexion Fingertips to toes  Extension 15  Right lateral flexion 35  Left lateral flexion 35  Right rotation   Left rotation    (Blank rows = not tested)   LOWER EXTREMITY ROM:   WFLs  LOWER EXTREMITY MMT:  Decreased right hip abduction strength 4/5  LUMBAR SPECIAL TESTS:  Slump test: Negative  patient states she thinks this would have been painful to do yesterday  GAIT: Comments: normal  TODAY'S TREATMENT:       DATE: 2022/02/02 Single leg dead lifts; staggered stance dead lifts 10x each Hip thrusts with X-heavy purple loop Review of current ex program Trigger Point Dry-Needling  Treatment instructions: Expect mild to moderate muscle soreness. S/S of pneumothorax if dry needled over a lung field, and to seek immediate medical attention should they occur. Patient verbalized understanding of these instructions and education. Patient Consent Given: Yes Education handout provided: Yes Muscles treated: bil lumbar multifidi marinate;  right and left glute/piriformis in prone Electrical stimulation performed: yes to bil multifid and gluteals bil Parameters:  2.5 ma 8 min Treatment response/outcome: Utilized skilled palpation to identify trigger points.  Able to illicit twitch response and muscle elongation following. Manual Therapy:  Soft tissue mobilization to lumbar and glute/piriformis region       DATE: 01/02/2022 Seated neural floss 10x right/left Seated on green pball:  4 way pelvic tilt x20, LAQ 2x10 bilat Trigger Point Dry-Needling  Treatment instructions: Expect mild to moderate muscle soreness. S/S of pneumothorax if dry needled over a lung field, and to seek immediate medical attention should they occur. Patient verbalized understanding of these instructions and education. Patient Consent Given: Yes Education handout provided: Yes Muscles treated: bil lumbar multifidi marinate;  right and left glute/piriformis in prone Electrical stimulation performed: No Parameters: N/A Treatment response/outcome: Utilized skilled palpation to identify trigger points.  Able to illicit twitch response and muscle elongation following. Manual Therapy:  Soft tissue mobilization to lumbar and glute/piriformis region  Prone hip extension 2x10 bilat Prone quad/hip flexor stretch with strap 2x20 sec bilat Sidelying reverse clamshell with ball between knees 2x10 bilat Side stepping with red loop 3x10 ft bilat FWD and backwards monster walking 3x10 ft each Side and back lunges with unilateral foot on slider x10 bilat   DATE: 12/26/2021 Review of HEP Reverse clams with ball between knees 10x Standing with double green band above knees 3 way hip in hip hinge position 10x right/left in front of mirror for visual feedback Standing double green band above knees side stepping 2 laps Standing double green band above knees monster walks Standing hip flexor stretch with pelvic tilt Seated neural floss 10x right/left Trigger Point Dry-Needling  Treatment instructions: Expect mild to moderate muscle soreness. S/S of pneumothorax if dry needled over a lung  field, and to seek immediate medical attention should they occur. Patient verbalized understanding of these instructions and education. Patient Consent Given: Yes Education handout provided: Yes Muscles treated: bil lumbar multifidi marinate;  right and left glute/piriformis in prone Electrical stimulation performed: No Parameters: N/A Treatment response/outcome: Utilized skilled palpation to identify trigger points.  Able to illicit twitch response and muscle elongation following. Manual Therapy:  Soft tissue mobilization to lumbar and glute/piriformis region                                                                                PATIENT EDUCATION:  Education details: plan of care;  DN after care handout Person educated: Patient Education method: Explanation Education comprehension: verbalized understanding  HOME EXERCISE PROGRAM: Access Code: [email or web address] URL: [email or web address] Date: 12/26/2021 Prepared by: Stacy Simpson  Exercises - Standing Lumbar Extension  - 1 x daily - 7 x weekly - 2 sets - 10 reps - Seated Piriformis Stretch  - 1 x daily - 7 x weekly - 2 sets - 2 reps - 20 sec hold - Seated Hamstring Stretch  - 1 x daily - 7 x weekly - 1 sets - 2 reps - 20 sec hold - Seated Sciatic Tensioner  - 1 x daily - 7 x weekly - 2 sets - 10 reps - Hooklying Single Knee to Chest Stretch  - 1 x daily - 7 x weekly - 1 sets - 2 reps - 20 sec hold - Supine Posterior Pelvic Tilt  - 1 x daily - 7 x weekly - 2 sets - 10 reps - Supine Lower Trunk Rotation  - 1 x daily - 7 x weekly - 2 sets - 5 reps - 10 sec hold - Clamshell  - 1 x daily - 7 x weekly - 2 sets - 10 reps - Seated Slump Nerve Glide  - 1 x daily - 7 x weekly - 1 sets - 10 reps - Clam with Resistance  - 1 x daily - 7 x weekly - 1 sets - 10 reps - Beginner Reverse Clamshell  - 1 x daily - 7 x weekly - 1 sets - 10 reps - Sidelying Hip Abduction  - 1 x daily - 7 x weekly - 1 sets - 10 reps - Standing Isometric  Hip Abduction with Ball on Wall  -   1 x daily - 7 x weekly - 1 sets - 10 reps - Side Stepping with Resistance at Thighs  - 1 x daily - 7 x weekly - 1 sets - 10 reps - Hip Flexor Stretch at Edge of Bed  - 1 x daily - 7 x weekly - 1 sets - 3 reps - 30 hold - Hip Flexor Stretch with Chair  - 1 x daily - 7 x weekly - 1 sets - 3 reps - 20 hold - ASSESSMENT:  CLINICAL IMPRESSION: The patient is highly compliant with HEP since she feels better with regular performance.  Discussed how to progress ex's for more challenge and intensity.  Added ES to DN for further benefit and lasting pain relief.  She was able to tolerate lying prone very well for this with a pillow under the chest.    OBJECTIVE IMPAIRMENTS: decreased activity tolerance, decreased ROM, decreased strength, increased fascial restrictions, impaired perceived functional ability, and pain.   ACTIVITY LIMITATIONS: sitting and standing  PARTICIPATION LIMITATIONS: driving, community activity, and occupation  PERSONAL FACTORS: 1 comorbidity: recent breast surgery for CA  are also affecting patient's functional outcome.   REHAB POTENTIAL: Good  CLINICAL DECISION MAKING: Stable/uncomplicated  EVALUATION COMPLEXITY: Low   GOALS: Goals reviewed with patient? Yes  SHORT TERM GOALS: Target date: 12/29/2021  The patient will demonstrate knowledge of basic self care strategies and exercises to promote healing   Baseline: Goal status: MET  2.  The patient will report a 40% improvement in pain levels with functional activities including sitting, driving, night time Baseline:  Goal status: MET  3.  Pain centralized 50% of the time Baseline:  Goal status: MET      LONG TERM GOALS: Target date: 01/26/2022  The patient will be independent in a safe self progression of a home exercise program to promote further recovery of function   Baseline:  Goal status: INITIAL  2.  The patient will report a 75% improvement in pain levels with  functional activities which are currently difficult including sitting, riding in the car at night and with her period Baseline:  Goal status: INITIAL  3.  The patient will have improved hip strength to at least 4+/5 needed for standing, walking longer distances   Baseline:  Goal status: INITIAL   4.  The patient will have improved FOTO score to   86%    indicating improved function with less pain  Baseline:  Goal status: INITIAL   PLAN:  PT FREQUENCY: 1-2x/week  PT DURATION: 8 weeks  PLANNED INTERVENTIONS: Therapeutic exercises, Therapeutic activity, Neuromuscular re-education, Gait training, Patient/Family education, Self Care, Joint mobilization, Aquatic Therapy, Dry Needling, Spinal manipulation, Spinal mobilization, Cryotherapy, Moist heat, Taping, Ionotophoresis 4mg/ml Dexamethasone, Manual therapy, and Re-evaluation.  PLAN FOR NEXT SESSION: ERO; FOTO; may reduce treatment frequency;  recheck lumbar ROM next visit;   assess response to DN with added ES;  patient able to lie prone for DN to lumbar multifidi;  use of  sciatic neural flossing; right glute strengthening   Stacy Simpson, PT 01/16/22 4:38 PM Phone: [phone] Fax: [phone]  [facility] [address], [address] [place], [place] [address] Phone # [phone] Fax [phone]  

## 2022-01-17 ENCOUNTER — Ambulatory Visit: Payer: 59

## 2022-01-17 DIAGNOSIS — C50911 Malignant neoplasm of unspecified site of right female breast: Secondary | ICD-10-CM

## 2022-01-17 DIAGNOSIS — C50912 Malignant neoplasm of unspecified site of left female breast: Secondary | ICD-10-CM

## 2022-01-17 NOTE — Therapy (Deleted)
OUTPATIENT PHYSICAL THERAPY SOZO SCREENING NOTE   Patient Name: Kaitlin [name] MRN: [id number] DOB:Feb 28, 1975, 46 y.o., female Today's Date: 01/17/2022  PCP: [name], MD REFERRING PROVIDER: Wilber Bihari Cornett*    Past Medical History:  Diagnosis Date   Allergy    Anemia    Asthma    Cardiomyopathy ([place])    Chronic headaches    Delivery normal 11/2008   Family history of breast cancer    Family history of colon cancer    Family history of leukemia    Family history of melanoma    Family history of ovarian cancer    Heart murmur    Kidney stone    x of   PONV (postoperative nausea and vomiting)    S/P tonsillectomy    2000   Past Surgical History:  Procedure Laterality Date   BREAST RECONSTRUCTION WITH PLACEMENT OF TISSUE EXPANDER AND ALLODERM Bilateral 12/06/2020   Procedure: BREAST RECONSTRUCTION WITH PLACEMENT OF TISSUE EXPANDER AND ALLODERM;  Surgeon: Irene Limbo, MD;  Location: [facility];  Service: Plastics;  Laterality: Bilateral;   LEEP     MASTECTOMY W/ SENTINEL NODE BIOPSY Right 12/06/2020   Procedure: RIGHT MASTECTOMY WITH RIGHT AXILLARY SENTINEL LYMPH NODE BIOPSY;  Surgeon: Rolm Bookbinder, MD;  Location: [place];  Service: General;  Laterality: Right;   REMOVAL OF BILATERAL TISSUE EXPANDERS WITH PLACEMENT OF BILATERAL BREAST IMPLANTS Bilateral 09/15/2021   Procedure: REMOVAL OF BILATERAL TISSUE EXPANDERS WITH PLACEMENT OF BILATERAL BREAST IMPLANTS;  Surgeon: Irene Limbo, MD;  Location: [place];  Service: Plastics;  Laterality: Bilateral;   tonsil removal  01/22/1998   TOTAL MASTECTOMY Left 12/06/2020   Procedure: LEFT TOTAL MASTECTOMY;  Surgeon: Rolm Bookbinder, MD;  Location: [facility];  Service: General;  Laterality: Left;   Patient Active Problem List   Diagnosis Date Noted   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   Malignant neoplasm of  upper-outer quadrant of right breast in female, estrogen receptor positive ([place]) 12/12/2020   S/P bilateral mastectomy 12/06/2020   Genetic testing 11/17/2020   Family history of breast cancer 11/02/2020   Family history of ovarian cancer 11/02/2020   Family history of colon cancer 11/02/2020   Family history of melanoma 11/02/2020   Family history of leukemia 11/02/2020   Bilateral breast cancer ([place]) 11/01/2020   LLQ abdominal pain 04/06/2011    REFERRING DIAG: {RIGHT/LEFT:21944} breast cancer at risk for lymphedema  THERAPY DIAG:  No diagnosis found.  PERTINENT HISTORY: ***  PRECAUTIONS: {RIGHT/LEFT:21944} UE Lymphedema risk, {Therapy precautions:24002}  SUBJECTIVE: ***  PAIN:  Are you having pain? {OPRCPAIN:27236}  SOZO SCREENING: Patient was assessed today using the SOZO machine to determine the lymphedema index score. This was compared to her baseline score. It was determined that she is within the recommended range when compared to her baseline and no further action is needed at this time. She will continue SOZO screenings. These are done every 3 months for 2 years post operatively followed by every 6 months for 2 years, and then annually.  Patient was assessed today using the SOZO machine to determine the lymphedema index score. This was compared to her baseline score. It was determined that she is NOT within the recommended range when compared to her baseline and so she was fitted for a compression garment while in the clinic today. It is recommended she return in 1 month to be reassessed. If she continues to measure outside the  recommended range, physical therapy treatment will be recommended at that time and a referral requested.    Claris Pong, PT 01/17/2022, 7:57 AM

## 2022-01-17 NOTE — Therapy (Signed)
OUTPATIENT PHYSICAL THERAPY SOZO SCREENING NOTE   Patient Name: Kaitlin [name] MRN: [id number] DOB:1975/10/29, 46 y.o., female Today's Date: 01/17/2022  PCP: [name], MD REFERRING PROVIDER: Wilber Bihari Cornett*   PT End of Session - 01/17/22 0759     Visit Number 6   unchanged due to screen   Number of Visits 15    Date for PT Re-Evaluation 01/26/22    PT Start Time 0803    PT Stop Time 0814    PT Time Calculation (min) 11 min    Activity Tolerance Patient tolerated treatment well    Behavior During Therapy [facility] for tasks assessed/performed             Past Medical History:  Diagnosis Date   Allergy    Anemia    Asthma    Cardiomyopathy ([place])    Chronic headaches    Delivery normal 11/2008   Family history of breast cancer    Family history of colon cancer    Family history of leukemia    Family history of melanoma    Family history of ovarian cancer    Heart murmur    Kidney stone    x of   PONV (postoperative nausea and vomiting)    S/P tonsillectomy    2000   Past Surgical History:  Procedure Laterality Date   BREAST RECONSTRUCTION WITH PLACEMENT OF TISSUE EXPANDER AND ALLODERM Bilateral 12/06/2020   Procedure: BREAST RECONSTRUCTION WITH PLACEMENT OF TISSUE EXPANDER AND ALLODERM;  Surgeon: Irene Limbo, MD;  Location: [place];  Service: Plastics;  Laterality: Bilateral;   LEEP     MASTECTOMY W/ SENTINEL NODE BIOPSY Right 12/06/2020   Procedure: RIGHT MASTECTOMY WITH RIGHT AXILLARY SENTINEL LYMPH NODE BIOPSY;  Surgeon: Rolm Bookbinder, MD;  Location: [place];  Service: General;  Laterality: Right;   REMOVAL OF BILATERAL TISSUE EXPANDERS WITH PLACEMENT OF BILATERAL BREAST IMPLANTS Bilateral 09/15/2021   Procedure: REMOVAL OF BILATERAL TISSUE EXPANDERS WITH PLACEMENT OF BILATERAL BREAST IMPLANTS;  Surgeon: Irene Limbo, MD;  Location: [place];  Service: Plastics;  Laterality:  Bilateral;   tonsil removal  01/22/1998   TOTAL MASTECTOMY Left 12/06/2020   Procedure: LEFT TOTAL MASTECTOMY;  Surgeon: Rolm Bookbinder, MD;  Location: [place] [facility];  Service: General;  Laterality: Left;   Patient Active Problem List   Diagnosis Date Noted   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   Malignant neoplasm of upper-outer quadrant of right breast in female, estrogen receptor positive ([place]) 12/12/2020   S/P bilateral mastectomy 12/06/2020   Genetic testing 11/17/2020   Family history of breast cancer 11/02/2020   Family history of ovarian cancer 11/02/2020   Family history of colon cancer 11/02/2020   Family history of melanoma 11/02/2020   Family history of leukemia 11/02/2020   Bilateral breast cancer ([place]) 11/01/2020   LLQ abdominal pain 04/06/2011    REFERRING DIAG: right breast cancer at risk for lymphedema  THERAPY DIAG:  Malignant neoplasm of right breast in female, estrogen receptor positive, unspecified site of breast ([place])  Malignant neoplasm of left female breast, unspecified estrogen receptor status, unspecified site of breast ([place])  PERTINENT HISTORY: Bil mastectomy with immediate reconstruction 12/06/20 with 1/3 LN positive.  Rt Grade 2 IDC with DCIS ER positive.  Lt breast DCIS intermediate, no LN. Radiation 01/27/21-03/14/2021 to Right chest. Will have surgery this summer for expander exchange  PRECAUTIONS: right UE Lymphedema risk  SUBJECTIVE: Doing well. No  pain or swelling  PAIN:  Are you having pain? No  SOZO SCREENING: Patient was assessed today using the SOZO machine to determine the lymphedema index score. This was compared to her baseline score. It was determined that she is within the recommended range when compared to her baseline and no further action is needed at this time. She will continue SOZO screenings. These are done every 3 months for 2 years post operatively followed by every 6 months for 2 years, and  then annually.     Claris Pong, PT 01/17/2022, 8:16 AM

## 2022-01-30 ENCOUNTER — Ambulatory Visit: Payer: 59 | Attending: Adult Health | Admitting: Physical Therapy

## 2022-01-30 DIAGNOSIS — M6281 Muscle weakness (generalized): Secondary | ICD-10-CM

## 2022-01-30 DIAGNOSIS — M5431 Sciatica, right side: Secondary | ICD-10-CM

## 2022-01-30 NOTE — Therapy (Signed)
OUTPATIENT PHYSICAL THERAPY TREATMENT NOTE/RECERTIFICATION   Patient Name: Kaitlin [name] MRN: [id number] DOB:Jun 16, 1975, 47 y.o., female Today's Date: 01/30/2022    PT End of Session - 01/30/22 0843     Visit Number 7    Number of Visits 15    Date for PT Re-Evaluation 04/24/22    Authorization Type 20 visit limit 5 used-  may get more visits start of year    Authorization - Visit Number 7    Authorization - Number of Visits 15    PT Start Time 0845    PT Stop Time 0929    PT Time Calculation (min) 44 min    Activity Tolerance Patient tolerated treatment well               Past Medical History:  Diagnosis Date   Allergy    Anemia    Asthma    Cardiomyopathy ([place])    Chronic headaches    Delivery normal 11/2008   Family history of breast cancer    Family history of colon cancer    Family history of leukemia    Family history of melanoma    Family history of ovarian cancer    Heart murmur    Kidney stone    x of   PONV (postoperative nausea and vomiting)    S/P tonsillectomy    2000   Past Surgical History:  Procedure Laterality Date   BREAST RECONSTRUCTION WITH PLACEMENT OF TISSUE EXPANDER AND ALLODERM Bilateral 12/06/2020   Procedure: BREAST RECONSTRUCTION WITH PLACEMENT OF TISSUE EXPANDER AND ALLODERM;  Surgeon: Irene Limbo, MD;  Location: [facility];  Service: Plastics;  Laterality: Bilateral;   LEEP     MASTECTOMY W/ SENTINEL NODE BIOPSY Right 12/06/2020   Procedure: RIGHT MASTECTOMY WITH RIGHT AXILLARY SENTINEL LYMPH NODE BIOPSY;  Surgeon: Rolm Bookbinder, MD;  Location: [facility];  Service: General;  Laterality: Right;   REMOVAL OF BILATERAL TISSUE EXPANDERS WITH PLACEMENT OF BILATERAL BREAST IMPLANTS Bilateral 09/15/2021   Procedure: REMOVAL OF BILATERAL TISSUE EXPANDERS WITH PLACEMENT OF BILATERAL BREAST IMPLANTS;  Surgeon: Irene Limbo, MD;  Location: [facility];  Service: Plastics;   Laterality: Bilateral;   tonsil removal  01/22/1998   TOTAL MASTECTOMY Left 12/06/2020   Procedure: LEFT TOTAL MASTECTOMY;  Surgeon: Rolm Bookbinder, MD;  Location: [facility];  Service: General;  Laterality: Left;   Patient Active Problem List   Diagnosis Date Noted   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   Malignant neoplasm of upper-outer quadrant of right breast in female, estrogen receptor positive ([place]) 12/12/2020   S/P bilateral mastectomy 12/06/2020   Genetic testing 11/17/2020   Family history of breast cancer 11/02/2020   Family history of ovarian cancer 11/02/2020   Family history of colon cancer 11/02/2020   Family history of melanoma 11/02/2020   Family history of leukemia 11/02/2020   Bilateral breast cancer ([place]) 11/01/2020   LLQ abdominal pain 04/06/2011    PCP: [name] MD  REFERRING PROVIDER: [name] MD  REFERRING DIAG: M54.30 sciatica;  D05.12 ductal carninoma in situ of left breast  Rationale for Evaluation and Treatment: Rehabilitation  THERAPY DIAG: sciatica  ONSET DATE: 10/31/21  SUBJECTIVE:  SUBJECTIVE STATEMENT: I'm OK. I started my period and both of my hips were killing me.  Kind of a bursitis achiness.  Stretches and other ex's really help.     PERTINENT HISTORY:  Bil mastectomy with immediate reconstruction 12/06/20 with 1/3 LN positive.  Rt Grade 2 IDC with DCIS ER positive.  Lt breast DCIS intermediate, no LN. Radiation 01/27/21-03/14/2021 to Right chest.  Implants 09/15/21  Unable to lie prone secondary pain from recent breast surgery PAIN:  Are you having pain? Yes  NPRS scale: 1-2/10 Pain location:  bilateral low back pain; left > right lateral hips Aggravating factors: period; sitting in car;  worse at night;  standing Relieving factors: Tiger Balm   PRECAUTIONS: Other: no heat near left breast  WEIGHT BEARING RESTRICTIONS: No  FALLS:  Has patient fallen in last 6 months? 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)  PLOF: Independent  PATIENT GOALS: alleviate pain (I know that Tamoxifen could be causing joint pain)     OBJECTIVE:   DIAGNOSTIC FINDINGS:  CT chest in May and August  PATIENT SURVEYS:  Eval:  FOTO 67%  1/9: 74%   COGNITION: Overall cognitive status: Within functional limits for tasks assessed      POSTURE: No Significant postural limitations  PALPATION: Numerous tender points in gluteals, restriction in QL  LUMBAR ROM:   Repeated movement testing indicates a possible extension preference however unable to lie prone  AROM eval 1/9  Flexion Fingertips to toes To toes 82 degrees  Extension 15 30  Right lateral flexion 35 45  Left lateral flexion 35 45  Right rotation    Left rotation     (Blank rows = not tested)   LOWER EXTREMITY ROM:   WFLs  LOWER EXTREMITY MMT:  Decreased right hip abduction strength 4/5 1/9: left hip abduction 4/5, right 4+/5  LUMBAR SPECIAL TESTS:  Slump test: Negative  patient states she thinks this would have been painful to do yesterday 1/9: negative GAIT: Comments: normal  TODAY'S TREATMENT:      DATE: 01/30/22: FOTO Lumbar ROM MMT trunk and LE Anti-inflammatory strategies Review of current ex program Trigger Point Dry-Needling  Treatment instructions: Expect mild to moderate muscle soreness. S/S of pneumothorax if dry needled over a lung field, and to seek immediate medical attention should they occur. Patient verbalized understanding of these instructions and education. Patient Consent Given: Yes Education handout provided: Yes Muscles treated: bil lumbar multifidi marinate;  right and left glute/piriformis in prone; extra DN to left gluteals and piriformis today Electrical stimulation  performed: yes to bil multifid and gluteals bil Parameters: 2.5 ma 8 min Treatment response/outcome: Utilized skilled palpation to identify trigger points.  Able to illicit twitch response and muscle elongation following. Manual Therapy:  Soft tissue mobilization to lumbar and glute/piriformis region        DATE: 31-Jan-2022 Single leg dead lifts; staggered stance dead lifts 10x each Hip thrusts with X-heavy purple loop Review of current ex program Trigger Point Dry-Needling  Treatment instructions: Expect mild to moderate muscle soreness. S/S of pneumothorax if dry needled over a lung field, and to seek immediate medical attention should they occur. Patient verbalized understanding of these instructions and education. Patient Consent Given: Yes Education handout provided: Yes Muscles treated: bil lumbar multifidi marinate;  right and left glute/piriformis in prone Electrical stimulation performed: yes to bil multifid and gluteals bil Parameters: 2.5 ma 8 min Treatment response/outcome: Utilized skilled palpation to identify  trigger points.  Able to illicit twitch response and muscle elongation following. Manual Therapy:  Soft tissue mobilization to lumbar and glute/piriformis region       DATE: 01/02/2022 Seated neural floss 10x right/left Seated on green pball:  4 way pelvic tilt x20, LAQ 2x10 bilat Trigger Point Dry-Needling  Treatment instructions: Expect mild to moderate muscle soreness. S/S of pneumothorax if dry needled over a lung field, and to seek immediate medical attention should they occur. Patient verbalized understanding of these instructions and education. Patient Consent Given: Yes Education handout provided: Yes Muscles treated: bil lumbar multifidi marinate;  right and left glute/piriformis in prone Electrical stimulation performed: No Parameters: N/A Treatment response/outcome: Utilized skilled palpation to identify trigger points.  Able to illicit twitch  response and muscle elongation following. Manual Therapy:  Soft tissue mobilization to lumbar and glute/piriformis region  Prone hip extension 2x10 bilat Prone quad/hip flexor stretch with strap 2x20 sec bilat Sidelying reverse clamshell with ball between knees 2x10 bilat Side stepping with red loop 3x10 ft bilat FWD and backwards monster walking 3x10 ft each Side and back lunges with unilateral foot on slider x10 bilat   PATIENT EDUCATION:  Education details: plan of care;  DN after care handout Person educated: Patient Education method: Explanation Education comprehension: verbalized understanding  HOME EXERCISE PROGRAM: Access Code: [email or web address] URL: [email or web address] Date: 12/26/2021 Prepared by: Ruben Im  Exercises - Standing Lumbar Extension  - 1 x daily - 7 x weekly - 2 sets - 10 reps - Seated Piriformis Stretch  - 1 x daily - 7 x weekly - 2 sets - 2 reps - 20 sec hold - Seated Hamstring Stretch  - 1 x daily - 7 x weekly - 1 sets - 2 reps - 20 sec hold - Seated Sciatic Tensioner  - 1 x daily - 7 x weekly - 2 sets - 10 reps - Hooklying Single Knee to Chest Stretch  - 1 x daily - 7 x weekly - 1 sets - 2 reps - 20 sec hold - Supine Posterior Pelvic Tilt  - 1 x daily - 7 x weekly - 2 sets - 10 reps - Supine Lower Trunk Rotation  - 1 x daily - 7 x weekly - 2 sets - 5 reps - 10 sec hold - Clamshell  - 1 x daily - 7 x weekly - 2 sets - 10 reps - Seated Slump Nerve Glide  - 1 x daily - 7 x weekly - 1 sets - 10 reps - Clam with Resistance  - 1 x daily - 7 x weekly - 1 sets - 10 reps - Beginner Reverse Clamshell  - 1 x daily - 7 x weekly - 1 sets - 10 reps - Sidelying Hip Abduction  - 1 x daily - 7 x weekly - 1 sets - 10 reps - Standing Isometric Hip Abduction with Ball on Wall  - 1 x daily - 7 x weekly - 1 sets - 10 reps - Side Stepping with Resistance at Thighs  - 1 x daily - 7 x weekly - 1 sets - 10 reps - Hip Flexor Stretch at Edge of Bed  - 1 x daily - 7 x  weekly - 1 sets - 3 reps - 30 hold - Hip Flexor Stretch with Chair  - 1 x daily - 7 x weekly - 1 sets - 3 reps - 20 hold - ASSESSMENT:  CLINICAL IMPRESSION: Excellent compliance with HEP including core and LE strengthening.  Right  LE neural symptoms have improve significantly since start of care.  Lumber ROM is WNLS in all planes of motion.  FOTO outcome score has improved significantly.  She reports irritation in both hips today directly over trochanteric bursa left more than right.  Notable left > right hip abduction weakness.  She has responded well to DN combined with ES and is now able to tolerate lying prone.   She feels this has helped to manage her symptoms and allowed her to minimize medication usage.  The patient would benefit from a continuation of skilled PT for a further progression of strengthening and functional mobility on a tapered basis.  Will continue to update and promote independence in a HEP needed for a return to the highest functional level possible with ADLs.      OBJECTIVE IMPAIRMENTS: decreased activity tolerance, decreased ROM, decreased strength, increased fascial restrictions, impaired perceived functional ability, and pain.   ACTIVITY LIMITATIONS: sitting and standing  PARTICIPATION LIMITATIONS: driving, community activity, and occupation  PERSONAL FACTORS: 1 comorbidity: recent breast surgery for CA  are also affecting patient's functional outcome.   REHAB POTENTIAL: Good  CLINICAL DECISION MAKING: Stable/uncomplicated  EVALUATION COMPLEXITY: Low   GOALS: Goals reviewed with patient? Yes  SHORT TERM GOALS: Target date: 12/29/2021  The patient will demonstrate knowledge of basic self care strategies and exercises to promote healing   Baseline: Goal status: MET  2.  The patient will report a 40% improvement in pain levels with functional activities including sitting, driving, night time Baseline:  Goal status: MET  3.  Pain centralized 50% of the  time Baseline:  Goal status: MET      LONG TERM GOALS: Target date: 04/24/2022    The patient will be independent in a safe self progression of a home exercise program to promote further recovery of function   Baseline:  Goal status: ongoing  2.  The patient will report a 75% improvement in pain levels with functional activities which are currently difficult including sitting, riding in the car at night and with her period Baseline:  Goal status: ongoing 3.  The patient will have improved left hip strength to at least 4+/5 needed for standing, walking longer distances   Baseline:  Goal status: ongoing   4.  The patient will have improved FOTO score to   86%    indicating improved function with less pain  Baseline:  Goal status: ongoing   PLAN:  PT FREQUENCY: every 2 weeks  PT DURATION: 12 weeks  PLANNED INTERVENTIONS: Therapeutic exercises, Therapeutic activity, Neuromuscular re-education, Gait training, Patient/Family education, Self Care, Joint mobilization, Aquatic Therapy, Dry Needling, Spinal manipulation, Spinal mobilization, Cryotherapy, Moist heat, Taping, Ionotophoresis '4mg'$ /ml Dexamethasone, Manual therapy, and Re-evaluation.  PLAN FOR NEXT SESSION:  DN with added ES bil hips;  patient able to lie prone for DN to lumbar multifidi;   bil glute strengthening  Ruben Im, PT 01/30/22 11:38 AM Phone: [phone] Fax: [facility] [address] [place], [place] [address] Phone # [phone] Fax [phone]

## 2022-02-13 ENCOUNTER — Ambulatory Visit: Payer: 59 | Admitting: Physical Therapy

## 2022-02-13 DIAGNOSIS — M5431 Sciatica, right side: Secondary | ICD-10-CM | POA: Diagnosis not present

## 2022-02-13 DIAGNOSIS — M6281 Muscle weakness (generalized): Secondary | ICD-10-CM

## 2022-02-13 NOTE — Therapy (Signed)
OUTPATIENT PHYSICAL THERAPY TREATMENT NOTE   Patient Name: Kaitlin [name] MRN: [id number] DOB:March 03, 1975, 47 y.o., female Today's Date: 01/30/2022   PT End of Session - 02/13/22 1445     Visit Number 8    Number of Visits 15    Date for PT Re-Evaluation 04/24/22    Authorization Type 20 visit limit 5 used-  may get more visits start of year    Authorization - Number of Visits 15    PT Start Time 1445    PT Stop Time 1530    PT Time Calculation (min) 45 min    Activity Tolerance Patient tolerated treatment well                 Past Medical History:  Diagnosis Date   Allergy    Anemia    Asthma    Cardiomyopathy ([place])    Chronic headaches    Delivery normal 11/2008   Family history of breast cancer    Family history of colon cancer    Family history of leukemia    Family history of melanoma    Family history of ovarian cancer    Heart murmur    Kidney stone    x of   PONV (postoperative nausea and vomiting)    S/P tonsillectomy    2000   Past Surgical History:  Procedure Laterality Date   BREAST RECONSTRUCTION WITH PLACEMENT OF TISSUE EXPANDER AND ALLODERM Bilateral 12/06/2020   Procedure: BREAST RECONSTRUCTION WITH PLACEMENT OF TISSUE EXPANDER AND ALLODERM;  Surgeon: Irene Limbo, MD;  Location: [facility];  Service: Plastics;  Laterality: Bilateral;   LEEP     MASTECTOMY W/ SENTINEL NODE BIOPSY Right 12/06/2020   Procedure: RIGHT MASTECTOMY WITH RIGHT AXILLARY SENTINEL LYMPH NODE BIOPSY;  Surgeon: Rolm Bookbinder, MD;  Location: [facility];  Service: General;  Laterality: Right;   REMOVAL OF BILATERAL TISSUE EXPANDERS WITH PLACEMENT OF BILATERAL BREAST IMPLANTS Bilateral 09/15/2021   Procedure: REMOVAL OF BILATERAL TISSUE EXPANDERS WITH PLACEMENT OF BILATERAL BREAST IMPLANTS;  Surgeon: Irene Limbo, MD;  Location: [facility];  Service: Plastics;  Laterality: Bilateral;   tonsil removal  01/22/1998    TOTAL MASTECTOMY Left 12/06/2020   Procedure: LEFT TOTAL MASTECTOMY;  Surgeon: Rolm Bookbinder, MD;  Location: [facility];  Service: General;  Laterality: Left;   Patient Active Problem List   Diagnosis Date Noted   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   Malignant neoplasm of upper-outer quadrant of right breast in female, estrogen receptor positive ([place]) 12/12/2020   S/P bilateral mastectomy 12/06/2020   Genetic testing 11/17/2020   Family history of breast cancer 11/02/2020   Family history of ovarian cancer 11/02/2020   Family history of colon cancer 11/02/2020   Family history of melanoma 11/02/2020   Family history of leukemia 11/02/2020   Bilateral breast cancer ([place]) 11/01/2020   LLQ abdominal pain 04/06/2011    PCP: [name] MD  REFERRING PROVIDER: [name] MD  REFERRING DIAG: M54.30 sciatica;  D05.12 ductal carninoma in situ of left breast  Rationale for Evaluation and Treatment: Rehabilitation  THERAPY DIAG: sciatica  ONSET DATE: 10/31/21  SUBJECTIVE:  SUBJECTIVE STATEMENT: Right > left buttock pain 1-2/10 noticeable but low level.  TENS and heat.  Not impeding with stairs.  More at night.  Improvement over the last 2 weeks.   PERTINENT HISTORY:  Bil mastectomy with immediate reconstruction 12/06/20 with 1/3 LN positive.  Rt Grade 2 IDC with DCIS ER positive.  Lt breast DCIS intermediate, no LN. Radiation 01/27/21-03/14/2021 to Right chest.  Implants 09/15/21  Unable to lie prone secondary pain from recent breast surgery PAIN:  Are you having pain? Yes  NPRS scale: 1-2/10 Pain location:  bilateral low back pain;right/left buttock; Aggravating factors: period; sitting in car;  worse at night; standing Relieving factors: Tiger Balm    PRECAUTIONS: Other: no heat near left breast  WEIGHT BEARING RESTRICTIONS: No  FALLS:  Has patient fallen in last 6 months? 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)  PLOF: Independent  PATIENT GOALS: alleviate pain (I know that Tamoxifen could be causing joint pain)     OBJECTIVE:   DIAGNOSTIC FINDINGS:  CT chest in May and August  PATIENT SURVEYS:  Eval:  FOTO 67%  1/9: 74%   COGNITION: Overall cognitive status: Within functional limits for tasks assessed      POSTURE: No Significant postural limitations  PALPATION: Numerous tender points in gluteals, restriction in QL  LUMBAR ROM:   Repeated movement testing indicates a possible extension preference however unable to lie prone  AROM eval 1/9  Flexion Fingertips to toes To toes 82 degrees  Extension 15 30  Right lateral flexion 35 45  Left lateral flexion 35 45  Right rotation    Left rotation     (Blank rows = not tested)   LOWER EXTREMITY ROM:   WFLs  LOWER EXTREMITY MMT:  Decreased right hip abduction strength 4/5 1/9: left hip abduction 4/5, right 4+/5  LUMBAR SPECIAL TESTS:  Slump test: Negative  patient states she thinks this would have been painful to do yesterday 1/9: negative GAIT: Comments: normal  TODAY'S TREATMENT:      DATE: 02/13/22: Review of current ex program Side planks with clam Dead lifts 15# kettlebell emphasizing feet press into floor to rise Trigger Point Dry-Needling  Treatment instructions: Expect mild to moderate muscle soreness. S/S of pneumothorax if dry needled over a lung field, and to seek immediate medical attention should they occur. Patient verbalized understanding of these instructions and education. Patient Consent Given: Yes Education handout provided: Yes Muscles treated: bil lumbar multifidi marinate;  right and left glute/piriformis in prone Electrical stimulation performed: yes to bil multifid and gluteals bil Parameters:  2.5 ma 8 min muscle twitches produced bil with ES Treatment response/outcome: Utilized skilled palpation to identify trigger points.  Able to illicit twitch response and muscle elongation following. Manual Therapy:  Soft tissue mobilization to lumbar and glute/piriformis region       DATE: 01/30/22: FOTO Lumbar ROM MMT trunk and LE Anti-inflammatory strategies Review of current ex program Trigger Point Dry-Needling  Treatment instructions: Expect mild to moderate muscle soreness. S/S of pneumothorax if dry needled over a lung field, and to seek immediate medical attention should they occur. Patient verbalized understanding of these instructions and education. Patient Consent Given: Yes Education handout provided: Yes Muscles treated: bil lumbar multifidi marinate;  right and left glute/piriformis in prone; extra DN to left gluteals and piriformis today Electrical stimulation performed: yes to bil multifid and gluteals bil Parameters: 2.5 ma 8 min Treatment response/outcome: Utilized skilled palpation to  identify trigger points.  Able to illicit twitch response and muscle elongation following. Manual Therapy:  Soft tissue mobilization to lumbar and glute/piriformis region        DATE: 01/21/2022 Single leg dead lifts; staggered stance dead lifts 10x each Hip thrusts with X-heavy purple loop Review of current ex program Trigger Point Dry-Needling  Treatment instructions: Expect mild to moderate muscle soreness. S/S of pneumothorax if dry needled over a lung field, and to seek immediate medical attention should they occur. Patient verbalized understanding of these instructions and education. Patient Consent Given: Yes Education handout provided: Yes Muscles treated: bil lumbar multifidi marinate;  right and left glute/piriformis in prone Electrical stimulation performed: yes to bil multifid and gluteals bil Parameters: 2.5 ma 8 min Treatment response/outcome: Utilized skilled  palpation to identify trigger points.  Able to illicit twitch response and muscle elongation following. Manual Therapy:  Soft tissue mobilization to lumbar and glute/piriformis region         PATIENT EDUCATION:  Education details: plan of care;  DN after care handout Person educated: Patient Education method: Explanation Education comprehension: verbalized understanding  HOME EXERCISE PROGRAM: Access Code: [email or web address] URL: [email or web address] Date: 12/26/2021 Prepared by: Ruben Im  Exercises - Standing Lumbar Extension  - 1 x daily - 7 x weekly - 2 sets - 10 reps - Seated Piriformis Stretch  - 1 x daily - 7 x weekly - 2 sets - 2 reps - 20 sec hold - Seated Hamstring Stretch  - 1 x daily - 7 x weekly - 1 sets - 2 reps - 20 sec hold - Seated Sciatic Tensioner  - 1 x daily - 7 x weekly - 2 sets - 10 reps - Hooklying Single Knee to Chest Stretch  - 1 x daily - 7 x weekly - 1 sets - 2 reps - 20 sec hold - Supine Posterior Pelvic Tilt  - 1 x daily - 7 x weekly - 2 sets - 10 reps - Supine Lower Trunk Rotation  - 1 x daily - 7 x weekly - 2 sets - 5 reps - 10 sec hold - Clamshell  - 1 x daily - 7 x weekly - 2 sets - 10 reps - Seated Slump Nerve Glide  - 1 x daily - 7 x weekly - 1 sets - 10 reps - Clam with Resistance  - 1 x daily - 7 x weekly - 1 sets - 10 reps - Beginner Reverse Clamshell  - 1 x daily - 7 x weekly - 1 sets - 10 reps - Sidelying Hip Abduction  - 1 x daily - 7 x weekly - 1 sets - 10 reps - Standing Isometric Hip Abduction with Ball on Wall  - 1 x daily - 7 x weekly - 1 sets - 10 reps - Side Stepping with Resistance at Thighs  - 1 x daily - 7 x weekly - 1 sets - 10 reps - Hip Flexor Stretch at Edge of Bed  - 1 x daily - 7 x weekly - 1 sets - 3 reps - 30 hold - Hip Flexor Stretch with Chair  - 1 x daily - 7 x weekly - 1 sets - 3 reps - 20 hold - ASSESSMENT:  CLINICAL IMPRESSION: Patient remains highly compliant with HEP and reports a positive  correlation with exercise and less pain.  She responds well to DN combined with ES and able to produce multiple twitch responses which is a good prognostic indicator.  Much improved symptom  intensity and frequency of onset.        OBJECTIVE IMPAIRMENTS: decreased activity tolerance, decreased ROM, decreased strength, increased fascial restrictions, impaired perceived functional ability, and pain.   ACTIVITY LIMITATIONS: sitting and standing  PARTICIPATION LIMITATIONS: driving, community activity, and occupation  PERSONAL FACTORS: 1 comorbidity: recent breast surgery for CA  are also affecting patient's functional outcome.   REHAB POTENTIAL: Good  CLINICAL DECISION MAKING: Stable/uncomplicated  EVALUATION COMPLEXITY: Low   GOALS: Goals reviewed with patient? Yes  SHORT TERM GOALS: Target date: 12/29/2021  The patient will demonstrate knowledge of basic self care strategies and exercises to promote healing   Baseline: Goal status: MET  2.  The patient will report a 40% improvement in pain levels with functional activities including sitting, driving, night time Baseline:  Goal status: MET  3.  Pain centralized 50% of the time Baseline:  Goal status: MET      LONG TERM GOALS: Target date: 04/24/2022    The patient will be independent in a safe self progression of a home exercise program to promote further recovery of function   Baseline:  Goal status: ongoing  2.  The patient will report a 75% improvement in pain levels with functional activities which are currently difficult including sitting, riding in the car at night and with her period Baseline:  Goal status: ongoing 3.  The patient will have improved left hip strength to at least 4+/5 needed for standing, walking longer distances   Baseline:  Goal status: ongoing   4.  The patient will have improved FOTO score to   86%    indicating improved function with less pain  Baseline:  Goal status:  ongoing   PLAN:  PT FREQUENCY: every 2 weeks  PT DURATION: 12 weeks  PLANNED INTERVENTIONS: Therapeutic exercises, Therapeutic activity, Neuromuscular re-education, Gait training, Patient/Family education, Self Care, Joint mobilization, Aquatic Therapy, Dry Needling, Spinal manipulation, Spinal mobilization, Cryotherapy, Moist heat, Taping, Ionotophoresis '4mg'$ /ml Dexamethasone, Manual therapy, and Re-evaluation.  PLAN FOR NEXT SESSION:  DN with added ES bil hips;  patient able to lie prone for DN to lumbar multifidi;   bil glute strengthening  Ruben Im, PT 02/13/22 8:04 PM Phone: [phone] Fax: [facility] [address], [address] [place], [place] [address] Phone # [phone] Fax [phone]

## 2022-02-27 ENCOUNTER — Ambulatory Visit: Payer: 59 | Attending: Hematology and Oncology | Admitting: Physical Therapy

## 2022-02-27 ENCOUNTER — Encounter: Payer: 59 | Admitting: Physical Therapy

## 2022-02-27 DIAGNOSIS — M6281 Muscle weakness (generalized): Secondary | ICD-10-CM | POA: Insufficient documentation

## 2022-02-27 DIAGNOSIS — M5431 Sciatica, right side: Secondary | ICD-10-CM | POA: Insufficient documentation

## 2022-02-27 NOTE — Therapy (Signed)
OUTPATIENT PHYSICAL THERAPY TREATMENT NOTE   Patient Name: Kaitlin [name] MRN: [id number] DOB:[birth year], 47 y.o., female Today's Date: 02/27/2022   PT End of Session - 02/27/22 1356     Visit Number 9    Number of Visits 15    Date for PT Re-Evaluation 04/24/22    Authorization Type 20 visit limit 5 used-  may get more visits start of year    PT Start Time 1400    PT Stop Time 1440    PT Time Calculation (min) 40 min    Activity Tolerance Patient tolerated treatment well                 Past Medical History:  Diagnosis Date   Allergy    Anemia    Asthma    Cardiomyopathy ([place])    Chronic headaches    Delivery normal 11/2008   Family history of breast cancer    Family history of colon cancer    Family history of leukemia    Family history of melanoma    Family history of ovarian cancer    Heart murmur    Kidney stone    x of   PONV (postoperative nausea and vomiting)    S/P tonsillectomy    2000   Past Surgical History:  Procedure Laterality Date   BREAST RECONSTRUCTION WITH PLACEMENT OF TISSUE EXPANDER AND ALLODERM Bilateral 12/06/2020   Procedure: BREAST RECONSTRUCTION WITH PLACEMENT OF TISSUE EXPANDER AND ALLODERM;  Surgeon: Irene Limbo, MD;  Location: [facility];  Service: Plastics;  Laterality: Bilateral;   LEEP     MASTECTOMY W/ SENTINEL NODE BIOPSY Right 12/06/2020   Procedure: RIGHT MASTECTOMY WITH RIGHT AXILLARY SENTINEL LYMPH NODE BIOPSY;  Surgeon: Rolm Bookbinder, MD;  Location: [facility];  Service: General;  Laterality: Right;   REMOVAL OF BILATERAL TISSUE EXPANDERS WITH PLACEMENT OF BILATERAL BREAST IMPLANTS Bilateral 09/15/2021   Procedure: REMOVAL OF BILATERAL TISSUE EXPANDERS WITH PLACEMENT OF BILATERAL BREAST IMPLANTS;  Surgeon: Irene Limbo, MD;  Location: [facility];  Service: Plastics;  Laterality: Bilateral;   tonsil removal  01/22/1998   TOTAL MASTECTOMY Left 12/06/2020    Procedure: LEFT TOTAL MASTECTOMY;  Surgeon: Rolm Bookbinder, MD;  Location: [facility];  Service: General;  Laterality: Left;   Patient Active Problem List   Diagnosis Date Noted   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   Malignant neoplasm of upper-outer quadrant of right breast in female, estrogen receptor positive ([place]) 12/12/2020   S/P bilateral mastectomy 12/06/2020   Genetic testing 11/17/2020   Family history of breast cancer 11/02/2020   Family history of ovarian cancer 11/02/2020   Family history of colon cancer 11/02/2020   Family history of melanoma 11/02/2020   Family history of leukemia 11/02/2020   Bilateral breast cancer ([place]) 11/01/2020   LLQ abdominal pain 04/06/2011    PCP: [name] MD  REFERRING PROVIDER: [name] MD  REFERRING DIAG: M54.30 sciatica;  D05.12 ductal carninoma in situ of left breast  Rationale for Evaluation and Treatment: Rehabilitation  THERAPY DIAG: sciatica  ONSET DATE: 10/31/21  SUBJECTIVE:  SUBJECTIVE STATEMENT: I did amazingly well after last time. Sore for 2 days but then a great 2 weeks. I haven't had to use the TENs as much.  Right buttock mild pain.  No left buttock pain.  I bought a grounding mat.    PERTINENT HISTORY:  Bil mastectomy with immediate reconstruction 12/06/20 with 1/3 LN positive.  Rt Grade 2 IDC with DCIS ER positive.  Lt breast DCIS intermediate, no LN. Radiation 01/27/21-03/14/2021 to Right chest.  Implants 09/15/21  Unable to lie prone secondary pain from recent breast surgery PAIN:  Are you having pain? Yes  NPRS scale: 1-2/10 Pain location:  bilateral low back pain;right/left buttock; Aggravating factors: period; sitting in car;  worse at night; standing Relieving factors: Tiger Balm    PRECAUTIONS: Other: no heat near left breast  WEIGHT BEARING RESTRICTIONS: No  FALLS:  Has patient fallen in last 6 months? 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)  PLOF: Independent  PATIENT GOALS: alleviate pain (I know that Tamoxifen could be causing joint pain)     OBJECTIVE:   DIAGNOSTIC FINDINGS:  CT chest in May and August  PATIENT SURVEYS:  Eval:  FOTO 67%  1/9: 74%   COGNITION: Overall cognitive status: Within functional limits for tasks assessed      POSTURE: No Significant postural limitations  PALPATION: Numerous tender points in gluteals, restriction in QL  LUMBAR ROM:   Repeated movement testing indicates a possible extension preference however unable to lie prone  AROM eval 1/9 2/6  Flexion Fingertips to toes To toes 82 degrees full  Extension 15 30 full  Right lateral flexion 35 45 full  Left lateral flexion 35 45 full  Right rotation     Left rotation      (Blank rows = not tested)   LOWER EXTREMITY ROM:   WFLs  LOWER EXTREMITY MMT:  Decreased right hip abduction strength 4/5 1/9: left hip abduction 4/5, right 4+/5   LUMBAR SPECIAL TESTS:  Slump test: Negative  patient states she thinks this would have been painful to do yesterday 1/9: negative GAIT: Comments: normal  TODAY'S TREATMENT:      DATE: 02/27/22: Review of HEP including side planks Trigger Point Dry-Needling  Treatment instructions: Expect mild to moderate muscle soreness. S/S of pneumothorax if dry needled over a lung field, and to seek immediate medical attention should they occur. Patient verbalized understanding of these instructions and education. Patient Consent Given: Yes Education handout provided: Yes Muscles treated: bil lumbar multifidi marinate;  right and left glute/piriformis in prone Electrical stimulation performed: yes to bil multifid and gluteals bil Parameters: 2.5 ma 8 min muscle twitches produced bil with  ES Treatment response/outcome: Utilized skilled palpation to identify trigger points.  Able to illicit twitch response and muscle elongation following. Manual Therapy:  Soft tissue mobilization to lumbar and glute/piriformis region          DATE: 02/13/22: Review of current ex program Side planks with clam Dead lifts 15# kettlebell emphasizing feet press into floor to rise Trigger Point Dry-Needling  Treatment instructions: Expect mild to moderate muscle soreness. S/S of pneumothorax if dry needled over a lung field, and to seek immediate medical attention should they occur. Patient verbalized understanding of these instructions and education. Patient Consent Given: Yes Education handout provided: Yes Muscles treated: bil lumbar multifidi marinate;  right and left glute/piriformis in prone Electrical stimulation performed: yes to bil multifid and gluteals bil Parameters: 2.5 ma  8 min muscle twitches produced bil with ES Treatment response/outcome: Utilized skilled palpation to identify trigger points.  Able to illicit twitch response and muscle elongation following. Manual Therapy:  Soft tissue mobilization to lumbar and glute/piriformis region       DATE: 01/30/22: FOTO Lumbar ROM MMT trunk and LE Anti-inflammatory strategies Review of current ex program Trigger Point Dry-Needling  Treatment instructions: Expect mild to moderate muscle soreness. S/S of pneumothorax if dry needled over a lung field, and to seek immediate medical attention should they occur. Patient verbalized understanding of these instructions and education. Patient Consent Given: Yes Education handout provided: Yes Muscles treated: bil lumbar multifidi marinate;  right and left glute/piriformis in prone; extra DN to left gluteals and piriformis today Electrical stimulation performed: yes to bil multifid and gluteals bil Parameters: 2.5 ma 8 min Treatment response/outcome: Utilized skilled palpation to  identify trigger points.  Able to illicit twitch response and muscle elongation following. Manual Therapy:  Soft tissue mobilization to lumbar and glute/piriformis region        PATIENT EDUCATION:  Education details: plan of care;  DN after care handout Person educated: Patient Education method: Explanation Education comprehension: verbalized understanding  HOME EXERCISE PROGRAM: Access Code: [email or web address] URL: [email or web address] Date: 12/26/2021 Prepared by: Ruben Im  Exercises - Standing Lumbar Extension  - 1 x daily - 7 x weekly - 2 sets - 10 reps - Seated Piriformis Stretch  - 1 x daily - 7 x weekly - 2 sets - 2 reps - 20 sec hold - Seated Hamstring Stretch  - 1 x daily - 7 x weekly - 1 sets - 2 reps - 20 sec hold - Seated Sciatic Tensioner  - 1 x daily - 7 x weekly - 2 sets - 10 reps - Hooklying Single Knee to Chest Stretch  - 1 x daily - 7 x weekly - 1 sets - 2 reps - 20 sec hold - Supine Posterior Pelvic Tilt  - 1 x daily - 7 x weekly - 2 sets - 10 reps - Supine Lower Trunk Rotation  - 1 x daily - 7 x weekly - 2 sets - 5 reps - 10 sec hold - Clamshell  - 1 x daily - 7 x weekly - 2 sets - 10 reps - Seated Slump Nerve Glide  - 1 x daily - 7 x weekly - 1 sets - 10 reps - Clam with Resistance  - 1 x daily - 7 x weekly - 1 sets - 10 reps - Beginner Reverse Clamshell  - 1 x daily - 7 x weekly - 1 sets - 10 reps - Sidelying Hip Abduction  - 1 x daily - 7 x weekly - 1 sets - 10 reps - Standing Isometric Hip Abduction with Ball on Wall  - 1 x daily - 7 x weekly - 1 sets - 10 reps - Side Stepping with Resistance at Thighs  - 1 x daily - 7 x weekly - 1 sets - 10 reps - Hip Flexor Stretch at Edge of Bed  - 1 x daily - 7 x weekly - 1 sets - 3 reps - 30 hold - Hip Flexor Stretch with Chair  - 1 x daily - 7 x weekly - 1 sets - 3 reps - 20 hold - ASSESSMENT:  CLINICAL IMPRESSION: Considerable improvement in mobility, symptom reduction and return to ADLs since start of  care.  The patient is highly compliant with her HEP which adds to the benefit of  DN.  The patient had numerous muscle twitches produced during dry needling which is a good prognostic indicator. Will continue to taper visits to promote independence with self management.         OBJECTIVE IMPAIRMENTS: decreased activity tolerance, decreased ROM, decreased strength, increased fascial restrictions, impaired perceived functional ability, and pain.   ACTIVITY LIMITATIONS: sitting and standing  PARTICIPATION LIMITATIONS: driving, community activity, and occupation  PERSONAL FACTORS: 1 comorbidity: recent breast surgery for CA  are also affecting patient's functional outcome.   REHAB POTENTIAL: Good  CLINICAL DECISION MAKING: Stable/uncomplicated  EVALUATION COMPLEXITY: Low   GOALS: Goals reviewed with patient? Yes  SHORT TERM GOALS: Target date: 12/29/2021  The patient will demonstrate knowledge of basic self care strategies and exercises to promote healing   Baseline: Goal status: MET  2.  The patient will report a 40% improvement in pain levels with functional activities including sitting, driving, night time Baseline:  Goal status: MET  3.  Pain centralized 50% of the time Baseline:  Goal status: MET      LONG TERM GOALS: Target date: 04/24/2022    The patient will be independent in a safe self progression of a home exercise program to promote further recovery of function   Baseline:  Goal status: ongoing  2.  The patient will report a 75% improvement in pain levels with functional activities which are currently difficult including sitting, riding in the car at night and with her period Baseline:  Goal status: ongoing 3.  The patient will have improved left hip strength to at least 4+/5 needed for standing, walking longer distances   Baseline:  Goal status: ongoing   4.  The patient will have improved FOTO score to   86%    indicating improved function with less pain   Baseline:  Goal status: ongoing   PLAN:  PT FREQUENCY: every 2 weeks  PT DURATION: 12 weeks  PLANNED INTERVENTIONS: Therapeutic exercises, Therapeutic activity, Neuromuscular re-education, Gait training, Patient/Family education, Self Care, Joint mobilization, Aquatic Therapy, Dry Needling, Spinal manipulation, Spinal mobilization, Cryotherapy, Moist heat, Taping, Ionotophoresis '4mg'$ /ml Dexamethasone, Manual therapy, and Re-evaluation.  PLAN FOR NEXT SESSION:  recheck strength;  DN with added ES bil hips and bil lumbar multifidi;   bil glute strengthening  Ruben Im, PT 02/27/22 3:19 PM Phone: [phone] Fax: [phone]  [facility] [address] [place] [address] [place], [place] [address] Phone # [phone] Fax [phone]

## 2022-03-13 ENCOUNTER — Ambulatory Visit: Payer: 59 | Admitting: Physical Therapy

## 2022-03-13 DIAGNOSIS — M6281 Muscle weakness (generalized): Secondary | ICD-10-CM

## 2022-03-13 DIAGNOSIS — M5431 Sciatica, right side: Secondary | ICD-10-CM | POA: Diagnosis not present

## 2022-03-13 NOTE — Therapy (Signed)
OUTPATIENT PHYSICAL THERAPY TREATMENT NOTE   Patient Name: Kaitlin [name] MRN: [id number] DOB:1975-10-17, 47 y.o., female Today's Date: 03/13/2022   PT End of Session - 03/13/22 0948     Visit Number 10    Number of Visits 15    Date for PT Re-Evaluation 04/24/22    Authorization Type 20 visit limit 5 used-  may get more visits start of year    Authorization - Number of Visits 15    PT Start Time 0930    PT Stop Time 1015    PT Time Calculation (min) 45 min    Activity Tolerance Patient tolerated treatment well                 Past Medical History:  Diagnosis Date   Allergy    Anemia    Asthma    Cardiomyopathy ([place])    Chronic headaches    Delivery normal 11/2008   Family history of breast cancer    Family history of colon cancer    Family history of leukemia    Family history of melanoma    Family history of ovarian cancer    Heart murmur    Kidney stone    x of   PONV (postoperative nausea and vomiting)    S/P tonsillectomy    2000   Past Surgical History:  Procedure Laterality Date   BREAST RECONSTRUCTION WITH PLACEMENT OF TISSUE EXPANDER AND ALLODERM Bilateral 12/06/2020   Procedure: BREAST RECONSTRUCTION WITH PLACEMENT OF TISSUE EXPANDER AND ALLODERM;  Surgeon: Irene Limbo, MD;  Location: [facility];  Service: Plastics;  Laterality: Bilateral;   LEEP     MASTECTOMY W/ SENTINEL NODE BIOPSY Right 12/06/2020   Procedure: RIGHT MASTECTOMY WITH RIGHT AXILLARY SENTINEL LYMPH NODE BIOPSY;  Surgeon: Rolm Bookbinder, MD;  Location: [facility];  Service: General;  Laterality: Right;   REMOVAL OF BILATERAL TISSUE EXPANDERS WITH PLACEMENT OF BILATERAL BREAST IMPLANTS Bilateral 09/15/2021   Procedure: REMOVAL OF BILATERAL TISSUE EXPANDERS WITH PLACEMENT OF BILATERAL BREAST IMPLANTS;  Surgeon: Irene Limbo, MD;  Location: [facility];  Service: Plastics;  Laterality: Bilateral;   tonsil removal  01/22/1998    TOTAL MASTECTOMY Left 12/06/2020   Procedure: LEFT TOTAL MASTECTOMY;  Surgeon: Rolm Bookbinder, MD;  Location: [facility];  Service: General;  Laterality: Left;   Patient Active Problem List   Diagnosis Date Noted   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   Malignant neoplasm of upper-outer quadrant of right breast in female, estrogen receptor positive ([place]) 12/12/2020   S/P bilateral mastectomy 12/06/2020   Genetic testing 11/17/2020   Family history of breast cancer 11/02/2020   Family history of ovarian cancer 11/02/2020   Family history of colon cancer 11/02/2020   Family history of melanoma 11/02/2020   Family history of leukemia 11/02/2020   Bilateral breast cancer ([place]) 11/01/2020   LLQ abdominal pain 04/06/2011    PCP: [name] MD  REFERRING PROVIDER: [name] MD  REFERRING DIAG: M54.30 sciatica;  D05.12 ductal carninoma in situ of left breast  Rationale for Evaluation and Treatment: Rehabilitation  THERAPY DIAG: sciatica  ONSET DATE: 10/31/21  SUBJECTIVE:  SUBJECTIVE STATEMENT: Mild right buttock pain.    I increased my upper body weights from 5# to 8#.  I might have slept wrong.  Feeling it in my neck sides and even some in the front of my neck.  It makes me worry.    PERTINENT HISTORY:  Bil mastectomy with immediate reconstruction 12/06/20 with 1/3 LN positive.  Rt Grade 2 IDC with DCIS ER positive.  Lt breast DCIS intermediate, no LN. Radiation 01/27/21-03/14/2021 to Right chest.  Implants 09/15/21  Unable to lie prone secondary pain from recent breast surgery PAIN:  Are you having pain? Yes  NPRS scale: 1-2/10 Pain location:  right buttock;  neck/upper traps Aggravating factors: period; sitting in car;  worse at night; standing Relieving  factors: Tiger Balm   PRECAUTIONS: Other: no heat near left breast  WEIGHT BEARING RESTRICTIONS: No  FALLS:  Has patient fallen in last 6 months? 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)  PLOF: Independent  PATIENT GOALS: alleviate pain (I know that Tamoxifen could be causing joint pain)     OBJECTIVE:   DIAGNOSTIC FINDINGS:  CT chest in May and August  PATIENT SURVEYS:  Eval:  FOTO 67%  1/9: 74%   COGNITION: Overall cognitive status: Within functional limits for tasks assessed      POSTURE: No Significant postural limitations  PALPATION: Numerous tender points in gluteals, restriction in QL  LUMBAR ROM:   Repeated movement testing indicates a possible extension preference however unable to lie prone  AROM eval 1/9 2/6  Flexion Fingertips to toes To toes 82 degrees full  Extension 15 30 full  Right lateral flexion 35 45 full  Left lateral flexion 35 45 full  Right rotation     Left rotation      (Blank rows = not tested)   LOWER EXTREMITY ROM:   WFLs  LOWER EXTREMITY MMT:  Decreased right hip abduction strength 4/5 1/9: left hip abduction 4/5, right 4+/5 2/20:  bil hip abduction 4+/5 to 5-/5;  trunk extensors 4+/5; trunk flexors 4/5   LUMBAR SPECIAL TESTS:  Slump test: Negative  patient states she thinks this would have been painful to do yesterday 1/9: negative GAIT: Comments: normal  TODAY'S TREATMENT:      DATE: 03/13/22: Discussion of weight progression/increase and determined 8# lateral raises my have been the culprit for her new onset upper trap/SCM/scalene region pain; discussed 5# for these and a slower progression, fewer reps Upper traps and levator stretches Discussion of meditation/mindfulness or deep breathing to decrease upper trap overactivation Trigger Point Dry-Needling  Treatment instructions: Expect mild to moderate muscle soreness. S/S of pneumothorax if dry needled over a lung field, and to seek  immediate medical attention should they occur. Patient verbalized understanding of these instructions and education. Patient Consent Given: Yes Education handout provided: Yes Muscles treated:  glute/piriformis in sidelying Electrical stimulation performed: no Parameters: n/a Treatment response/outcome: Utilized skilled palpation to identify trigger points.  Able to illicit twitch response and muscle elongation following. Manual Therapy:  Soft tissue mobilization to glute/piriformis region        DATE: 02/27/22: Review of HEP including side planks Trigger Point Dry-Needling  Treatment instructions: Expect mild to moderate muscle soreness. S/S of pneumothorax if dry needled over a lung field, and to seek immediate medical attention should they occur. Patient verbalized understanding of these instructions and education. Patient Consent Given: Yes Education handout provided: Yes Muscles treated: bil lumbar multifidi marinate;  right and left glute/piriformis in prone Electrical stimulation performed: yes to bil multifid and gluteals bil Parameters: 2.5 ma 8 min muscle twitches produced bil with ES Treatment response/outcome: Utilized skilled palpation to identify trigger points.  Able to illicit twitch response and muscle elongation following. Manual Therapy:  Soft tissue mobilization to lumbar and glute/piriformis region          DATE: 02/13/22: Review of current ex program Side planks with clam Dead lifts 15# kettlebell emphasizing feet press into floor to rise Trigger Point Dry-Needling  Treatment instructions: Expect mild to moderate muscle soreness. S/S of pneumothorax if dry needled over a lung field, and to seek immediate medical attention should they occur. Patient verbalized understanding of these instructions and education. Patient Consent Given: Yes Education handout provided: Yes Muscles treated: bil lumbar multifidi marinate;  right and left glute/piriformis in  prone Electrical stimulation performed: yes to bil multifid and gluteals bil Parameters: 2.5 ma 8 min muscle twitches produced bil with ES Treatment response/outcome: Utilized skilled palpation to identify trigger points.  Able to illicit twitch response and muscle elongation following. Manual Therapy:  Soft tissue mobilization to lumbar and glute/piriformis region     PATIENT EDUCATION:  Education details: plan of care;  DN after care handout Person educated: Patient Education method: Explanation Education comprehension: verbalized understanding  HOME EXERCISE PROGRAM: Access Code: [email or web address] URL: [email or web address] Date: 12/26/2021 Prepared by: Ruben Im  Exercises - Standing Lumbar Extension  - 1 x daily - 7 x weekly - 2 sets - 10 reps - Seated Piriformis Stretch  - 1 x daily - 7 x weekly - 2 sets - 2 reps - 20 sec hold - Seated Hamstring Stretch  - 1 x daily - 7 x weekly - 1 sets - 2 reps - 20 sec hold - Seated Sciatic Tensioner  - 1 x daily - 7 x weekly - 2 sets - 10 reps - Hooklying Single Knee to Chest Stretch  - 1 x daily - 7 x weekly - 1 sets - 2 reps - 20 sec hold - Supine Posterior Pelvic Tilt  - 1 x daily - 7 x weekly - 2 sets - 10 reps - Supine Lower Trunk Rotation  - 1 x daily - 7 x weekly - 2 sets - 5 reps - 10 sec hold - Clamshell  - 1 x daily - 7 x weekly - 2 sets - 10 reps - Seated Slump Nerve Glide  - 1 x daily - 7 x weekly - 1 sets - 10 reps - Clam with Resistance  - 1 x daily - 7 x weekly - 1 sets - 10 reps - Beginner Reverse Clamshell  - 1 x daily - 7 x weekly - 1 sets - 10 reps - Sidelying Hip Abduction  - 1 x daily - 7 x weekly - 1 sets - 10 reps - Standing Isometric Hip Abduction with Ball on Wall  - 1 x daily - 7 x weekly - 1 sets - 10 reps - Side Stepping with Resistance at Thighs  - 1 x daily - 7 x weekly - 1 sets - 10 reps - Hip Flexor Stretch at Edge of Bed  - 1 x daily - 7 x weekly - 1 sets - 3 reps - 30 hold - Hip Flexor Stretch  with Chair  - 1 x daily - 7 x weekly - 1 sets - 3 reps - 20 hold - ASSESSMENT:  CLINICAL IMPRESSION: Lumbar and buttock pain continues to  improve in pain level, frequency of occurrence and is generally well-controlled with exercise.  Fewer tender points noted which respond well to DN and manual therapy.  She is concerned about new onset of neck pain which could be attributed to an increase in load with challenging deltoid lateral raises.  Suspect the load may have been a bit much causing strain to her upper traps and SCM/scalene muscles to compensate.   Will continue to taper visits to promote independence with self management.         OBJECTIVE IMPAIRMENTS: decreased activity tolerance, decreased ROM, decreased strength, increased fascial restrictions, impaired perceived functional ability, and pain.   ACTIVITY LIMITATIONS: sitting and standing  PARTICIPATION LIMITATIONS: driving, community activity, and occupation  PERSONAL FACTORS: 1 comorbidity: recent breast surgery for CA  are also affecting patient's functional outcome.   REHAB POTENTIAL: Good  CLINICAL DECISION MAKING: Stable/uncomplicated  EVALUATION COMPLEXITY: Low   GOALS: Goals reviewed with patient? Yes  SHORT TERM GOALS: Target date: 12/29/2021  The patient will demonstrate knowledge of basic self care strategies and exercises to promote healing   Baseline: Goal status: MET  2.  The patient will report a 40% improvement in pain levels with functional activities including sitting, driving, night time Baseline:  Goal status: MET  3.  Pain centralized 50% of the time Baseline:  Goal status: MET      LONG TERM GOALS: Target date: 04/24/2022    The patient will be independent in a safe self progression of a home exercise program to promote further recovery of function   Baseline:  Goal status: ongoing  2.  The patient will report a 75% improvement in pain levels with functional activities which are currently  difficult including sitting, riding in the car at night and with her period Baseline:  Goal status: ongoing 3.  The patient will have improved left hip strength to at least 4+/5 needed for standing, walking longer distances   Baseline:  Goal status: met 2/20   4.  The patient will have improved FOTO score to   86%    indicating improved function with less pain  Baseline:  Goal status: ongoing   PLAN:  PT FREQUENCY: every 2 weeks  PT DURATION: 12 weeks  PLANNED INTERVENTIONS: Therapeutic exercises, Therapeutic activity, Neuromuscular re-education, Gait training, Patient/Family education, Self Care, Joint mobilization, Aquatic Therapy, Dry Needling, Spinal manipulation, Spinal mobilization, Cryotherapy, Moist heat, Taping, Ionotophoresis 45m/ml Dexamethasone, Manual therapy, and Re-evaluation.  PLAN FOR NEXT SESSION:    DN with added ES bil hips and bil lumbar multifidi;   bil glute strengthening  SRuben Im PT 03/13/22 5:38 PM Phone: [phone]: [phone] [id number] [place] [place] [address]Phone # [phone] [phone]

## 2022-04-04 ENCOUNTER — Ambulatory Visit: Payer: 59 | Attending: Hematology and Oncology | Admitting: Physical Therapy

## 2022-04-04 DIAGNOSIS — M5431 Sciatica, right side: Secondary | ICD-10-CM | POA: Insufficient documentation

## 2022-04-04 DIAGNOSIS — M6281 Muscle weakness (generalized): Secondary | ICD-10-CM | POA: Insufficient documentation

## 2022-04-04 NOTE — Therapy (Signed)
OUTPATIENT PHYSICAL THERAPY TREATMENT NOTE   Patient Name: Kaitlin [name] MRN: [id number] DOB:1975/08/18, 47 y.o., female Today's Date: 04/04/2022   PT End of Session - 04/04/22 1148     Visit Number 11    Date for PT Re-Evaluation 04/24/22    Authorization Type 20 visit limit 5 used-  may get more visits start of year    PT Start Time 1146    PT Stop Time 1230    PT Time Calculation (min) 44 min    Activity Tolerance Patient tolerated treatment well                 Past Medical History:  Diagnosis Date   Allergy    Anemia    Asthma    Cardiomyopathy ([place])    Chronic headaches    Delivery normal 11/2008   Family history of breast cancer    Family history of colon cancer    Family history of leukemia    Family history of melanoma    Family history of ovarian cancer    Heart murmur    Kidney stone    x of   PONV (postoperative nausea and vomiting)    S/P tonsillectomy    2000   Past Surgical History:  Procedure Laterality Date   BREAST RECONSTRUCTION WITH PLACEMENT OF TISSUE EXPANDER AND ALLODERM Bilateral 12/06/2020   Procedure: BREAST RECONSTRUCTION WITH PLACEMENT OF TISSUE EXPANDER AND ALLODERM;  Surgeon: Irene Limbo, MD;  Location: [facility];  Service: Plastics;  Laterality: Bilateral;   LEEP     MASTECTOMY W/ SENTINEL NODE BIOPSY Right 12/06/2020   Procedure: RIGHT MASTECTOMY WITH RIGHT AXILLARY SENTINEL LYMPH NODE BIOPSY;  Surgeon: Rolm Bookbinder, MD;  Location: [facility];  Service: General;  Laterality: Right;   REMOVAL OF BILATERAL TISSUE EXPANDERS WITH PLACEMENT OF BILATERAL BREAST IMPLANTS Bilateral 09/15/2021   Procedure: REMOVAL OF BILATERAL TISSUE EXPANDERS WITH PLACEMENT OF BILATERAL BREAST IMPLANTS;  Surgeon: Irene Limbo, MD;  Location: [facility];  Service: Plastics;  Laterality: Bilateral;   tonsil removal  01/22/1998   TOTAL MASTECTOMY Left 12/06/2020   Procedure: LEFT TOTAL  MASTECTOMY;  Surgeon: Rolm Bookbinder, MD;  Location: [facility];  Service: General;  Laterality: Left;   Patient Active Problem List   Diagnosis Date Noted   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   Malignant neoplasm of upper-outer quadrant of right breast in female, estrogen receptor positive ([place]) 12/12/2020   S/P bilateral mastectomy 12/06/2020   Genetic testing 11/17/2020   Family history of breast cancer 11/02/2020   Family history of ovarian cancer 11/02/2020   Family history of colon cancer 11/02/2020   Family history of melanoma 11/02/2020   Family history of leukemia 11/02/2020   Bilateral breast cancer ([place]) 11/01/2020   LLQ abdominal pain 04/06/2011    PCP: [name] MD  REFERRING PROVIDER: [name] MD  REFERRING DIAG: M54.30 sciatica;  D05.12 ductal carninoma in situ of left breast  Rationale for Evaluation and Treatment: Rehabilitation  THERAPY DIAG: sciatica  ONSET DATE: 10/31/21  SUBJECTIVE:  SUBJECTIVE STATEMENT: I'm OK. I'm taking Magnesium glycinate 3 tablets.  I can sleep on my hips now.  Waiting for test results.  I've been a mess.  I've been going to the chiro to work on neck adjustments acupuncture.    PERTINENT HISTORY:  Bil mastectomy with immediate reconstruction 12/06/20 with 1/3 LN positive.  Rt Grade 2 IDC with DCIS ER positive.  Lt breast DCIS intermediate, no LN. Radiation 01/27/21-03/14/2021 to Right chest.  Implants 09/15/21  Unable to lie prone secondary pain from recent breast surgery PAIN:  Are you having pain? Yes  NPRS scale: 1-2/10 Pain location:  right buttock;  neck/upper traps Aggravating factors: period; sitting in car;  worse at night; standing Relieving factors: Tiger Balm   PRECAUTIONS: Other: no heat  near left breast  WEIGHT BEARING RESTRICTIONS: No  FALLS:  Has patient fallen in last 6 months? 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)  PLOF: Independent  PATIENT GOALS: alleviate pain (I know that Tamoxifen could be causing joint pain)     OBJECTIVE:   DIAGNOSTIC FINDINGS:  CT chest in May and August  PATIENT SURVEYS:  Eval:  FOTO 67%  1/9: 74%   COGNITION: Overall cognitive status: Within functional limits for tasks assessed      POSTURE: No Significant postural limitations  PALPATION: Numerous tender points in gluteals, restriction in QL  LUMBAR ROM:   Repeated movement testing indicates a possible extension preference however unable to lie prone  AROM eval 1/9 2/6  Flexion Fingertips to toes To toes 82 degrees full  Extension 15 30 full  Right lateral flexion 35 45 full  Left lateral flexion 35 45 full  Right rotation     Left rotation      (Blank rows = not tested)   LOWER EXTREMITY ROM:   WFLs  LOWER EXTREMITY MMT:  Decreased right hip abduction strength 4/5 1/9: left hip abduction 4/5, right 4+/5 2/20:  bil hip abduction 4+/5 to 5-/5;  trunk extensors 4+/5; trunk flexors 4/5   LUMBAR SPECIAL TESTS:  Slump test: Negative  patient states she thinks this would have been painful to do yesterday 1/9: negative GAIT: Comments: normal  TODAY'S TREATMENT:      DATE: 04/04/22: Discussion of current ex program Trigger Point Dry-Needling  Treatment instructions: Expect mild to moderate muscle soreness. S/S of pneumothorax if dry needled over a lung field, and to seek immediate medical attention should they occur. Patient verbalized understanding of these instructions and education. Patient Consent Given: Yes Education handout provided: Yes Muscles treated:  bil lumbar multifidi; glute/piriformis in prone bil Electrical stimulation performed: yes Parameters: 1.5 ma bil multifidi and bil gluteals 13mn Treatment  response/outcome: Utilized skilled palpation to identify trigger points.  Able to illicit twitch response and muscle elongation following. Manual Therapy:  Soft tissue mobilization to glute/piriformis region            DATE: 03/13/22: Discussion of weight progression/increase and determined 8# lateral raises my have been the culprit for her new onset upper trap/SCM/scalene region pain; discussed 5# for these and a slower progression, fewer reps Upper traps and levator stretches Discussion of meditation/mindfulness or deep breathing to decrease upper trap overactivation Trigger Point Dry-Needling  Treatment instructions: Expect mild to moderate muscle soreness. S/S of pneumothorax if dry needled over a lung field, and to seek immediate medical attention should they occur. Patient verbalized understanding of these instructions and education. Patient Consent Given: Yes Education handout  provided: Yes Muscles treated:  glute/piriformis in sidelying Electrical stimulation performed: no Parameters: n/a Treatment response/outcome: Utilized skilled palpation to identify trigger points.  Able to illicit twitch response and muscle elongation following. Manual Therapy:  Soft tissue mobilization to glute/piriformis region        DATE: 02/27/22: Review of HEP including side planks Trigger Point Dry-Needling  Treatment instructions: Expect mild to moderate muscle soreness. S/S of pneumothorax if dry needled over a lung field, and to seek immediate medical attention should they occur. Patient verbalized understanding of these instructions and education. Patient Consent Given: Yes Education handout provided: Yes Muscles treated: bil lumbar multifidi marinate;  right and left glute/piriformis in prone Electrical stimulation performed: yes to bil multifid and gluteals bil Parameters: 2.5 ma 8 min muscle twitches produced bil with ES Treatment response/outcome: Utilized skilled palpation to  identify trigger points.  Able to illicit twitch response and muscle elongation following. Manual Therapy:  Soft tissue mobilization to lumbar and glute/piriformis region          PATIENT EDUCATION:  Education details: plan of care;  DN after care handout Person educated: Patient Education method: Explanation Education comprehension: verbalized understanding  HOME EXERCISE PROGRAM: Access Code: [email or web address] URL: [email or web address] Date: 12/26/2021 Prepared by: Ruben Im  Exercises - Standing Lumbar Extension  - 1 x daily - 7 x weekly - 2 sets - 10 reps - Seated Piriformis Stretch  - 1 x daily - 7 x weekly - 2 sets - 2 reps - 20 sec hold - Seated Hamstring Stretch  - 1 x daily - 7 x weekly - 1 sets - 2 reps - 20 sec hold - Seated Sciatic Tensioner  - 1 x daily - 7 x weekly - 2 sets - 10 reps - Hooklying Single Knee to Chest Stretch  - 1 x daily - 7 x weekly - 1 sets - 2 reps - 20 sec hold - Supine Posterior Pelvic Tilt  - 1 x daily - 7 x weekly - 2 sets - 10 reps - Supine Lower Trunk Rotation  - 1 x daily - 7 x weekly - 2 sets - 5 reps - 10 sec hold - Clamshell  - 1 x daily - 7 x weekly - 2 sets - 10 reps - Seated Slump Nerve Glide  - 1 x daily - 7 x weekly - 1 sets - 10 reps - Clam with Resistance  - 1 x daily - 7 x weekly - 1 sets - 10 reps - Beginner Reverse Clamshell  - 1 x daily - 7 x weekly - 1 sets - 10 reps - Sidelying Hip Abduction  - 1 x daily - 7 x weekly - 1 sets - 10 reps - Standing Isometric Hip Abduction with Ball on Wall  - 1 x daily - 7 x weekly - 1 sets - 10 reps - Side Stepping with Resistance at Thighs  - 1 x daily - 7 x weekly - 1 sets - 10 reps - Hip Flexor Stretch at Edge of Bed  - 1 x daily - 7 x weekly - 1 sets - 3 reps - 30 hold - Hip Flexor Stretch with Chair  - 1 x daily - 7 x weekly - 1 sets - 3 reps - 20 hold - ASSESSMENT:  CLINICAL IMPRESSION: The patient is highly compliant with HEP which compliments manual therapy and DN.   Good response to DN combined with ES with improved soft tissue mobility and decreased tender points noted.  Therapist monitoring response to all interventions and modifying treatment accordingly. Will reassess progress toward goals next visit to determine readiness for discharge from PT.         OBJECTIVE IMPAIRMENTS: decreased activity tolerance, decreased ROM, decreased strength, increased fascial restrictions, impaired perceived functional ability, and pain.   ACTIVITY LIMITATIONS: sitting and standing  PARTICIPATION LIMITATIONS: driving, community activity, and occupation  PERSONAL FACTORS: 1 comorbidity: recent breast surgery for CA  are also affecting patient's functional outcome.   REHAB POTENTIAL: Good  CLINICAL DECISION MAKING: Stable/uncomplicated  EVALUATION COMPLEXITY: Low   GOALS: Goals reviewed with patient? Yes  SHORT TERM GOALS: Target date: 12/29/2021  The patient will demonstrate knowledge of basic self care strategies and exercises to promote healing   Baseline: Goal status: MET  2.  The patient will report a 40% improvement in pain levels with functional activities including sitting, driving, night time Baseline:  Goal status: MET  3.  Pain centralized 50% of the time Baseline:  Goal status: MET      LONG TERM GOALS: Target date: 04/24/2022    The patient will be independent in a safe self progression of a home exercise program to promote further recovery of function   Baseline:  Goal status: ongoing  2.  The patient will report a 75% improvement in pain levels with functional activities which are currently difficult including sitting, riding in the car at night and with her period Baseline:  Goal status: ongoing 3.  The patient will have improved left hip strength to at least 4+/5 needed for standing, walking longer distances   Baseline:  Goal status: met 2/20   4.  The patient will have improved FOTO score to   86%    indicating improved  function with less pain  Baseline:  Goal status: ongoing   PLAN:  PT FREQUENCY: every 2 weeks  PT DURATION: 12 weeks  PLANNED INTERVENTIONS: Therapeutic exercises, Therapeutic activity, Neuromuscular re-education, Gait training, Patient/Family education, Self Care, Joint mobilization, Aquatic Therapy, Dry Needling, Spinal manipulation, Spinal mobilization, Cryotherapy, Moist heat, Taping, Ionotophoresis '4mg'$ /ml Dexamethasone, Manual therapy, and Re-evaluation.  PLAN FOR NEXT SESSION:   FOTO; check progress toward goals to determine readiness for discharge;  DN with added ES bil hips and bil lumbar multifidi;   bil glute strengthening  Ruben Im, PT 04/04/22 8:15 PM Phone: [phone] Fax: [place] [address] [address] [place], [place] [address] Phone # [phone] Fax [phone]

## 2022-04-06 ENCOUNTER — Telehealth: Payer: Self-pay | Admitting: *Deleted

## 2022-04-06 ENCOUNTER — Encounter: Payer: Self-pay | Admitting: Hematology and Oncology

## 2022-04-06 LAB — SIGNATERA
SIGNATERA MTM READOUT: 0 MTM/ml
SIGNATERA TEST RESULT: NEGATIVE

## 2022-04-06 NOTE — Telephone Encounter (Signed)
Per MD request, RN placed call to pt regarding negative (Not Detected) recent [employer] testing.  No answer.  LVM for pt to return call.

## 2022-04-09 ENCOUNTER — Encounter: Payer: Self-pay | Admitting: Hematology and Oncology

## 2022-04-10 ENCOUNTER — Other Ambulatory Visit: Payer: Self-pay | Admitting: Radiology

## 2022-04-10 DIAGNOSIS — R519 Headache, unspecified: Secondary | ICD-10-CM

## 2022-04-10 DIAGNOSIS — F419 Anxiety disorder, unspecified: Secondary | ICD-10-CM

## 2022-04-10 DIAGNOSIS — R5383 Other fatigue: Secondary | ICD-10-CM

## 2022-04-10 MED ORDER — ESCITALOPRAM OXALATE 5 MG PO TABS: 5.0000 mg | ORAL_TABLET | Freq: Every day | ORAL | 2 refills | Status: DC

## 2022-04-10 NOTE — Telephone Encounter (Signed)
Spoke with patient. Recommended OV/MyChart visit for further discussion, patient agreeable to [place] visit. Scheduled for 3/20 at 3pm with Wende Crease, NP. Reviewed MyChart check-in and access to appt.   Patient requesting iron level to be drawn. Offered to change to OV, patient declined at this time.Advised can further discuss during MyChart visit and if appropriate can return for lab appt at later date. Patient agreeable and appreciative of call.   Routing to provider for final review. Patient is agreeable to disposition. Will close encounter.

## 2022-04-11 ENCOUNTER — Telehealth: Payer: 59 | Admitting: Radiology

## 2022-04-11 ENCOUNTER — Other Ambulatory Visit: Payer: 59

## 2022-04-11 DIAGNOSIS — R5383 Other fatigue: Secondary | ICD-10-CM

## 2022-04-11 DIAGNOSIS — R519 Headache, unspecified: Secondary | ICD-10-CM

## 2022-04-12 ENCOUNTER — Encounter: Payer: Self-pay | Admitting: Hematology and Oncology

## 2022-04-12 LAB — THYROID PANEL WITH TSH
Free Thyroxine Index: 2.5 (ref 1.4–3.8)
T3 Uptake: 29 % (ref 22–35)
T4, Total: 8.5 ug/dL (ref 5.1–11.9)
TSH: 2.07 mIU/L

## 2022-04-12 LAB — CBC
HCT: 35.9 % (ref 35.0–45.0)
Hemoglobin: 11.9 g/dL (ref 11.7–15.5)
MCH: 31.2 pg (ref 27.0–33.0)
MCHC: 33.1 g/dL (ref 32.0–36.0)
MCV: 94.2 fL (ref 80.0–100.0)
MPV: 10 fL (ref 7.5–12.5)
Platelets: 264 10*3/uL (ref 140–400)
RBC: 3.81 10*6/uL (ref 3.80–5.10)
RDW: 12.1 % (ref 11.0–15.0)
WBC: 5.6 10*3/uL (ref 3.8–10.8)

## 2022-04-12 LAB — B12 AND FOLATE PANEL
Folate: >24 ng/mL
Vitamin B-12: 1093 pg/mL (ref 200–1100)

## 2022-04-12 LAB — FERRITIN: Ferritin: 18 ng/mL (ref 16–232)

## 2022-04-19 ENCOUNTER — Ambulatory Visit: Payer: 59 | Admitting: Physical Therapy

## 2022-04-19 DIAGNOSIS — M5431 Sciatica, right side: Secondary | ICD-10-CM | POA: Diagnosis not present

## 2022-04-19 DIAGNOSIS — M6281 Muscle weakness (generalized): Secondary | ICD-10-CM

## 2022-04-19 NOTE — Therapy (Signed)
OUTPATIENT PHYSICAL THERAPY TREATMENT NOTE/DISCHARGE SUMMARY   Patient Name: Kaitlin [name] MRN: [id number] DOB:02-08-1975, 47 y.o., female Today's Date: 04/19/2022   PT End of Session - 04/19/22 1151     Visit Number 12    Number of Visits 15    Date for PT Re-Evaluation 04/24/22    Authorization Type 20 visit limit 5 used-  may get more visits start of year    PT Start Time 1151    PT Stop Time 1230    PT Time Calculation (min) 39 min    Activity Tolerance Patient tolerated treatment well                 Past Medical History:  Diagnosis Date   Allergy    Anemia    Asthma    Cardiomyopathy ([place])    Chronic headaches    Delivery normal 11/2008   Family history of breast cancer    Family history of colon cancer    Family history of leukemia    Family history of melanoma    Family history of ovarian cancer    Heart murmur    Kidney stone    x of   PONV (postoperative nausea and vomiting)    S/P tonsillectomy    2000   Past Surgical History:  Procedure Laterality Date   BREAST RECONSTRUCTION WITH PLACEMENT OF TISSUE EXPANDER AND ALLODERM Bilateral 12/06/2020   Procedure: BREAST RECONSTRUCTION WITH PLACEMENT OF TISSUE EXPANDER AND ALLODERM;  Surgeon: Irene Limbo, MD;  Location: [facility];  Service: Plastics;  Laterality: Bilateral;   LEEP     MASTECTOMY W/ SENTINEL NODE BIOPSY Right 12/06/2020   Procedure: RIGHT MASTECTOMY WITH RIGHT AXILLARY SENTINEL LYMPH NODE BIOPSY;  Surgeon: Rolm Bookbinder, MD;  Location: [facility];  Service: General;  Laterality: Right;   REMOVAL OF BILATERAL TISSUE EXPANDERS WITH PLACEMENT OF BILATERAL BREAST IMPLANTS Bilateral 09/15/2021   Procedure: REMOVAL OF BILATERAL TISSUE EXPANDERS WITH PLACEMENT OF BILATERAL BREAST IMPLANTS;  Surgeon: Irene Limbo, MD;  Location: [facility];  Service: Plastics;  Laterality: Bilateral;   tonsil removal  01/22/1998   TOTAL MASTECTOMY Left  12/06/2020   Procedure: LEFT TOTAL MASTECTOMY;  Surgeon: Rolm Bookbinder, MD;  Location: [facility];  Service: General;  Laterality: Left;   Patient Active Problem List   Diagnosis Date Noted   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   Malignant neoplasm of upper-outer quadrant of right breast in female, estrogen receptor positive ([place]) 12/12/2020   S/P bilateral mastectomy 12/06/2020   Genetic testing 11/17/2020   Family history of breast cancer 11/02/2020   Family history of ovarian cancer 11/02/2020   Family history of colon cancer 11/02/2020   Family history of melanoma 11/02/2020   Family history of leukemia 11/02/2020   Bilateral breast cancer ([place]) 11/01/2020   LLQ abdominal pain 04/06/2011    PCP: [name] MD  REFERRING PROVIDER: [name] MD  REFERRING DIAG: M54.30 sciatica;  D05.12 ductal carninoma in situ of left breast  Rationale for Evaluation and Treatment: Rehabilitation  THERAPY DIAG: sciatica  ONSET DATE: 10/31/21  SUBJECTIVE:  SUBJECTIVE STATEMENT: I'm doing well the musculoskeletal pain.  I can tell that what I feel is from working out Monday and Tuesday.    Started on a low dose anxiety medication.   Rates overall progress since start of care at 95%.  PERTINENT HISTORY:  Bil mastectomy with immediate reconstruction 12/06/20 with 1/3 LN positive.  Rt Grade 2 IDC with DCIS ER positive.  Lt breast DCIS intermediate, no LN. Radiation 01/27/21-03/14/2021 to Right chest.  Implants 09/15/21   PAIN:  Are you having pain? Mild discomfort from working out  NPRS scale: 0/10 Pain location:  right buttock;  neck/upper traps Aggravating factors: period; sitting in car;  worse at night; standing Relieving factors: Tiger Balm   PRECAUTIONS:  Other: no heat near left breast  WEIGHT BEARING RESTRICTIONS: No  FALLS:  Has patient fallen in last 6 months? 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)  PLOF: Independent  PATIENT GOALS: alleviate pain (I know that Tamoxifen could be causing joint pain)     OBJECTIVE:   DIAGNOSTIC FINDINGS:  CT chest in May and August  PATIENT SURVEYS:  Eval:  FOTO 67%  1/9: 74%  3/28: 83%   COGNITION: Overall cognitive status: Within functional limits for tasks assessed      POSTURE: No Significant postural limitations  PALPATION: Numerous tender points in gluteals, restriction in QL  LUMBAR ROM:   Repeated movement testing indicates a possible extension preference however unable to lie prone  AROM eval 1/9 2/6 3/28  Flexion Fingertips to toes To toes 82 degrees full full  Extension 15 30 full full  Right lateral flexion 35 45 full full  Left lateral flexion 35 45 full full  Right rotation      Left rotation       (Blank rows = not tested)   LOWER EXTREMITY ROM:   WFLs  LOWER EXTREMITY MMT:  Decreased right hip abduction strength 4/5 1/9: left hip abduction 4/5, right 4+/5 2/20:  bil hip abduction 4+/5 to 5-/5;  trunk extensors 4+/5; trunk flexors 4/5 3/28:  abdominals 5-/5;  trunk extensors 5-/5;  bil hip abduction 4+/5  LUMBAR SPECIAL TESTS:  Slump test: Negative  patient states she thinks this would have been painful to do yesterday 1/9: negative GAIT: Comments: normal  TODAY'S TREATMENT:      DATE: 04/19/22: Discussion of current ex program Neuroscience of pain education FOTO Lumbar ROM Trunk and LE strength and stability testing Review of progress toward goals     DATE: 04/04/22: Discussion of current ex program Trigger Point Dry-Needling  Treatment instructions: Expect mild to moderate muscle soreness. S/S of pneumothorax if dry needled over a lung field, and to seek immediate medical attention should they occur.  Patient verbalized understanding of these instructions and education. Patient Consent Given: Yes Education handout provided: Yes Muscles treated:  bil lumbar multifidi; glute/piriformis in prone bil Electrical stimulation performed: yes Parameters: 1.5 ma bil multifidi and bil gluteals 79min Treatment response/outcome: Utilized skilled palpation to identify trigger points.  Able to illicit twitch response and muscle elongation following. Manual Therapy:  Soft tissue mobilization to glute/piriformis region               PATIENT EDUCATION:  Education details: plan of care;  DN after care handout Person educated: Patient Education method: Explanation Education comprehension: verbalized understanding  HOME EXERCISE PROGRAM: Access Code: [email or web address] URL: [email or web address] Date: 12/26/2021 Prepared by: Ruben Im  Exercises - Standing Lumbar Extension  -  1 x daily - 7 x weekly - 2 sets - 10 reps - Seated Piriformis Stretch  - 1 x daily - 7 x weekly - 2 sets - 2 reps - 20 sec hold - Seated Hamstring Stretch  - 1 x daily - 7 x weekly - 1 sets - 2 reps - 20 sec hold - Seated Sciatic Tensioner  - 1 x daily - 7 x weekly - 2 sets - 10 reps - Hooklying Single Knee to Chest Stretch  - 1 x daily - 7 x weekly - 1 sets - 2 reps - 20 sec hold - Supine Posterior Pelvic Tilt  - 1 x daily - 7 x weekly - 2 sets - 10 reps - Supine Lower Trunk Rotation  - 1 x daily - 7 x weekly - 2 sets - 5 reps - 10 sec hold - Clamshell  - 1 x daily - 7 x weekly - 2 sets - 10 reps - Seated Slump Nerve Glide  - 1 x daily - 7 x weekly - 1 sets - 10 reps - Clam with Resistance  - 1 x daily - 7 x weekly - 1 sets - 10 reps - Beginner Reverse Clamshell  - 1 x daily - 7 x weekly - 1 sets - 10 reps - Sidelying Hip Abduction  - 1 x daily - 7 x weekly - 1 sets - 10 reps - Standing Isometric Hip Abduction with Ball on Wall  - 1 x daily - 7 x weekly - 1 sets - 10 reps - Side Stepping with Resistance at  Thighs  - 1 x daily - 7 x weekly - 1 sets - 10 reps - Hip Flexor Stretch at Edge of Bed  - 1 x daily - 7 x weekly - 1 sets - 3 reps - 30 hold - Hip Flexor Stretch with Chair  - 1 x daily - 7 x weekly - 1 sets - 3 reps - 20 hold - ASSESSMENT:  CLINICAL IMPRESSION: The patient has met the majority of rehab goals, with noted improvements in pain reduction, outcome score, ROM, strength and functional mobility.  A comprehensive HEP has been established and anticipate further improvements over time with regular performance of the program.  Recommend discharge from PT at this time.      OBJECTIVE IMPAIRMENTS: decreased activity tolerance, decreased ROM, decreased strength, increased fascial restrictions, impaired perceived functional ability, and pain.   ACTIVITY LIMITATIONS: sitting and standing  PARTICIPATION LIMITATIONS: driving, community activity, and occupation  PERSONAL FACTORS: 1 comorbidity: recent breast surgery for CA  are also affecting patient's functional outcome.   REHAB POTENTIAL: Good  CLINICAL DECISION MAKING: Stable/uncomplicated  EVALUATION COMPLEXITY: Low   GOALS: Goals reviewed with patient? Yes  SHORT TERM GOALS: Target date: 12/29/2021  The patient will demonstrate knowledge of basic self care strategies and exercises to promote healing   Baseline: Goal status: MET  2.  The patient will report a 40% improvement in pain levels with functional activities including sitting, driving, night time Baseline:  Goal status: MET  3.  Pain centralized 50% of the time Baseline:  Goal status: MET      LONG TERM GOALS: Target date: 04/24/2022    The patient will be independent in a safe self progression of a home exercise program to promote further recovery of function   Baseline:  Goal status: met 3/28  2.  The patient will report a 75% improvement in pain levels with functional activities which are  currently difficult including sitting, riding in the car at night  and with her period Baseline:  Goal status: met 95%  3.  The patient will have improved left hip strength to at least 4+/5 needed for standing, walking longer distances   Baseline:  Goal status: met 2/20   4.  The patient will have improved FOTO score to   86%    indicating improved function with less pain  Baseline:  Goal status: partially met    PLAN:PHYSICAL THERAPY DISCHARGE SUMMARY  Visits from Start of Care: 12  Current functional level related to goals / functional outcomes: See clinical impressions above   Remaining deficits: As above   Education / Equipment: HEP   Patient agrees to discharge. Patient goals were met. Patient is being discharged due to meeting the stated rehab goals.    Ruben Im, PT 04/19/22 12:47 PM Phone: [phone] Fax: [phone]

## 2022-04-30 ENCOUNTER — Ambulatory Visit: Payer: 59 | Attending: Adult Health

## 2022-04-30 VITALS — Wt 136.1 lb

## 2022-04-30 DIAGNOSIS — Z483 Aftercare following surgery for neoplasm: Secondary | ICD-10-CM | POA: Insufficient documentation

## 2022-04-30 NOTE — Therapy (Signed)
OUTPATIENT PHYSICAL THERAPY SOZO SCREENING NOTE   Patient Name: Kaitlin [name] MRN: [id number] DOB:09/01/1975, 47 y.o., female Today's Date: 04/30/2022  PCP: Merri Brunette, MD REFERRING PROVIDER: Lillard Anes Cornett*   PT End of Session - 04/30/22 0936     Visit Number 12   # ucnhanged due to screen only   PT Start Time 0934    PT Stop Time 0938    PT Time Calculation (min) 4 min    Activity Tolerance Patient tolerated treatment well    Behavior During Therapy [facility] for tasks assessed/performed             Past Medical History:  Diagnosis Date   Allergy    Anemia    Asthma    Cardiomyopathy (HCC)    Chronic headaches    Delivery normal 11/2008   Family history of breast cancer    Family history of colon cancer    Family history of leukemia    Family history of melanoma    Family history of ovarian cancer    Heart murmur    Kidney stone    x of   PONV (postoperative nausea and vomiting)    S/P tonsillectomy    2000   Past Surgical History:  Procedure Laterality Date   BREAST RECONSTRUCTION WITH PLACEMENT OF TISSUE EXPANDER AND ALLODERM Bilateral 12/06/2020   Procedure: BREAST RECONSTRUCTION WITH PLACEMENT OF TISSUE EXPANDER AND ALLODERM;  Surgeon: Glenna Fellows, MD;  Location: [facility];  Service: Plastics;  Laterality: Bilateral;   LEEP     MASTECTOMY W/ SENTINEL NODE BIOPSY Right 12/06/2020   Procedure: RIGHT MASTECTOMY WITH RIGHT AXILLARY SENTINEL LYMPH NODE BIOPSY;  Surgeon: [name], MD;  Location: [facility];  Service: General;  Laterality: Right;   REMOVAL OF BILATERAL TISSUE EXPANDERS WITH PLACEMENT OF BILATERAL BREAST IMPLANTS Bilateral 09/15/2021   Procedure: REMOVAL OF BILATERAL TISSUE EXPANDERS WITH PLACEMENT OF BILATERAL BREAST IMPLANTS;  Surgeon: Glenna Fellows, MD;  Location: [facility];  Service: Plastics;  Laterality: Bilateral;   tonsil removal  01/22/1998   TOTAL MASTECTOMY Left  12/06/2020   Procedure: LEFT TOTAL MASTECTOMY;  Surgeon: [name], MD;  Location: [facility];  Service: General;  Laterality: Left;   Patient Active Problem List   Diagnosis Date Noted   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   Malignant neoplasm of upper-outer quadrant of right breast in female, estrogen receptor positive 12/12/2020   S/P bilateral mastectomy 12/06/2020   Genetic testing 11/17/2020   Family history of breast cancer 11/02/2020   Family history of ovarian cancer 11/02/2020   Family history of colon cancer 11/02/2020   Family history of melanoma 11/02/2020   Family history of leukemia 11/02/2020   Bilateral breast cancer 11/01/2020   LLQ abdominal pain 04/06/2011    REFERRING DIAG: right breast cancer at risk for lymphedema  THERAPY DIAG:  Aftercare following surgery for neoplasm  PERTINENT HISTORY: Bil mastectomy with immediate reconstruction 12/06/20 with 1/3 LN positive.  Rt Grade 2 IDC with DCIS ER positive.  Lt breast DCIS intermediate, no LN. Radiation 01/27/21-03/14/2021 to Right chest.  Implants 09/15/21   PRECAUTIONS: right UE Lymphedema risk, None  SUBJECTIVE: Pt returns for her 3 month L-Dex screen.   PAIN:  Are you having pain? No  SOZO SCREENING: Patient was assessed today using the SOZO machine to determine the lymphedema index score. This was compared to her baseline score. It was determined that she is  within the recommended range when compared to her baseline and no further action is needed at this time. She will continue SOZO screenings. These are done every 3 months for 2 years post operatively followed by every 6 months for 2 years, and then annually.   L-DEX FLOWSHEETS - 04/30/22 0900       L-DEX LYMPHEDEMA SCREENING   Measurement Type Unilateral    L-DEX MEASUREMENT EXTREMITY Upper Extremity    POSITION  Standing    DOMINANT SIDE Right    At Risk Side Right    BASELINE SCORE (UNILATERAL) -0.2     L-DEX SCORE (UNILATERAL) 5.6    VALUE CHANGE (UNILAT) 5.8                Hermenia BersRosenberger, Kaitlin [name], PTA 04/30/2022, 9:39 AM

## 2022-05-02 ENCOUNTER — Encounter: Payer: Self-pay | Admitting: *Deleted

## 2022-05-02 NOTE — Progress Notes (Signed)
Received mychart message from pt with complaint of increased anxiety during her menstrual cycles while on Tamoxifen.  Pt states she has tried Lexapro with OBGYN with no relief.  Pt requesting office visit to discuss natural remedies such as Ashwaganda, B-complex vitamin and L-Theanine.  Appt scheduled with NP.  Pt educated and verbalized understanding.

## 2022-05-04 ENCOUNTER — Inpatient Hospital Stay: Payer: 59 | Attending: Adult Health | Admitting: Adult Health

## 2022-05-04 ENCOUNTER — Encounter: Payer: Self-pay | Admitting: Adult Health

## 2022-05-04 DIAGNOSIS — C50911 Malignant neoplasm of unspecified site of right female breast: Secondary | ICD-10-CM | POA: Diagnosis not present

## 2022-05-04 DIAGNOSIS — Z170 Estrogen receptor positive status [ER+]: Secondary | ICD-10-CM

## 2022-05-04 DIAGNOSIS — C50912 Malignant neoplasm of unspecified site of left female breast: Secondary | ICD-10-CM

## 2022-05-04 NOTE — Progress Notes (Signed)
[facility] Cancer Follow up:    Kaitlin Brunette, MD [phone] Kaitlin [name] [address] [place] [address]   DIAGNOSIS:  Cancer Staging  Bilateral breast cancer Staging form: Breast, AJCC 8th Edition - Clinical: Stage IIA (cT3, cN0, cM0, G2, ER+, PR+, HER2-) - Signed by Serena Croissant, MD on 11/01/2020 Stage prefix: Initial diagnosis Histologic grading system: 3 grade system  Ductal carcinoma in situ (DCIS) of left breast Staging form: Breast, AJCC 8th Edition - Clinical: Stage 0 (cTis (DCIS), cN0, cM0, ER+, PR+) - Signed by Loa Socks, NP on 06/21/2021  Malignant neoplasm of upper-outer quadrant of right breast in female, estrogen receptor positive Staging form: Breast, AJCC 8th Edition - Pathologic: Stage IB (pT2, pN1a(sn), cM0, G2, ER+, PR+, HER2-) - Unsigned Stage prefix: Initial diagnosis Method of lymph node assessment: Sentinel lymph node biopsy Multigene prognostic tests performed: MammaPrint Histologic grading system: 3 grade system  I connected with Kaitlin [name] on 05/04/22 at  8:30 AM EDT by telephone and verified that I am speaking with the correct person using two identifiers.  I discussed the limitations, risks, security and privacy concerns of performing an evaluation and management service by telephone and the availability of in person appointments.  I also discussed with the patient that there may be a patient responsible charge related to this service. The patient expressed understanding and agreed to proceed.  Patient location: home Provider location: [facility] office  SUMMARY OF ONCOLOGIC HISTORY: Oncology History  Bilateral breast cancer  11/01/2020 Initial Diagnosis   Right breast biopsy UOQ 9:30 position: Grade 2 IDC with DCIS ER 100%, PR [place], Ki-67 2%, HER2 negative Right breast biopsy LIQ: Grade 1 IDC with DCIS Left breast biopsy UOQ: DCIS intermediate grade    11/01/2020 Cancer Staging   Staging form: Breast, AJCC 8th  Edition - Clinical: Stage IIA (cT3, cN0, cM0, G2, ER+, PR+, HER2-) - Signed by Serena Croissant, MD on 11/01/2020 Stage prefix: Initial diagnosis Histologic grading system: 3 grade system   11/16/2020 Genetic Testing   Negative genetic testing on the CancerNext-Expanded+RNAinsight.  The report date is 11/16/2020  The CancerNext-Expanded gene panel offered by [facility] and includes sequencing and rearrangement analysis for the following 77 genes: AIP, ALK, APC*, ATM*, AXIN2, BAP1, BARD1, BLM, BMPR1A, BRCA1*, BRCA2*, BRIP1*, CDC73, CDH1*, CDK4, CDKN1B, CDKN2A, CHEK2*, CTNNA1, DICER1, FANCC, FH, FLCN, GALNT12, KIF1B, LZTR1, MAX, MEN1, MET, MLH1*, MSH2*, MSH3, MSH6*, MUTYH*, NBN, NF1*, NF2, NTHL1, PALB2*, PHOX2B, PMS2*, POT1, PRKAR1A, PTCH1, PTEN*, RAD51C*, RAD51D*, RB1, RECQL, RET, SDHA, SDHAF2, SDHB, SDHC, SDHD, SMAD4, SMARCA4, SMARCB1, SMARCE1, STK11, SUFU, TMEM127, TP53*, TSC1, TSC2, VHL and XRCC2 (sequencing and deletion/duplication); EGFR, EGLN1, HOXB13, KIT, MITF, PDGFRA, POLD1, and POLE (sequencing only); EPCAM and GREM1 (deletion/duplication only). DNA and RNA analyses performed for * genes.    12/06/2020 Surgery   Bilateral Mastectomies:  Left Mastectomy:HG DCIS 2.2 cm with necrosis, Margins Neg ER 90%, PR 40% Right Mastectomy: Grade 2 IDC with DCIS 3.8 cm, 1/3 LN ER 100%, PR 100%, Her 2 Neg, Ki 67: 2-15%   12/13/2020 Miscellaneous   MammaPrint: Luminal type A, low risk   01/27/2021 - 03/14/2021 Radiation Therapy   Site Technique Total Dose (Gy) Dose per Fx (Gy) Completed Fx Beam Energies  Chest Wall, Right: CW_R 3D 50.4/50.4 1.8 28/28 6XFFF  Chest Wall, Right: CW_R_SCLV 3D 50.4/50.4 1.8 28/28 6X, 10X  Chest Wall, Right: CW_R_Bst Electron 10/10 2 5/5 6E     03/22/2021 -  Anti-estrogen oral therapy   Adjuvant tamoxifen  Ductal carcinoma in situ (DCIS) of left breast  01/03/2021 Initial Diagnosis   Ductal carcinoma in situ (DCIS) of left breast   06/21/2021 Cancer Staging   Staging  form: Breast, AJCC 8th Edition - Clinical: Stage 0 (cTis (DCIS), cN0, cM0, ER+, PR+) - Signed by Loa Socks, NP on 06/21/2021     CURRENT THERAPY: Tamoxifen  INTERVAL HISTORY: Kaitlin [name] 47 y.o. female returns for discussion of her anxiety that occurs during her cycle.  She notes that she has adjusted to the side effects of tamoxifen in which took longer than anticipated but she is happy that she is adjusted.  She now notices that she has a menstrual cycle that is normal and heaviness but occurs about once every 34 days.  Since November her cycles have been accompanied by anxiety, nausea, and cramping.  She spoke with her GYN provider and was diagnosed with PMDD.  She was prescribed Lexapro which her body did not tolerate at all.  BuSpar was recommended however she wants to know if she could take L-theanine or Ashwaganda instead.   Patient Active Problem List   Diagnosis Date Noted   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   Malignant neoplasm of upper-outer quadrant of right breast in female, estrogen receptor positive 12/12/2020   S/P bilateral mastectomy 12/06/2020   Genetic testing 11/17/2020   Family history of breast cancer 11/02/2020   Family history of ovarian cancer 11/02/2020   Family history of colon cancer 11/02/2020   Family history of melanoma 11/02/2020   Family history of leukemia 11/02/2020   Bilateral breast cancer 11/01/2020   LLQ abdominal pain 04/06/2011    is allergic to ceclor [cefaclor], rizatriptan, amoxicillin, and penicillins.  MEDICAL HISTORY: Past Medical History:  Diagnosis Date   Allergy    Anemia    Asthma    Cardiomyopathy    Chronic headaches    Delivery normal 11/2008   Family history of breast cancer    Family history of colon cancer    Family history of leukemia    Family history of melanoma    Family history of ovarian cancer    Heart murmur    Kidney stone    x of   PONV (postoperative nausea and  vomiting)    S/P tonsillectomy    2000    SURGICAL HISTORY: Past Surgical History:  Procedure Laterality Date   BREAST RECONSTRUCTION WITH PLACEMENT OF TISSUE EXPANDER AND ALLODERM Bilateral 12/06/2020   Procedure: BREAST RECONSTRUCTION WITH PLACEMENT OF TISSUE EXPANDER AND ALLODERM;  Surgeon: Glenna Fellows, MD;  Location: [facility];  Service: Plastics;  Laterality: Bilateral;   LEEP     MASTECTOMY W/ SENTINEL NODE BIOPSY Right 12/06/2020   Procedure: RIGHT MASTECTOMY WITH RIGHT AXILLARY SENTINEL LYMPH NODE BIOPSY;  Surgeon: [name], MD;  Location: [facility];  Service: General;  Laterality: Right;   REMOVAL OF BILATERAL TISSUE EXPANDERS WITH PLACEMENT OF BILATERAL BREAST IMPLANTS Bilateral 09/15/2021   Procedure: REMOVAL OF BILATERAL TISSUE EXPANDERS WITH PLACEMENT OF BILATERAL BREAST IMPLANTS;  Surgeon: Glenna Fellows, MD;  Location: [facility];  Service: Plastics;  Laterality: Bilateral;   tonsil removal  01/22/1998   TOTAL MASTECTOMY Left 12/06/2020   Procedure: LEFT TOTAL MASTECTOMY;  Surgeon: [name], MD;  Location:  SURGERY CENTER;  Service: General;  Laterality: Left;    SOCIAL HISTORY: Social History   Socioeconomic History   Marital status: Married    Spouse name:  Not on file   Number of children: 1   Years of education: Not on file   Highest education level: Not on file  Occupational History   Occupation: Housewife  Tobacco Use   Smoking status: Former    Types: Cigarettes   Smokeless tobacco: Never  Vaping Use   Vaping Use: Never used  Substance and Sexual Activity   Alcohol use: Not Currently   Drug use: No   Sexual activity: Not Currently    Partners: Male    Birth control/protection: Surgical    Comment: Husband had Vasectomy  Other Topics Concern   Not on file  Social History Narrative   Not on file   Social Determinants of Health   Financial Resource Strain: Not on  file  Food Insecurity: Not on file  Transportation Needs: Not on file  Physical Activity: Not on file  Stress: Not on file  Social Connections: Not on file  Intimate Partner Violence: Not on file    FAMILY HISTORY: Family History  Problem Relation Age of Onset   Memory loss Mother    Ulcerative colitis Mother    Colon polyps Father    Bladder Cancer Father    Irritable bowel syndrome Sister    Leukemia Maternal Aunt 50       AML   Melanoma Maternal Aunt 16   Breast cancer Paternal Aunt 25   Colon cancer Paternal Aunt    Leukemia Paternal Aunt 67   Prostate cancer Paternal Uncle 98   Colon polyps Paternal Uncle    Cervical cancer Maternal Grandmother    Parkinson's disease Maternal Grandmother    Alzheimer's disease Maternal Grandmother    Multiple myeloma Maternal Grandfather    Skin cancer Maternal Grandfather    Skin cancer Paternal Grandmother    Colon cancer Paternal Grandfather 10   Stroke Paternal Grandfather    Ovarian cancer Other 52       MGMs sister   Esophageal cancer Neg Hx    Rectal cancer Neg Hx    Stomach cancer Neg Hx     Review of Systems  Constitutional:  Negative for appetite change, chills, fatigue, fever and unexpected weight change.  HENT:   Negative for hearing loss, lump/mass and trouble swallowing.   Eyes:  Negative for eye problems and icterus.  Respiratory:  Negative for chest tightness, cough and shortness of breath.   Cardiovascular:  Negative for chest pain, leg swelling and palpitations.  Gastrointestinal:  Negative for abdominal distention, abdominal pain, constipation, diarrhea, nausea and vomiting.  Endocrine: Negative for hot flashes.  Genitourinary:  Negative for difficulty urinating.   Musculoskeletal:  Negative for arthralgias.  Skin:  Negative for itching and rash.  Neurological:  Negative for dizziness, extremity weakness, headaches and numbness.  Hematological:  Negative for adenopathy. Does not bruise/bleed easily.   Psychiatric/Behavioral:  Negative for depression. The patient is not nervous/anxious.       PHYSICAL EXAMINATION Patient appears well she is in no apparent distress mood and behavior are normal speech is normal  LABORATORY DATA: None for this visit    ASSESSMENT and THERAPY PLAN:   Bilateral breast cancer (HCC) 12/06/20: Bilateral Mastectomies:  Left Mastectomy:HG DCIS 2.2 cm with necrosis, Margins Neg ER 90%, PR 40% Right Mastectomy: Grade 2 IDC with DCIS 3.8 cm, 1/3 LN ER 100%, PR 100%, Her 2 Neg, Ki 67: 2-15%   Pathology counseling: I discussed the final pathology report of the patient provided  a copy of this report. I  discussed the margins as well as lymph node surgeries. We also discussed the final staging along with previously performed ER/PR and HER-2/neu testing.   Treatment Plan: 1. Mammaprint: Luminal type a low risk 2. Adjuvant XRT completed 03/14/2021 3. Adjuvant Anti-estrogen therapy with tamoxifen to start 03/22/2021   Tamoxifen counseling:  PMDD-we reviewed in detail L-theanine and Ashwaganda.  I discussed the metabolism and reviewed potential interactions which do not include tamoxifen.  Suggested she start at a lower dose when taking intake in the evening because it can cause some sleepiness.  We reviewed if these remedies do not work that she could take BuSpar as suggested by her GYN and I reviewed those pathways of metabolism and verified that there is not a significant interaction with tamoxifen as well.  She is going to try these and is very motivated to stay on top of her mental health during her menstrual cycles.  We also discussed adding an iron supplement such as a slow iron that she can take since that is easier on the stomach.  We will see Kaitlin [name] back in September for follow-up.  She knows to call between now and then if she has any questions or concerns.   Follow up instructions:    -Return to cancer center 09/2022  -Follow up with surgery  03/2023   The patient was provided an opportunity to ask questions and all were answered. The patient agreed with the plan and demonstrated an understanding of the instructions.   The patient was advised to call back or seek an in-person evaluation if the symptoms worsen or if the condition fails to improve as anticipated.   I provided 17 minutes of non face-to-face telephone visit time during this encounter, and > 50% was spent counseling as documented under my assessment & plan.  Lillard Anes, NP 05/04/22 9:15 AM Medical Oncology and Hematology [facility] [address] [place], [place] [address] Tel. [phone]    Fax. [phone]

## 2022-05-04 NOTE — Assessment & Plan Note (Signed)
12/06/20: Bilateral Mastectomies:  Left Mastectomy:HG DCIS 2.2 cm with necrosis, Margins Neg ER 90%, PR 40% Right Mastectomy: Grade 2 IDC with DCIS 3.8 cm, 1/3 LN ER 100%, PR 100%, Her 2 Neg, Ki 67: 2-15%   Pathology counseling: I discussed the final pathology report of the patient provided  a copy of this report. I discussed the margins as well as lymph node surgeries. We also discussed the final staging along with previously performed ER/PR and HER-2/neu testing.   Treatment Plan: 1. Mammaprint: Luminal type a low risk 2. Adjuvant XRT completed 03/14/2021 3. Adjuvant Anti-estrogen therapy with tamoxifen to start 03/22/2021   Tamoxifen counseling:  PMDD-we reviewed in detail L-theanine and Ashwaganda.  I discussed the metabolism and reviewed potential interactions which do not include tamoxifen.  Suggested she start at a lower dose when taking intake in the evening because it can cause some sleepiness.  We reviewed if these remedies do not work that she could take BuSpar as suggested by her GYN and I reviewed those pathways of metabolism and verified that there is not a significant interaction with tamoxifen as well.  She is going to try these and is very motivated to stay on top of her mental health during her menstrual cycles.  We also discussed adding an iron supplement such as a slow iron that she can take since that is easier on the stomach.  We will see Kaitlin [name] back in September for follow-up.  She knows to call between now and then if she has any questions or concerns.

## 2022-07-03 LAB — SIGNATERA
SIGNATERA MTM READOUT: 0 MTM/ml
SIGNATERA TEST RESULT: NEGATIVE

## 2022-07-04 DIAGNOSIS — R1032 Left lower quadrant pain: Secondary | ICD-10-CM

## 2022-07-05 NOTE — Telephone Encounter (Signed)
Of course. Would you like for her to also schedule an OV with you afterwards or will you just send mychart msg with results? Let me know, thanks.

## 2022-07-05 NOTE — Telephone Encounter (Signed)
Can we send her for an external u/s?

## 2022-07-05 NOTE — Telephone Encounter (Signed)
I can just contact her with results. Thank you!

## 2022-07-12 ENCOUNTER — Ambulatory Visit (HOSPITAL_COMMUNITY)
Admission: RE | Admit: 2022-07-12 | Discharge: 2022-07-12 | Disposition: A | Discharge status: Home / Self Care | Payer: 59 | Source: Ambulatory Visit | Attending: Radiology | Admitting: Radiology

## 2022-07-12 DIAGNOSIS — R1032 Left lower quadrant pain: Secondary | ICD-10-CM | POA: Diagnosis not present

## 2022-07-16 ENCOUNTER — Telehealth: Payer: Self-pay

## 2022-07-16 NOTE — Telephone Encounter (Signed)
Patient called for ultrasound results. She got an ultrasound on Thursday. She is a patient of [facility].

## 2022-07-18 ENCOUNTER — Encounter: Payer: Self-pay | Admitting: Hematology and Oncology

## 2022-07-18 NOTE — Telephone Encounter (Signed)
Patient notified of result. See result note from 07-18-22.Encounter closed.

## 2022-07-18 NOTE — Telephone Encounter (Signed)
Patient notified of result. See result note from 08-17-22. Encounter closed.

## 2022-07-20 ENCOUNTER — Other Ambulatory Visit: Payer: Self-pay | Admitting: Obstetrics and Gynecology

## 2022-07-20 DIAGNOSIS — R9389 Abnormal findings on diagnostic imaging of other specified body structures: Secondary | ICD-10-CM

## 2022-07-23 NOTE — Telephone Encounter (Signed)
The discomfort is unlikely to be coming from such a small cyst. I would like her to see Dr Karma Greaser once she gets here for her sonohyst that way if she needs any further surgical management she will have consistency. That would be the last week of August, Dr Edward Jolly is booking out that far anyway.

## 2022-07-24 NOTE — Telephone Encounter (Signed)
Pt scheduled w/ Dr. Karma Greaser for sonohyst on 09/27/2022. Will route to provider and close.

## 2022-08-13 ENCOUNTER — Ambulatory Visit: Payer: 59 | Admitting: Rehabilitation

## 2022-09-03 DIAGNOSIS — R9389 Abnormal findings on diagnostic imaging of other specified body structures: Secondary | ICD-10-CM

## 2022-09-04 NOTE — Telephone Encounter (Signed)
Can we call to see if we can get her in sooner for [facility]?

## 2022-09-04 NOTE — Telephone Encounter (Signed)
We can schedule her for outpatient repeat u/s to compare size of cyst if she thinks it may have grown.

## 2022-09-04 NOTE — Telephone Encounter (Signed)
Spoke w/ pt. Advised her about no in-office [place] availability at this time but can see if one can be performed at [facility] imaging. Order placed and number to imaging center provided to pt.   In the meantime, pt wanted to inquire if you feel as if something has changed as far as the hemorrhagic cyst that was noted on the side that she is still having the sxs on? TIA

## 2022-09-05 ENCOUNTER — Other Ambulatory Visit: Payer: Self-pay

## 2022-09-05 DIAGNOSIS — R9389 Abnormal findings on diagnostic imaging of other specified body structures: Secondary | ICD-10-CM

## 2022-09-05 NOTE — Telephone Encounter (Signed)
Please confirm if ok to send order/referral to [facility] for sonohyst. Thanks.

## 2022-09-05 NOTE — Telephone Encounter (Signed)
You can but I think they will want to take over her care completely, not just do the sonohyst.

## 2022-09-05 NOTE — Telephone Encounter (Signed)
FYI. Can go ahead and close encounter if appropriate.

## 2022-09-10 ENCOUNTER — Ambulatory Visit: Payer: 59 | Attending: Adult Health

## 2022-09-10 ENCOUNTER — Other Ambulatory Visit: Payer: Self-pay | Admitting: Plastic Surgery

## 2022-09-10 VITALS — Wt 136.2 lb

## 2022-09-10 DIAGNOSIS — Z483 Aftercare following surgery for neoplasm: Secondary | ICD-10-CM | POA: Insufficient documentation

## 2022-09-10 DIAGNOSIS — N63 Unspecified lump in unspecified breast: Secondary | ICD-10-CM

## 2022-09-10 NOTE — Therapy (Signed)
OUTPATIENT PHYSICAL THERAPY SOZO SCREENING NOTE   Patient Name: Kaitlin [name] MRN: [id number] DOB:10-02-1975, 47 y.o., female Today's Date: 09/10/2022  PCP: Merri Brunette, MD REFERRING PROVIDER: Lillard Anes Cornett*   PT End of Session - 09/10/22 0907     Visit Number 12   # unchanged due to screen only   PT Start Time 0905    PT Stop Time 0908    PT Time Calculation (min) 3 min    Activity Tolerance Patient tolerated treatment well    Behavior During Therapy [facility] for tasks assessed/performed             Past Medical History:  Diagnosis Date   Allergy    Anemia    Asthma    Cardiomyopathy (HCC)    Chronic headaches    Delivery normal 11/2008   Family history of breast cancer    Family history of colon cancer    Family history of leukemia    Family history of melanoma    Family history of ovarian cancer    Heart murmur    Kidney stone    x of   PONV (postoperative nausea and vomiting)    S/P tonsillectomy    2000   Past Surgical History:  Procedure Laterality Date   BREAST RECONSTRUCTION WITH PLACEMENT OF TISSUE EXPANDER AND ALLODERM Bilateral 12/06/2020   Procedure: BREAST RECONSTRUCTION WITH PLACEMENT OF TISSUE EXPANDER AND ALLODERM;  Surgeon: Glenna Fellows, MD;  Location: [facility];  Service: Plastics;  Laterality: Bilateral;   LEEP     MASTECTOMY W/ SENTINEL NODE BIOPSY Right 12/06/2020   Procedure: RIGHT MASTECTOMY WITH RIGHT AXILLARY SENTINEL LYMPH NODE BIOPSY;  Surgeon: [name], MD;  Location: [facility];  Service: General;  Laterality: Right;   REMOVAL OF BILATERAL TISSUE EXPANDERS WITH PLACEMENT OF BILATERAL BREAST IMPLANTS Bilateral 09/15/2021   Procedure: REMOVAL OF BILATERAL TISSUE EXPANDERS WITH PLACEMENT OF BILATERAL BREAST IMPLANTS;  Surgeon: Glenna Fellows, MD;  Location: [facility];  Service: Plastics;  Laterality: Bilateral;   tonsil removal  01/22/1998   TOTAL MASTECTOMY  Left 12/06/2020   Procedure: LEFT TOTAL MASTECTOMY;  Surgeon: [name], MD;  Location: [facility];  Service: General;  Laterality: Left;   Patient Active Problem List   Diagnosis Date Noted   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   Malignant neoplasm of upper-outer quadrant of right breast in female, estrogen receptor positive (HCC) 12/12/2020   S/P bilateral mastectomy 12/06/2020   Genetic testing 11/17/2020   Family history of breast cancer 11/02/2020   Family history of ovarian cancer 11/02/2020   Family history of colon cancer 11/02/2020   Family history of melanoma 11/02/2020   Family history of leukemia 11/02/2020   Bilateral breast cancer (HCC) 11/01/2020   LLQ abdominal pain 04/06/2011    REFERRING DIAG: right breast cancer at risk for lymphedema  THERAPY DIAG: Aftercare following surgery for neoplasm  PERTINENT HISTORY: Bil mastectomy with immediate reconstruction 12/06/20 with 1/3 LN positive.  Rt Grade 2 IDC with DCIS ER positive.  Lt breast DCIS intermediate, no LN. Radiation 01/27/21-03/14/2021 to Right chest.  Implants 09/15/21   PRECAUTIONS: right UE Lymphedema risk, None  SUBJECTIVE: Pt returns for her 3 month L-Dex screen.   PAIN:  Are you having pain? No  SOZO SCREENING: Patient was assessed today using the SOZO machine to determine the lymphedema index score. This was compared to her baseline score. It was determined that she  is within the recommended range when compared to her baseline and no further action is needed at this time. She will continue SOZO screenings. These are done every 3 months for 2 years post operatively followed by every 6 months for 2 years, and then annually.   L-DEX FLOWSHEETS - 09/10/22 0900       L-DEX LYMPHEDEMA SCREENING   Measurement Type Unilateral    L-DEX MEASUREMENT EXTREMITY Upper Extremity    POSITION  Standing    DOMINANT SIDE Right    At Risk Side Right    BASELINE SCORE  (UNILATERAL) -0.2    L-DEX SCORE (UNILATERAL) 4.3    VALUE CHANGE (UNILAT) 4.5                Hermenia Bers, PTA 09/10/2022, 9:08 AM

## 2022-09-11 ENCOUNTER — Ambulatory Visit
Admission: RE | Admit: 2022-09-11 | Discharge: 2022-09-11 | Disposition: A | Discharge status: Home / Self Care | Payer: 59 | Source: Ambulatory Visit | Attending: Plastic Surgery | Admitting: Plastic Surgery

## 2022-09-11 ENCOUNTER — Other Ambulatory Visit: Payer: Self-pay | Admitting: Plastic Surgery

## 2022-09-11 DIAGNOSIS — N6310 Unspecified lump in the right breast, unspecified quadrant: Secondary | ICD-10-CM

## 2022-09-11 DIAGNOSIS — N63 Unspecified lump in unspecified breast: Secondary | ICD-10-CM

## 2022-09-12 ENCOUNTER — Ambulatory Visit
Admission: RE | Admit: 2022-09-12 | Discharge: 2022-09-12 | Disposition: A | Discharge status: Home / Self Care | Payer: 59 | Source: Ambulatory Visit | Attending: Plastic Surgery | Admitting: Plastic Surgery

## 2022-09-12 DIAGNOSIS — N6310 Unspecified lump in the right breast, unspecified quadrant: Secondary | ICD-10-CM

## 2022-09-12 HISTORY — PX: BREAST BIOPSY: SHX20

## 2022-09-14 ENCOUNTER — Other Ambulatory Visit: Payer: Self-pay | Admitting: *Deleted

## 2022-09-14 ENCOUNTER — Telehealth: Payer: Self-pay | Admitting: Hematology and Oncology

## 2022-09-14 DIAGNOSIS — C50911 Malignant neoplasm of unspecified site of right female breast: Secondary | ICD-10-CM

## 2022-09-14 NOTE — Telephone Encounter (Signed)
Rescheduled appointment per staff message. Patient is aware of the changes made to her upcoming appointment. 

## 2022-09-14 NOTE — Telephone Encounter (Signed)
Called patient to see if she was willing to move appointment for PET to a different location so it could be done sooner, patient didn't answer left my call back information and told her to call if she was willing to move appointment up and go to different location

## 2022-09-16 ENCOUNTER — Other Ambulatory Visit: Payer: Self-pay | Admitting: Hematology and Oncology

## 2022-09-18 ENCOUNTER — Encounter: Payer: Self-pay | Admitting: General Surgery

## 2022-09-18 ENCOUNTER — Other Ambulatory Visit: Payer: Self-pay | Admitting: General Surgery

## 2022-09-18 ENCOUNTER — Encounter: Payer: Self-pay | Admitting: *Deleted

## 2022-09-18 ENCOUNTER — Inpatient Hospital Stay: Payer: 59 | Attending: Hematology and Oncology | Admitting: Hematology and Oncology

## 2022-09-18 VITALS — BP 125/79 | HR 120 | Temp 97.7°F | Resp 18 | Ht 67.0 in | Wt 134.2 lb

## 2022-09-18 DIAGNOSIS — D0512 Intraductal carcinoma in situ of left breast: Secondary | ICD-10-CM | POA: Diagnosis not present

## 2022-09-18 DIAGNOSIS — Z170 Estrogen receptor positive status [ER+]: Secondary | ICD-10-CM | POA: Diagnosis not present

## 2022-09-18 DIAGNOSIS — C50911 Malignant neoplasm of unspecified site of right female breast: Secondary | ICD-10-CM

## 2022-09-18 DIAGNOSIS — Z881 Allergy status to other antibiotic agents status: Secondary | ICD-10-CM | POA: Diagnosis not present

## 2022-09-18 DIAGNOSIS — Z9013 Acquired absence of bilateral breasts and nipples: Secondary | ICD-10-CM | POA: Insufficient documentation

## 2022-09-18 DIAGNOSIS — Z880 Allergy status to penicillin: Secondary | ICD-10-CM | POA: Diagnosis not present

## 2022-09-18 DIAGNOSIS — C50912 Malignant neoplasm of unspecified site of left female breast: Secondary | ICD-10-CM | POA: Diagnosis not present

## 2022-09-18 DIAGNOSIS — Z79810 Long term (current) use of selective estrogen receptor modulators (SERMs): Secondary | ICD-10-CM | POA: Insufficient documentation

## 2022-09-18 DIAGNOSIS — Z79899 Other long term (current) drug therapy: Secondary | ICD-10-CM | POA: Diagnosis not present

## 2022-09-18 NOTE — Progress Notes (Signed)
Patient Care Team: Merri Brunette, MD as PCP - General (Family Medicine) Serena Croissant, MD as Consulting Physician (Hematology and Oncology) Glenna Fellows, MD as Consulting Physician (Plastic Surgery) Dorothy Puffer, MD as Consulting Physician (Radiation Oncology) [name], MD as Consulting Physician (General Surgery)  DIAGNOSIS:  Encounter Diagnosis  Name Primary?   Bilateral malignant neoplasm of breast in female, estrogen receptor positive, unspecified site of breast (HCC) Yes    SUMMARY OF ONCOLOGIC HISTORY: Oncology History  Bilateral breast cancer (HCC)  11/01/2020 Initial Diagnosis   Right breast biopsy UOQ 9:30 position: Grade 2 IDC with DCIS ER 100%, PR [place], Ki-67 2%, HER2 negative Right breast biopsy LIQ: Grade 1 IDC with DCIS Left breast biopsy UOQ: DCIS intermediate grade    11/01/2020 Cancer Staging   Staging form: Breast, AJCC 8th Edition - Clinical: Stage IIA (cT3, cN0, cM0, G2, ER+, PR+, HER2-) - Signed by Serena Croissant, MD on 11/01/2020 Stage prefix: Initial diagnosis Histologic grading system: 3 grade system   11/16/2020 Genetic Testing   Negative genetic testing on the CancerNext-Expanded+RNAinsight.  The report date is 11/16/2020  The CancerNext-Expanded gene panel offered by [facility] and includes sequencing and rearrangement analysis for the following 77 genes: AIP, ALK, APC*, ATM*, AXIN2, BAP1, BARD1, BLM, BMPR1A, BRCA1*, BRCA2*, BRIP1*, CDC73, CDH1*, CDK4, CDKN1B, CDKN2A, CHEK2*, CTNNA1, DICER1, FANCC, FH, FLCN, GALNT12, KIF1B, LZTR1, MAX, MEN1, MET, MLH1*, MSH2*, MSH3, MSH6*, MUTYH*, NBN, NF1*, NF2, NTHL1, PALB2*, PHOX2B, PMS2*, POT1, PRKAR1A, PTCH1, PTEN*, RAD51C*, RAD51D*, RB1, RECQL, RET, SDHA, SDHAF2, SDHB, SDHC, SDHD, SMAD4, SMARCA4, SMARCB1, SMARCE1, STK11, SUFU, TMEM127, TP53*, TSC1, TSC2, VHL and XRCC2 (sequencing and deletion/duplication); EGFR, EGLN1, HOXB13, KIT, MITF, PDGFRA, POLD1, and POLE (sequencing only); EPCAM and  GREM1 (deletion/duplication only). DNA and RNA analyses performed for * genes.    12/06/2020 Surgery   Bilateral Mastectomies:  Left Mastectomy:HG DCIS 2.2 cm with necrosis, Margins Neg ER 90%, PR 40% Right Mastectomy: Grade 2 IDC with DCIS 3.8 cm, 1/3 LN ER 100%, PR 100%, Her 2 Neg, Ki 67: 2-15%   12/13/2020 Miscellaneous   MammaPrint: Luminal type A, low risk   01/27/2021 - 03/14/2021 Radiation Therapy   Site Technique Total Dose (Gy) Dose per Fx (Gy) Completed Fx Beam Energies  Chest Wall, Right: CW_R 3D 50.4/50.4 1.8 28/28 6XFFF  Chest Wall, Right: CW_R_SCLV 3D 50.4/50.4 1.8 28/28 6X, 10X  Chest Wall, Right: CW_R_Bst Electron 10/10 2 5/5 6E     03/22/2021 -  Anti-estrogen oral therapy   Adjuvant tamoxifen   Ductal carcinoma in situ (DCIS) of left breast  01/03/2021 Initial Diagnosis   Ductal carcinoma in situ (DCIS) of left breast   06/21/2021 Cancer Staging   Staging form: Breast, AJCC 8th Edition - Clinical: Stage 0 (cTis (DCIS), cN0, cM0, ER+, PR+) - Signed by Loa Socks, NP on 06/21/2021     CHIEF COMPLIANT:  Follow-up of bilateral breast cancer recurrence  INTERVAL HISTORY: Kaitlin [name] is a 47 y.o. with above-mentioned history of bilateral breast cancer having undergone bilateral mastectomies, currently on radiation therapy. She presents to the clinic today for follow-up.    ALLERGIES:  is allergic to ceclor [cefaclor], cephalosporins, rizatriptan, amoxicillin, and penicillins.  MEDICATIONS:  Current Outpatient Medications  Medication Sig Dispense Refill   ALPRAZolam (XANAX) 0.25 MG tablet Take 1 tablet (0.25 mg total) by mouth 2 (two) times daily as needed for anxiety. 30 tablet 0   Ascorbic Acid (VITAMIN C PO) Take by mouth.     Cholecalciferol (VITAMIN D3 PO)  Take by mouth. D3 with K2     gabapentin (NEURONTIN) 300 MG capsule Take by mouth.     MAGNESIUM PO Take by mouth.     Misc Natural Products (CORTISOL MANAGER) TABS as directed Orally      Multiple Vitamin (MULTI-VITAMIN) tablet Take 1 tablet by mouth daily.     Omega-3 Fatty Acids (FISH OIL) 300 MG CAPS 1 capsule Orally Three times a day     OVER THE COUNTER MEDICATION Elderberry Syrup. 30 ml daily.     tamoxifen (NOLVADEX) 20 MG tablet TAKE 1 TABLET BY MOUTH DAILY 30 tablet 3   escitalopram (LEXAPRO) 5 MG tablet Take 5 mg by mouth.     No current facility-administered medications for this visit.    PHYSICAL EXAMINATION: ECOG PERFORMANCE STATUS: 1 - Symptomatic but completely ambulatory  Vitals:   09/18/22 0854  BP: 125/79  Pulse: (!) 120  Resp: 18  Temp: 97.7 F (36.5 C)  SpO2: 100%   Filed Weights   09/18/22 0854  Weight: 134 lb 3.2 oz (60.9 kg)      LABORATORY DATA:  I have reviewed the data as listed    Latest Ref Rng & Units 04/06/2011   11:20 AM  CMP  Glucose 70 - 99 mg/dL 89   BUN 6 - 23 mg/dL 11   Creatinine 0.4 - 1.2 mg/dL 0.7   Sodium 161 - 096 mEq/L 140   Potassium 3.5 - 5.1 mEq/L 4.4   Chloride 96 - 112 mEq/L 107   CO2 19 - 32 mEq/L 28   Calcium 8.4 - 10.5 mg/dL 9.3     Lab Results  Component Value Date   WBC 5.6 04/11/2022   HGB 11.9 04/11/2022   HCT 35.9 04/11/2022   MCV 94.2 04/11/2022   PLT 264 04/11/2022   NEUTROABS 2.0 04/06/2011    ASSESSMENT & PLAN:  Bilateral breast cancer (HCC) 12/06/20: Bilateral Mastectomies:  Left Mastectomy:HG DCIS 2.2 cm with necrosis, Margins Neg ER 90%, PR 40% Right Mastectomy: Grade 2 IDC with DCIS 3.8 cm, 1/3 LN ER 100%, PR 100%, Her 2 Neg, Ki 67: 2-15%  Treatment Plan: 1. Mammaprint: Luminal type a low risk 2. Adjuvant XRT completed 03/14/2021 3. Adjuvant Anti-estrogen therapy with tamoxifen to start 03/22/2021 --------------------------------------------------------------------------------------------------------------------- Right breast palpable mass: Ultrasound: 6 mm, biopsy: Grade 2 IDC ER 95%, PR 95%, Ki67 30%, HER2 2+ by IHC FISH negative  Treatment plan: Breast MRI Surgical  excision PET CT scan for staging Adjuvant ovarian function suppression with anastrozole.  I recommended discontinuation of tamoxifen therapy at this time.  Return to clinic after surgery to discuss final pathology report.  I instructed her that the role of chemotherapy is limited and most likely I would recommend ovarian function suppression with hormone therapy to prevent any recurrences. Future surveillance: Continue with Signatera testing and conduct breast MRIs for surveillance periodically and scans on an as-needed basis.     No orders of the defined types were placed in this encounter.  The patient has a good understanding of the overall plan. she agrees with it. she will call with any problems that may develop before the next visit here. Total time spent: 30 mins including face to face time and time spent for planning, charting and co-ordination of care   Tamsen Meek, MD 09/18/22    I Janan Ridge am acting as a Neurosurgeon for Dr.Eriyah Fernando  I have reviewed the above documentation for accuracy and completeness, and I agree with the  above.

## 2022-09-18 NOTE — Assessment & Plan Note (Signed)
12/06/20: Bilateral Mastectomies:  Left Mastectomy:HG DCIS 2.2 cm with necrosis, Margins Neg ER 90%, PR 40% Right Mastectomy: Grade 2 IDC with DCIS 3.8 cm, 1/3 LN ER 100%, PR 100%, Her 2 Neg, Ki 67: 2-15%  Treatment Plan: 1. Mammaprint: Luminal type a low risk 2. Adjuvant XRT completed 03/14/2021 3. Adjuvant Anti-estrogen therapy with tamoxifen to start 03/22/2021 --------------------------------------------------------------------------------------------------------------------- Right breast palpable mass: Ultrasound: 6 mm, biopsy: Grade 2 IDC ER 95%, PR 95%, Ki67 30%, HER2 2+ by IHC FISH negative  Treatment plan: Breast MRI Surgical excision PET CT scan for staging  Return to clinic after surgery to discuss final pathology report.  I instructed her that the role of chemotherapy is limited and most likely I would recommend ovarian function suppression with hormone therapy to prevent any recurrences.

## 2022-09-19 ENCOUNTER — Ambulatory Visit
Admission: RE | Admit: 2022-09-19 | Discharge: 2022-09-19 | Disposition: A | Discharge status: Home / Self Care | Payer: 59 | Source: Ambulatory Visit | Attending: General Surgery | Admitting: General Surgery

## 2022-09-19 DIAGNOSIS — C50911 Malignant neoplasm of unspecified site of right female breast: Secondary | ICD-10-CM

## 2022-09-19 MED ORDER — GADOPICLENOL 0.5 MMOL/ML IV SOLN
6.0000 mL | Freq: Once | INTRAVENOUS | Status: AC | PRN
  Administered 2022-09-19: 6 mL via INTRAVENOUS

## 2022-09-20 ENCOUNTER — Encounter: Payer: Self-pay | Admitting: *Deleted

## 2022-09-25 ENCOUNTER — Ambulatory Visit: Payer: 59 | Admitting: Hematology and Oncology

## 2022-09-25 ENCOUNTER — Other Ambulatory Visit: Payer: Self-pay

## 2022-09-25 ENCOUNTER — Encounter (HOSPITAL_COMMUNITY): Payer: Self-pay | Admitting: General Surgery

## 2022-09-25 ENCOUNTER — Ambulatory Visit
Admission: RE | Admit: 2022-09-25 | Discharge: 2022-09-25 | Disposition: A | Discharge status: Home / Self Care | Payer: 59 | Source: Ambulatory Visit | Attending: Hematology and Oncology | Admitting: Hematology and Oncology

## 2022-09-25 ENCOUNTER — Other Ambulatory Visit: Payer: Self-pay | Admitting: General Surgery

## 2022-09-25 DIAGNOSIS — C50911 Malignant neoplasm of unspecified site of right female breast: Secondary | ICD-10-CM | POA: Insufficient documentation

## 2022-09-25 DIAGNOSIS — C50912 Malignant neoplasm of unspecified site of left female breast: Secondary | ICD-10-CM | POA: Diagnosis not present

## 2022-09-25 DIAGNOSIS — Z9013 Acquired absence of bilateral breasts and nipples: Secondary | ICD-10-CM | POA: Diagnosis not present

## 2022-09-25 LAB — GLUCOSE, CAPILLARY: Glucose-Capillary: 69 mg/dL — ABNORMAL LOW (ref 70–99)

## 2022-09-25 MED ORDER — FLUDEOXYGLUCOSE F - 18 (FDG) INJECTION
7.1100 | Freq: Once | INTRAVENOUS | Status: AC | PRN
  Administered 2022-09-25: 7.11 via INTRAVENOUS

## 2022-09-25 NOTE — Progress Notes (Signed)
SDW call  Patient was given pre-op instructions over the phone. Patient verbalized understanding of instructions provided.     PCP - Dr. Merri Brunette Cardiologist - Dr. Nolon Rod, [facility]:    PPM/ICD - denies Device Orders - n/a Rep Notified - n/a   Chest x-ray - n/a EKG -  n/a Stress Test - ECHO -  Cardiac Cath -   Sleep Study/sleep apnea/CPAP: denies  Non-diabetic   Blood Thinner Instructions: denies Aspirin Instructions:denies   ERAS Protcol - Yes, clear fluids until 0700  COVID TEST- n/a    Anesthesia review: No   Patient denies shortness of breath, fever, cough and chest pain over the phone call  Your procedure is scheduled on Wednesday September 26, 2022  Report to [facility] Main Entrance "A" at 0730   A.M., then check in with the Admitting office.  Call this number if you have problems the morning of surgery:  [phone]   If you have any questions prior to your surgery date call [phone]: Open Monday-Friday 8am-4pm If you experience any cold or flu symptoms such as cough, fever, chills, shortness of breath, etc. between now and your scheduled surgery, please notify us at the above number     Remember:  Do not eat after midnight the night before your surgery  You may drink clear liquids until  0700   the morning of your surgery.   Clear liquids allowed are: Water, Non-Citrus Juices (without pulp), Carbonated Beverages, Clear Tea, Black Coffee ONLY (NO MILK, CREAM OR POWDERED CREAMER of any kind), and Gatorade   Take these medicines the morning of surgery with A SIP OF WATER: None   As of today, STOP taking any Aspirin (unless otherwise instructed by your surgeon) Aleve, Naproxen, Ibuprofen, Motrin, Advil, Goody's, BC's, all herbal medications, fish oil, and all vitamins.

## 2022-09-26 ENCOUNTER — Ambulatory Visit (HOSPITAL_COMMUNITY)
Admission: RE | Admit: 2022-09-26 | Discharge: 2022-09-26 | Disposition: A | Discharge status: Home / Self Care | Payer: 59 | Attending: General Surgery | Admitting: General Surgery

## 2022-09-26 ENCOUNTER — Encounter: Payer: Self-pay | Admitting: *Deleted

## 2022-09-26 ENCOUNTER — Other Ambulatory Visit: Payer: Self-pay

## 2022-09-26 ENCOUNTER — Encounter (HOSPITAL_COMMUNITY)
Admission: RE | Disposition: A | Discharge status: Home / Self Care | Payer: Self-pay | Source: Home / Self Care | Attending: General Surgery

## 2022-09-26 ENCOUNTER — Ambulatory Visit (HOSPITAL_BASED_OUTPATIENT_CLINIC_OR_DEPARTMENT_OTHER): Payer: 59

## 2022-09-26 ENCOUNTER — Ambulatory Visit (HOSPITAL_COMMUNITY): Payer: 59

## 2022-09-26 ENCOUNTER — Other Ambulatory Visit: Payer: 59

## 2022-09-26 ENCOUNTER — Encounter (HOSPITAL_COMMUNITY): Payer: Self-pay | Admitting: General Surgery

## 2022-09-26 DIAGNOSIS — Z923 Personal history of irradiation: Secondary | ICD-10-CM | POA: Diagnosis not present

## 2022-09-26 DIAGNOSIS — Z79810 Long term (current) use of selective estrogen receptor modulators (SERMs): Secondary | ICD-10-CM | POA: Diagnosis not present

## 2022-09-26 DIAGNOSIS — C50911 Malignant neoplasm of unspecified site of right female breast: Secondary | ICD-10-CM

## 2022-09-26 DIAGNOSIS — Z170 Estrogen receptor positive status [ER+]: Secondary | ICD-10-CM | POA: Diagnosis not present

## 2022-09-26 DIAGNOSIS — Z87891 Personal history of nicotine dependence: Secondary | ICD-10-CM | POA: Diagnosis not present

## 2022-09-26 DIAGNOSIS — Z803 Family history of malignant neoplasm of breast: Secondary | ICD-10-CM | POA: Insufficient documentation

## 2022-09-26 DIAGNOSIS — J45909 Unspecified asthma, uncomplicated: Secondary | ICD-10-CM | POA: Insufficient documentation

## 2022-09-26 DIAGNOSIS — Z9013 Acquired absence of bilateral breasts and nipples: Secondary | ICD-10-CM | POA: Insufficient documentation

## 2022-09-26 HISTORY — PX: BREAST CYST EXCISION: SHX579

## 2022-09-26 LAB — BASIC METABOLIC PANEL
Anion gap: 12 (ref 5–15)
BUN: 14 mg/dL (ref 6–20)
CO2: 20 mmol/L — ABNORMAL LOW (ref 22–32)
Calcium: 8.8 mg/dL — ABNORMAL LOW (ref 8.9–10.3)
Chloride: 107 mmol/L (ref 98–111)
Creatinine, Ser: 0.78 mg/dL (ref 0.44–1.00)
GFR, Estimated: >60 mL/min (ref >60)
Glucose, Bld: 98 mg/dL (ref 70–99)
Potassium: 4.1 mmol/L (ref 3.5–5.1)
Sodium: 139 mmol/L (ref 135–145)

## 2022-09-26 LAB — CBC
HCT: 34.8 % — ABNORMAL LOW (ref 36.0–46.0)
Hemoglobin: 11.4 g/dL — ABNORMAL LOW (ref 12.0–15.0)
MCH: 30.5 pg (ref 26.0–34.0)
MCHC: 32.8 g/dL (ref 30.0–36.0)
MCV: 93 fL (ref 80.0–100.0)
Platelets: 255 10*3/uL (ref 150–400)
RBC: 3.74 MIL/uL — ABNORMAL LOW (ref 3.87–5.11)
RDW: 13.4 % (ref 11.5–15.5)
WBC: 4.3 10*3/uL (ref 4.0–10.5)
nRBC: 0 % (ref 0.0–0.2)

## 2022-09-26 LAB — POCT PREGNANCY, URINE: Preg Test, Ur: NEGATIVE

## 2022-09-26 SURGERY — EXCISION, CYST, BREAST
Anesthesia: General | Site: Breast | Laterality: Right

## 2022-09-26 MED ORDER — ONDANSETRON HCL 4 MG/2ML IJ SOLN
INTRAMUSCULAR | Status: AC
  Filled 2022-09-26: qty 2

## 2022-09-26 MED ORDER — PROPOFOL 1000 MG/100ML IV EMUL
INTRAVENOUS | Status: AC
  Filled 2022-09-26: qty 100

## 2022-09-26 MED ORDER — PHENYLEPHRINE 80 MCG/ML (10ML) SYRINGE FOR IV PUSH (FOR BLOOD PRESSURE SUPPORT)
PREFILLED_SYRINGE | INTRAVENOUS | Status: DC | PRN
  Administered 2022-09-26 (×2): 80 ug via INTRAVENOUS

## 2022-09-26 MED ORDER — ONDANSETRON HCL 4 MG/2ML IJ SOLN: 4.0000 mg | Freq: Once | INTRAMUSCULAR | Status: DC | PRN

## 2022-09-26 MED ORDER — BUPIVACAINE-EPINEPHRINE (PF) 0.25% -1:200000 IJ SOLN
INTRAMUSCULAR | Status: AC
  Filled 2022-09-26: qty 30

## 2022-09-26 MED ORDER — OXYCODONE HCL 5 MG/5ML PO SOLN: 5.0000 mg | Freq: Once | ORAL | Status: DC | PRN

## 2022-09-26 MED ORDER — CHLORHEXIDINE GLUCONATE 0.12 % MT SOLN
OROMUCOSAL | Status: AC
  Administered 2022-09-26: 15 mL via OROMUCOSAL
  Filled 2022-09-26: qty 15

## 2022-09-26 MED ORDER — FENTANYL CITRATE (PF) 100 MCG/2ML IJ SOLN: 25.0000 ug | INTRAMUSCULAR | Status: DC | PRN

## 2022-09-26 MED ORDER — PROPOFOL 10 MG/ML IV BOLUS
INTRAVENOUS | Status: DC | PRN
  Administered 2022-09-26: 50 ug via INTRAVENOUS
  Administered 2022-09-26: 150 ug/kg/min via INTRAVENOUS
  Administered 2022-09-26: 100 mg via INTRAVENOUS

## 2022-09-26 MED ORDER — FENTANYL CITRATE (PF) 250 MCG/5ML IJ SOLN
INTRAMUSCULAR | Status: DC | PRN
  Administered 2022-09-26: 50 ug via INTRAVENOUS

## 2022-09-26 MED ORDER — ONDANSETRON HCL 4 MG/2ML IJ SOLN
INTRAMUSCULAR | Status: DC | PRN
  Administered 2022-09-26: 4 mg via INTRAVENOUS

## 2022-09-26 MED ORDER — MIDAZOLAM HCL 2 MG/2ML IJ SOLN
INTRAMUSCULAR | Status: DC | PRN
  Administered 2022-09-26: 2 mg via INTRAVENOUS

## 2022-09-26 MED ORDER — BUPIVACAINE-EPINEPHRINE 0.25% -1:200000 IJ SOLN
INTRAMUSCULAR | Status: DC | PRN
  Administered 2022-09-26: 6 mL

## 2022-09-26 MED ORDER — DEXAMETHASONE SODIUM PHOSPHATE 10 MG/ML IJ SOLN
INTRAMUSCULAR | Status: DC | PRN
  Administered 2022-09-26: 10 mg via INTRAVENOUS

## 2022-09-26 MED ORDER — CHLORHEXIDINE GLUCONATE CLOTH 2 % EX PADS: 6.0000 | MEDICATED_PAD | Freq: Once | CUTANEOUS | Status: DC

## 2022-09-26 MED ORDER — ACETAMINOPHEN 10 MG/ML IV SOLN: 1000.0000 mg | Freq: Once | INTRAVENOUS | Status: DC | PRN

## 2022-09-26 MED ORDER — ACETAMINOPHEN 500 MG PO TABS
1000.0000 mg | ORAL_TABLET | ORAL | Status: AC
  Administered 2022-09-26: 1000 mg via ORAL
  Filled 2022-09-26: qty 2

## 2022-09-26 MED ORDER — DEXAMETHASONE SODIUM PHOSPHATE 10 MG/ML IJ SOLN
INTRAMUSCULAR | Status: AC
  Filled 2022-09-26: qty 1

## 2022-09-26 MED ORDER — 0.9 % SODIUM CHLORIDE (POUR BTL) OPTIME
TOPICAL | Status: DC | PRN
Start: 2022-09-26 — End: 2022-09-26
  Administered 2022-09-26: 1000 mL

## 2022-09-26 MED ORDER — LACTATED RINGERS IV SOLN: INTRAVENOUS | Status: DC

## 2022-09-26 MED ORDER — CHLORHEXIDINE GLUCONATE 0.12 % MT SOLN: 15.0000 mL | Freq: Once | OROMUCOSAL | Status: AC

## 2022-09-26 MED ORDER — FENTANYL CITRATE (PF) 250 MCG/5ML IJ SOLN
INTRAMUSCULAR | Status: AC
  Filled 2022-09-26: qty 5

## 2022-09-26 MED ORDER — CIPROFLOXACIN IN D5W 400 MG/200ML IV SOLN
400.0000 mg | INTRAVENOUS | Status: AC
  Administered 2022-09-26: 400 mg via INTRAVENOUS
  Filled 2022-09-26: qty 200

## 2022-09-26 MED ORDER — ORAL CARE MOUTH RINSE: 15.0000 mL | Freq: Once | OROMUCOSAL | Status: AC

## 2022-09-26 MED ORDER — LIDOCAINE 2% (20 MG/ML) 5 ML SYRINGE
INTRAMUSCULAR | Status: AC
  Filled 2022-09-26: qty 5

## 2022-09-26 MED ORDER — MIDAZOLAM HCL 2 MG/2ML IJ SOLN
INTRAMUSCULAR | Status: AC
  Filled 2022-09-26: qty 2

## 2022-09-26 MED ORDER — LIDOCAINE 2% (20 MG/ML) 5 ML SYRINGE
INTRAMUSCULAR | Status: DC | PRN
  Administered 2022-09-26: 100 mg via INTRAVENOUS

## 2022-09-26 MED ORDER — OXYCODONE HCL 5 MG PO TABS: 5.0000 mg | ORAL_TABLET | Freq: Once | ORAL | Status: DC | PRN

## 2022-09-26 MED ORDER — KETOROLAC TROMETHAMINE 15 MG/ML IJ SOLN
15.0000 mg | INTRAMUSCULAR | Status: AC
  Administered 2022-09-26: 15 mg via INTRAVENOUS
  Filled 2022-09-26: qty 1

## 2022-09-26 SURGICAL SUPPLY — 36 items
ADH SKN CLS APL DERMABOND .7 (GAUZE/BANDAGES/DRESSINGS) ×1
APL PRP STRL LF DISP 70% ISPRP (MISCELLANEOUS) ×1
BAG COUNTER SPONGE SURGICOUNT (BAG) ×1 IMPLANT
BAG SPNG CNTER NS LX DISP (BAG) ×1
BINDER BREAST XLRG (GAUZE/BANDAGES/DRESSINGS) IMPLANT
CANISTER SUCT 3000ML PPV (MISCELLANEOUS) ×1 IMPLANT
CHLORAPREP W/TINT 26 (MISCELLANEOUS) ×1 IMPLANT
COVER SURGICAL LIGHT HANDLE (MISCELLANEOUS) ×1 IMPLANT
DERMABOND ADVANCED .7 DNX12 (GAUZE/BANDAGES/DRESSINGS) ×1 IMPLANT
DRAPE CHEST BREAST 15X10 FENES (DRAPES) ×1 IMPLANT
ELECT CAUTERY BLADE 6.4 (BLADE) ×1 IMPLANT
ELECT REM PT RETURN 9FT ADLT (ELECTROSURGICAL) ×1
ELECTRODE REM PT RTRN 9FT ADLT (ELECTROSURGICAL) ×1 IMPLANT
GLOVE BIO SURGEON STRL SZ7 (GLOVE) ×1 IMPLANT
GLOVE BIOGEL PI IND STRL 7.5 (GLOVE) ×1 IMPLANT
GOWN STRL REUS W/ TWL LRG LVL3 (GOWN DISPOSABLE) ×2 IMPLANT
GOWN STRL REUS W/TWL LRG LVL3 (GOWN DISPOSABLE) ×2
KIT BASIN OR (CUSTOM PROCEDURE TRAY) ×1 IMPLANT
KIT TURNOVER KIT B (KITS) ×1 IMPLANT
NDL HYPO 25GX1X1/2 BEV (NEEDLE) ×1 IMPLANT
NEEDLE HYPO 25GX1X1/2 BEV (NEEDLE) ×1
NS IRRIG 1000ML POUR BTL (IV SOLUTION) ×1 IMPLANT
PACK GENERAL/GYN (CUSTOM PROCEDURE TRAY) ×1 IMPLANT
PAD ARMBOARD 7.5X6 YLW CONV (MISCELLANEOUS) ×1 IMPLANT
SPIKE FLUID TRANSFER (MISCELLANEOUS) ×1 IMPLANT
STRIP CLOSURE SKIN 1/2X4 (GAUZE/BANDAGES/DRESSINGS) IMPLANT
SUT ETHILON 3 0 FSL (SUTURE) IMPLANT
SUT MNCRL AB 4-0 PS2 18 (SUTURE) IMPLANT
SUT SILK 2 0 SH (SUTURE) IMPLANT
SUT VIC AB 2-0 SH 27 (SUTURE) ×1
SUT VIC AB 2-0 SH 27XBRD (SUTURE) ×1 IMPLANT
SUT VIC AB 3-0 SH 27 (SUTURE) ×1
SUT VIC AB 3-0 SH 27X BRD (SUTURE) ×1 IMPLANT
SYR CONTROL 10ML LL (SYRINGE) ×1 IMPLANT
TOWEL GREEN STERILE (TOWEL DISPOSABLE) ×1 IMPLANT
TOWEL GREEN STERILE FF (TOWEL DISPOSABLE) ×1 IMPLANT

## 2022-09-26 NOTE — Anesthesia Preprocedure Evaluation (Signed)
Anesthesia Evaluation  Patient identified by MRN, date of birth, ID band Patient awake    Reviewed: Allergy & Precautions, NPO status , Patient's Chart, lab work & pertinent test results, reviewed documented beta blocker date and time   History of Anesthesia Complications (+) PONV and history of anesthetic complications  Airway Mallampati: I  TM Distance: >3 FB     Dental no notable dental hx.    Pulmonary neg shortness of breath, asthma , former smoker, neg PE   breath sounds clear to auscultation       Cardiovascular (-) hypertension(-) angina (-) CAD, (-) Past MI, (-) CABG, (-) CHF and (-) Orthopnea + Valvular Problems/Murmurs  Rhythm:Regular Rate:Normal  Positive for HOCM genetic mutation, asymptomatic, last TTE normal per her report   Neuro/Psych  Headaches, neg Seizures    GI/Hepatic ,neg GERD  ,,(+) neg Cirrhosis        Endo/Other  neg diabetes    Renal/GU Renal disease     Musculoskeletal   Abdominal   Peds  Hematology  (+) Blood dyscrasia, anemia   Anesthesia Other Findings   Reproductive/Obstetrics                             Anesthesia Physical Anesthesia Plan  ASA: 2  Anesthesia Plan: General   Post-op Pain Management:    Induction: Intravenous  PONV Risk Score and Plan: 4 or greater and TIVA and Ondansetron  Airway Management Planned: LMA  Additional Equipment:   Intra-op Plan:   Post-operative Plan: Extubation in OR  Informed Consent: I have reviewed the patients History and Physical, chart, labs and discussed the procedure including the risks, benefits and alternatives for the proposed anesthesia with the patient or authorized representative who has indicated his/her understanding and acceptance.     Dental advisory given  Plan Discussed with: CRNA  Anesthesia Plan Comments:        Anesthesia Quick Evaluation

## 2022-09-26 NOTE — Discharge Instructions (Signed)
[facility] Office Phone Number [phone]  POST OP INSTRUCTIONS Take 400 mg of ibuprofen every 8 hours or 650 mg tylenol every 6 hours for next 72 hours then as needed. Use ice several times daily also.  A prescription for pain medication may be given to you upon discharge.  Take your pain medication as prescribed, if needed.  If narcotic pain medicine is not needed, then you may take acetaminophen (Tylenol), naprosyn (Alleve) or ibuprofen (Advil) as needed. Take your usually prescribed medications unless otherwise directed If you need a refill on your pain medication, please contact your pharmacy.  They will contact our office to request authorization.  Prescriptions will not be filled after 5pm or on week-ends. You should eat very light the first 24 hours after surgery, such as soup, crackers, pudding, etc.  Resume your normal diet the day after surgery. Most patients will experience some swelling and bruising in the breast.  Ice packs and a good support bra will help.  Wear the breast binder provided or a sports bra for 72 hours day and night.  After that wear a sports bra during the day until you return to the office. Swelling and bruising can take several days to resolve.  It is common to experience some constipation if taking pain medication after surgery.  Increasing fluid intake and taking a stool softener will usually help or prevent this problem from occurring.  A mild laxative (Milk of Magnesia or Miralax) should be taken according to package directions if there are no bowel movements after 48 hours. I used skin glue on the incision, you may shower in 24 hours.  The glue will flake off over the next 2-3 weeks.  Any sutures or staples will be removed at the office during your follow-up visit. ACTIVITIES:  You may resume regular daily activities (gradually increasing) beginning the next day.  Wearing a good support bra or sports bra minimizes pain and swelling.  You may have  sexual intercourse when it is comfortable. You may drive when you no longer are taking prescription pain medication, you can comfortably wear a seatbelt, and you can safely maneuver your car and apply brakes. RETURN TO WORK:  ______________________________________________________________________________________ You should see your doctor in the office for a follow-up appointment approximately two weeks after your surgery.  Your doctor's nurse will typically make your follow-up appointment when she calls you with your pathology report.  Expect your pathology report 3-4 business days after your surgery.  You may call to check if you do not hear from us after three days. OTHER INSTRUCTIONS: _______________________________________________________________________________________________ _____________________________________________________________________________________________________________________________________ _____________________________________________________________________________________________________________________________________ _____________________________________________________________________________________________________________________________________  WHEN TO CALL DR WAKEFIELD: Fever over 101.0 Nausea and/or vomiting. Extreme swelling or bruising. Continued bleeding from incision. Increased pain, redness, or drainage from the incision.  The clinic staff is available to answer your questions during regular business hours.  Please don't hesitate to call and ask to speak to one of the nurses for clinical concerns.  If you have a medical emergency, go to the nearest emergency room or call 911.  A surgeon from [facility] is always on call at the hospital.  For further questions, please visit [email or web address]  

## 2022-09-26 NOTE — Transfer of Care (Signed)
Immediate Anesthesia Transfer of Care Note  Patient: Kaitlin [name]  Procedure(s) Performed: RIGHT BREAST CANCER RECURRENT EXCISION (Right: Breast)  Patient Location: PACU  Anesthesia Type:General  Level of Consciousness: awake, alert , and oriented  Airway & Oxygen Therapy: Patient Spontanous Breathing  Post-op Assessment: Report given to RN  Post vital signs: Reviewed  Last Vitals:  Vitals Value Taken Time  BP 88/53 09/26/22 1034  Temp    Pulse 61 09/26/22 1036  Resp 12 09/26/22 1036  SpO2 97 % 09/26/22 1036  Vitals shown include unfiled device data.  Last Pain:  Vitals:   09/26/22 0824  TempSrc:   PainSc: 0-No pain         Complications: No notable events documented.

## 2022-09-26 NOTE — Op Note (Signed)
Preoperative diagnosis: Recurrent right breast cancer Postoperative diagnosis: Same as above Procedure: Right breast skin and chest wall reexcision recurrence Surgeon: Dr. Harden Mo Anesthesia: General Estimated blood loss: Minimal Specimens: 1.  Recurrent right breast cancer with paddle of skin overlying it marked short superior, long lateral 2.  Additional inferior margin marked short superior, long lateral, double deep Complications: None Drains: Sponge needle count was correct completion Disposition recovery stable condition  Indications:47 year old female status post bilateral skin sparing mastectomies with expander reconstruction and a right axillary sentinel lymph node biopsy. 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 DCIS does focally involve the anterior margin is widely less than 1 mm from the deep margin. She had 3 sentinel lymph nodes. One of the 3 has metastatic carcinoma. The invasive tumor is 100% ER and PR positive, HER2 negative, Ki-67 is less than 20%. She had a MammaPrint that is low risk. She underwent radiotherapy on the right side and is on tamoxifen nowShe palpated a mass very lateral right breast area. This underwent US that shows a 6 mm mass. Axillary nodes are negative. Biopsy is a grade II IDC.  She has no evidence of any distant disease and her MRI does not show any other abnormalities.  We discussed a local reexcision.  Procedure: After informed consent was obtained she was taken to the operating.  She was given antibiotics.  SCDs were placed.  She was placed under general anesthesia without complication.  She was prepped and draped in standard sterile surgical fashion.  Surgical timeout was then performed.  The mass was palpable and she and I marked this beforehand.  I made an elliptical incision to include the skin overlying the  mass.  I then dissected down and was able to identify her implant as well as her chest wall.  I then excised the mass in the surrounding tissue to get a clear margin.  The implant remained without injury.  I then removed some additional inferior margin just to make sure I was clear.  This was passed off the table.  I then obtained hemostasis.  I closed the deep tissue with 2-0 Vicryl.  The skin was closed with 3-0 Vicryl and 4 Monocryl.  I placed one 3-0 nylon suture at the midpoint of the incision to keep this together.  Glue and some Steri-Strips were applied.  She tolerated this well was extubated transferred recovery stable.

## 2022-09-26 NOTE — H&P (Signed)
47 year old female status post bilateral skin sparing mastectomies with expander reconstruction and a right axillary sentinel lymph node biopsy. 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 DCIS does focally involve the anterior margin is widely less than 1 mm from the deep margin. She had 3 sentinel lymph nodes. One of the 3 has metastatic carcinoma. The invasive tumor is 100% ER and PR positive, HER2 negative, Ki-67 is less than 20%. She had a MammaPrint that is low risk. She underwent radiotherapy on the right side and is on tamoxifen now. Having alot of issues with that She has had implants placed 8/23.  She palpated a mass very lateral right breast area. This underwent US that shows a 6 mm mass. Axillary nodes are negative. Biopsy is a grade II IDC, no LVI, prog panel pending. She is here to discuss options  Review of Systems: A complete review of systems was obtained from the patient. I have reviewed this information and discussed as appropriate with the patient. See HPI as well for other ROS.  Review of Systems  All other systems reviewed and are negative.  Medical History: History reviewed. No pertinent past medical history.  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)  Bilateral breast cancer (CMS/HHS-HCC)  Cellulitis of finger of left hand  Fracture of great toe  LLQ abdominal pain  Pain in right foot   Past Surgical History:  Procedure Laterality Date  MASTECTOMY SIMPLE Bilateral 11/25/2020  CERVICAL BIOPSY W/ LOOP ELECTRODE EXCISION  DEEP AXILLARY SENTINEL NODE BIOPSY / EXCISION Right  lipoma Bilateral  TONSILLECTOMY   Allergies  Allergen Reactions  Ceclor [Cefaclor] Anaphylaxis  Rizatriptan Anaphylaxis  Amoxicillin Hives    Current Outpatient Medications on File Prior to Visit  Medication Sig Dispense Refill  acetylcysteine (N-ACETYL-L-CYSTEINE MISC) Use  ALPRAZolam (XANAX) 0.25 MG tablet Take 1 tablet by mouth 3 (three) times daily  ascorbic acid, vitamin C, (VITAMIN C) 125 mg Chew Take by mouth once daily  calcium/magnesium (CALCIUM AND MAGNESIUM ORAL) Take by mouth once daily  cholecalciferol (VITAMIN D3) 2,000 unit capsule Take 2,000 Units by mouth once daily  diazepam (VALIUM ORAL) Take by mouth  ELDERBERRY FRUIT-HONEY ORAL Take 1 Tbsp by mouth once daily.  Lactobacillus acidophilus (PROBIOTIC) 10 billion cell Cap Take by mouth once daily  mecobalamin (B12 ACTIVE ORAL) Take by mouth once daily  multivitamin tablet Take 1 tablet by mouth once daily (Patient not taking: Reported on 03/30/2022)  ondansetron (ZOFRAN-ODT) 4 MG disintegrating tablet Take 4 mg by mouth every 8 (eight) hours as needed (Patient not taking: Reported on 03/30/2022) 0  tamoxifen (NOLVADEX) 20 MG tablet  vitamins A,C,and E-selenium Cap Take by mouth once daily (Patient not taking: Reported on 03/30/2022)  zinc 50 mg Tab Take by mouth once daily (Patient not taking: Reported on 03/30/2022)   Family History  Problem Relation Age of Onset  Hypertrophic cardiomyopathy Father 81  asymptomatic carrier of familial mutation MYBPC3 E542Q  Hypertrophic cardiomyopathy Paternal Uncle 75  h/o HCM; proband in family; MYBPC3 E542Q positive  Hypertrophic cardiomyopathy Paternal Aunt 19  asymptomatic carrier of family mutation MYBPC3 E542Q  Sudden cardiac death Neg Hx    Social History   Tobacco Use  Smoking Status Former  Current packs/day: 0.00  Types: Cigarettes  Quit date: 10/07/2002  Years since  quitting: 19.9  Smokeless Tobacco Never  Tobacco Comments  about 2 cigarette per day socially for just 2-3 yrs  Marital status: Married  Tobacco Use  Smoking status: Former  Current packs/day: 0.00  Types: Cigarettes  Quit date: 10/07/2002   Years since quitting: 19.9  Smokeless tobacco: Never  Tobacco comments:  about 2 cigarette per day socially for just 2-3 yrs  Substance and Sexual Activity  Alcohol use: No  Drug use: No   Objective:   Vitals:  09/14/22 0857   Physical Exam Vitals reviewed.  Constitutional:  Appearance: Normal appearance.  Chest:  Comments: S/p bilateral mastectomies with implants in place Very lateral right breast with small mass 5 mm (steristrip from biopsy overyling), mobile nontender Neurological:  Mental Status: She is alert.   Assessment and Plan:   Recurrent breast cancer, right (CMS/HHS-HCC)   We discussed recurrence. This is not where dcis was positive anteriorly. Need some more information. I recommended an MRI to ensure nothing more. Also recommended staging and discussed with oncology who will order staging.  Will also await prog panel as well. Need to do wide local excision of this area. Will await prog panel and MRI (although [place] negative) to determine if we need a sn biopsy (I dont think will need it) She will see oncology next week. She asked about excising all the skin and removing implants and I don't think there is any value to that.

## 2022-09-26 NOTE — Anesthesia Postprocedure Evaluation (Signed)
Anesthesia Post Note  Patient: Kaitlin [name]  Procedure(s) Performed: RIGHT BREAST CANCER RECURRENT EXCISION (Right: Breast)     Patient location during evaluation: PACU Anesthesia Type: General Level of consciousness: awake and alert Pain management: pain level controlled Vital Signs Assessment: post-procedure vital signs reviewed and stable Respiratory status: spontaneous breathing, nonlabored ventilation, respiratory function stable and patient connected to nasal cannula oxygen Cardiovascular status: blood pressure returned to baseline and stable Postop Assessment: no apparent nausea or vomiting Anesthetic complications: no   No notable events documented.  Last Vitals:  Vitals:   09/26/22 1100 09/26/22 1115  BP: (!) 97/57 (!) 100/59  Pulse: (!) 59 (!) 55  Resp: 13 17  Temp:  36.9 C  SpO2: 100% 100%    Last Pain:  Vitals:   09/26/22 1115  TempSrc:   PainSc: 0-No pain                 Mariann Barter

## 2022-09-26 NOTE — Interval H&P Note (Signed)
History and Physical Interval Note:  09/26/2022 9:21 AM  Kaitlin [name]  has presented today for surgery, with the diagnosis of RECURRENT RIGHT BREAST CANCER.  The various methods of treatment have been discussed with the patient and family. After consideration of risks, benefits and other options for treatment, the patient has consented to  Procedure(s) with comments: RIGHT BREAST CANCER RECURRENT EXCISION (Right) - LMA as a surgical intervention.  The patient's history has been reviewed, patient examined, no change in status, stable for surgery.  I have reviewed the patient's chart and labs.  Questions were answered to the patient's satisfaction.     [name]

## 2022-09-26 NOTE — Anesthesia Procedure Notes (Signed)
Procedure Name: LMA Insertion Date/Time: 09/26/2022 9:52 AM  Performed by: Georgianne Fick D, CRNAPre-anesthesia Checklist: Patient identified, Emergency Drugs available, Suction available and Patient being monitored Patient Re-evaluated:Patient Re-evaluated prior to induction Oxygen Delivery Method: Circle System Utilized Preoxygenation: Pre-oxygenation with 100% oxygen Induction Type: IV induction Ventilation: Mask ventilation without difficulty LMA: LMA inserted LMA Size: 4.0 Number of attempts: 1 Airway Equipment and Method: Bite block Placement Confirmation: positive ETCO2 Tube secured with: Tape Dental Injury: Teeth and Oropharynx as per pre-operative assessment

## 2022-09-27 ENCOUNTER — Other Ambulatory Visit: Payer: 59

## 2022-09-27 ENCOUNTER — Other Ambulatory Visit: Payer: 59 | Admitting: Obstetrics and Gynecology

## 2022-09-27 ENCOUNTER — Other Ambulatory Visit (HOSPITAL_COMMUNITY): Payer: 59

## 2022-09-27 ENCOUNTER — Encounter (HOSPITAL_COMMUNITY): Payer: Self-pay | Admitting: General Surgery

## 2022-09-28 ENCOUNTER — Other Ambulatory Visit (HOSPITAL_COMMUNITY): Payer: 59

## 2022-09-28 LAB — SURGICAL PATHOLOGY

## 2022-10-01 NOTE — Progress Notes (Signed)
Patient Care Team: Merri Brunette, MD as PCP - General (Family Medicine) Serena Croissant, MD as Consulting Physician (Hematology and Oncology) Glenna Fellows, MD as Consulting Physician (Plastic Surgery) Dorothy Puffer, MD as Consulting Physician (Radiation Oncology) [name], MD as Consulting Physician (General Surgery)  DIAGNOSIS: No diagnosis found.  SUMMARY OF ONCOLOGIC HISTORY: Oncology History  Bilateral breast cancer (HCC)  11/01/2020 Initial Diagnosis   Right breast biopsy UOQ 9:30 position: Grade 2 IDC with DCIS ER 100%, PR [place], Ki-67 2%, HER2 negative Right breast biopsy LIQ: Grade 1 IDC with DCIS Left breast biopsy UOQ: DCIS intermediate grade    11/01/2020 Cancer Staging   Staging form: Breast, AJCC 8th Edition - Clinical: Stage IIA (cT3, cN0, cM0, G2, ER+, PR+, HER2-) - Signed by Serena Croissant, MD on 11/01/2020 Stage prefix: Initial diagnosis Histologic grading system: 3 grade system   11/16/2020 Genetic Testing   Negative genetic testing on the CancerNext-Expanded+RNAinsight.  The report date is 11/16/2020  The CancerNext-Expanded gene panel offered by [facility] and includes sequencing and rearrangement analysis for the following 77 genes: AIP, ALK, APC*, ATM*, AXIN2, BAP1, BARD1, BLM, BMPR1A, BRCA1*, BRCA2*, BRIP1*, CDC73, CDH1*, CDK4, CDKN1B, CDKN2A, CHEK2*, CTNNA1, DICER1, FANCC, FH, FLCN, GALNT12, KIF1B, LZTR1, MAX, MEN1, MET, MLH1*, MSH2*, MSH3, MSH6*, MUTYH*, NBN, NF1*, NF2, NTHL1, PALB2*, PHOX2B, PMS2*, POT1, PRKAR1A, PTCH1, PTEN*, RAD51C*, RAD51D*, RB1, RECQL, RET, SDHA, SDHAF2, SDHB, SDHC, SDHD, SMAD4, SMARCA4, SMARCB1, SMARCE1, STK11, SUFU, TMEM127, TP53*, TSC1, TSC2, VHL and XRCC2 (sequencing and deletion/duplication); EGFR, EGLN1, HOXB13, KIT, MITF, PDGFRA, POLD1, and POLE (sequencing only); EPCAM and GREM1 (deletion/duplication only). DNA and RNA analyses performed for * genes.    12/06/2020 Surgery   Bilateral Mastectomies:  Left  Mastectomy:HG DCIS 2.2 cm with necrosis, Margins Neg ER 90%, PR 40% Right Mastectomy: Grade 2 IDC with DCIS 3.8 cm, 1/3 LN ER 100%, PR 100%, Her 2 Neg, Ki 67: 2-15%   12/13/2020 Miscellaneous   MammaPrint: Luminal type A, low risk   01/27/2021 - 03/14/2021 Radiation Therapy   Site Technique Total Dose (Gy) Dose per Fx (Gy) Completed Fx Beam Energies  Chest Wall, Right: CW_R 3D 50.4/50.4 1.8 28/28 6XFFF  Chest Wall, Right: CW_R_SCLV 3D 50.4/50.4 1.8 28/28 6X, 10X  Chest Wall, Right: CW_R_Bst Electron 10/10 2 5/5 6E     03/22/2021 -  Anti-estrogen oral therapy   Adjuvant tamoxifen   Ductal carcinoma in situ (DCIS) of left breast  01/03/2021 Initial Diagnosis   Ductal carcinoma in situ (DCIS) of left breast   06/21/2021 Cancer Staging   Staging form: Breast, AJCC 8th Edition - Clinical: Stage 0 (cTis (DCIS), cN0, cM0, ER+, PR+) - Signed by Loa Socks, NP on 06/21/2021     CHIEF COMPLIANT: Follow-up after surgery  INTERVAL HISTORY: Kaitlin [name] is a 47 y.o. with above-mentioned of recurrence.She presents to the clinic for a follow-up.    ALLERGIES:  is allergic to ceclor [cefaclor], cephalosporins, rizatriptan, amoxicillin, and penicillins.  MEDICATIONS:  Current Outpatient Medications  Medication Sig Dispense Refill   ALPRAZolam (XANAX) 0.25 MG tablet Take 1 tablet (0.25 mg total) by mouth 2 (two) times daily as needed for anxiety. (Patient taking differently: Take 0.125 mg by mouth at bedtime as needed for anxiety or sleep.) 30 tablet 0   GLUTATHIONE PO Take 250 mg by mouth daily.     ibuprofen (ADVIL) 200 MG tablet Take 400 mg by mouth every 6 (six) hours as needed for moderate pain.     Magnesium Glycinate 120 MG  CAPS Take 240 mg by mouth at bedtime.     Misc Natural Products (CORTISOL MANAGER) TABS Take 1 tablet by mouth daily.     Multiple Vitamin (MULTIVITAMIN WITH MINERALS) TABS tablet Take 1 tablet by mouth daily.     Omega-3 Fatty Acids (FISH OIL PO) Take 2  capsules by mouth daily.     tamoxifen (NOLVADEX) 20 MG tablet TAKE 1 TABLET BY MOUTH DAILY (Patient not taking: Reported on 09/19/2022) 30 tablet 3   No current facility-administered medications for this visit.    PHYSICAL EXAMINATION: ECOG PERFORMANCE STATUS: {CHL ONC ECOG PS:[phone]}  There were no vitals filed for this visit. There were no vitals filed for this visit.  BREAST:*** No palpable masses or nodules in either right or left breasts. No palpable axillary supraclavicular or infraclavicular adenopathy no breast tenderness or nipple discharge. (exam performed in the presence of a chaperone)  LABORATORY DATA:  I have reviewed the data as listed    Latest Ref Rng & Units 09/26/2022    8:35 AM 04/06/2011   11:20 AM  CMP  Glucose 70 - 99 mg/dL 98  89   BUN 6 - 20 mg/dL 14  11   Creatinine 0.86 - 1.00 mg/dL 5.78  0.7   Sodium 469 - 145 mmol/L 139  140   Potassium 3.5 - 5.1 mmol/L 4.1  4.4   Chloride 98 - 111 mmol/L 107  107   CO2 22 - 32 mmol/L 20  28   Calcium 8.9 - 10.3 mg/dL 8.8  9.3     Lab Results  Component Value Date   WBC 4.3 09/26/2022   HGB 11.4 (L) 09/26/2022   HCT 34.8 (L) 09/26/2022   MCV 93.0 09/26/2022   PLT 255 09/26/2022   NEUTROABS 2.0 04/06/2011    ASSESSMENT & PLAN:  No problem-specific Assessment & Plan notes found for this encounter.    No orders of the defined types were placed in this encounter.  The patient has a good understanding of the overall plan. she agrees with it. she will call with any problems that may develop before the next visit here. Total time spent: 30 mins including face to face time and time spent for planning, charting and co-ordination of care   Sherlyn Lick, CMA 10/01/22    I Janan Ridge am acting as a Neurosurgeon for [employer]  ***

## 2022-10-02 ENCOUNTER — Encounter: Payer: Self-pay | Admitting: *Deleted

## 2022-10-04 ENCOUNTER — Other Ambulatory Visit: Payer: Self-pay

## 2022-10-04 ENCOUNTER — Telehealth: Payer: Self-pay

## 2022-10-04 ENCOUNTER — Telehealth: Payer: Self-pay | Admitting: Pharmacy Technician

## 2022-10-04 ENCOUNTER — Other Ambulatory Visit (HOSPITAL_COMMUNITY): Payer: Self-pay

## 2022-10-04 ENCOUNTER — Inpatient Hospital Stay: Payer: 59 | Attending: Hematology and Oncology | Admitting: Hematology and Oncology

## 2022-10-04 ENCOUNTER — Encounter: Payer: Self-pay | Admitting: Hematology and Oncology

## 2022-10-04 VITALS — BP 131/69 | HR 71 | Temp 97.9°F | Resp 18 | Ht 67.0 in | Wt 132.8 lb

## 2022-10-04 DIAGNOSIS — Z170 Estrogen receptor positive status [ER+]: Secondary | ICD-10-CM | POA: Diagnosis not present

## 2022-10-04 DIAGNOSIS — I422 Other hypertrophic cardiomyopathy: Secondary | ICD-10-CM | POA: Insufficient documentation

## 2022-10-04 DIAGNOSIS — Z880 Allergy status to penicillin: Secondary | ICD-10-CM | POA: Insufficient documentation

## 2022-10-04 DIAGNOSIS — D0512 Intraductal carcinoma in situ of left breast: Secondary | ICD-10-CM | POA: Insufficient documentation

## 2022-10-04 DIAGNOSIS — R911 Solitary pulmonary nodule: Secondary | ICD-10-CM | POA: Diagnosis not present

## 2022-10-04 DIAGNOSIS — C50911 Malignant neoplasm of unspecified site of right female breast: Secondary | ICD-10-CM

## 2022-10-04 DIAGNOSIS — Z9013 Acquired absence of bilateral breasts and nipples: Secondary | ICD-10-CM | POA: Insufficient documentation

## 2022-10-04 DIAGNOSIS — Z79899 Other long term (current) drug therapy: Secondary | ICD-10-CM | POA: Diagnosis not present

## 2022-10-04 DIAGNOSIS — C50411 Malignant neoplasm of upper-outer quadrant of right female breast: Secondary | ICD-10-CM | POA: Diagnosis present

## 2022-10-04 DIAGNOSIS — Z881 Allergy status to other antibiotic agents status: Secondary | ICD-10-CM | POA: Diagnosis not present

## 2022-10-04 DIAGNOSIS — Z79811 Long term (current) use of aromatase inhibitors: Secondary | ICD-10-CM | POA: Insufficient documentation

## 2022-10-04 DIAGNOSIS — Z780 Asymptomatic menopausal state: Secondary | ICD-10-CM | POA: Diagnosis not present

## 2022-10-04 DIAGNOSIS — C50912 Malignant neoplasm of unspecified site of left female breast: Secondary | ICD-10-CM

## 2022-10-04 DIAGNOSIS — Z923 Personal history of irradiation: Secondary | ICD-10-CM | POA: Diagnosis not present

## 2022-10-04 MED ORDER — ANASTROZOLE 1 MG PO TABS: 1.0000 mg | ORAL_TABLET | Freq: Every day | ORAL | 3 refills | Status: DC

## 2022-10-04 MED ORDER — ABEMACICLIB 100 MG PO TABS: 100.0000 mg | ORAL_TABLET | Freq: Two times a day (BID) | ORAL | 0 refills | Status: DC

## 2022-10-04 MED ORDER — ABEMACICLIB 100 MG PO TABS
100.0000 mg | ORAL_TABLET | Freq: Two times a day (BID) | ORAL | 0 refills | Status: DC
  Filled 2022-10-04: qty 70, 35d supply, fill #0

## 2022-10-04 NOTE — Assessment & Plan Note (Addendum)
12/06/20: Bilateral Mastectomies:  Left Mastectomy:HG DCIS 2.2 cm with necrosis, Margins Neg ER 90%, PR 40% Right Mastectomy: Grade 2 IDC with DCIS 3.8 cm, 1/3 LN ER 100%, PR 100%, Her 2 Neg, Ki 67: 2-15%   Treatment Plan: 1. Mammaprint: Luminal type a low risk 2. Adjuvant XRT completed 03/14/2021 3. Adjuvant Anti-estrogen therapy with tamoxifen to start 03/22/2021-08/29/22 --------------------------------------------------------------------------------------------------------------------- Right breast palpable mass: Ultrasound: 6 mm, biopsy: Grade 2 IDC ER 95%, PR 95%, Ki67 30%, HER2 2+ by IHC FISH negative 09/25/2022: PET/CT: Benign   Treatment plan: Right lumpectomy: 09/26/2022: Recurrent IDC grade 2 measuring 0.8 cm, margins negative,ER 95%, PR 95%, Ki67 30%, HER2 2+ by IHC FISH negative Adjuvant ovarian function suppression with anastrozole.  Along with Verzinio  Surveillance: She will continue with Signatera for surveillance along with that we will obtain PET CT scans annually.  She is extremely anxious and worried about recurrence detection.  Abemaciclib counseling: I discussed at length the risks and benefits of Abemaciclib in combination with letrozole. Adverse effects of Abemaciclib include decreasing neutrophil count, pneumonia, blood clots in lungs as well as nausea and GI symptoms. Side effects of letrozole include hot flashes, muscle aches and pains, uterine bleeding/spotting/cancer, osteoporosis, risk of blood clots.  Complete estrogen blockade with ovarian function suppression: We will check estradiol levels on a monthly basis.  Patient is going to [place] and will be back October 11.  She plans to start Verzinio October 12.

## 2022-10-04 NOTE — Telephone Encounter (Signed)
Oral Oncology Pharmacist Encounter  Received new prescription for Verzenio (abemaciclib) for the treatment of ER positive, HER2 negative breast cancer in conjunction with anastrozole, planned duration until disease progression or unacceptable toxicity or for a total of 2 years.  Patient will be in clinic on 9/19 and she will get CBC and CMP done at that time. Will verify prescription dose and frequency at that time.   Current medication list in Epic reviewed, no significant/ relevant DDIs with Verzenio identified.   Evaluated chart and no patient barriers to medication adherence noted.   Patient agreement for treatment documented in MD note on 10/04/2022.  Prescription has been e-scribed to the [facility] for benefits analysis and approval.  Oral Oncology Clinic will continue to follow for insurance authorization, copayment issues, initial counseling and start date.  Bethel Born, PharmD Hematology/Oncology Clinical Pharmacist [facility] Oral Chemotherapy Navigation Clinic [phone] 10/04/2022 2:15 PM

## 2022-10-04 NOTE — Telephone Encounter (Signed)
Oral Oncology Patient Advocate Encounter   Received notification that prior authorization for Verzenio is required.   PA submitted on 10/04/22 [facility] Status is pending     .Jinger Neighbors, CPhT-Adv Oncology Pharmacy Patient Advocate [facility] Direct Number: [phone]  Fax: [phone]

## 2022-10-05 ENCOUNTER — Other Ambulatory Visit (HOSPITAL_COMMUNITY): Payer: Self-pay

## 2022-10-05 NOTE — Telephone Encounter (Signed)
Oral Oncology Patient Advocate Leana Gamer [facility] at [phone] and requested clinical questions be faxed to office onbase queue.  Jinger Neighbors, CPhT-Adv Oncology Pharmacy Patient Advocate [facility] Direct Number: [phone]  Fax: [phone]

## 2022-10-05 NOTE — Telephone Encounter (Signed)
Oral Oncology Patient Advocate Encounter  Prior Authorization for Kaitlin [name] has been approved.    PA# [id number] Effective dates: 10/05/22 through 10/05/23  Patient must fill at [facility]   Jinger Neighbors, CPhT-Adv Oncology Pharmacy Patient Advocate [facility] Direct Number: [phone]  Fax: [phone]

## 2022-10-05 NOTE — Telephone Encounter (Signed)
Oral Oncology Patient Advocate Encounter  Submitted chart notes to plan via e-fax [phone]  Jinger Neighbors, CPhT-Adv Oncology Pharmacy Patient Advocate [facility] Direct Number: [phone]  Fax: [phone]

## 2022-10-07 IMAGING — MG DIGITAL DIAGNOSTIC BILAT W/ TOMO W/ CAD
8 of 19 series · 8 of 40 positions shown · non-contrast
Comparison: Bilateral diagnostic mammogram performed at [facility] on 08/23/2014.

CLINICAL DATA: Mass felt on recent physical examination in the
upper outer right breast. The patient reports she has had burning
pain in that region since the physical examination.

EXAM:
DIGITAL DIAGNOSTIC BILATERAL MAMMOGRAM WITH TOMOSYNTHESIS AND CAD;
ULTRASOUND RIGHT BREAST LIMITED
TECHNIQUE: Bilateral digital diagnostic mammography and breast tomosynthesis
was performed. The images were evaluated with computer-aided
detection.; Targeted ultrasound examination of the right breast was
performed

[L ML]
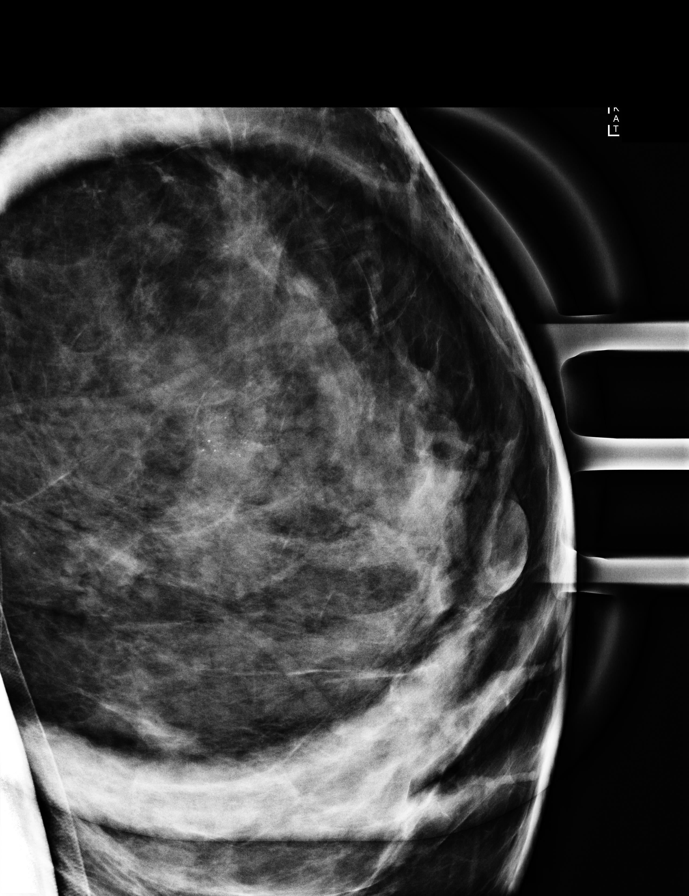

[L CC]
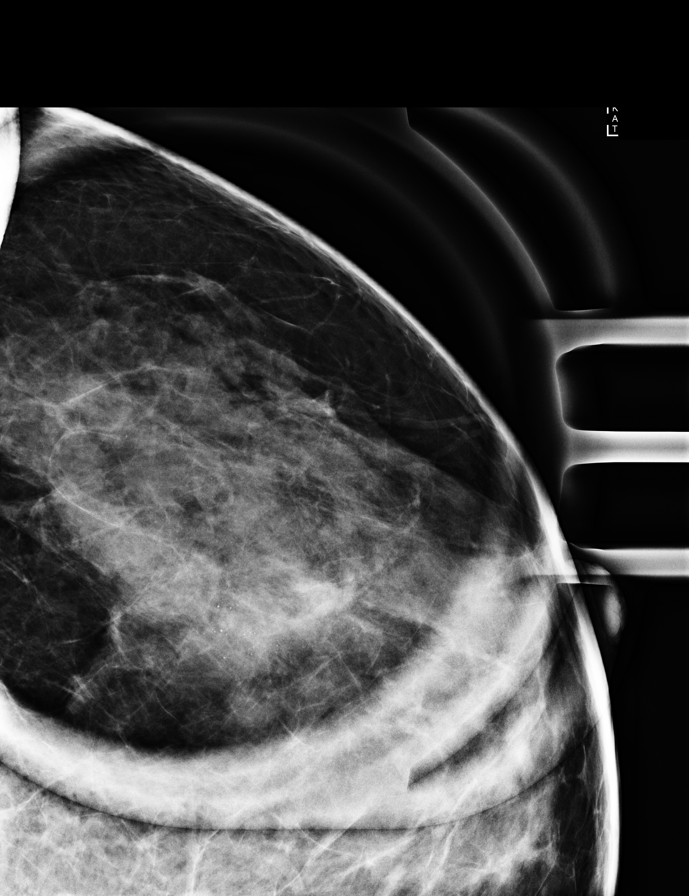

[R ML (1 of 2)]
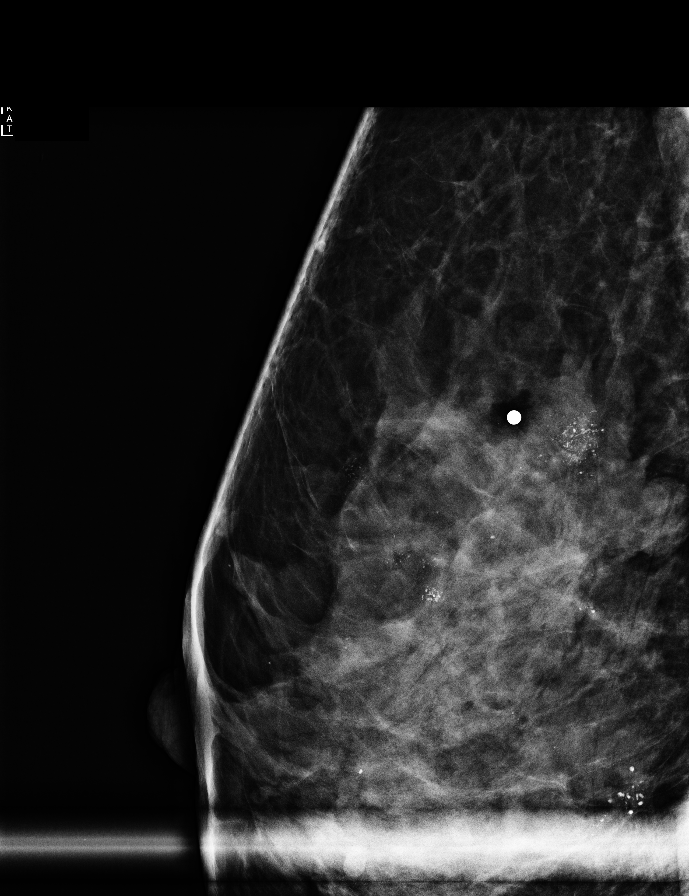

[R ML (2 of 2)]
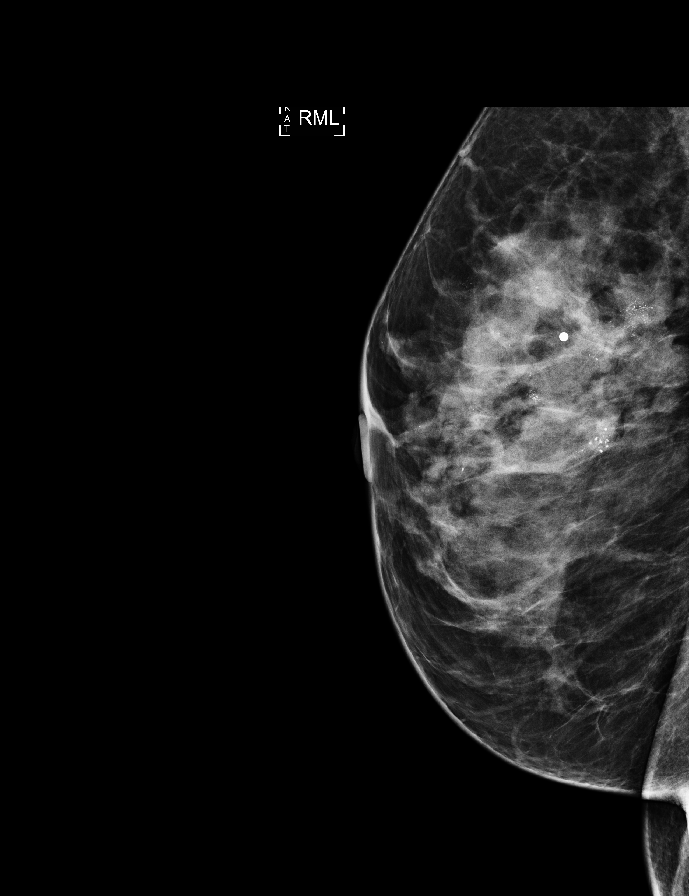

[R CC (1 of 2)]
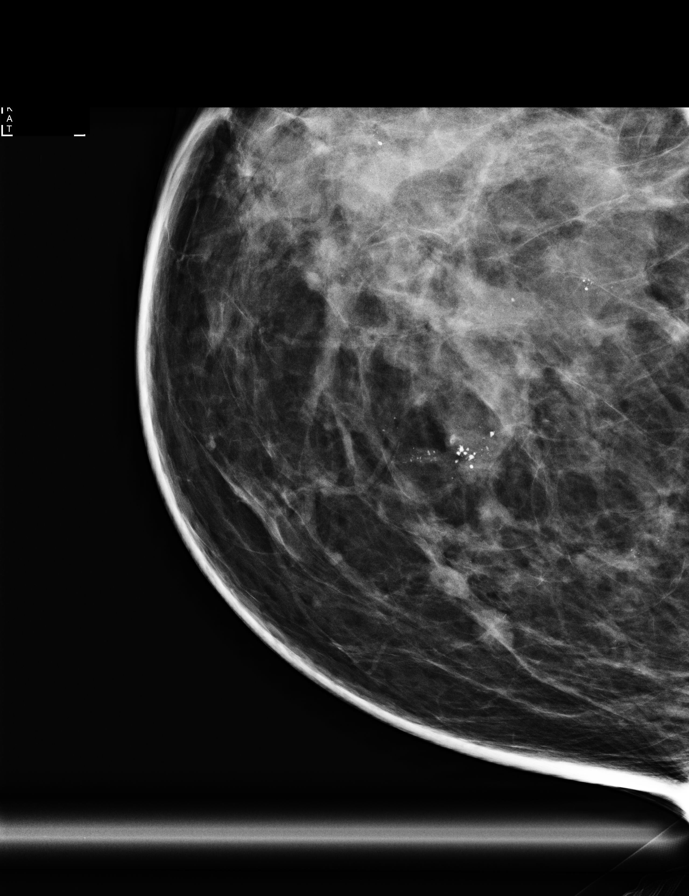

[R CC (2 of 2)]
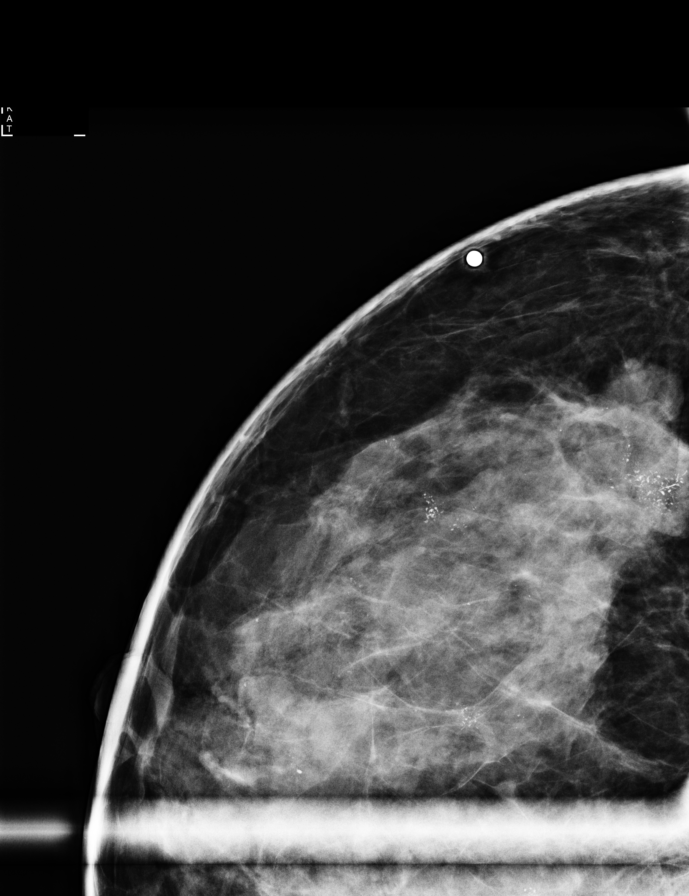

[R CC synth-2D]
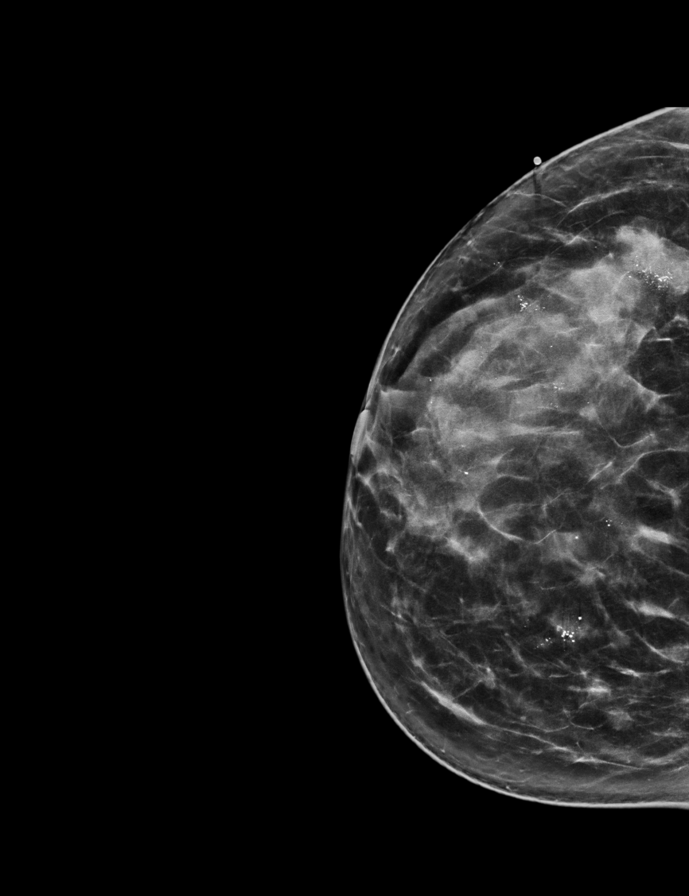

[R TAN synth-2D]
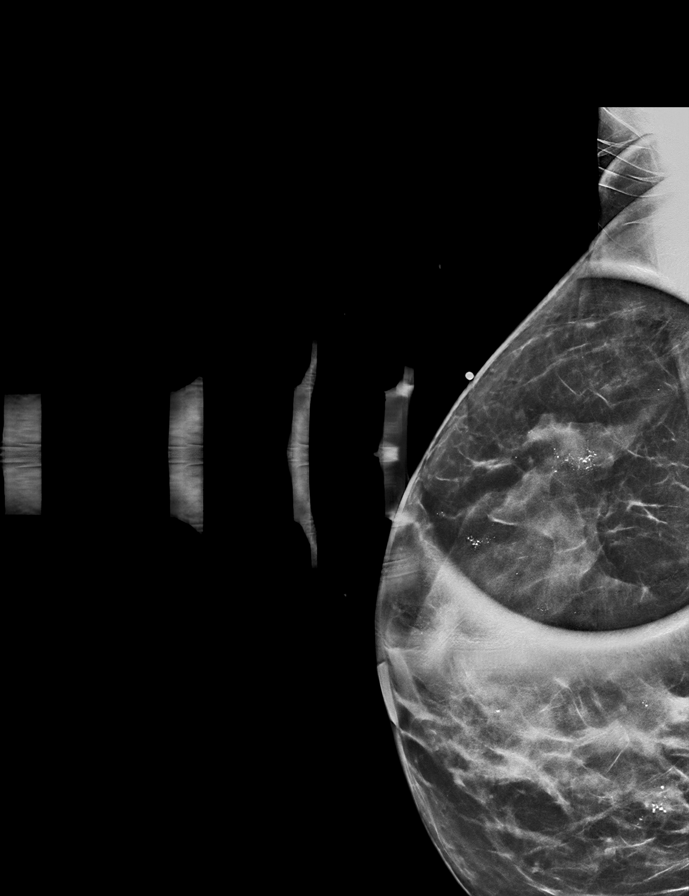

[8 of 40 positions shown; findings below may reference images not displayed]

ACR Breast Density Category c: The breast tissue is heterogeneously
dense, which may obscure small masses.
FINDINGS: Interval large number of pleomorphic calcifications in multiple
groups in the upper outer and upper inner quadrants of the right
breast as well as a group in the slightly inferior breast medially
in the lower inner quadrant. These span an area 9.8 x 6.3 x 5.9 cm.
There is also some ill-defined increased density the in the
upper-outer quadrant of the right breast at the location of the
recently palpated mass, marked with a metallic marker.

There is also an interval group of punctate rounded and oval
calcifications in the upper-outer quadrant of the left breast with
no visible dependent layering. This spans an area measuring 2.2 x
1.5 x 1.2 cm.

On physical exam, the patient has an approximately 4 x 3 cm palpable
mass in the 9:30 o'clock position of the right breast, 6 cm from the
nipple. There are no palpable right axillary lymph nodes.

Targeted ultrasound is performed, showing a 3.3 x 2.4 x 1.2 cm
irregular, heterogeneous, hypoechoic mass with some circumscribed
and some indistinct margins in the 9:30 o'clock position of the
right breast, 6 cm from the nipple. This contains a large number of
microcalcifications.

There is a 5 x 5 x 4 mm vertically oriented, similar appearing mass
in the 9:30 o'clock position of the right breast, 5 cm from the
nipple, 9 mm inferomedial to the larger mass.

Normal appearing right axillary lymph nodes.
IMPRESSION: 1. 3.3 cm palpable mass in the 9:30 o'clock position of the right
breast with imaging features highly suspicious for malignancy.
2. 5 mm probable satellite malignancy 9 mm inferomedial to the
larger mass in the 9:30 o'clock position of the right breast.
3. 9.8 x 6.3 x 5.9 cm area of multiple groups of calcifications
suspicious for additional malignancy in the upper outer, upper inner
and lower inner quadrants of the right breast. The group in the
slightly lower inner quadrant is the farthest from the
calcifications associated with the palpable mass. That group spans
1.5 cm and is located 8.5 cm from the palpable mass.
4. 2.5 cm group of indeterminate calcifications in upper-outer
quadrant of the left breast.

RECOMMENDATION:
1. Ultrasound-guided core needle biopsy of the 3.3 cm mass in the
9:30 o'clock position of the right breast. This has been discussed
with the patient and her husband and scheduled at [DATE] p.m. on
10/24/2020.
2. Stereotactic guided core needle biopsy of 1.5 cm group of
calcifications in the slightly lower inner quadrant of the right
breast and stereotactic guided core needle biopsy of the 1.5 cm
group of calcifications in the upper-outer quadrant of the left
breast. These have been scheduled at [DATE] p.m. on 10/27/2020.

I have discussed the findings and recommendations with the patient.
If applicable, a reminder letter will be sent to the patient
regarding the next appointment.

BI-RADS CATEGORY  5: Highly suggestive of malignancy.

## 2022-10-08 ENCOUNTER — Encounter: Payer: Self-pay | Admitting: *Deleted

## 2022-10-08 ENCOUNTER — Encounter: Payer: Self-pay | Admitting: Hematology and Oncology

## 2022-10-09 ENCOUNTER — Telehealth: Payer: Self-pay

## 2022-10-09 NOTE — Telephone Encounter (Signed)
S/w pt and offered phone visit with MD to further discuss these concerns. She accepted phone visit with MD 10/12/22 at 0945.

## 2022-10-10 ENCOUNTER — Encounter: Payer: Self-pay | Admitting: Hematology and Oncology

## 2022-10-11 ENCOUNTER — Other Ambulatory Visit: Payer: Self-pay

## 2022-10-11 ENCOUNTER — Inpatient Hospital Stay: Payer: 59

## 2022-10-11 VITALS — BP 115/71 | HR 76 | Temp 98.5°F | Resp 18

## 2022-10-11 DIAGNOSIS — C50411 Malignant neoplasm of upper-outer quadrant of right female breast: Secondary | ICD-10-CM | POA: Diagnosis not present

## 2022-10-11 MED ORDER — GOSERELIN ACETATE 3.6 MG ~~LOC~~ IMPL
3.6000 mg | DRUG_IMPLANT | Freq: Once | SUBCUTANEOUS | Status: AC
  Administered 2022-10-11: 3.6 mg via SUBCUTANEOUS
  Filled 2022-10-11: qty 3.6

## 2022-10-12 ENCOUNTER — Inpatient Hospital Stay (HOSPITAL_BASED_OUTPATIENT_CLINIC_OR_DEPARTMENT_OTHER): Payer: 59 | Admitting: Hematology and Oncology

## 2022-10-12 DIAGNOSIS — C50912 Malignant neoplasm of unspecified site of left female breast: Secondary | ICD-10-CM

## 2022-10-12 DIAGNOSIS — Z170 Estrogen receptor positive status [ER+]: Secondary | ICD-10-CM

## 2022-10-12 DIAGNOSIS — C50911 Malignant neoplasm of unspecified site of right female breast: Secondary | ICD-10-CM | POA: Diagnosis not present

## 2022-10-12 NOTE — Assessment & Plan Note (Signed)
12/06/20: Bilateral Mastectomies:  Left Mastectomy:HG DCIS 2.2 cm with necrosis, Margins Neg ER 90%, PR 40% Right Mastectomy: Grade 2 IDC with DCIS 3.8 cm, 1/3 LN ER 100%, PR 100%, Her 2 Neg, Ki 67: 2-15%   Treatment Plan: 1. Mammaprint: Luminal type a low risk 2. Adjuvant XRT completed 03/14/2021 3. Adjuvant Anti-estrogen therapy with tamoxifen to start 03/22/2021-08/29/22 --------------------------------------------------------------------------------------------------------------------- Right breast palpable mass: Ultrasound: 6 mm, biopsy: Grade 2 IDC ER 95%, PR 95%, Ki67 30%, HER2 2+ by IHC FISH negative 09/25/2022: PET/CT: Benign   Treatment plan: Right lumpectomy: 09/26/2022: Recurrent IDC grade 2 measuring 0.8 cm, margins negative,ER 95%, PR 95%, Ki67 30%, HER2 2+ by IHC FISH negative Adjuvant ovarian function suppression with anastrozole.  Along with Verzinio   Surveillance: She will continue with Signatera for surveillance along with that we will obtain PET CT scans annually.  She is extremely anxious and worried about recurrence detection.   Abemaciclib counseling: Patient is concerned about cost of the medication as well as the efficacy of the drug in her breast cancer.  I reassured her that we would be able to find assistance to enable her to receive the medication  Alternatively we may be starting clinical trial called wider that she could participate in down the line.

## 2022-10-12 NOTE — Progress Notes (Signed)
HEMATOLOGY-ONCOLOGY TELEPHONE VISIT PROGRESS NOTE  I connected with our patient on 10/12/22 at  9:45 AM EDT by telephone and verified that I am speaking with the correct person using two identifiers.  I discussed the limitations, risks, security and privacy concerns of performing an evaluation and management service by telephone and the availability of in person appointments.  I also discussed with the patient that there may be a patient responsible charge related to this service. The patient expressed understanding and agreed to proceed.   History of Present Illness:  Discussed the use of AI scribe software for clinical note transcription with the patient, who gave verbal consent to proceed.  History of Present Illness   The patient, with a history of breast cancer, presents with concerns about a newly prescribed medication, Verzenio. The patient was initially worried about the cost of the medication, but the doctor reassured her that it would be covered by her insurance. The patient also expressed concerns about the effectiveness of the medication in reducing the risk of cancer recurrence. The doctor explained the results of the [facility] E clinical trial, which showed a reduction in the risk of distant recurrence when Verzenio was used for two years.  The patient also discussed her recent Zoladex injection and the possibility of having an oophorectomy. She expressed a desire to see a gynecological oncologist for the procedure and asked the doctor for a referral. The patient also asked about the timing of starting the Verzenio and was reassured that there was no immediate rush to start the medication.        Oncology History  Bilateral breast cancer (HCC)  11/01/2020 Initial Diagnosis   Right breast biopsy UOQ 9:30 position: Grade 2 IDC with DCIS ER 100%, PR [place], Ki-67 2%, HER2 negative Right breast biopsy LIQ: Grade 1 IDC with DCIS Left breast biopsy UOQ: DCIS intermediate grade     11/01/2020 Cancer Staging   Staging form: Breast, AJCC 8th Edition - Clinical: Stage IIA (cT3, cN0, cM0, G2, ER+, PR+, HER2-) - Signed by Serena Croissant, MD on 11/01/2020 Stage prefix: Initial diagnosis Histologic grading system: 3 grade system   11/16/2020 Genetic Testing   Negative genetic testing on the CancerNext-Expanded+RNAinsight.  The report date is 11/16/2020  The CancerNext-Expanded gene panel offered by [facility] and includes sequencing and rearrangement analysis for the following 77 genes: AIP, ALK, APC*, ATM*, AXIN2, BAP1, BARD1, BLM, BMPR1A, BRCA1*, BRCA2*, BRIP1*, CDC73, CDH1*, CDK4, CDKN1B, CDKN2A, CHEK2*, CTNNA1, DICER1, FANCC, FH, FLCN, GALNT12, KIF1B, LZTR1, MAX, MEN1, MET, MLH1*, MSH2*, MSH3, MSH6*, MUTYH*, NBN, NF1*, NF2, NTHL1, PALB2*, PHOX2B, PMS2*, POT1, PRKAR1A, PTCH1, PTEN*, RAD51C*, RAD51D*, RB1, RECQL, RET, SDHA, SDHAF2, SDHB, SDHC, SDHD, SMAD4, SMARCA4, SMARCB1, SMARCE1, STK11, SUFU, TMEM127, TP53*, TSC1, TSC2, VHL and XRCC2 (sequencing and deletion/duplication); EGFR, EGLN1, HOXB13, KIT, MITF, PDGFRA, POLD1, and POLE (sequencing only); EPCAM and GREM1 (deletion/duplication only). DNA and RNA analyses performed for * genes.    12/06/2020 Surgery   Bilateral Mastectomies:  Left Mastectomy:HG DCIS 2.2 cm with necrosis, Margins Neg ER 90%, PR 40% Right Mastectomy: Grade 2 IDC with DCIS 3.8 cm, 1/3 LN ER 100%, PR 100%, Her 2 Neg, Ki 67: 2-15%   12/13/2020 Miscellaneous   MammaPrint: Luminal type A, low risk   01/27/2021 - 03/14/2021 Radiation Therapy   Site Technique Total Dose (Gy) Dose per Fx (Gy) Completed Fx Beam Energies  Chest Wall, Right: CW_R 3D 50.4/50.4 1.8 28/28 6XFFF  Chest Wall, Right: CW_R_SCLV 3D 50.4/50.4 1.8 28/28 6X, 10X  Chest  Wall, Right: CW_R_Bst Electron 10/10 2 5/5 6E     03/22/2021 -  Anti-estrogen oral therapy   Adjuvant tamoxifen   Ductal carcinoma in situ (DCIS) of left breast  01/03/2021 Initial Diagnosis   Ductal carcinoma in situ  (DCIS) of left breast   06/21/2021 Cancer Staging   Staging form: Breast, AJCC 8th Edition - Clinical: Stage 0 (cTis (DCIS), cN0, cM0, ER+, PR+) - Signed by Loa Socks, NP on 06/21/2021     REVIEW OF SYSTEMS:   Constitutional: Denies fevers, chills or abnormal weight loss All other systems were reviewed with the patient and are negative. Observations/Objective:     Assessment Plan:  Bilateral breast cancer (HCC) 12/06/20: Bilateral Mastectomies:  Left Mastectomy:HG DCIS 2.2 cm with necrosis, Margins Neg ER 90%, PR 40% Right Mastectomy: Grade 2 IDC with DCIS 3.8 cm, 1/3 LN ER 100%, PR 100%, Her 2 Neg, Ki 67: 2-15%   Treatment Plan: 1. Mammaprint: Luminal type a low risk 2. Adjuvant XRT completed 03/14/2021 3. Adjuvant Anti-estrogen therapy with tamoxifen to start 03/22/2021-08/29/22 --------------------------------------------------------------------------------------------------------------------- Right breast palpable mass: Ultrasound: 6 mm, biopsy: Grade 2 IDC ER 95%, PR 95%, Ki67 30%, HER2 2+ by IHC FISH negative 09/25/2022: PET/CT: Benign   Treatment plan: Right lumpectomy: 09/26/2022: Recurrent IDC grade 2 measuring 0.8 cm, margins negative,ER 95%, PR 95%, Ki67 30%, HER2 2+ by IHC FISH negative Adjuvant ovarian function suppression with anastrozole.  Along with Verzinio   Surveillance: She will continue with [facility] for surveillance along with that we will obtain PET CT scans annually.  She is extremely anxious and worried about recurrence detection.   Abemaciclib counseling: Patient is concerned about cost of the medication as well as the efficacy of the drug in her breast cancer.  I reassured her that we would be able to find assistance to enable her to receive the medication  Alternatively we may be starting clinical trial called wider that she could participate in down the line. --------------------------------- Assessment and Plan    Breast Cancer High risk  patient with recent recurrence. Discussed the benefits of adding Verzenio (abemaciclib) to her treatment regimen based on the [facility]-E trial. The trial showed a 6.4% absolute reduction in risk of distant recurrence with the addition of Verzenio for two years. -Start Verzenio at 100mg  twice daily. -Check estradiol levels to assess effectiveness of ovarian function suppression.  Ovarian Function Suppression Initiated Zoladex (goserelin) and Anastrozole. -Start Anastrozole now. -Consider oophorectomy for permanent ovarian function suppression. Referral to Dr. Madaline Guthrie for consultation.  Follow-up Scheduled for October 17th to meet with pharmacist and start Verzenio. Plan to check labs including estradiol level.          I discussed the assessment and treatment plan with the patient. The patient was provided an opportunity to ask questions and all were answered. The patient agreed with the plan and demonstrated an understanding of the instructions. The patient was advised to call back or seek an in-person evaluation if the symptoms worsen or if the condition fails to improve as anticipated.   I provided 12 minutes of non-face-to-face time during this encounter.  This includes time for charting and coordination of care   Tamsen Meek, MD

## 2022-10-16 ENCOUNTER — Encounter: Payer: Self-pay | Admitting: Hematology and Oncology

## 2022-10-16 ENCOUNTER — Ambulatory Visit: Payer: 59 | Admitting: Radiology

## 2022-10-22 ENCOUNTER — Telehealth: Payer: Self-pay | Admitting: Pharmacy Technician

## 2022-10-22 MED ORDER — ABEMACICLIB 100 MG PO TABS
100.0000 mg | ORAL_TABLET | Freq: Two times a day (BID) | ORAL | 0 refills | Status: DC
Start: 2022-10-22 — End: 2022-11-20

## 2022-10-22 NOTE — Telephone Encounter (Signed)
Oral Oncology Patient Advocate Encounter   Was successful in obtaining a copay card for Verzenio.  This copay card will make the patients copay $0.  The billing information is as follows and has been shared with [facility].   RxBin: [id number] PCN: [id number] Member ID: [id number] Group ID: [id number]   Jinger Neighbors, CPhT-Adv Oncology Pharmacy Patient Advocate [facility] Direct Number: [phone]  Fax: [phone]

## 2022-10-24 ENCOUNTER — Ambulatory Visit (HOSPITAL_BASED_OUTPATIENT_CLINIC_OR_DEPARTMENT_OTHER)
Admission: RE | Admit: 2022-10-24 | Discharge: 2022-10-24 | Disposition: A | Discharge status: Home / Self Care | Payer: 59 | Source: Ambulatory Visit | Attending: Hematology and Oncology | Admitting: Hematology and Oncology

## 2022-10-24 DIAGNOSIS — Z780 Asymptomatic menopausal state: Secondary | ICD-10-CM | POA: Insufficient documentation

## 2022-10-26 ENCOUNTER — Encounter: Payer: Self-pay | Admitting: Hematology and Oncology

## 2022-11-08 ENCOUNTER — Inpatient Hospital Stay: Payer: 59 | Attending: Hematology and Oncology

## 2022-11-08 ENCOUNTER — Inpatient Hospital Stay: Payer: 59 | Admitting: Pharmacist

## 2022-11-08 ENCOUNTER — Inpatient Hospital Stay: Payer: 59

## 2022-11-08 VITALS — BP 116/45 | HR 69 | Temp 97.7°F | Resp 18 | Ht 67.0 in | Wt 136.6 lb

## 2022-11-08 DIAGNOSIS — Z8049 Family history of malignant neoplasm of other genital organs: Secondary | ICD-10-CM | POA: Diagnosis not present

## 2022-11-08 DIAGNOSIS — Z87891 Personal history of nicotine dependence: Secondary | ICD-10-CM | POA: Insufficient documentation

## 2022-11-08 DIAGNOSIS — Z8041 Family history of malignant neoplasm of ovary: Secondary | ICD-10-CM | POA: Diagnosis not present

## 2022-11-08 DIAGNOSIS — Z79899 Other long term (current) drug therapy: Secondary | ICD-10-CM | POA: Insufficient documentation

## 2022-11-08 DIAGNOSIS — D649 Anemia, unspecified: Secondary | ICD-10-CM | POA: Diagnosis not present

## 2022-11-08 DIAGNOSIS — Z800 Family history of malignant neoplasm of digestive organs: Secondary | ICD-10-CM | POA: Insufficient documentation

## 2022-11-08 DIAGNOSIS — C50411 Malignant neoplasm of upper-outer quadrant of right female breast: Secondary | ICD-10-CM | POA: Diagnosis present

## 2022-11-08 DIAGNOSIS — Z803 Family history of malignant neoplasm of breast: Secondary | ICD-10-CM | POA: Diagnosis not present

## 2022-11-08 DIAGNOSIS — Z79811 Long term (current) use of aromatase inhibitors: Secondary | ICD-10-CM | POA: Diagnosis not present

## 2022-11-08 DIAGNOSIS — Z823 Family history of stroke: Secondary | ICD-10-CM | POA: Insufficient documentation

## 2022-11-08 DIAGNOSIS — Z1732 Human epidermal growth factor receptor 2 negative status: Secondary | ICD-10-CM | POA: Insufficient documentation

## 2022-11-08 DIAGNOSIS — Z806 Family history of leukemia: Secondary | ICD-10-CM | POA: Insufficient documentation

## 2022-11-08 DIAGNOSIS — C50911 Malignant neoplasm of unspecified site of right female breast: Secondary | ICD-10-CM

## 2022-11-08 DIAGNOSIS — Z9013 Acquired absence of bilateral breasts and nipples: Secondary | ICD-10-CM | POA: Insufficient documentation

## 2022-11-08 DIAGNOSIS — Z8052 Family history of malignant neoplasm of bladder: Secondary | ICD-10-CM | POA: Diagnosis not present

## 2022-11-08 DIAGNOSIS — Z8379 Family history of other diseases of the digestive system: Secondary | ICD-10-CM | POA: Insufficient documentation

## 2022-11-08 DIAGNOSIS — Z1721 Progesterone receptor positive status: Secondary | ICD-10-CM | POA: Insufficient documentation

## 2022-11-08 DIAGNOSIS — Z808 Family history of malignant neoplasm of other organs or systems: Secondary | ICD-10-CM | POA: Insufficient documentation

## 2022-11-08 DIAGNOSIS — Z170 Estrogen receptor positive status [ER+]: Secondary | ICD-10-CM

## 2022-11-08 DIAGNOSIS — Z83719 Family history of colon polyps, unspecified: Secondary | ICD-10-CM | POA: Diagnosis not present

## 2022-11-08 LAB — CBC WITH DIFFERENTIAL (CANCER CENTER ONLY)
Abs Immature Granulocytes: 0.01 10*3/uL (ref 0.00–0.07)
Basophils Absolute: 0.1 10*3/uL (ref 0.0–0.1)
Basophils Relative: 2 %
Eosinophils Absolute: 0.1 10*3/uL (ref 0.0–0.5)
Eosinophils Relative: 3 %
HCT: 34.7 % — ABNORMAL LOW (ref 36.0–46.0)
Hemoglobin: 11.4 g/dL — ABNORMAL LOW (ref 12.0–15.0)
Immature Granulocytes: 0 %
Lymphocytes Relative: 24 %
Lymphs Abs: 1.1 10*3/uL (ref 0.7–4.0)
MCH: 30.6 pg (ref 26.0–34.0)
MCHC: 32.9 g/dL (ref 30.0–36.0)
MCV: 93.3 fL (ref 80.0–100.0)
Monocytes Absolute: 0.5 10*3/uL (ref 0.1–1.0)
Monocytes Relative: 11 %
Neutro Abs: 3 10*3/uL (ref 1.7–7.7)
Neutrophils Relative %: 60 %
Platelet Count: 275 10*3/uL (ref 150–400)
RBC: 3.72 MIL/uL — ABNORMAL LOW (ref 3.87–5.11)
RDW: 13.5 % (ref 11.5–15.5)
WBC Count: 4.8 10*3/uL (ref 4.0–10.5)
nRBC: 0 % (ref 0.0–0.2)

## 2022-11-08 LAB — CMP (CANCER CENTER ONLY)
ALT: 17 U/L (ref 0–44)
AST: 18 U/L (ref 15–41)
Albumin: 4.3 g/dL (ref 3.5–5.0)
Alkaline Phosphatase: 47 U/L (ref 38–126)
Anion gap: 5 (ref 5–15)
BUN: 25 mg/dL — ABNORMAL HIGH (ref 6–20)
CO2: 28 mmol/L (ref 22–32)
Calcium: 9.2 mg/dL (ref 8.9–10.3)
Chloride: 105 mmol/L (ref 98–111)
Creatinine: 0.7 mg/dL (ref 0.44–1.00)
GFR, Estimated: >60 mL/min (ref >60)
Glucose, Bld: 107 mg/dL — ABNORMAL HIGH (ref 70–99)
Potassium: 4.1 mmol/L (ref 3.5–5.1)
Sodium: 138 mmol/L (ref 135–145)
Total Bilirubin: 0.3 mg/dL (ref 0.3–1.2)
Total Protein: 7 g/dL (ref 6.5–8.1)

## 2022-11-08 MED ORDER — GOSERELIN ACETATE 3.6 MG ~~LOC~~ IMPL
3.6000 mg | DRUG_IMPLANT | Freq: Once | SUBCUTANEOUS | Status: AC
  Administered 2022-11-08: 3.6 mg via SUBCUTANEOUS
  Filled 2022-11-08: qty 3.6

## 2022-11-08 NOTE — Progress Notes (Signed)
[facility]       Telephone: [phone]?Fax: [phone]   Oncology Clinical Pharmacist Practitioner Initial Assessment  Kaitlin [name] is a 47 y.o. female with a diagnosis of breast cancer. They were contacted today via in-person visit.  Indication/Regimen Abemaciclib (Verzenio) is being used appropriately for treatment of breast cancer by Dr. Serena Croissant.      Wt Readings from Last 1 Encounters:  11/08/22 136 lb 9.6 oz (62 kg)    Estimated body surface area is 1.71 meters squared as calculated from the following:   Height as of this encounter: 5\' 7"  (1.702 m).   Weight as of this encounter: 136 lb 9.6 oz (62 kg).  The dosing regimen is 100 mg by mouth every 12 hours on days 1 to 28 of a 28-day cycle. This is being given  in combination with anastrozole started 10/12/22 and goserelin started 10/11/22 . It is planned to continue until two years in the adjuvant setting per the [facility] trial data. Kaitlin [name] was seen today to establish care after being referred by Dr. Pamelia Hoit for her abemaciclib management. She plans on starting abemaciclib tomorrow. 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 We discussed that since not FDA approved, it is hard to know for certain what is in one bottle verus the next. She is familiar with the [employer] herbal website and states she has  a friend that is an oncology nurse there.  Her estradiol lab that Dr. Pamelia Hoit ordered is not back yet but will likely come back tomorrow. She is starting her second goserelin injection today. She stated she had some spotting recently and we told her to closely monitor this going into her second injection. We described that her menses should stop while on this agent. She had a recent DEXA scan that showed osteopenia and she takes vitamin D currently. We explained that a repeat test is usually done every 2 years. She states that her and her husband eat  a raw diet which is somewhat like a vegetarian diet per her report. No meat. We did discuss the importance of watching fiber intake, especially if she starts to have loose stools with abemaciclib. She said loperamide has caused constipation in the past so she will likely try to alter her diet first should diarrhea occur. She was not interested in being sent anti-nausea meds at this time.    Dose Modifications Dr. Pamelia Hoit is prescribing a reduced dose of abemaciclib of 100 mg every 12 hour dosing  Access Assessment Kaitlin [name] will be receiving abemaciclib through [facility] Insurance Concerns: none Start date if known: 11/09/22  Allergies Allergies  Allergen Reactions   Ceclor [Cefaclor] Hives and Shortness Of Breath   Cephalosporins Anaphylaxis and Hives   Rizatriptan Anaphylaxis   Amoxicillin Hives and Rash   Penicillins Other (See Comments)    Unknown reaction     Vitals    11/08/2022    2:30 PM 10/11/2022    1:00 PM 10/04/2022   12:48 PM  Oncology Vitals  Height 170 cm  170 cm  Weight 61.961 kg  60.238 kg  Weight (lbs) 136 lbs 10 oz  132 lbs 13 oz  BMI 21.39 kg/m2  20.8 kg/m2  Temp 97.7 F (36.5 C) 98.5 F (36.9 C) 97.9 F (36.6 C)  Pulse Rate 69 76 71  BP 116/45 115/71 131/69  Resp 18 18 18   SpO2 100 %  100 % 100 %  BSA (m2) 1.71 m2  1.69 m2     Laboratory Data    Latest Ref Rng & Units 11/08/2022    2:03 PM 09/26/2022    8:35 AM 04/11/2022    1:24 PM  CBC EXTENDED  WBC 4.0 - 10.5 K/uL 4.8  4.3  5.6   RBC 3.87 - 5.11 MIL/uL 3.72  3.74  3.81   Hemoglobin 12.0 - 15.0 g/dL 29.5  28.4  13.2   HCT 36.0 - 46.0 % 34.7  34.8  35.9   Platelets 150 - 400 K/uL 275  255  264   NEUT# 1.7 - 7.7 K/uL 3.0     Lymph# 0.7 - 4.0 K/uL 1.1          Latest Ref Rng & Units 11/08/2022    2:03 PM 09/26/2022    8:35 AM 04/06/2011   11:20 AM  CMP  Glucose 70 - 99 mg/dL 440  98  89   BUN 6 - 20 mg/dL 25  14  11    Creatinine 0.44 - 1.00 mg/dL 1.02  7.25  0.7    Sodium 135 - 145 mmol/L 138  139  140   Potassium 3.5 - 5.1 mmol/L 4.1  4.1  4.4   Chloride 98 - 111 mmol/L 105  107  107   CO2 22 - 32 mmol/L 28  20  28    Calcium 8.9 - 10.3 mg/dL 9.2  8.8  9.3   Total Protein 6.5 - 8.1 g/dL 7.0     Total Bilirubin 0.3 - 1.2 mg/dL 0.3     Alkaline Phos 38 - 126 U/L 47     AST 15 - 41 U/L 18     ALT 0 - 44 U/L 17      Contraindications Contraindications were reviewed? Yes Contraindications to therapy were identified? No   Safety Precautions The following safety precautions for the use of abemaciclib were reviewed:  Changes in kidney function: importance of drinking plenty of fluids and monitoring urine output Diarrhea: we reviewed that diarrhea is common with abemaciclib and confirmed that she does have loperamide (Imodium) at home.  We reviewed how to take this medication PRN and gave her information on abemaciclib Decreased white blood cells (WBCs) and increased risk for infection: we discussed the importance of having a thermometer and what the Centers for Disease Control and Prevention (CDC) considers a fever which is 100.62F (38C) or higher.  Gave patient 24/7 triage line to call if any fevers or symptoms Decreased hemoglobin, part of red blood cells that carry iron and oxygen Fatigue Nausea and Vomiting Hepatotoxicity: reviewed to contact clinic for RUQ pain that will not subside, yellowing of eyes/skin Decreased appetite or weight loss Abdominal pain Decreased platelet count and increased risk for bleeding Venous thromboembolism (VTE): reviewed signs of deep vein thrombosis (DVT) such as leg swelling, redness, pain, or tenderness and signs of pulmonary embolism (PE) such as shortness of breath, rapid or irregular heartbeat, cough, chest pain, or lightheadedness ILD/Pneumonitis: we reviewed potential symptoms including cough, shortness, and fatigue. Handling body fluids and waste Pregnancy, sexual activity, and contraception Reviewed to take  the medication every 12 hours (with food sometimes can be easier on the stomach) and to take it at the same time every day. Discussed proper storage and handling of abemaciclib  Medication Reconciliation Current Outpatient Medications  Medication Sig Dispense Refill   abemaciclib (VERZENIO) 100 MG tablet Take 1 tablet (100 mg total) by mouth 2 (  two) times daily. 60 tablet 0   anastrozole (ARIMIDEX) 1 MG tablet Take 1 tablet (1 mg total) by mouth daily. 90 tablet 3   GLUTATHIONE PO Take 250 mg by mouth daily. Usually takes twice a week (Monday, Thursday)     ivermectin (STROMECTOL) 3 MG TABS tablet Take 12 mg by mouth once. Twice a week (Monday, Thursday)     Magnesium Glycinate 120 MG CAPS Take 240 mg by mouth at bedtime.     Misc Natural Products (CORTISOL MANAGER) TABS Take 1 tablet by mouth daily.     MISC NATURAL PRODUCTS PO Take by mouth daily. Super Digestive Enzymes by [employer]     Multiple Vitamin (MULTIVITAMIN WITH MINERALS) TABS tablet Take 1 tablet by mouth daily.     Omega-3 Fatty Acids (FISH OIL PO) Take 2 capsules by mouth daily. 1200 mg Omega 3     Vitamin D-Vitamin K (VITAMIN K2-VITAMIN D3 PO) Take by mouth daily. Vitamin D: 1.25 mcg / Vitamin K: 90 mcg     ALPRAZolam (XANAX) 0.25 MG tablet Take 1 tablet (0.25 mg total) by mouth 2 (two) times daily as needed for anxiety. (Patient not taking: Reported on 11/08/2022) 30 tablet 0   No current facility-administered medications for this visit.   Facility-Administered Medications Ordered in Other Visits  Medication Dose Route Frequency Provider Last Rate Last Admin   goserelin (ZOLADEX) injection 3.6 mg  3.6 mg Subcutaneous Once Serena Croissant, MD        Medication reconciliation is based on the patient's most recent medication list in the electronic medical record (EMR) including herbal products and OTC medications.   The patient's medication list was reviewed today with the patient? Yes   Drug-drug interactions  (DDIs) DDIs were evaluated? Yes Significant DDIs identified?  No, did discuss that difficult to assess herbal supplements and possible interactions with patient. She verbalized understanding.  Drug-Food Interactions Drug-food interactions were evaluated? Yes Drug-food interactions identified?  Avoid grapefruit products  Follow-up Plan  Start abemaciclib 100 mg by mouth every 12 hours tomorrow Continue anastrozole 1 mg by mouth daily Continue goserelin 3.6 mg subcutaneously every 28 days. Dose today, then 12/19/22 Monitor for abemaciclib side effects Will add lab, Dr. Pamelia Hoit visit on 11/22/22. Patient prefers early appointment since has young children and Halloween Will add lab, pharmacy clinic visit, and goserelin on 12/06/22  Jens Som participated in the discussion, expressed understanding, and voiced agreement with the above plan. All questions were answered to her satisfaction. The patient was advised to contact the clinic at [phone] with any questions or concerns prior to her return visit.   I spent 90 minutes assessing the patient.  Tracyann Duffell A. Odetta Pink, PharmD, BCOP, CPP  Anselm Lis, RPH-CPP, 11/08/2022 3:39 PM  **Disclaimer: This note was dictated with voice recognition software. Similar sounding words can inadvertently be transcribed and this note may contain transcription errors which may not have been corrected upon publication of note.**

## 2022-11-14 ENCOUNTER — Encounter: Payer: Self-pay | Admitting: Hematology and Oncology

## 2022-11-14 LAB — ESTRADIOL, ULTRA SENS: Estradiol, Sensitive: <2.5 pg/mL

## 2022-11-19 ENCOUNTER — Telehealth: Payer: Self-pay | Admitting: Radiation Oncology

## 2022-11-19 NOTE — Telephone Encounter (Signed)
MRR received via fax. Notes sent and request given to dosimetry team 11/19/22 to complete request.

## 2022-11-20 ENCOUNTER — Other Ambulatory Visit: Payer: Self-pay | Admitting: Hematology and Oncology

## 2022-11-20 DIAGNOSIS — C50911 Malignant neoplasm of unspecified site of right female breast: Secondary | ICD-10-CM

## 2022-11-20 NOTE — Telephone Encounter (Signed)
[employer] requesting 14 day supply

## 2022-11-22 ENCOUNTER — Inpatient Hospital Stay: Payer: 59

## 2022-11-22 ENCOUNTER — Other Ambulatory Visit: Payer: Self-pay

## 2022-11-22 ENCOUNTER — Inpatient Hospital Stay: Payer: 59 | Admitting: Adult Health

## 2022-11-22 VITALS — BP 116/63 | HR 73 | Temp 97.8°F | Resp 14 | Ht 67.0 in | Wt 134.0 lb

## 2022-11-22 DIAGNOSIS — C50912 Malignant neoplasm of unspecified site of left female breast: Secondary | ICD-10-CM | POA: Diagnosis not present

## 2022-11-22 DIAGNOSIS — C50411 Malignant neoplasm of upper-outer quadrant of right female breast: Secondary | ICD-10-CM | POA: Diagnosis not present

## 2022-11-22 DIAGNOSIS — C50911 Malignant neoplasm of unspecified site of right female breast: Secondary | ICD-10-CM

## 2022-11-22 DIAGNOSIS — Z170 Estrogen receptor positive status [ER+]: Secondary | ICD-10-CM

## 2022-11-22 LAB — CBC WITH DIFFERENTIAL (CANCER CENTER ONLY)
Abs Immature Granulocytes: 0.01 10*3/uL (ref 0.00–0.07)
Basophils Absolute: 0.1 10*3/uL (ref 0.0–0.1)
Basophils Relative: 2 %
Eosinophils Absolute: 0.1 10*3/uL (ref 0.0–0.5)
Eosinophils Relative: 4 %
HCT: 35.5 % — ABNORMAL LOW (ref 36.0–46.0)
Hemoglobin: 11.5 g/dL — ABNORMAL LOW (ref 12.0–15.0)
Immature Granulocytes: 0 %
Lymphocytes Relative: 28 %
Lymphs Abs: 0.7 10*3/uL (ref 0.7–4.0)
MCH: 30.1 pg (ref 26.0–34.0)
MCHC: 32.4 g/dL (ref 30.0–36.0)
MCV: 92.9 fL (ref 80.0–100.0)
Monocytes Absolute: 0.2 10*3/uL (ref 0.1–1.0)
Monocytes Relative: 7 %
Neutro Abs: 1.6 10*3/uL — ABNORMAL LOW (ref 1.7–7.7)
Neutrophils Relative %: 59 %
Platelet Count: 246 10*3/uL (ref 150–400)
RBC: 3.82 MIL/uL — ABNORMAL LOW (ref 3.87–5.11)
RDW: 13.5 % (ref 11.5–15.5)
WBC Count: 2.7 10*3/uL — ABNORMAL LOW (ref 4.0–10.5)
nRBC: 0 % (ref 0.0–0.2)

## 2022-11-22 LAB — CMP (CANCER CENTER ONLY)
ALT: 15 U/L (ref 0–44)
AST: 20 U/L (ref 15–41)
Albumin: 4.1 g/dL (ref 3.5–5.0)
Alkaline Phosphatase: 47 U/L (ref 38–126)
Anion gap: 5 (ref 5–15)
BUN: 16 mg/dL (ref 6–20)
CO2: 29 mmol/L (ref 22–32)
Calcium: 9.2 mg/dL (ref 8.9–10.3)
Chloride: 106 mmol/L (ref 98–111)
Creatinine: 0.95 mg/dL (ref 0.44–1.00)
GFR, Estimated: >60 mL/min (ref >60)
Glucose, Bld: 67 mg/dL — ABNORMAL LOW (ref 70–99)
Potassium: 3.8 mmol/L (ref 3.5–5.1)
Sodium: 140 mmol/L (ref 135–145)
Total Bilirubin: 0.3 mg/dL (ref 0.3–1.2)
Total Protein: 6.6 g/dL (ref 6.5–8.1)

## 2022-11-22 NOTE — Progress Notes (Signed)
[facility] Cancer Follow up:    Kaitlin Brunette, MD [phone] 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[name], estrogen receptor positive (HCC) Staging form: Breast, AJCC 8th Edition - Pathologic: Stage IB (pT2, pN1a(sn), cM0, G2, ER+, PR+, HER2-) - Unsigned Stage prefix: Initial diagnosis Method of lymph node assessment: Sentinel lymph node biopsy Multigene prognostic tests performed: MammaPrint Histologic grading system: 3 grade system   SUMMARY OF ONCOLOGIC HISTORY: Oncology History  Bilateral breast cancer (HCC)  11/01/2020 Initial Diagnosis   Right breast biopsy UOQ 9:30 position: Grade 2 IDC with DCIS ER 100%, PR [place], Ki-67 2%, HER2 negative Right breast biopsy LIQ: Grade 1 IDC with DCIS Left breast biopsy UOQ: DCIS intermediate grade    11/01/2020 Cancer Staging   Staging form: Breast, AJCC 8th Edition - Clinical: Stage IIA (cT3, cN0, cM0, G2, ER+, PR+, HER2-) - Signed by Serena Croissant, MD on 11/01/2020 Stage prefix: Initial diagnosis Histologic grading system: 3 grade system   11/16/2020 Genetic Testing   Negative genetic testing on the CancerNext-Expanded+RNAinsight.  The report date is 11/16/2020  The CancerNext-Expanded gene panel offered by [facility] and includes sequencing and rearrangement analysis for the following 77 genes: AIP, ALK, APC*, ATM*, AXIN2, BAP1, BARD1, BLM, BMPR1A, BRCA1*, BRCA2*, BRIP1*, CDC73,  CDH1*, CDK4, CDKN1B, CDKN2A, CHEK2*, CTNNA1, DICER1, FANCC, FH, FLCN, GALNT12, KIF1B, LZTR1, MAX, MEN1, MET, MLH1*, MSH2*, MSH3, MSH6*, MUTYH*, NBN, NF1*, NF2, NTHL1, PALB2*, PHOX2B, PMS2*, POT1, PRKAR1A, PTCH1, PTEN*, RAD51C*, RAD51D*, RB1, RECQL, RET, SDHA, SDHAF2, SDHB, SDHC, SDHD, SMAD4, SMARCA4, SMARCB1, SMARCE1, STK11, SUFU, TMEM127, TP53*, TSC1, TSC2, VHL and XRCC2 (sequencing and deletion/duplication); EGFR, EGLN1, HOXB13, KIT, MITF, PDGFRA, POLD1, and POLE (sequencing only); EPCAM and GREM1 (deletion/duplication only). DNA and RNA analyses performed for * genes.    12/06/2020 Surgery   Bilateral Mastectomies:  Left Mastectomy:HG DCIS 2.2 cm with necrosis, Margins Neg ER 90%, PR 40% Right Mastectomy: Grade 2 IDC with DCIS 3.8 cm, 1/3 LN ER 100%, PR 100%, Her 2 Neg, Ki 67: 2-15%   12/13/2020 Miscellaneous   MammaPrint: Luminal type A, low risk   01/27/2021 - 03/14/2021 Radiation Therapy   Site Technique Total Dose (Gy) Dose per Fx (Gy) Completed Fx Beam Energies  Chest Wall, Right: CW_R 3D 50.4/50.4 1.8 28/28 6XFFF  Chest Wall, Right: CW_R_SCLV 3D 50.4/50.4 1.8 28/28 6X, 10X  Chest Wall, Right: CW_R_Bst Electron 10/10 2 5/5 6E     03/22/2021 -  Anti-estrogen oral therapy   Adjuvant tamoxifen   Ductal carcinoma in situ (DCIS) of left breast  01/03/2021 Initial Diagnosis   Ductal carcinoma in situ (DCIS) of left breast   06/21/2021 Cancer Staging   Staging form: Breast, AJCC 8th Edition - Clinical: Stage 0 (cTis (DCIS), cN0, cM0, ER+, PR+) - Signed by Loa Socks, NP on 06/21/2021     CURRENT THERAPY:  INTERVAL HISTORY: Kaitlin [name] 47 y.o. [name] returns for current breast cancer.  She is taking anastrozole, receiving Zoladex every 4  weeks, and is on Verzenio 100 mg twice daily.  She reports no significant side effects such as diarrhea, nausea, or vomiting. She does, however, note a mild soreness in the mouth, particularly after the second dose in the evening, which  resolves by morning.  Recent lab results indicate a slight decrease in white blood cell count (2.7, normal range 4-10) and a mild anemia with a hemoglobin level of 11.5.  The patient is also on anastrozole and receives regular injections for menopause induction. She has had two injections so far and reports a heavy menstrual cycle after the first injection, followed by spotting two weeks later. The patient's most recent Signatera test in September was negative, although she expresses some skepticism about the test's accuracy due to a previous recurrence despite negative test results.  The patient has been experiencing some discomfort and "zinging" pain at the site of a recent surgical incision. She describes the area as feeling tender and tight, particularly when lying on her right side. The patient is also considering further surgery, specifically a flat closure, and is seeking a referral to a surgeon with expertise in removing scar tissue and matrix.  The patient maintains a whole food, plant-based diet and takes a vitamin D3 K2 supplement. She also engages in weight-bearing exercises, including walking with a weighted vest. She expresses concerns about potential interactions between Verzenio and over-the-counter pain medications, as well as the process of discontinuing Verzenio after the recommended two-year treatment period.  In summary, the patient is managing multiple health concerns, including a decrease in white blood cell count, mild anemia, and post-surgical discomfort. She is actively engaged in her treatment plan, adhering to medication regimens, maintaining a healthy lifestyle, and seeking information to make informed decisions about future care.   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  MEDICAL HISTORY: Past Medical History:  Diagnosis Date   Allergy    Anemia    Asthma    Cardiomyopathy (HCC)    Chronic headaches    Delivery normal 11/2008   Family history of breast cancer    Family history of colon cancer    Family history of leukemia    Family history of melanoma    Family history of ovarian cancer    Heart murmur    Kidney stone    x of   PONV (postoperative nausea and vomiting)    S/P tonsillectomy    2000    SURGICAL HISTORY: Past Surgical History:  Procedure Laterality Date   BREAST BIOPSY Right 09/12/2022   [place] RT BREAST BX W LOC DEV 1ST LESION IMG BX SPEC US GUIDE 09/12/2022 [facility] MAMMOGRAPHY   BREAST CYST EXCISION Right 09/26/2022   Procedure: RIGHT BREAST CANCER RECURRENT EXCISION;  Surgeon: [name], MD;  Location: [facility] OR;  Service: General;  Laterality: Right;  LMA   BREAST RECONSTRUCTION WITH PLACEMENT OF TISSUE EXPANDER AND  ALLODERM Bilateral 12/06/2020   Procedure: BREAST RECONSTRUCTION WITH PLACEMENT OF TISSUE EXPANDER AND ALLODERM;  Surgeon: Glenna Fellows, MD;  Location: [facility];  Service: Plastics;  Laterality: Bilateral;   LEEP     MASTECTOMY W/ SENTINEL NODE BIOPSY Right 12/06/2020   Procedure: RIGHT MASTECTOMY WITH RIGHT AXILLARY SENTINEL LYMPH NODE BIOPSY;  Surgeon: [name], MD;  Location: [facility];  Service: General;  Laterality: Right;   REMOVAL OF BILATERAL TISSUE EXPANDERS WITH PLACEMENT OF BILATERAL BREAST IMPLANTS Bilateral 09/15/2021    Procedure: REMOVAL OF BILATERAL TISSUE EXPANDERS WITH PLACEMENT OF BILATERAL BREAST IMPLANTS;  Surgeon: Glenna Fellows, MD;  Location: [facility];  Service: Plastics;  Laterality: Bilateral;   tonsil removal  01/22/1998   TOTAL MASTECTOMY Left 12/06/2020   Procedure: LEFT TOTAL MASTECTOMY;  Surgeon: [name], MD;  Location: [facility];  Service: General;  Laterality: Left;    SOCIAL HISTORY: Social History   Socioeconomic History   Marital status: Married    Spouse name: Not on file   Number of children: 1   Years of education: Not on file   Highest education level: Not on file  Occupational History   Occupation: Housewife  Tobacco Use   Smoking status: Former    Types: Cigarettes   Smokeless tobacco: Never  Vaping Use   Vaping status: Never Used  Substance and Sexual Activity   Alcohol use: Not Currently   Drug use: No   Sexual activity: Not Currently    Partners: Male    Birth control/protection: Surgical    Comment: Husband had Vasectomy  Other Topics Concern   Not on file  Social History Narrative   Not on file   Social Determinants of Health   Financial Resource Strain: Not on file  Food Insecurity: Not on file  Transportation Needs: Not on file  Physical Activity: Not on file  Stress: Not on file  Social Connections: Unknown (10/22/2022)   [facility]   Social Network    Social Network: Not on file  Intimate Partner Violence: Unknown (10/22/2022)   Received from [facility]   HITS    Physically Hurt: Not on file    Insult or Talk Down To: Not on file    Threaten Physical Harm: Not on file    Scream or Curse: Not on file    FAMILY HISTORY: Family History  Problem Relation Age of Onset   Memory loss Mother    Ulcerative colitis Mother    Colon polyps Father    Bladder Cancer Father    Irritable bowel syndrome Sister    Leukemia Maternal Aunt 50       AML   Melanoma Maternal Aunt 16    Breast cancer Paternal Aunt 70   Colon cancer Paternal Aunt    Leukemia Paternal Aunt 13   Prostate cancer Paternal Uncle 28   Colon polyps Paternal Uncle    Cervical cancer Maternal Grandmother    Parkinson's disease Maternal Grandmother    Alzheimer's disease Maternal Grandmother    Multiple myeloma Maternal Grandfather    Skin cancer Maternal Grandfather    Skin cancer Paternal Grandmother    Colon cancer Paternal Grandfather 32   Stroke Paternal Grandfather    Ovarian cancer Other 39       MGMs sister   Esophageal cancer Neg Hx    Rectal cancer Neg Hx    Stomach cancer Neg Hx     Review of  Systems  Constitutional:  Negative for appetite change, chills, fatigue, fever and unexpected weight change.  HENT:   Negative for hearing loss, lump/mass, mouth sores and trouble swallowing.   Eyes:  Negative for eye problems and icterus.  Respiratory:  Negative for chest tightness, cough and shortness of breath.   Cardiovascular:  Negative for chest pain, leg swelling and palpitations.  Gastrointestinal:  Negative for abdominal distention, abdominal pain, constipation, diarrhea, nausea and vomiting.  Endocrine: Negative for hot flashes.  Genitourinary:  Negative for difficulty urinating.   Musculoskeletal:  Negative for arthralgias.  Skin:  Negative for itching and rash.  Neurological:  Negative for dizziness, extremity weakness, headaches and numbness.  Hematological:  Negative for adenopathy. Does not bruise/bleed easily.  Psychiatric/Behavioral:  Negative for depression. The patient is not nervous/anxious.       PHYSICAL EXAMINATION    Vitals:   11/22/22 0858  BP: 116/63  Pulse: 73  Resp: 14  Temp: 97.8 F (36.6 C)  SpO2: 100%    Physical Exam Constitutional:      General: She is not in acute distress.    Appearance: Normal appearance. She is not toxic-appearing.  HENT:     Head: Normocephalic and atraumatic.     Mouth/Throat:     Mouth: Mucous membranes are moist.      Pharynx: Oropharynx is clear. No oropharyngeal exudate or posterior oropharyngeal erythema.  Eyes:     General: No scleral icterus. Cardiovascular:     Rate and Rhythm: Normal rate and regular rhythm.     Pulses: Normal pulses.     Heart sounds: Normal heart sounds.  Pulmonary:     Effort: Pulmonary effort is normal.     Breath sounds: Normal breath sounds.  Abdominal:     General: Abdomen is flat. Bowel sounds are normal. There is no distension.     Palpations: Abdomen is soft.     Tenderness: There is no abdominal tenderness.  Musculoskeletal:        General: No swelling.     Cervical back: Neck supple.  Lymphadenopathy:     Cervical: No cervical adenopathy.  Skin:    General: Skin is warm and dry.     Findings: No rash.  Neurological:     General: No focal deficit present.     Mental Status: She is alert.  Psychiatric:        Mood and Affect: Mood normal.        Behavior: Behavior normal.     LABORATORY DATA:  CBC    Component Value Date/Time   WBC 2.7 (L) 11/22/2022 0830   WBC 4.3 09/26/2022 0835   RBC 3.82 (L) 11/22/2022 0830   HGB 11.5 (L) 11/22/2022 0830   HCT 35.5 (L) 11/22/2022 0830   PLT 246 11/22/2022 0830   MCV 92.9 11/22/2022 0830   MCH 30.1 11/22/2022 0830   MCHC 32.4 11/22/2022 0830   RDW 13.5 11/22/2022 0830   LYMPHSABS 0.7 11/22/2022 0830   MONOABS 0.2 11/22/2022 0830   EOSABS 0.1 11/22/2022 0830   BASOSABS 0.1 11/22/2022 0830    CMP     Component Value Date/Time   NA 140 11/22/2022 0830   K 3.8 11/22/2022 0830   CL 106 11/22/2022 0830   CO2 29 11/22/2022 0830   GLUCOSE 67 (L) 11/22/2022 0830   BUN 16 11/22/2022 0830   CREATININE 0.95 11/22/2022 0830   CALCIUM 9.2 11/22/2022 0830   PROT 6.6 11/22/2022 0830   ALBUMIN 4.1 11/22/2022 0830  AST 20 11/22/2022 0830   ALT 15 11/22/2022 0830   ALKPHOS 47 11/22/2022 0830   BILITOT 0.3 11/22/2022 0830   GFRNONAA >60 11/22/2022 0830         ASSESSMENT and THERAPY PLAN:    Bilateral breast cancer (HCC) 12/06/20: Bilateral Mastectomies:  Left Mastectomy:HG DCIS 2.2 cm with necrosis, Margins Neg ER 90%, PR 40% Right Mastectomy: Grade 2 IDC with DCIS 3.8 cm, 1/3 LN ER 100%, PR 100%, Her 2 Neg, Ki 67: 2-15%   Treatment Plan: 1. Mammaprint: Luminal type a low risk 2. Adjuvant XRT completed 03/14/2021 3. Adjuvant Anti-estrogen therapy with tamoxifen to start 03/22/2021-08/29/22 --------------------------------------------------------------------------------------------------------------------- Right breast palpable mass: Ultrasound: 6 mm, biopsy: Grade 2 IDC ER 95%, PR 95%, Ki67 30%, HER2 2+ by IHC FISH negative 09/25/2022: PET/CT: Benign   Treatment plan: Right lumpectomy: 09/26/2022: Recurrent IDC grade 2 measuring 0.8 cm, margins negative,ER 95%, PR 95%, Ki67 30%, HER2 2+ by IHC FISH negative Adjuvant ovarian function suppression with anastrozole.  Along with Verzinio   Surveillance: She will continue with [employer]atera for surveillance along with that we will obtain PET CT scans annually.    Breast Cancer on Verzenio Mild anemia (Hb 11.5) and leukopenia (WBC 2.7) noted, likely due to [employer]. No symptoms of infection reported. Discussed potential need for dose adjustment if leukopenia worsens. -Monitor blood counts closely. -Notify team if symptoms of infection (fever, chills) develop. -Return in 4 weeks for follow-up with Dr. Georgiann Mohs and repeat labs.  Oral discomfort with Verzenio Reports oral discomfort, particularly after evening dose. Oral examination unremarkable. -Continue Verzenio as prescribed. -Report any new or worsening oral symptoms.  Surgical considerations Patient considering flat closure due to concerns about potential microscopic cancer cell spread to donor skin used in reconstruction. -Discuss further with Dr. Dwain Sarna and Dr. Pamelia Hoit. -Consider referral to another surgeon if needed.  Hypoglycemia Mild hypoglycemia noted on labs. No symptoms  reported. -Monitor blood sugar levels. -Report any symptoms of hypoglycemia (shakiness, lightheadedness).  Zoladex injections Patient on Zoladex injections for ovarian suppression. Reports heavy cycle after first injection, but no further cycles. -Continue Zoladex injections as scheduled. -Consider adjusting timing of injections to accommodate upcoming vacation.  Nutrition and supplementation, osteopenia Patient follows a whole food, plant-based diet and takes Vitamin D3/K2 supplement.  She practices weight bearing exercises regularly.  -Continue weight bearing exercise.  -Continue current diet and supplementation. -Consider checking B12 level. -Confirm with pharmacy if folic acid supplementation is appropriate with Verzenio.  RTC on 11/14 f/u with Marylynn Pearson, CPP.  All questions were answered. The patient knows to call the clinic with any problems, questions or concerns. We can certainly see the patient much sooner if necessary.  Total encounter time:45 minutes*in face-to-face visit time, chart review, lab review, care coordination, order entry, and documentation of the encounter time.  Lillard Anes, NP 11/22/22 10:03 AM Medical Oncology and Hematology [facility] [address] [place], [place] [address] Tel. [phone]    Fax. [phone]  *Total Encounter Time as defined by the Centers for Medicare and Medicaid Services includes, in addition to the face-to-face time of a patient visit (documented in the note above) non-face-to-face time: obtaining and reviewing outside history, ordering and reviewing medications, tests or procedures, care coordination (communications with other health care professionals or caregivers) and documentation in the medical record.

## 2022-11-22 NOTE — Assessment & Plan Note (Signed)
12/06/20: Bilateral Mastectomies:  Left Mastectomy:HG DCIS 2.2 cm with necrosis, Margins Neg ER 90%, PR 40% Right Mastectomy: Grade 2 IDC with DCIS 3.8 cm, 1/3 LN ER 100%, PR 100%, Her 2 Neg, Ki 67: 2-15%   Treatment Plan: 1. Mammaprint: Luminal type a low risk 2. Adjuvant XRT completed 03/14/2021 3. Adjuvant Anti-estrogen therapy with tamoxifen to start 03/22/2021-08/29/22 --------------------------------------------------------------------------------------------------------------------- Right breast palpable mass: Ultrasound: 6 mm, biopsy: Grade 2 IDC ER 95%, PR 95%, Ki67 30%, HER2 2+ by IHC FISH negative 09/25/2022: PET/CT: Benign   Treatment plan: Right lumpectomy: 09/26/2022: Recurrent IDC grade 2 measuring 0.8 cm, margins negative,ER 95%, PR 95%, Ki67 30%, HER2 2+ by IHC FISH negative Adjuvant ovarian function suppression with anastrozole.  Along with Verzinio   Surveillance: She will continue with [employer] for surveillance along with that we will obtain PET CT scans annually.    Breast Cancer on Verzenio Mild anemia (Hb 11.5) and leukopenia (WBC 2.7) noted, likely due to [employer]. No symptoms of infection reported. Discussed potential need for dose adjustment if leukopenia worsens. -Monitor blood counts closely. -Notify team if symptoms of infection (fever, chills) develop. -Return in 4 weeks for follow-up with Dr. Georgiann Mohs and repeat labs.  Oral discomfort with Verzenio Reports oral discomfort, particularly after evening dose. Oral examination unremarkable. -Continue Verzenio as prescribed. -Report any new or worsening oral symptoms.  Surgical considerations Patient considering flat closure due to concerns about potential microscopic cancer cell spread to donor skin used in reconstruction. -Discuss further with Dr. Dwain Sarna and Dr. Pamelia Hoit. -Consider referral to another surgeon if needed.  Hypoglycemia Mild hypoglycemia noted on labs. No symptoms reported. -Monitor blood sugar  levels. -Report any symptoms of hypoglycemia (shakiness, lightheadedness).  Zoladex injections Patient on Zoladex injections for ovarian suppression. Reports heavy cycle after first injection, but no further cycles. -Continue Zoladex injections as scheduled. -Consider adjusting timing of injections to accommodate upcoming vacation.  Nutrition and supplementation, osteopenia Patient follows a whole food, plant-based diet and takes Vitamin D3/K2 supplement.  She practices weight bearing exercises regularly.  -Continue weight bearing exercise.  -Continue current diet and supplementation. -Consider checking B12 level. -Confirm with pharmacy if folic acid supplementation is appropriate with Verzenio.  RTC on 11/14 f/u with Marylynn Pearson, CPP.

## 2022-11-28 LAB — ESTRADIOL, ULTRA SENS: Estradiol, Sensitive: <2.5 pg/mL

## 2022-12-04 ENCOUNTER — Encounter: Payer: Self-pay | Admitting: Rehabilitation

## 2022-12-04 ENCOUNTER — Ambulatory Visit: Payer: 59 | Attending: Adult Health | Admitting: Rehabilitation

## 2022-12-04 DIAGNOSIS — Z170 Estrogen receptor positive status [ER+]: Secondary | ICD-10-CM | POA: Insufficient documentation

## 2022-12-04 DIAGNOSIS — Z483 Aftercare following surgery for neoplasm: Secondary | ICD-10-CM | POA: Insufficient documentation

## 2022-12-04 DIAGNOSIS — C50911 Malignant neoplasm of unspecified site of right female breast: Secondary | ICD-10-CM | POA: Insufficient documentation

## 2022-12-04 NOTE — Therapy (Signed)
OUTPATIENT PHYSICAL THERAPY SOZO SCREENING NOTE   Patient Name: Kaitlin [name] MRN: [id number] DOB:03/25/1975, 47 y.o., female Today's Date: 12/04/2022  PCP: Merri Brunette, MD REFERRING PROVIDER: Lillard Anes Cornett*   PT End of Session - 12/04/22 1000     Visit Number 12   screen only   PT Start Time 0955    PT Stop Time 1000    PT Time Calculation (min) 5 min    Activity Tolerance Patient tolerated treatment well    Behavior During Therapy [facility] for tasks assessed/performed             Past Medical History:  Diagnosis Date   Allergy    Anemia    Asthma    Cardiomyopathy (HCC)    Chronic headaches    Delivery normal 11/2008   Family history of breast cancer    Family history of colon cancer    Family history of leukemia    Family history of melanoma    Family history of ovarian cancer    Heart murmur    Kidney stone    x of   PONV (postoperative nausea and vomiting)    S/P tonsillectomy    2000   Past Surgical History:  Procedure Laterality Date   BREAST BIOPSY Right 09/12/2022   [place] RT BREAST BX W LOC DEV 1ST LESION IMG BX SPEC US GUIDE 09/12/2022 [facility]MO[facility]AP[facility]   BREAST CYST EXCISION Right 09/26/2022   Procedure: RIGHT BREAST CANCER RECURRENT EXCISION;  Surgeon: [name], MD;  Location: [facility];  Service: General;  Laterality: Right;  LMA   BREAST RECONSTRUCTION WITH PLACEMENT OF TISSUE EXPANDER AND ALLODERM Bilateral 12/06/2020   Procedure: BREAST RECONSTRUCTION WITH PLACEMENT OF TISSUE EXPANDER AND ALLODERM;  Surgeon: Glenna Fellows, MD;  Location: [facility];  Service: Plastics;  Laterality: Bilateral;   LEEP     MASTECTOMY W/ SENTINEL NODE BIOPSY Right 12/06/2020   Procedure: RIGHT MASTECTOMY WITH RIGHT AXILLARY SENTINEL LYMPH NODE BIOPSY;  Surgeon: [name], MD;  Location: [facility];  Service: General;  Laterality: Right;   REMOVAL OF BILATERAL TISSUE EXPANDERS WITH PLACEMENT OF BILATERAL BREAST  IMPLANTS Bilateral 09/15/2021   Procedure: REMOVAL OF BILATERAL TISSUE EXPANDERS WITH PLACEMENT OF BILATERAL BREAST IMPLANTS;  Surgeon: Glenna Fellows, MD;  Location: [facility];  Service: Plastics;  Laterality: Bilateral;   tonsil removal  01/22/1998   TOTAL MASTECTOMY Left 12/06/2020   Procedure: LEFT TOTAL MASTECTOMY;  Surgeon: [name], MD;  Location:  SURGERY CENTER;  Service: General;  Laterality: Left;   Patient Active Problem List   Diagnosis Date Noted   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   Malignant neoplasm of upper-outer quadrant of right breast in female, estrogen receptor positive (HCC) 12/12/2020   S/P bilateral mastectomy 12/06/2020   Genetic testing 11/17/2020   Family history of breast cancer 11/02/2020   Family history of ovarian cancer 11/02/2020   Family history of colon cancer 11/02/2020   Family history of melanoma 11/02/2020   Family history of leukemia 11/02/2020   Bilateral breast cancer (HCC) 11/01/2020   LLQ abdominal pain 04/06/2011    REFERRING DIAG: right breast cancer at risk for lymphedema  THERAPY DIAG: Aftercare following surgery for neoplasm  Malignant neoplasm of right breast in female, estrogen receptor positive, unspecified site of breast (HCC)  PERTINENT HISTORY: Bil mastectomy with immediate reconstruction 12/06/20 with 1/3 LN positive.  Rt Grade 2 IDC with DCIS ER positive.  Lt  breast DCIS intermediate, no LN. Radiation 01/27/21-03/14/2021 to Right chest.  Implants 09/15/21   PRECAUTIONS: right UE Lymphedema risk, None  SUBJECTIVE: Pt returns for her 3 month L-Dex screen.   PAIN:  Are you having pain? No  SOZO SCREENING: Patient was assessed today using the SOZO machine to determine the lymphedema index score. This was compared to her baseline score. It was determined that she is within the recommended range when compared to her baseline and no further action is needed at this time.  She will continue SOZO screenings. These are done every 3 months for 2 years post operatively followed by every 6 months for 2 years, and then annually.   L-DEX FLOWSHEETS - 12/04/22 1000       L-DEX LYMPHEDEMA SCREENING   Measurement Type Unilateral    L-DEX MEASUREMENT EXTREMITY Upper Extremity    POSITION  Standing    DOMINANT SIDE Right    At Risk Side Right    BASELINE SCORE (UNILATERAL) -0.2    L-DEX SCORE (UNILATERAL) -0.5    VALUE CHANGE (UNILAT) -0.3                Jasiya Markie R, PT 12/04/2022, 10:01 AM

## 2022-12-06 ENCOUNTER — Encounter: Payer: Self-pay | Admitting: Hematology and Oncology

## 2022-12-06 ENCOUNTER — Inpatient Hospital Stay: Payer: 59 | Attending: Hematology and Oncology

## 2022-12-06 ENCOUNTER — Inpatient Hospital Stay: Payer: 59 | Admitting: Pharmacist

## 2022-12-06 ENCOUNTER — Inpatient Hospital Stay: Payer: 59

## 2022-12-06 VITALS — BP 102/55 | HR 62 | Temp 97.6°F | Resp 18 | Wt 137.4 lb

## 2022-12-06 DIAGNOSIS — Z808 Family history of malignant neoplasm of other organs or systems: Secondary | ICD-10-CM | POA: Insufficient documentation

## 2022-12-06 DIAGNOSIS — Z818 Family history of other mental and behavioral disorders: Secondary | ICD-10-CM | POA: Diagnosis not present

## 2022-12-06 DIAGNOSIS — Z5111 Encounter for antineoplastic chemotherapy: Secondary | ICD-10-CM | POA: Insufficient documentation

## 2022-12-06 DIAGNOSIS — C50411 Malignant neoplasm of upper-outer quadrant of right female breast: Secondary | ICD-10-CM | POA: Insufficient documentation

## 2022-12-06 DIAGNOSIS — Z1721 Progesterone receptor positive status: Secondary | ICD-10-CM | POA: Diagnosis not present

## 2022-12-06 DIAGNOSIS — Z170 Estrogen receptor positive status [ER+]: Secondary | ICD-10-CM | POA: Diagnosis not present

## 2022-12-06 DIAGNOSIS — Z8379 Family history of other diseases of the digestive system: Secondary | ICD-10-CM | POA: Insufficient documentation

## 2022-12-06 DIAGNOSIS — Z87891 Personal history of nicotine dependence: Secondary | ICD-10-CM | POA: Diagnosis not present

## 2022-12-06 DIAGNOSIS — Z8041 Family history of malignant neoplasm of ovary: Secondary | ICD-10-CM | POA: Diagnosis not present

## 2022-12-06 DIAGNOSIS — Z8049 Family history of malignant neoplasm of other genital organs: Secondary | ICD-10-CM | POA: Diagnosis not present

## 2022-12-06 DIAGNOSIS — Z8052 Family history of malignant neoplasm of bladder: Secondary | ICD-10-CM | POA: Diagnosis not present

## 2022-12-06 DIAGNOSIS — Z79899 Other long term (current) drug therapy: Secondary | ICD-10-CM | POA: Insufficient documentation

## 2022-12-06 DIAGNOSIS — C50911 Malignant neoplasm of unspecified site of right female breast: Secondary | ICD-10-CM

## 2022-12-06 DIAGNOSIS — Z807 Family history of other malignant neoplasms of lymphoid, hematopoietic and related tissues: Secondary | ICD-10-CM | POA: Insufficient documentation

## 2022-12-06 DIAGNOSIS — Z800 Family history of malignant neoplasm of digestive organs: Secondary | ICD-10-CM | POA: Diagnosis not present

## 2022-12-06 DIAGNOSIS — Z806 Family history of leukemia: Secondary | ICD-10-CM | POA: Diagnosis not present

## 2022-12-06 LAB — CMP (CANCER CENTER ONLY)
ALT: 14 U/L (ref 0–44)
AST: 18 U/L (ref 15–41)
Albumin: 4.3 g/dL (ref 3.5–5.0)
Alkaline Phosphatase: 47 U/L (ref 38–126)
Anion gap: 4 — ABNORMAL LOW (ref 5–15)
BUN: 17 mg/dL (ref 6–20)
CO2: 31 mmol/L (ref 22–32)
Calcium: 9.5 mg/dL (ref 8.9–10.3)
Chloride: 107 mmol/L (ref 98–111)
Creatinine: 0.88 mg/dL (ref 0.44–1.00)
GFR, Estimated: >60 mL/min (ref >60)
Glucose, Bld: 79 mg/dL (ref 70–99)
Potassium: 4.2 mmol/L (ref 3.5–5.1)
Sodium: 142 mmol/L (ref 135–145)
Total Bilirubin: 0.3 mg/dL (ref <1.2)
Total Protein: 7 g/dL (ref 6.5–8.1)

## 2022-12-06 LAB — CBC WITH DIFFERENTIAL (CANCER CENTER ONLY)
Abs Immature Granulocytes: 0.01 10*3/uL (ref 0.00–0.07)
Basophils Absolute: 0.1 10*3/uL (ref 0.0–0.1)
Basophils Relative: 3 %
Eosinophils Absolute: 0.1 10*3/uL (ref 0.0–0.5)
Eosinophils Relative: 2 %
HCT: 33.9 % — ABNORMAL LOW (ref 36.0–46.0)
Hemoglobin: 11.1 g/dL — ABNORMAL LOW (ref 12.0–15.0)
Immature Granulocytes: 0 %
Lymphocytes Relative: 26 %
Lymphs Abs: 0.7 10*3/uL (ref 0.7–4.0)
MCH: 30.6 pg (ref 26.0–34.0)
MCHC: 32.7 g/dL (ref 30.0–36.0)
MCV: 93.4 fL (ref 80.0–100.0)
Monocytes Absolute: 0.3 10*3/uL (ref 0.1–1.0)
Monocytes Relative: 10 %
Neutro Abs: 1.6 10*3/uL — ABNORMAL LOW (ref 1.7–7.7)
Neutrophils Relative %: 59 %
Platelet Count: 202 10*3/uL (ref 150–400)
RBC: 3.63 MIL/uL — ABNORMAL LOW (ref 3.87–5.11)
RDW: 14.6 % (ref 11.5–15.5)
WBC Count: 2.7 10*3/uL — ABNORMAL LOW (ref 4.0–10.5)
nRBC: 0 % (ref 0.0–0.2)

## 2022-12-06 MED ORDER — GOSERELIN ACETATE 3.6 MG ~~LOC~~ IMPL
3.6000 mg | DRUG_IMPLANT | Freq: Once | SUBCUTANEOUS | Status: AC
  Administered 2022-12-06: 3.6 mg via SUBCUTANEOUS
  Filled 2022-12-06: qty 3.6

## 2022-12-06 NOTE — Patient Instructions (Signed)
Goserelin Implant What is this medication? GOSERELIN (GOE se rel in) treats prostate cancer and breast cancer. It works by decreasing levels of the hormones testosterone and estrogen in the body. This prevents prostate and breast cancer cells from spreading or growing. It may also be used to treat endometriosis. This is a condition where the tissue that lines the uterus grows outside the uterus. It works by decreasing the amount of estrogen your body makes, which reduces heavy bleeding and pain. It can also be used to help thin the lining of the uterus before a surgery used to prevent or reduce heavy periods. This medicine may be used for other purposes; ask your health care provider or pharmacist if you have questions. COMMON BRAND NAME(S): Zoladex, Zoladex 3-Month What should I tell my care team before I take this medication? They need to know if you have any of these conditions: Bone problems Diabetes Heart disease History of irregular heartbeat or rhythm An unusual or allergic reaction to goserelin, other medications, foods, dyes, or preservatives Pregnant or trying to get pregnant Breastfeeding How should I use this medication? This medication is injected under the skin. It is given by your care team in a hospital or clinic setting. Talk to your care team about the use of this medication in children. Special care may be needed. Overdosage: If you think you have taken too much of this medicine contact a poison control center or emergency room at once. NOTE: This medicine is only for you. Do not share this medicine with others. What if I miss a dose? Keep appointments for follow-up doses. It is important not to miss your dose. Call your care team if you are unable to keep an appointment. What may interact with this medication? Do not take this medication with any of the following: Cisapride Dronedarone Pimozide Thioridazine This medication may also interact with the following: Other  medications that cause heart rhythm changes This list may not describe all possible interactions. Give your health care provider a list of all the medicines, herbs, non-prescription drugs, or dietary supplements you use. Also tell them if you smoke, drink alcohol, or use illegal drugs. Some items may interact with your medicine. What should I watch for while using this medication? Visit your care team for regular checks on your progress. Your symptoms may appear to get worse during the first weeks of this therapy. Tell your care team if your symptoms do not start to get better or if they get worse after this time. Using this medication for a long time may weaken your bones. If you smoke or frequently drink alcohol you may increase your risk of bone loss. A family history of osteoporosis, chronic use of medications for seizures (convulsions), or corticosteroids can also increase your risk of bone loss. The risk of bone fractures may be increased. Talk to your care team about your bone health. This medication may increase blood sugar. The risk may be higher in patients who already have diabetes. Ask your care team what you can do to lower your risk of diabetes while taking this medication. This medication should stop regular monthly menstruation in women. Tell your care team if you continue to menstruate. Talk to your care team if you wish to become pregnant or think you might be pregnant. This medication can cause serious birth defects if taken during pregnancy or for 12 weeks after stopping treatment. Talk to your care team about reliable forms of contraception. Do not breastfeed while taking this   medication. This medication may cause infertility. Talk to your care team if you are concerned about your fertility. What side effects may I notice from receiving this medication? Side effects that you should report to your care team as soon as possible: Allergic reactions--skin rash, itching, hives, swelling  of the face, lips, tongue, or throat Change in the amount of urine Heart attack--pain or tightness in the chest, shoulders, arms, or jaw, nausea, shortness of breath, cold or clammy skin, feeling faint or lightheaded Heart rhythm changes--fast or irregular heartbeat, dizziness, feeling faint or lightheaded, chest pain, trouble breathing High blood sugar (hyperglycemia)--increased thirst or amount of urine, unusual weakness or fatigue, blurry vision High calcium level--increased thirst or amount of urine, nausea, vomiting, confusion, unusual weakness or fatigue, bone pain Pain, redness, irritation, or bruising at the injection site Severe back pain, numbness or weakness of the hands, arms, legs, or feet, loss of coordination, loss of bowel or bladder control Stroke--sudden numbness or weakness of the face, arm, or leg, trouble speaking, confusion, trouble walking, loss of balance or coordination, dizziness, severe headache, change in vision Swelling and pain of the tumor site or lymph nodes Trouble passing urine Side effects that usually do not require medical attention (report to your care team if they continue or are bothersome): Change in sex drive or performance Headache Hot flashes Rapid or extreme change in emotion or mood Sweating Swelling of the ankles, hands, or feet Unusual vaginal discharge, itching, or odor This list may not describe all possible side effects. Call your doctor for medical advice about side effects. You may report side effects to FDA at 1-800-FDA-1088. Where should I keep my medication? This medication is given in a hospital or clinic. It will not be stored at home. NOTE: This sheet is a summary. It may not cover all possible information. If you have questions about this medicine, talk to your doctor, pharmacist, or health care provider.  2024 Elsevier/Gold Standard (2021-06-01 00:00:00)  

## 2022-12-06 NOTE — Progress Notes (Signed)
[facility]       Telephone: [phone]?Fax: [phone]   Oncology Clinical Pharmacist Practitioner Progress Note  Kaitlin [name] was contacted via in-person to discuss her chemotherapy regimen for abemaciclib which they receive under the care of Dr. Serena Croissant.  Current treatment regimen and start date Abemaciclib (11/09/22) Anastrozole (10/12/22) Goserelin (10/11/22)  Interval History She continues on abemaciclib 100 mg by mouth every 12 hours on days 1 to 28 of a 28-day cycle. This is being given in combination with anastrozole and goserelin . Therapy is planned to continue until two years in the adjuvant setting per the monarchE trial data. She last saw Lillard Anes NP on 11/22/22 and Dr. Pamelia Hoit on 10/12/22. Clinical pharmacy saw her last on 11/08/22.  She has a GYN appointment next week as a follow up to a hemorrhagic cyst that was found in May 2024. She reports similar spot pain recently but much less in severity.   Response to Therapy Overall, she is tolerating abemaciclib well. She reports using loperamide two times on separate occasions. She is due for goserelin today. We discussed going up on the abemaciclib dose if her symptoms remain minimal and she said she would think about it. We will see her again in 2 week and we will move her lab appointment with Dr. Pamelia Hoit to 01/03/23 when she is next due for goserelin.  Labs, vitals, treatment parameters, and manufacturer guidelines assessing toxicity were reviewed with Kaitlin [name] today. Based on these values, patient is in agreement to continue abemaciclib therapy at this time.  Allergies Allergies  Allergen Reactions   Ceclor [Cefaclor] Hives and Shortness Of Breath   Cephalosporins Anaphylaxis and Hives   Rizatriptan Anaphylaxis   Amoxicillin Hives and Rash   Penicillins Other (See Comments)    Unknown reaction     Vitals    12/06/2022    9:58 AM 11/22/2022    8:58 AM 11/08/2022    2:30 PM   Oncology Vitals  Height  170 cm 170 cm  Weight 62.324 kg 60.782 kg 61.961 kg  Weight (lbs) 137 lbs 6 oz 134 lbs 136 lbs 10 oz  BMI 21.52 kg/m2 20.99 kg/m2 21.39 kg/m2  Temp 97.6 F (36.4 C) 97.8 F (36.6 C) 97.7 F (36.5 C)  Pulse Rate 62 73 69  BP 102/55 116/63 116/45  Resp 18 14 18   SpO2 100 % 100 % 100 %  BSA (m2) 1.72 m2 1.7 m2 1.71 m2    Laboratory Data    Latest Ref Rng & Units 12/06/2022    9:46 AM 11/22/2022    8:30 AM 11/08/2022    2:03 PM  CBC EXTENDED  WBC 4.0 - 10.5 K/uL 2.7  2.7  4.8   RBC 3.87 - 5.11 MIL/uL 3.63  3.82  3.72   Hemoglobin 12.0 - 15.0 g/dL 57.3  22.0  25.4   HCT 36.0 - 46.0 % 33.9  35.5  34.7   Platelets 150 - 400 K/uL 202  246  275   NEUT# 1.7 - 7.7 K/uL 1.6  1.6  3.0   Lymph# 0.7 - 4.0 K/uL 0.7  0.7  1.1        Latest Ref Rng & Units 12/06/2022    9:46 AM 11/22/2022    8:30 AM 11/08/2022    2:03 PM  CMP  Glucose 70 - 99 mg/dL 79  67  270   BUN 6 - 20 mg/dL 17  16  25  Creatinine 0.44 - 1.00 mg/dL 9.56  2.13  0.86   Sodium 135 - 145 mmol/L 142  140  138   Potassium 3.5 - 5.1 mmol/L 4.2  3.8  4.1   Chloride 98 - 111 mmol/L 107  106  105   CO2 22 - 32 mmol/L 31  29  28    Calcium 8.9 - 10.3 mg/dL 9.5  9.2  9.2   Total Protein 6.5 - 8.1 g/dL 7.0  6.6  7.0   Total Bilirubin <1.2 mg/dL 0.3  0.3  0.3   Alkaline Phos 38 - 126 U/L 47  47  47   AST 15 - 41 U/L 18  20  18    ALT 0 - 44 U/L 14  15  17     Adverse Effects Assessment Hgb: stable ANC: stable Platelets: WNL but decreased  Adherence Assessment Lynita Lombard Chaudhari reports missing 0 doses over the past 2 weeks.   Reason for missed dose: N/A Patient was re-educated on importance of adherence.   Access Assessment SHERESE TATTON is currently receiving her abemaciclib through [employer] concerns:  none  Medication Reconciliation The patient's medication list was reviewed today with the patient? Yes New medications or herbal supplements have recently  been started? No  Any medications have been discontinued? No  The medication list was updated and reconciled based on the patient's most recent medication list in the electronic medical record (EMR) including herbal products and OTC medications.   Medications Current Outpatient Medications  Medication Sig Dispense Refill   anastrozole (ARIMIDEX) 1 MG tablet Take 1 tablet (1 mg total) by mouth daily. 90 tablet 3   ivermectin (STROMECTOL) 3 MG TABS tablet Take 12 mg by mouth once. Twice a week (Monday, Thursday)     Magnesium Glycinate 120 MG CAPS Take 240 mg by mouth at bedtime.     Misc Natural Products (CORTISOL MANAGER) TABS Take 1 tablet by mouth daily.     MISC NATURAL PRODUCTS PO Take by mouth daily. Super Digestive Enzymes by [employer]     VERZENIO 100 MG tablet TAKE 1 TABLET BY MOUTH TWICE  DAILY 28 tablet 0   Vitamin D-Vitamin K (VITAMIN K2-VITAMIN D3 PO) Take by mouth daily. Vitamin D: 1.25 mcg / Vitamin K: 90 mcg     ALPRAZolam (XANAX) 0.25 MG tablet Take 1 tablet (0.25 mg total) by mouth 2 (two) times daily as needed for anxiety. (Patient not taking: Reported on 11/08/2022) 30 tablet 0   GLUTATHIONE PO Take 250 mg by mouth daily. Usually takes twice a week (Monday, Thursday) (Patient not taking: Reported on 12/06/2022)     Multiple Vitamin (MULTIVITAMIN WITH MINERALS) TABS tablet Take 1 tablet by mouth daily. (Patient not taking: Reported on 12/06/2022)     Omega-3 Fatty Acids (FISH OIL PO) Take 2 capsules by mouth daily. 1200 mg Omega 3 (Patient not taking: Reported on 12/06/2022)     No current facility-administered medications for this visit.    Drug-Drug Interactions (DDIs) DDIs were evaluated? Yes Significant DDIs?  She is on several herbal supplements and we again discussed that these are not regulated by the FDA and difficult to assess possible interactions and purity of these agents. The patient was instructed to speak with their health care provider and/or the oral  chemotherapy pharmacist before starting any new drug, including prescription or over the counter, natural / herbal products, or vitamins.  Supportive Care Diarrhea: we reviewed that diarrhea is common with abemaciclib  and confirmed that she does have loperamide (Imodium) at home.  We reviewed how to take this medication PRN. Neutropenia: we discussed the importance of having a thermometer and what the Centers for Disease Control and Prevention (CDC) considers a fever which is 100.65F (38C) or higher.  Gave patient 24/7 triage line to call if any fevers or symptoms. ILD/Pneumonitis: we reviewed potential symptoms including cough, shortness, and fatigue.  VTE: reviewed signs of DVT such as leg swelling, redness, pain, or tenderness and signs of PE such as shortness of breath, rapid or irregular heartbeat, cough, chest pain, or lightheadedness. Reviewed to take the medication every 12 hours (with food sometimes can be easier on the stomach) and to take it at the same time every day. Hepatotoxicity:WNL Drug interactions with grapefruit products  Dosing Assessment Hepatic adjustments needed? No  Renal adjustments needed? No  Toxicity adjustments needed? No  The current dosing regimen is appropriate to continue at this time.  Follow-Up Plan Continue abemaciclib 100 mg by mouth every 12 hours Continue anastrozole 1 mg by mouth daily Continue goserelin 3.6 mg subcutaneously every 28 days. Dose today, then 01/03/23, 01/31/23, 02/28/23, 03/28/23 Monitor WBC, Hgb, ANC Monitor pain in lower abdominal area. Sees GYN next week. Had hemorrhagic cyst in same area in May of this year. Will add goserelin injection to 01/03/23 and move lab, Dr. Pamelia Hoit visit from 12/25/22 to 01/03/23 Will add lab, pharmacy clinic visit on 12/19/22  Kaitlin [name] participated in the discussion, expressed understanding, and voiced agreement with the above plan. All questions were answered to her satisfaction. The patient was  advised to contact the clinic at [phone] with any questions or concerns prior to her return visit.   I spent 30 minutes assessing and educating the patient.  Agape Hardiman A. Odetta Pink, PharmD, BCOP, CPP  Anselm Lis, RPH-CPP, 12/06/2022  12:06 PM   **Disclaimer: This note was dictated with voice recognition software. Similar sounding words can inadvertently be transcribed and this note may contain transcription errors which may not have been corrected upon publication of note.**

## 2022-12-12 ENCOUNTER — Encounter: Payer: Self-pay | Admitting: Pharmacist

## 2022-12-12 LAB — ESTRADIOL, ULTRA SENS: Estradiol, Sensitive: <2.5 pg/mL

## 2022-12-14 ENCOUNTER — Other Ambulatory Visit: Payer: Self-pay | Admitting: Hematology and Oncology

## 2022-12-14 DIAGNOSIS — C50911 Malignant neoplasm of unspecified site of right female breast: Secondary | ICD-10-CM

## 2022-12-19 ENCOUNTER — Inpatient Hospital Stay: Payer: 59 | Admitting: Pharmacist

## 2022-12-19 ENCOUNTER — Inpatient Hospital Stay: Payer: 59

## 2022-12-19 VITALS — BP 113/59 | HR 66 | Temp 97.9°F | Resp 16 | Wt 136.4 lb

## 2022-12-19 DIAGNOSIS — C50911 Malignant neoplasm of unspecified site of right female breast: Secondary | ICD-10-CM

## 2022-12-19 DIAGNOSIS — Z170 Estrogen receptor positive status [ER+]: Secondary | ICD-10-CM

## 2022-12-19 DIAGNOSIS — Z5111 Encounter for antineoplastic chemotherapy: Secondary | ICD-10-CM | POA: Diagnosis not present

## 2022-12-19 LAB — CBC WITH DIFFERENTIAL (CANCER CENTER ONLY)
Abs Immature Granulocytes: 0 10*3/uL (ref 0.00–0.07)
Basophils Absolute: 0.1 10*3/uL (ref 0.0–0.1)
Basophils Relative: 3 %
Eosinophils Absolute: 0 10*3/uL (ref 0.0–0.5)
Eosinophils Relative: 2 %
HCT: 34.5 % — ABNORMAL LOW (ref 36.0–46.0)
Hemoglobin: 11.4 g/dL — ABNORMAL LOW (ref 12.0–15.0)
Immature Granulocytes: 0 %
Lymphocytes Relative: 26 %
Lymphs Abs: 0.6 10*3/uL — ABNORMAL LOW (ref 0.7–4.0)
MCH: 30.8 pg (ref 26.0–34.0)
MCHC: 33 g/dL (ref 30.0–36.0)
MCV: 93.2 fL (ref 80.0–100.0)
Monocytes Absolute: 0.2 10*3/uL (ref 0.1–1.0)
Monocytes Relative: 9 %
Neutro Abs: 1.5 10*3/uL — ABNORMAL LOW (ref 1.7–7.7)
Neutrophils Relative %: 60 %
Platelet Count: 287 10*3/uL (ref 150–400)
RBC: 3.7 MIL/uL — ABNORMAL LOW (ref 3.87–5.11)
RDW: 15.9 % — ABNORMAL HIGH (ref 11.5–15.5)
WBC Count: 2.5 10*3/uL — ABNORMAL LOW (ref 4.0–10.5)
nRBC: 0 % (ref 0.0–0.2)

## 2022-12-19 LAB — CMP (CANCER CENTER ONLY)
ALT: 41 U/L (ref 0–44)
AST: 40 U/L (ref 15–41)
Albumin: 4.4 g/dL (ref 3.5–5.0)
Alkaline Phosphatase: 59 U/L (ref 38–126)
Anion gap: 4 — ABNORMAL LOW (ref 5–15)
BUN: 17 mg/dL (ref 6–20)
CO2: 30 mmol/L (ref 22–32)
Calcium: 9.7 mg/dL (ref 8.9–10.3)
Chloride: 105 mmol/L (ref 98–111)
Creatinine: 0.97 mg/dL (ref 0.44–1.00)
GFR, Estimated: >60 mL/min (ref >60)
Glucose, Bld: 84 mg/dL (ref 70–99)
Potassium: 4.1 mmol/L (ref 3.5–5.1)
Sodium: 139 mmol/L (ref 135–145)
Total Bilirubin: 0.4 mg/dL (ref <1.2)
Total Protein: 7.2 g/dL (ref 6.5–8.1)

## 2022-12-19 NOTE — Progress Notes (Signed)
[facility]       Telephone: [phone]?Fax: [phone]   Oncology Clinical Pharmacist Practitioner Progress Note  Kaitlin [name] was contacted via in-person to discuss her chemotherapy regimen for abemaciclib which they receive under the care of Dr. Serena Croissant.   Current treatment regimen and start date Abemaciclib (11/09/22) Anastrozole (10/12/22) Goserelin (10/11/22)   Interval History She continues on abemaciclib 100 mg by mouth every 12 hours on days 1 to 28 of a 28-day cycle. This is being given in combination with anastrozole and goserelin . Therapy is planned to continue until two years in the adjuvant setting per the monarch[employer] trial data. She last saw Lillard Anes NP on 11/22/22 and Dr. Pamelia Hoit on 10/12/22. Clinical pharmacy saw her last on 12/06/22.   Her GYN appointment went well. They will be imaging her on 01/01/23 but believe it may be endometriosis. She states the pain is subsided some. She sees Dr. Pamelia Hoit next with labs on 01/03/23 when she is next due for every 4 week goserelin.   Response to Therapy Mrs. Steichen continues to tolerate abemaciclib well.  She has been having some loose stools which she attributes to food she has eaten.  She has not needed to take any loperamide.  She has not missed any doses of abemaciclib and she has not had any changes to her medication list.  She is planning a trip to [place] but we confirmed at her last visit that this will not affect her goserelin injections.  As above, she will see Dr. Pamelia Hoit next on 01/03/23 with labs when she is next due for goserelin.  Dr. Pamelia Hoit will likely have her see clinical pharmacy then again after that visit sometime in January 2025. Labs, vitals, treatment parameters, and manufacturer guidelines assessing toxicity were reviewed with Kaitlin [name] today. Based on these values, patient is in agreement to continue abemaciclib therapy at this time.  Allergies Allergies  Allergen Reactions    Ceclor [Cefaclor] Hives and Shortness Of Breath   Cephalosporins Anaphylaxis and Hives   Rizatriptan Anaphylaxis   Amoxicillin Hives and Rash   Penicillins Other (See Comments)    Unknown reaction     Vitals    12/19/2022   10:03 AM 12/06/2022    9:58 AM 11/22/2022    8:58 AM  Oncology Vitals  Height   170 cm  Weight 61.871 kg 62.324 kg 60.782 kg  Weight (lbs) 136 lbs 6 oz 137 lbs 6 oz 134 lbs  BMI 21.36 kg/m2 21.52 kg/m2 20.99 kg/m2  Temp 97.9 F (36.6 C) 97.6 F (36.4 C) 97.8 F (36.6 C)  Pulse Rate 66 62 73  BP 113/59 102/55 116/63  Resp 16 18 14   SpO2 100 % 100 % 100 %  BSA (m2) 1.71 m2 1.72 m2 1.7 m2    Laboratory Data    Latest Ref Rng & Units 12/19/2022    9:41 AM 12/06/2022    9:46 AM 11/22/2022    8:30 AM  CBC EXTENDED  WBC 4.0 - 10.5 K/uL 2.5  2.7  2.7   RBC 3.87 - 5.11 MIL/uL 3.70  3.63  3.82   Hemoglobin 12.0 - 15.0 g/dL 53.6  64.4  03.4   HCT 36.0 - 46.0 % 34.5  33.9  35.5   Platelets 150 - 400 K/uL 287  202  246   NEUT# 1.7 - 7.7 K/uL 1.5  1.6  1.6   Lymph# 0.7 - 4.0 K/uL 0.6  0.7  0.7        Latest Ref Rng & Units 12/19/2022    9:41 AM 12/06/2022    9:46 AM 11/22/2022    8:30 AM  CMP  Glucose 70 - 99 mg/dL 84  79  67   BUN 6 - 20 mg/dL 17  17  16    Creatinine 0.44 - 1.00 mg/dL 1.91  4.78  2.95   Sodium 135 - 145 mmol/L 139  142  140   Potassium 3.5 - 5.1 mmol/L 4.1  4.2  3.8   Chloride 98 - 111 mmol/L 105  107  106   CO2 22 - 32 mmol/L 30  31  29    Calcium 8.9 - 10.3 mg/dL 9.7  9.5  9.2   Total Protein 6.5 - 8.1 g/dL 7.2  7.0  6.6   Total Bilirubin <1.2 mg/dL 0.4  0.3  0.3   Alkaline Phos 38 - 126 U/L 59  47  47   AST 15 - 41 U/L 40  18  20   ALT 0 - 44 U/L 41  14  15     Adverse Effects Assessment ANC: Stable and estimated at 1500 cells per microliter today.  Hold for grade 3 toxicities (<1000 cells/L) Hemoglobin: Slightly increased from last visit.  Estimated at 11.4 g/dL today.  Adherence Assessment Meline Swider Brining reports  missing 0 doses over the past 2 weeks.   Reason for missed dose: N/A Patient was re-educated on importance of adherence.   Access Assessment BRITANEE DESORBO is currently receiving her abemaciclib through [facility] specialty pharmacy Insurance concerns: None  Medication Reconciliation The patient's medication list was reviewed today with the patient?  Yes New medications or herbal supplements have recently been started?  No Any medications have been discontinued?  No The medication list was updated and reconciled based on the patient's most recent medication list in the electronic medical record (EMR) including herbal products and OTC medications.   Medications Current Outpatient Medications  Medication Sig Dispense Refill   ALPRAZolam (XANAX) 0.25 MG tablet Take 1 tablet (0.25 mg total) by mouth 2 (two) times daily as needed for anxiety. (Patient not taking: Reported on 11/08/2022) 30 tablet 0   anastrozole (ARIMIDEX) 1 MG tablet Take 1 tablet (1 mg total) by mouth daily. 90 tablet 3   GLUTATHIONE PO Take 250 mg by mouth daily. Usually takes twice a week (Monday, Thursday) (Patient not taking: Reported on 12/06/2022)     ivermectin (STROMECTOL) 3 MG TABS tablet Take 12 mg by mouth once. Twice a week (Monday, Thursday)     Magnesium Glycinate 120 MG CAPS Take 240 mg by mouth at bedtime.     Misc Natural Products (CORTISOL MANAGER) TABS Take 1 tablet by mouth daily.     MISC NATURAL PRODUCTS PO Take by mouth daily. Super Digestive Enzymes by [employer]     Multiple Vitamin (MULTIVITAMIN WITH MINERALS) TABS tablet Take 1 tablet by mouth daily. (Patient not taking: Reported on 12/06/2022)     Omega-3 Fatty Acids (FISH OIL PO) Take 2 capsules by mouth daily. 1200 mg Omega 3 (Patient not taking: Reported on 12/06/2022)     VERZENIO 100 MG tablet TAKE 1 TABLET BY MOUTH TWICE  DAILY 28 tablet 0   Vitamin D-Vitamin K (VITAMIN K2-VITAMIN D3 PO) Take by mouth daily. Vitamin D: 1.25 mcg / Vitamin K:  90 mcg     No current facility-administered medications for this visit.    Drug-Drug Interactions (DDIs) DDIs  were evaluated?  Yes Significant DDIs?  No, patient aware that with herbal supplements, very difficult to check interactions.  We have discussed this prior. The patient was instructed to speak with their health care provider and/or the oral chemotherapy pharmacist before starting any new drug, including prescription or over the counter, natural / herbal products, or vitamins.  Supportive Care Diarrhea: we reviewed that diarrhea is common with abemaciclib and confirmed that she does have loperamide (Imodium) at home.  We reviewed how to take this medication PRN. Neutropenia: we discussed the importance of having a thermometer and what the Centers for Disease Control and Prevention (CDC) considers a fever which is 100.45F (38C) or higher.  Gave patient 24/7 triage line to call if any fevers or symptoms. ILD/Pneumonitis: we reviewed potential symptoms including cough, shortness, and fatigue.  VTE: reviewed signs of DVT such as leg swelling, redness, pain, or tenderness and signs of PE such as shortness of breath, rapid or irregular heartbeat, cough, chest pain, or lightheadedness. Reviewed to take the medication every 12 hours (with food sometimes can be easier on the stomach) and to take it at the same time every day. Hepatotoxicity: WNL Drug interactions with grapefruit products We gave her information today on a large study that was conducted showing no risk with vaginal estrogens and increased risk for breast cancer She is also considering bilateral oophorectomy at a later date.  She will continue goserelin injections until that time.  Dosing Assessment Hepatic adjustments needed?  No Renal adjustments needed?  No Toxicity adjustments needed?  No The current dosing regimen is appropriate to continue at this time.  Follow-Up Plan Continue abemaciclib 100 mg by mouth every 12  hours Continue anastrozole 1 mg by mouth daily Continue goserelin 3.6 mg subcutaneously every 28 days. Next dose on 01/03/23, 01/31/23, 02/28/23, 03/28/23 Monitor WBC, Hgb, ANC Saw GYN, they will be doing a scan on 01/01/23. Pain is improving slightly. Per her report, they are thinking it may be endometriosis Labs, Dr. Pamelia Hoit, and goserelin injection on 01/03/23 already scheduled  Dr. Pamelia Hoit will likely have clinical pharmacy see her again in January when next due for goserelin (01/31/23).  Kaitlin [name] participated in the discussion, expressed understanding, and voiced agreement with the above plan. All questions were answered to her satisfaction. The patient was advised to contact the clinic at [phone] with any questions or concerns prior to her return visit.   I spent 30 minutes assessing and educating the patient.  Roya Gieselman A. Odetta Pink, PharmD, BCOP, CPP  Anselm Lis, RPH-CPP, 12/19/2022  10:49 AM   **Disclaimer: This note was dictated with voice recognition software. Similar sounding words can inadvertently be transcribed and this note may contain transcription errors which may not have been corrected upon publication of note.**

## 2022-12-25 ENCOUNTER — Other Ambulatory Visit: Payer: 59

## 2022-12-25 ENCOUNTER — Other Ambulatory Visit: Payer: Self-pay | Admitting: Hematology and Oncology

## 2022-12-25 ENCOUNTER — Ambulatory Visit: Payer: 59 | Admitting: Hematology and Oncology

## 2022-12-25 DIAGNOSIS — C50911 Malignant neoplasm of unspecified site of right female breast: Secondary | ICD-10-CM

## 2022-12-26 LAB — ESTRADIOL, ULTRA SENS: Estradiol, Sensitive: <2.5 pg/mL

## 2023-01-03 ENCOUNTER — Telehealth: Payer: Self-pay

## 2023-01-03 ENCOUNTER — Inpatient Hospital Stay: Payer: 59

## 2023-01-03 ENCOUNTER — Inpatient Hospital Stay: Payer: 59 | Attending: Hematology and Oncology

## 2023-01-03 ENCOUNTER — Inpatient Hospital Stay: Payer: 59 | Admitting: Hematology and Oncology

## 2023-01-03 VITALS — BP 130/92 | HR 84 | Temp 98.3°F | Resp 18 | Ht 67.0 in | Wt 135.2 lb

## 2023-01-03 DIAGNOSIS — C50411 Malignant neoplasm of upper-outer quadrant of right female breast: Secondary | ICD-10-CM

## 2023-01-03 DIAGNOSIS — C50911 Malignant neoplasm of unspecified site of right female breast: Secondary | ICD-10-CM

## 2023-01-03 DIAGNOSIS — K602 Anal fissure, unspecified: Secondary | ICD-10-CM | POA: Diagnosis not present

## 2023-01-03 DIAGNOSIS — K5900 Constipation, unspecified: Secondary | ICD-10-CM | POA: Insufficient documentation

## 2023-01-03 DIAGNOSIS — Z5111 Encounter for antineoplastic chemotherapy: Secondary | ICD-10-CM | POA: Diagnosis present

## 2023-01-03 DIAGNOSIS — K649 Unspecified hemorrhoids: Secondary | ICD-10-CM | POA: Diagnosis not present

## 2023-01-03 DIAGNOSIS — Z79899 Other long term (current) drug therapy: Secondary | ICD-10-CM | POA: Insufficient documentation

## 2023-01-03 DIAGNOSIS — Z880 Allergy status to penicillin: Secondary | ICD-10-CM | POA: Insufficient documentation

## 2023-01-03 DIAGNOSIS — Z881 Allergy status to other antibiotic agents status: Secondary | ICD-10-CM | POA: Insufficient documentation

## 2023-01-03 DIAGNOSIS — Z79811 Long term (current) use of aromatase inhibitors: Secondary | ICD-10-CM | POA: Diagnosis not present

## 2023-01-03 DIAGNOSIS — Z170 Estrogen receptor positive status [ER+]: Secondary | ICD-10-CM | POA: Diagnosis not present

## 2023-01-03 DIAGNOSIS — Z9013 Acquired absence of bilateral breasts and nipples: Secondary | ICD-10-CM | POA: Insufficient documentation

## 2023-01-03 DIAGNOSIS — R197 Diarrhea, unspecified: Secondary | ICD-10-CM | POA: Diagnosis not present

## 2023-01-03 DIAGNOSIS — D0512 Intraductal carcinoma in situ of left breast: Secondary | ICD-10-CM | POA: Insufficient documentation

## 2023-01-03 DIAGNOSIS — N951 Menopausal and female climacteric states: Secondary | ICD-10-CM | POA: Diagnosis not present

## 2023-01-03 DIAGNOSIS — C50912 Malignant neoplasm of unspecified site of left female breast: Secondary | ICD-10-CM

## 2023-01-03 DIAGNOSIS — R232 Flushing: Secondary | ICD-10-CM | POA: Diagnosis not present

## 2023-01-03 DIAGNOSIS — Z1721 Progesterone receptor positive status: Secondary | ICD-10-CM | POA: Diagnosis not present

## 2023-01-03 LAB — CMP (CANCER CENTER ONLY)
ALT: 30 U/L (ref 0–44)
AST: 28 U/L (ref 15–41)
Albumin: 4.5 g/dL (ref 3.5–5.0)
Alkaline Phosphatase: 57 U/L (ref 38–126)
Anion gap: 6 (ref 5–15)
BUN: 18 mg/dL (ref 6–20)
CO2: 29 mmol/L (ref 22–32)
Calcium: 9.6 mg/dL (ref 8.9–10.3)
Chloride: 102 mmol/L (ref 98–111)
Creatinine: 0.89 mg/dL (ref 0.44–1.00)
GFR, Estimated: >60 mL/min (ref >60)
Glucose, Bld: 130 mg/dL — ABNORMAL HIGH (ref 70–99)
Potassium: 4.2 mmol/L (ref 3.5–5.1)
Sodium: 137 mmol/L (ref 135–145)
Total Bilirubin: 0.3 mg/dL (ref <1.2)
Total Protein: 7.3 g/dL (ref 6.5–8.1)

## 2023-01-03 LAB — CBC WITH DIFFERENTIAL (CANCER CENTER ONLY)
Abs Immature Granulocytes: 0 10*3/uL (ref 0.00–0.07)
Basophils Absolute: 0.1 10*3/uL (ref 0.0–0.1)
Basophils Relative: 3 %
Eosinophils Absolute: 0 10*3/uL (ref 0.0–0.5)
Eosinophils Relative: 1 %
HCT: 33.5 % — ABNORMAL LOW (ref 36.0–46.0)
Hemoglobin: 11.4 g/dL — ABNORMAL LOW (ref 12.0–15.0)
Immature Granulocytes: 0 %
Lymphocytes Relative: 25 %
Lymphs Abs: 0.8 10*3/uL (ref 0.7–4.0)
MCH: 31.8 pg (ref 26.0–34.0)
MCHC: 34 g/dL (ref 30.0–36.0)
MCV: 93.3 fL (ref 80.0–100.0)
Monocytes Absolute: 0.3 10*3/uL (ref 0.1–1.0)
Monocytes Relative: 9 %
Neutro Abs: 1.9 10*3/uL (ref 1.7–7.7)
Neutrophils Relative %: 62 %
Platelet Count: 254 10*3/uL (ref 150–400)
RBC: 3.59 MIL/uL — ABNORMAL LOW (ref 3.87–5.11)
RDW: 17.2 % — ABNORMAL HIGH (ref 11.5–15.5)
WBC Count: 3 10*3/uL — ABNORMAL LOW (ref 4.0–10.5)
nRBC: 0 % (ref 0.0–0.2)

## 2023-01-03 MED ORDER — GOSERELIN ACETATE 3.6 MG ~~LOC~~ IMPL
3.6000 mg | DRUG_IMPLANT | Freq: Once | SUBCUTANEOUS | Status: AC
  Administered 2023-01-03: 3.6 mg via SUBCUTANEOUS
  Filled 2023-01-03: qty 3.6

## 2023-01-03 MED ORDER — LIDOCAINE-PRILOCAINE 2.5-2.5 % EX CREA: 1.0000 | TOPICAL_CREAM | CUTANEOUS | 0 refills | Status: AC | PRN

## 2023-01-03 NOTE — Telephone Encounter (Signed)
 Per md orders entered for [employer] and all supported documents faxed to [phone]. Faxed confirmation was received.

## 2023-01-03 NOTE — Progress Notes (Signed)
Patient Care Team: Merri Brunette, MD as PCP - General (Family Medicine) Serena Croissant, MD as Consulting Physician (Hematology and Oncology) Glenna Fellows, MD as Consulting Physician (Plastic Surgery) Dorothy Puffer, MD as Consulting Physician (Radiation Oncology) [name], MD as Consulting Physician (General Surgery) Anselm Lis, RPH-CPP as Pharmacist (Hematology and Oncology)  DIAGNOSIS:  Encounter Diagnosis  Name Primary?   Bilateral malignant neoplasm of breast in female, estrogen receptor positive, unspecified site of breast (HCC) Yes    SUMMARY OF ONCOLOGIC HISTORY: Oncology History  Bilateral breast cancer (HCC)  11/01/2020 Initial Diagnosis   Right breast biopsy UOQ 9:30 position: Grade 2 IDC with DCIS ER 100%, PR [place], Ki-67 2%, HER2 negative Right breast biopsy LIQ: Grade 1 IDC with DCIS Left breast biopsy UOQ: DCIS intermediate grade    11/01/2020 Cancer Staging   Staging form: Breast, AJCC 8th Edition - Clinical: Stage IIA (cT3, cN0, cM0, G2, ER+, PR+, HER2-) - Signed by Serena Croissant, MD on 11/01/2020 Stage prefix: Initial diagnosis Histologic grading system: 3 grade system   11/16/2020 Genetic Testing   Negative genetic testing on the CancerNext-Expanded+RNAinsight.  The report date is 11/16/2020  The CancerNext-Expanded gene panel offered by [facility] and includes sequencing and rearrangement analysis for the following 77 genes: AIP, ALK, APC*, ATM*, AXIN2, BAP1, BARD1, BLM, BMPR1A, BRCA1*, BRCA2*, BRIP1*, CDC73, CDH1*, CDK4, CDKN1B, CDKN2A, CHEK2*, CTNNA1, DICER1, FANCC, FH, FLCN, GALNT12, KIF1B, LZTR1, MAX, MEN1, MET, MLH1*, MSH2*, MSH3, MSH6*, MUTYH*, NBN, NF1*, NF2, NTHL1, PALB2*, PHOX2B, PMS2*, POT1, PRKAR1A, PTCH1, PTEN*, RAD51C*, RAD51D*, RB1, RECQL, RET, SDHA, SDHAF2, SDHB, SDHC, SDHD, SMAD4, SMARCA4, SMARCB1, SMARCE1, STK11, SUFU, TMEM127, TP53*, TSC1, TSC2, VHL and XRCC2 (sequencing and deletion/duplication); EGFR, EGLN1, HOXB13,  KIT, MITF, PDGFRA, POLD1, and POLE (sequencing only); EPCAM and GREM1 (deletion/duplication only). DNA and RNA analyses performed for * genes.    12/06/2020 Surgery   Bilateral Mastectomies:  Left Mastectomy:HG DCIS 2.2 cm with necrosis, Margins Neg ER 90%, PR 40% Right Mastectomy: Grade 2 IDC with DCIS 3.8 cm, 1/3 LN ER 100%, PR 100%, Her 2 Neg, Ki 67: 2-15%   12/13/2020 Miscellaneous   MammaPrint: Luminal type A, low risk   01/27/2021 - 03/14/2021 Radiation Therapy   Site Technique Total Dose (Gy) Dose per Fx (Gy) Completed Fx Beam Energies  Chest Wall, Right: CW_R 3D 50.4/50.4 1.8 28/28 6XFFF  Chest Wall, Right: CW_R_SCLV 3D 50.4/50.4 1.8 28/28 6X, 10X  Chest Wall, Right: CW_R_Bst Electron 10/10 2 5/5 6E     03/22/2021 -  Anti-estrogen oral therapy   Adjuvant tamoxifen   Ductal carcinoma in situ (DCIS) of left breast  01/03/2021 Initial Diagnosis   Ductal carcinoma in situ (DCIS) of left breast   06/21/2021 Cancer Staging   Staging form: Breast, AJCC 8th Edition - Clinical: Stage 0 (cTis (DCIS), cN0, cM0, ER+, PR+) - Signed by Loa Socks, NP on 06/21/2021     CHIEF COMPLIANT:   HISTORY OF PRESENT ILLNESS: Discussed the use of AI scribe software for clinical note transcription with the patient, who gave verbal consent to proceed.  History of Present Illness   The patient, on Verzenio (abemaciclib) for breast cancer and receiving Zoladex injections for estrogen suppression, reports occasional bouts of diarrhea. These episodes irritate existing hemorrhoids, causing anal pain. The patient has been dealing with these symptoms for approximately 14 years, following a difficult labor and delivery that resulted in damage to the pelvic floor and bowels. The patient also has a history of constipation, which alternates with the  diarrhea. The patient has had a colonoscopy approximately six to seven years ago.  The patient expresses concerns about the efficacy of her current  treatment and the possibility of recurrence. She is particularly worried about the potential for the cancer to infiltrate the matrix, a layer of donor skin used to wrap the implant after radiation treatment. The patient is considering a flat closure surgery to reduce the risk of recurrence.  The patient also discusses menopausal symptoms, including vaginal atrophy and hot flashes. She is using over-the-counter suppositories for the vaginal atrophy and is considering the use of vaginal estrogen. The patient also reports experiencing pain during Zoladex injections and is considering the use of a topical numbing cream.         ALLERGIES:  is allergic to ceclor [cefaclor], cephalosporins, rizatriptan, amoxicillin, and penicillins.  MEDICATIONS:  Current Outpatient Medications  Medication Sig Dispense Refill   lidocaine-prilocaine (EMLA) cream Apply 1 Application topically as needed. 30 g 0   anastrozole (ARIMIDEX) 1 MG tablet Take 1 tablet (1 mg total) by mouth daily. 90 tablet 3   ivermectin (STROMECTOL) 3 MG TABS tablet Take 12 mg by mouth once. Twice a week (Monday, Thursday)     Magnesium Glycinate 120 MG CAPS Take 240 mg by mouth at bedtime.     Misc Natural Products (CORTISOL MANAGER) TABS Take 1 tablet by mouth daily.     MISC NATURAL PRODUCTS PO Take by mouth daily. Super Digestive Enzymes by [employer]     VERZENIO 100 MG tablet TAKE 1 TABLET BY MOUTH TWICE  DAILY 28 tablet 0   Vitamin D-Vitamin K (VITAMIN K2-VITAMIN D3 PO) Take by mouth daily. Vitamin D: 1.25 mcg / Vitamin K: 90 mcg     No current facility-administered medications for this visit.    PHYSICAL EXAMINATION: ECOG PERFORMANCE STATUS: 1 - Symptomatic but completely ambulatory  Vitals:   01/03/23 1140  BP: (!) 130/92  Pulse: 84  Resp: 18  Temp: 98.3 F (36.8 C)  SpO2: 99%   Filed Weights   01/03/23 1140  Weight: 135 lb 3.2 oz (61.3 kg)    Physical Exam          (exam performed in the presence of a  chaperone)  LABORATORY DATA:  I have reviewed the data as listed    Latest Ref Rng & Units 01/03/2023   11:26 AM 12/19/2022    9:41 AM 12/06/2022    9:46 AM  CMP  Glucose 70 - 99 mg/dL 284  84  79   BUN 6 - 20 mg/dL 18  17  17    Creatinine 0.44 - 1.00 mg/dL 1.32  4.40  1.02   Sodium 135 - 145 mmol/L 137  139  142   Potassium 3.5 - 5.1 mmol/L 4.2  4.1  4.2   Chloride 98 - 111 mmol/L 102  105  107   CO2 22 - 32 mmol/L 29  30  31    Calcium 8.9 - 10.3 mg/dL 9.6  9.7  9.5   Total Protein 6.5 - 8.1 g/dL 7.3  7.2  7.0   Total Bilirubin <1.2 mg/dL 0.3  0.4  0.3   Alkaline Phos 38 - 126 U/L 57  59  47   AST 15 - 41 U/L 28  40  18   ALT 0 - 44 U/L 30  41  14     Lab Results  Component Value Date   WBC 3.0 (L) 01/03/2023   HGB 11.4 (L) 01/03/2023  HCT 33.5 (L) 01/03/2023   MCV 93.3 01/03/2023   PLT 254 01/03/2023   NEUTROABS 1.9 01/03/2023    ASSESSMENT & PLAN:  Bilateral breast cancer (HCC) 12/06/20: Bilateral Mastectomies:  Left Mastectomy:HG DCIS 2.2 cm with necrosis, Margins Neg ER 90%, PR 40% Right Mastectomy: Grade 2 IDC with DCIS 3.8 cm, 1/3 LN ER 100%, PR 100%, Her 2 Neg, Ki 67: 2-15%   Treatment Plan: 1. Mammaprint: Luminal type a low risk 2. Adjuvant XRT completed 03/14/2021 3. Adjuvant Anti-estrogen therapy with tamoxifen to start 03/22/2021-08/29/22 --------------------------------------------------------------------------------------------------------------------- Right breast palpable mass: Ultrasound: 6 mm, biopsy: Grade 2 IDC ER 95%, PR 95%, Ki67 30%, HER2 2+ by IHC FISH negative 09/25/2022: PET/CT: Benign   Treatment plan: Right lumpectomy: 09/26/2022: Recurrent IDC grade 2 measuring 0.8 cm, margins negative,ER 95%, PR 95%, Ki67 30%, HER2 2+ by IHC FISH negative Adjuvant ovarian function suppression with anastrozole.  Along with Verzinio started 11/09/2022   Surveillance: We would like to do guardant reveal     Abemaciclib toxicities:  Intermittent  diarrhea Constipation causing hemorrhoidal discomfort ------------------------------------- Assessment and Plan    Breast Cancer On Verzenio 100mg  twice daily with intermittent diarrhea as a side effect. Hematologic parameters are stable and improving. Discussed the possibility of increasing the dose, but decided against it due to potential exacerbation of GI symptoms. -Continue Verzenio 100mg  twice daily. -Plan to switch to Gardant testing for monitoring. -Consider PET scan next year for surveillance.  Anal Fissure and Hemorrhoids Exacerbated by alternating diarrhea and constipation, causing discomfort and occasional minor bleeding. Discussed the use of over-the-counter treatments and the potential need for surgical intervention if symptoms worsen. -Use Preparation H for symptomatic relief. -Consider sitz baths. -Notify physician if symptoms worsen significantly.  Menopausal Symptoms Experiencing hot flashes and vaginal atrophy. Discussed the use of topical estrogen for vaginal atrophy and the safety of this approach while on an aromatase inhibitor. -Consider use of topical estrogen for vaginal atrophy if symptoms worsen. -Continue Zoladex injections for estrogen suppression.  Follow-up Plans -Repeat labs in 3 months. -Consider prescription for topical numbing cream for Zoladex injections. -Plan for Gardant testing in August. -Consider PET scan next year.          No orders of the defined types were placed in this encounter.  The patient has a good understanding of the overall plan. she agrees with it. she will call with any problems that may develop before the next visit here. Total time spent: 30 mins including face to face time and time spent for planning, charting and co-ordination of care   Tamsen Meek, MD 01/03/23

## 2023-01-03 NOTE — Assessment & Plan Note (Signed)
12/06/20: Bilateral Mastectomies:  Left Mastectomy:HG DCIS 2.2 cm with necrosis, Margins Neg ER 90%, PR 40% Right Mastectomy: Grade 2 IDC with DCIS 3.8 cm, 1/3 LN ER 100%, PR 100%, Her 2 Neg, Ki 67: 2-15%   Treatment Plan: 1. Mammaprint: Luminal type a low risk 2. Adjuvant XRT completed 03/14/2021 3. Adjuvant Anti-estrogen therapy with tamoxifen to start 03/22/2021-08/29/22 --------------------------------------------------------------------------------------------------------------------- Right breast palpable mass: Ultrasound: 6 mm, biopsy: Grade 2 IDC ER 95%, PR 95%, Ki67 30%, HER2 2+ by IHC FISH negative 09/25/2022: PET/CT: Benign   Treatment plan: Right lumpectomy: 09/26/2022: Recurrent IDC grade 2 measuring 0.8 cm, margins negative,ER 95%, PR 95%, Ki67 30%, HER2 2+ by IHC FISH negative Adjuvant ovarian function suppression with anastrozole.  Along with Verzinio started 11/09/2022   Surveillance: She will continue with [employer] for surveillance along with that we will obtain PET CT scans annually.  She is extremely anxious and worried about recurrence detection.   Abemaciclib toxicities:    Alternatively we may be starting clinical trial called wider that she could participate in down the line.

## 2023-01-05 ENCOUNTER — Other Ambulatory Visit: Payer: Self-pay | Admitting: Hematology and Oncology

## 2023-01-05 DIAGNOSIS — C50911 Malignant neoplasm of unspecified site of right female breast: Secondary | ICD-10-CM

## 2023-01-07 ENCOUNTER — Encounter: Payer: Self-pay | Admitting: Hematology and Oncology

## 2023-01-08 LAB — ESTRADIOL, ULTRA SENS: Estradiol, Sensitive: <2.5 pg/mL

## 2023-01-11 ENCOUNTER — Ambulatory Visit
Admission: RE | Admit: 2023-01-11 | Discharge: 2023-01-11 | Disposition: A | Discharge status: Home / Self Care | Payer: 59 | Source: Ambulatory Visit | Attending: General Surgery | Admitting: General Surgery

## 2023-01-11 ENCOUNTER — Other Ambulatory Visit: Payer: Self-pay | Admitting: General Surgery

## 2023-01-11 DIAGNOSIS — N6320 Unspecified lump in the left breast, unspecified quadrant: Secondary | ICD-10-CM

## 2023-01-18 ENCOUNTER — Other Ambulatory Visit: Payer: Self-pay | Admitting: Radiology

## 2023-01-18 ENCOUNTER — Ambulatory Visit
Admission: RE | Admit: 2023-01-18 | Discharge: 2023-01-18 | Disposition: A | Discharge status: Home / Self Care | Payer: 59 | Source: Ambulatory Visit | Attending: Radiology | Admitting: Radiology

## 2023-01-18 ENCOUNTER — Ambulatory Visit
Admission: RE | Admit: 2023-01-18 | Discharge: 2023-01-18 | Disposition: A | Discharge status: Home / Self Care | Payer: 59 | Source: Ambulatory Visit | Attending: General Surgery | Admitting: General Surgery

## 2023-01-18 DIAGNOSIS — N6320 Unspecified lump in the left breast, unspecified quadrant: Secondary | ICD-10-CM

## 2023-01-18 DIAGNOSIS — R9389 Abnormal findings on diagnostic imaging of other specified body structures: Secondary | ICD-10-CM

## 2023-01-18 HISTORY — PX: BREAST BIOPSY: SHX20

## 2023-01-22 LAB — SURGICAL PATHOLOGY

## 2023-01-24 ENCOUNTER — Encounter: Payer: Self-pay | Admitting: Hematology and Oncology

## 2023-01-25 ENCOUNTER — Other Ambulatory Visit: Payer: Self-pay | Admitting: Pharmacist

## 2023-01-25 DIAGNOSIS — C50911 Malignant neoplasm of unspecified site of right female breast: Secondary | ICD-10-CM

## 2023-01-25 MED ORDER — ABEMACICLIB 100 MG PO TABS: 100.0000 mg | ORAL_TABLET | Freq: Two times a day (BID) | ORAL | 0 refills | Status: DC

## 2023-01-31 ENCOUNTER — Inpatient Hospital Stay: Payer: 59 | Attending: Hematology and Oncology

## 2023-01-31 VITALS — BP 136/78 | HR 82 | Temp 98.0°F | Resp 18

## 2023-01-31 DIAGNOSIS — Z5111 Encounter for antineoplastic chemotherapy: Secondary | ICD-10-CM | POA: Insufficient documentation

## 2023-01-31 DIAGNOSIS — Z79899 Other long term (current) drug therapy: Secondary | ICD-10-CM | POA: Diagnosis not present

## 2023-01-31 DIAGNOSIS — Z170 Estrogen receptor positive status [ER+]: Secondary | ICD-10-CM | POA: Diagnosis not present

## 2023-01-31 DIAGNOSIS — C50411 Malignant neoplasm of upper-outer quadrant of right female breast: Secondary | ICD-10-CM | POA: Diagnosis present

## 2023-01-31 MED ORDER — GOSERELIN ACETATE 3.6 MG ~~LOC~~ IMPL
3.6000 mg | DRUG_IMPLANT | Freq: Once | SUBCUTANEOUS | Status: AC
Start: 2023-01-31 — End: 2023-01-31
  Administered 2023-01-31: 3.6 mg via SUBCUTANEOUS
  Filled 2023-01-31: qty 3.6

## 2023-02-05 ENCOUNTER — Other Ambulatory Visit: Payer: Self-pay | Admitting: Hematology and Oncology

## 2023-02-05 ENCOUNTER — Other Ambulatory Visit: Payer: Self-pay | Admitting: Pharmacist

## 2023-02-05 DIAGNOSIS — C50911 Malignant neoplasm of unspecified site of right female breast: Secondary | ICD-10-CM

## 2023-02-05 NOTE — Telephone Encounter (Signed)
 Per [employer], sending with refills as patient leaving country and [employer] needs to do a vacation override.

## 2023-02-27 ENCOUNTER — Ambulatory Visit: Payer: 59 | Attending: Adult Health

## 2023-02-27 VITALS — Wt 137.1 lb

## 2023-02-27 DIAGNOSIS — Z483 Aftercare following surgery for neoplasm: Secondary | ICD-10-CM | POA: Insufficient documentation

## 2023-02-27 NOTE — Therapy (Signed)
 OUTPATIENT PHYSICAL THERAPY SOZO SCREENING NOTE   Patient Name: Kaitlin [name] MRN: [id number] DOB:1975-07-05, 48 y.o., female Today's Date: 02/27/2023  PCP: Claudene Pellet, MD REFERRING PROVIDER: Crawford Jacobsen Cornett*   PT End of Session - 02/27/23 1601     Visit Number 12   # unchanged due to screen only   PT Start Time 1558    PT Stop Time 1610    PT Time Calculation (min) 12 min    Activity Tolerance Patient tolerated treatment well    Behavior During Therapy [facility] for tasks assessed/performed             Past Medical History:  Diagnosis Date   Allergy    Anemia    Asthma    Cardiomyopathy (HCC)    Chronic headaches    Delivery normal 11/2008   Family history of breast cancer    Family history of colon cancer    Family history of leukemia    Family history of melanoma    Family history of ovarian cancer    Heart murmur    Kidney stone    x of   PONV (postoperative nausea and vomiting)    S/P tonsillectomy    2000   Past Surgical History:  Procedure Laterality Date   BREAST BIOPSY Right 09/12/2022   US  RT BREAST BX W LOC DEV 1ST LESION IMG BX SPEC US  GUIDE 09/12/2022 [facility]   BREAST BIOPSY Left 01/18/2023   US  LT BREAST BX W LOC DEV 1ST LESION IMG BX SPEC US  GUIDE 01/18/2023 [facility]MO[facility]   BREAST CYST EXCISION Right 09/26/2022   Procedure: RIGHT BREAST CANCER RECURRENT EXCISION;  Surgeon: Ebbie Cough, MD;  Location: [facility];  Service: General;  Laterality: Right;  LMA   BREAST RECONSTRUCTION WITH PLACEMENT OF TISSUE EXPANDER AND ALLODERM Bilateral 12/06/2020   Procedure: BREAST RECONSTRUCTION WITH PLACEMENT OF TISSUE EXPANDER AND ALLODERM;  Surgeon: Arelia Filippo, MD;  Location: [facility];  Service: Plastics;  Laterality: Bilateral;   LEEP     MASTECTOMY W/ SENTINEL NODE BIOPSY Right 12/06/2020   Procedure: RIGHT MASTECTOMY WITH RIGHT AXILLARY SENTINEL LYMPH NODE BIOPSY;  Surgeon: Ebbie Cough, MD;  Location:  [facility];  Service: General;  Laterality: Right;   REMOVAL OF BILATERAL TISSUE EXPANDERS WITH PLACEMENT OF BILATERAL BREAST IMPLANTS Bilateral 09/15/2021   Procedure: REMOVAL OF BILATERAL TISSUE EXPANDERS WITH PLACEMENT OF BILATERAL BREAST IMPLANTS;  Surgeon: Arelia Filippo, MD;  Location: [facility];  Service: Plastics;  Laterality: Bilateral;   tonsil removal  01/22/1998   TOTAL MASTECTOMY Left 12/06/2020   Procedure: LEFT TOTAL MASTECTOMY;  Surgeon: Ebbie Cough, MD;  Location: [facility];  Service: General;  Laterality: Left;   Patient Active Problem List   Diagnosis Date Noted   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   Malignant neoplasm of upper-outer quadrant of right breast in female, estrogen receptor positive (HCC) 12/12/2020   S/P bilateral mastectomy 12/06/2020   Genetic testing 11/17/2020   Family history of breast cancer 11/02/2020   Family history of ovarian cancer 11/02/2020   Family history of colon cancer 11/02/2020   Family history of melanoma 11/02/2020   Family history of leukemia 11/02/2020   Bilateral breast cancer (HCC) 11/01/2020   LLQ abdominal pain 04/06/2011    REFERRING DIAG: right breast cancer at risk for lymphedema  THERAPY DIAG: Aftercare following surgery for neoplasm  PERTINENT HISTORY: Bil mastectomy with immediate reconstruction 12/06/20 with 1/3  LN positive.  Rt Grade 2 IDC with DCIS ER positive.  Lt breast DCIS intermediate, no LN. Radiation 01/27/21-03/14/2021 to Right chest.  Implants 09/15/21   PRECAUTIONS: right UE Lymphedema risk, None  SUBJECTIVE: Pt returns for her 3 month L-Dex screen.   PAIN:  Are you having pain? No  SOZO SCREENING: Patient was assessed today using the SOZO machine to determine the lymphedema index score. This was compared to her baseline score. It was determined that she is within the recommended range when compared to her baseline and no further  action is needed at this time. She will continue SOZO screenings. These are done every 3 months for 2 years post operatively followed by every 6 months for 2 years, and then annually.  Answered pts questions within scope of practice about wearing her compression sleeve for her long upcoming flight to [place].     L-DEX FLOWSHEETS - 02/27/23 1600       L-DEX LYMPHEDEMA SCREENING   Measurement Type Unilateral    L-DEX MEASUREMENT EXTREMITY Upper Extremity    POSITION  Standing    DOMINANT SIDE Right    At Risk Side Right    BASELINE SCORE (UNILATERAL) -0.2    L-DEX SCORE (UNILATERAL) 0.3    VALUE CHANGE (UNILAT) 0.5                Aden Berwyn Caldron, PTA 02/27/2023, 4:15 PM

## 2023-02-28 ENCOUNTER — Inpatient Hospital Stay: Payer: 59 | Admitting: Hematology and Oncology

## 2023-02-28 ENCOUNTER — Inpatient Hospital Stay: Payer: 59

## 2023-02-28 ENCOUNTER — Encounter: Payer: Self-pay | Admitting: Hematology and Oncology

## 2023-02-28 ENCOUNTER — Inpatient Hospital Stay: Payer: 59 | Attending: Hematology and Oncology

## 2023-02-28 VITALS — BP 111/54 | HR 70 | Temp 97.8°F | Resp 18 | Ht 67.0 in | Wt 137.7 lb

## 2023-02-28 DIAGNOSIS — Z5111 Encounter for antineoplastic chemotherapy: Secondary | ICD-10-CM | POA: Diagnosis present

## 2023-02-28 DIAGNOSIS — C50912 Malignant neoplasm of unspecified site of left female breast: Secondary | ICD-10-CM | POA: Diagnosis not present

## 2023-02-28 DIAGNOSIS — Z79899 Other long term (current) drug therapy: Secondary | ICD-10-CM | POA: Insufficient documentation

## 2023-02-28 DIAGNOSIS — L905 Scar conditions and fibrosis of skin: Secondary | ICD-10-CM | POA: Diagnosis not present

## 2023-02-28 DIAGNOSIS — C50911 Malignant neoplasm of unspecified site of right female breast: Secondary | ICD-10-CM

## 2023-02-28 DIAGNOSIS — Z170 Estrogen receptor positive status [ER+]: Secondary | ICD-10-CM | POA: Insufficient documentation

## 2023-02-28 DIAGNOSIS — K649 Unspecified hemorrhoids: Secondary | ICD-10-CM | POA: Insufficient documentation

## 2023-02-28 DIAGNOSIS — D0512 Intraductal carcinoma in situ of left breast: Secondary | ICD-10-CM | POA: Diagnosis not present

## 2023-02-28 DIAGNOSIS — Z1721 Progesterone receptor positive status: Secondary | ICD-10-CM | POA: Insufficient documentation

## 2023-02-28 DIAGNOSIS — Z9013 Acquired absence of bilateral breasts and nipples: Secondary | ICD-10-CM | POA: Insufficient documentation

## 2023-02-28 DIAGNOSIS — K529 Noninfective gastroenteritis and colitis, unspecified: Secondary | ICD-10-CM | POA: Diagnosis not present

## 2023-02-28 DIAGNOSIS — C50411 Malignant neoplasm of upper-outer quadrant of right female breast: Secondary | ICD-10-CM | POA: Insufficient documentation

## 2023-02-28 DIAGNOSIS — Z79811 Long term (current) use of aromatase inhibitors: Secondary | ICD-10-CM | POA: Diagnosis not present

## 2023-02-28 DIAGNOSIS — Z880 Allergy status to penicillin: Secondary | ICD-10-CM | POA: Diagnosis not present

## 2023-02-28 DIAGNOSIS — Z881 Allergy status to other antibiotic agents status: Secondary | ICD-10-CM | POA: Diagnosis not present

## 2023-02-28 LAB — CBC WITH DIFFERENTIAL (CANCER CENTER ONLY)
Abs Immature Granulocytes: 0.01 10*3/uL (ref 0.00–0.07)
Basophils Absolute: 0.1 10*3/uL (ref 0.0–0.1)
Basophils Relative: 3 %
Eosinophils Absolute: 0 10*3/uL (ref 0.0–0.5)
Eosinophils Relative: 1 %
HCT: 32.7 % — ABNORMAL LOW (ref 36.0–46.0)
Hemoglobin: 11 g/dL — ABNORMAL LOW (ref 12.0–15.0)
Immature Granulocytes: 0 %
Lymphocytes Relative: 22 %
Lymphs Abs: 0.7 10*3/uL (ref 0.7–4.0)
MCH: 33.2 pg (ref 26.0–34.0)
MCHC: 33.6 g/dL (ref 30.0–36.0)
MCV: 98.8 fL (ref 80.0–100.0)
Monocytes Absolute: 0.3 10*3/uL (ref 0.1–1.0)
Monocytes Relative: 11 %
Neutro Abs: 1.9 10*3/uL (ref 1.7–7.7)
Neutrophils Relative %: 63 %
Platelet Count: 247 10*3/uL (ref 150–400)
RBC: 3.31 MIL/uL — ABNORMAL LOW (ref 3.87–5.11)
RDW: 15.3 % (ref 11.5–15.5)
WBC Count: 3 10*3/uL — ABNORMAL LOW (ref 4.0–10.5)
nRBC: 0 % (ref 0.0–0.2)

## 2023-02-28 LAB — CMP (CANCER CENTER ONLY)
ALT: 28 U/L (ref 0–44)
AST: 25 U/L (ref 15–41)
Albumin: 4.5 g/dL (ref 3.5–5.0)
Alkaline Phosphatase: 59 U/L (ref 38–126)
Anion gap: 3 — ABNORMAL LOW (ref 5–15)
BUN: 15 mg/dL (ref 6–20)
CO2: 30 mmol/L (ref 22–32)
Calcium: 9.3 mg/dL (ref 8.9–10.3)
Chloride: 105 mmol/L (ref 98–111)
Creatinine: 0.9 mg/dL (ref 0.44–1.00)
GFR, Estimated: >60 mL/min (ref >60)
Glucose, Bld: 109 mg/dL — ABNORMAL HIGH (ref 70–99)
Potassium: 4.4 mmol/L (ref 3.5–5.1)
Sodium: 138 mmol/L (ref 135–145)
Total Bilirubin: 0.4 mg/dL (ref 0.0–1.2)
Total Protein: 7.2 g/dL (ref 6.5–8.1)

## 2023-02-28 MED ORDER — GOSERELIN ACETATE 3.6 MG ~~LOC~~ IMPL
3.6000 mg | DRUG_IMPLANT | Freq: Once | SUBCUTANEOUS | Status: AC
  Administered 2023-02-28: 3.6 mg via SUBCUTANEOUS
  Filled 2023-02-28: qty 3.6

## 2023-02-28 NOTE — Progress Notes (Signed)
 Patient Care Team: Claudene Pellet, MD as PCP - General (Family Medicine) Odean Potts, MD as Consulting Physician (Hematology and Oncology) Arelia Filippo, MD as Consulting Physician (Plastic Surgery) Dewey Rush, MD as Consulting Physician (Radiation Oncology) Ebbie Cough, MD as Consulting Physician (General Surgery) Lucila Rush LABOR, RPH-CPP as Pharmacist (Hematology and Oncology)  DIAGNOSIS:  Encounter Diagnosis  Name Primary?   Bilateral malignant neoplasm of breast in female, estrogen receptor positive, unspecified site of breast (HCC) Yes    SUMMARY OF ONCOLOGIC HISTORY: Oncology History  Bilateral breast cancer (HCC)  11/01/2020 Initial Diagnosis   Right breast biopsy UOQ 9:30 position: Grade 2 IDC with DCIS ER 100%, PR [place], Ki-67 2%, HER2 negative Right breast biopsy LIQ: Grade 1 IDC with DCIS Left breast biopsy UOQ: DCIS intermediate grade    11/01/2020 Cancer Staging   Staging form: Breast, AJCC 8th Edition - Clinical: Stage IIA (cT3, cN0, cM0, G2, ER+, PR+, HER2-) - Signed by Odean Potts, MD on 11/01/2020 Stage prefix: Initial diagnosis Histologic grading system: 3 grade system   11/16/2020 Genetic Testing   Negative genetic testing on the CancerNext-Expanded+RNAinsight.  The report date is 11/16/2020  The CancerNext-Expanded gene panel offered by [facility] and includes sequencing and rearrangement analysis for the following 77 genes: AIP, ALK, APC*, ATM*, AXIN2, BAP1, BARD1, BLM, BMPR1A, BRCA1*, BRCA2*, BRIP1*, CDC73, CDH1*, CDK4, CDKN1B, CDKN2A, CHEK2*, CTNNA1, DICER1, FANCC, FH, FLCN, GALNT12, KIF1B, LZTR1, MAX, MEN1, MET, MLH1*, MSH2*, MSH3, MSH6*, MUTYH*, NBN, NF1*, NF2, NTHL1, PALB2*, PHOX2B, PMS2*, POT1, PRKAR1A, PTCH1, PTEN*, RAD51C*, RAD51D*, RB1, RECQL, RET, SDHA, SDHAF2, SDHB, SDHC, SDHD, SMAD4, SMARCA4, SMARCB1, SMARCE1, STK11, SUFU, TMEM127, TP53*, TSC1, TSC2, VHL and XRCC2 (sequencing and deletion/duplication); EGFR, EGLN1, HOXB13,  KIT, MITF, PDGFRA, POLD1, and POLE (sequencing only); EPCAM and GREM1 (deletion/duplication only). DNA and RNA analyses performed for * genes.    12/06/2020 Surgery   Bilateral Mastectomies:  Left Mastectomy:HG DCIS 2.2 cm with necrosis, Margins Neg ER 90%, PR 40% Right Mastectomy: Grade 2 IDC with DCIS 3.8 cm, 1/3 LN ER 100%, PR 100%, Her 2 Neg, Ki 67: 2-15%   12/13/2020 Miscellaneous   MammaPrint: Luminal type A, low risk   01/27/2021 - 03/14/2021 Radiation Therapy   Site Technique Total Dose (Gy) Dose per Fx (Gy) Completed Fx Beam Energies  Chest Wall, Right: CW_R 3D 50.4/50.4 1.8 28/28 6XFFF  Chest Wall, Right: CW_R_SCLV 3D 50.4/50.4 1.8 28/28 6X, 10X  Chest Wall, Right: CW_R_Bst Electron 10/10 2 5/5 6E     03/22/2021 -  Anti-estrogen oral therapy   Adjuvant tamoxifen    Ductal carcinoma in situ (DCIS) of left breast  01/03/2021 Initial Diagnosis   Ductal carcinoma in situ (DCIS) of left breast   06/21/2021 Cancer Staging   Staging form: Breast, AJCC 8th Edition - Clinical: Stage 0 (cTis (DCIS), cN0, cM0, ER+, PR+) - Signed by Crawford Morna Pickle, NP on 06/21/2021     CHIEF COMPLIANT: Follow-up on Verzinio  HISTORY OF PRESENT ILLNESS:  History of Present Illness   Kaitlin [name] is a 48 year old female who presents for follow-up on gastrointestinal symptoms and inflammation management.  She experiences intermittent diarrhea every other day, with three to four episodes on those days. Symptoms are influenced by dietary intake, particularly oils, leading her to adjust her diet to include only olive oil and incorporate raw foods like spinach into smoothies. The diarrhea is manageable but can cause spasms and irritation in the anal area, especially due to a past severe anal fissure. She uses  hemorrhoid cream, which provides some relief.  A recent biopsy of a three millimeter hard area in her left breast revealed benign but chronically inflamed adipose tissue. She is considering  dietary supplements to manage inflammation.  She has started working out but experiences soreness in the chest area, particularly around the sternum and scar tissue from previous surgeries, including a mastectomy and lumpectomy. The soreness occurs after workouts, especially when lifting weights, and she uses a sports bra for support. There is no pain during exercise, but tenderness is noted afterward.  She is preparing for a long flight to [place] and plans to maintain her medication schedule during the trip, taking her doses twice daily.         ALLERGIES:  is allergic to ceclor [cefaclor], cephalosporins, rizatriptan, amoxicillin, and penicillins.  MEDICATIONS:  Current Outpatient Medications  Medication Sig Dispense Refill   anastrozole  (ARIMIDEX ) 1 MG tablet Take 1 tablet (1 mg total) by mouth daily. 90 tablet 3   ivermectin (STROMECTOL) 3 MG TABS tablet Take 12 mg by mouth once. Twice a week (Monday, Thursday)     lidocaine -prilocaine  (EMLA ) cream Apply 1 Application topically as needed. 30 g 0   Magnesium Glycinate 120 MG CAPS Take 240 mg by mouth at bedtime.     Misc Natural Products (CORTISOL MANAGER) TABS Take 1 tablet by mouth daily.     MISC NATURAL PRODUCTS PO Take by mouth daily. Super Digestive Enzymes by [employer]     VERZENIO  100 MG tablet TAKE 1 TABLET BY MOUTH TWICE  DAILY 56 tablet 1   Vitamin D-Vitamin K (VITAMIN K2-VITAMIN D3 PO) Take by mouth daily. Vitamin D: 1.25 mcg / Vitamin K: 90 mcg     No current facility-administered medications for this visit.    PHYSICAL EXAMINATION: ECOG PERFORMANCE STATUS: 1 - Symptomatic but completely ambulatory  Vitals:   02/28/23 1127  BP: (!) 111/54  Pulse: 70  Resp: 18  Temp: 97.8 F (36.6 C)  SpO2: 100%   Filed Weights   02/28/23 1127  Weight: 137 lb 11.2 oz (62.5 kg)    Physical Exam          (exam performed in the presence of a chaperone)  LABORATORY DATA:  I have reviewed the data as listed     Latest Ref Rng & Units 02/28/2023   11:12 AM 01/03/2023   11:26 AM 12/19/2022    9:41 AM  CMP  Glucose 70 - 99 mg/dL 890  869  84   BUN 6 - 20 mg/dL 15  18  17    Creatinine 0.44 - 1.00 mg/dL 9.09  9.10  9.02   Sodium 135 - 145 mmol/L 138  137  139   Potassium 3.5 - 5.1 mmol/L 4.4  4.2  4.1   Chloride 98 - 111 mmol/L 105  102  105   CO2 22 - 32 mmol/L 30  29  30    Calcium 8.9 - 10.3 mg/dL 9.3  9.6  9.7   Total Protein 6.5 - 8.1 g/dL 7.2  7.3  7.2   Total Bilirubin 0.0 - 1.2 mg/dL 0.4  0.3  0.4   Alkaline Phos 38 - 126 U/L 59  57  59   AST 15 - 41 U/L 25  28  40   ALT 0 - 44 U/L 28  30  41     Lab Results  Component Value Date   WBC 3.0 (L) 02/28/2023   HGB 11.0 (L) 02/28/2023  HCT 32.7 (L) 02/28/2023   MCV 98.8 02/28/2023   PLT 247 02/28/2023   NEUTROABS 1.9 02/28/2023    ASSESSMENT & PLAN:  Bilateral breast cancer (HCC) 12/06/20: Bilateral Mastectomies:  Left Mastectomy:HG DCIS 2.2 cm with necrosis, Margins Neg ER 90%, PR 40% Right Mastectomy: Grade 2 IDC with DCIS 3.8 cm, 1/3 LN ER 100%, PR 100%, Her 2 Neg, Ki 67: 2-15%   Treatment Plan: 1. Mammaprint: Luminal type a low risk 2. Adjuvant XRT completed 03/14/2021 3. Adjuvant Anti-estrogen therapy with tamoxifen  to start 03/22/2021-08/29/22 4. Recurrence:  Right breast palpable mass: Ultrasound: 6 mm, biopsy: Grade 2 IDC ER 95%, PR 95%, Ki67 30%, HER2 2+ by IHC FISH negative 09/25/2022: PET/CT: Benign --------------------------------------------------------------------------------------------------------------------- Treatment plan: Right lumpectomy: 09/26/2022: Recurrent IDC grade 2 measuring 0.8 cm, margins negative,ER 95%, PR 95%, Ki67 30%, HER2 2+ by IHC FISH negative Adjuvant ovarian function suppression with anastrozole .  Along with Verzinio started 11/09/2022   Surveillance: We would like to do guardant reveal     Abemaciclib  toxicities:  Intermittent diarrhea Constipation causing hemorrhoidal discomfort  Hot flashes  and vaginal dryness  Return to clinic every 2 months for follow-ups ------------------------------------- Assessment and Plan    Post-Mastectomy Pain Persistent soreness in the sternum area, particularly on the right side, likely due to scar tissue and tension from previous mastectomy and lumpectomy. Discussed supportive measures and potential referral to physical therapy if pain persists. - Use sports bras for support - Moisturize and gently massage the chest area - Perform stretching exercises before and after workouts - Consider referral to physical therapy if pain persists  Chronic Inflammation of Adipose Tissue Biopsy of a 3mm hard area in the left breast revealed benign, chronically inflamed adipose tissue. Discussed alternative treatments, including modified citrus pectin and mistletoe therapy, noting the latter's controversial nature but acceptable use if it does not interact with other medications. - Approve use of modified citrus pectin - Approve trial of mistletoe therapy with caution regarding potential drug interactions  Chronic Diarrhea Intermittent diarrhea occurring every other day, with 3-4 episodes per day, influenced by dietary intake, particularly oils and raw foods. Managing symptoms with dietary modifications and hemorrhoid cream. Discussed additional relief measures and potential surgical consultation for anal fissures. - Continue current dietary modifications - Use hemorrhoid cream as needed - Apply Vaseline to prevent irritation - Consider sitz baths for relief - Consult with surgeons regarding potential treatments for anal fissures  General Health Maintenance Patient planning a long flight inquired about preventive measures. Discussed compression sleeves, stockings, and mask use. Emphasized the need for regular follow-ups and lab work. - Wear compression sleeve and stockings during the flight - Consider wearing a mask during the flight - Monthly injection  appointments - Bi-monthly lab work - Follow-up in four months with lab work  Follow-up - Maintain twice-daily medication schedule - Adjust medication timing based on local time zones - Return for monthly injection appointments.          Orders Placed This Encounter  Procedures   CBC with Differential (Cancer Center Only)    Standing Status:   Future    Expiration Date:   02/28/2024   CMP (Cancer Center only)    Standing Status:   Future    Expiration Date:   02/28/2024   The patient has a good understanding of the overall plan. she agrees with it. she will call with any problems that may develop before the next visit here. Total time spent: 30 mins including face to  face time and time spent for planning, charting and co-ordination of care   Viinay K Kikue Gerhart, MD 02/28/23

## 2023-02-28 NOTE — Assessment & Plan Note (Signed)
 12/06/20: Bilateral Mastectomies:  Left Mastectomy:HG DCIS 2.2 cm with necrosis, Margins Neg ER 90%, PR 40% Right Mastectomy: Grade 2 IDC with DCIS 3.8 cm, 1/3 LN ER 100%, PR 100%, Her 2 Neg, Ki 67: 2-15%   Treatment Plan: 1. Mammaprint: Luminal type a low risk 2. Adjuvant XRT completed 03/14/2021 3. Adjuvant Anti-estrogen therapy with tamoxifen  to start 03/22/2021-08/29/22 4. Recurrence:  Right breast palpable mass: Ultrasound: 6 mm, biopsy: Grade 2 IDC ER 95%, PR 95%, Ki67 30%, HER2 2+ by IHC FISH negative 09/25/2022: PET/CT: Benign --------------------------------------------------------------------------------------------------------------------- Treatment plan: Right lumpectomy: 09/26/2022: Recurrent IDC grade 2 measuring 0.8 cm, margins negative,ER 95%, PR 95%, Ki67 30%, HER2 2+ by IHC FISH negative Adjuvant ovarian function suppression with anastrozole .  Along with Verzinio started 11/09/2022   Surveillance: We would like to do guardant reveal     Abemaciclib  toxicities:  Intermittent diarrhea Constipation causing hemorrhoidal discomfort  Hot flashes and vaginal dryness  Return to clinic every 2 months for follow-ups

## 2023-03-01 ENCOUNTER — Telehealth: Payer: Self-pay | Admitting: Hematology and Oncology

## 2023-03-01 NOTE — Telephone Encounter (Signed)
 Scheduled appointments per 2/6 los. Patient is aware of the made appointments.

## 2023-03-05 LAB — ESTRADIOL, ULTRA SENS: Estradiol, Sensitive: <2.5 pg/mL

## 2023-03-19 ENCOUNTER — Ambulatory Visit: Payer: 59 | Admitting: Rehabilitation

## 2023-03-22 ENCOUNTER — Encounter: Payer: Self-pay | Admitting: Hematology and Oncology

## 2023-03-28 ENCOUNTER — Inpatient Hospital Stay: Payer: 59 | Attending: Hematology and Oncology

## 2023-03-28 VITALS — BP 107/60 | HR 77 | Temp 98.6°F | Resp 16

## 2023-03-28 DIAGNOSIS — C50411 Malignant neoplasm of upper-outer quadrant of right female breast: Secondary | ICD-10-CM | POA: Diagnosis present

## 2023-03-28 DIAGNOSIS — Z79899 Other long term (current) drug therapy: Secondary | ICD-10-CM | POA: Diagnosis not present

## 2023-03-28 DIAGNOSIS — D0512 Intraductal carcinoma in situ of left breast: Secondary | ICD-10-CM | POA: Diagnosis not present

## 2023-03-28 DIAGNOSIS — Z170 Estrogen receptor positive status [ER+]: Secondary | ICD-10-CM | POA: Insufficient documentation

## 2023-03-28 DIAGNOSIS — R232 Flushing: Secondary | ICD-10-CM | POA: Diagnosis not present

## 2023-03-28 DIAGNOSIS — R197 Diarrhea, unspecified: Secondary | ICD-10-CM | POA: Insufficient documentation

## 2023-03-28 DIAGNOSIS — Z5111 Encounter for antineoplastic chemotherapy: Secondary | ICD-10-CM | POA: Diagnosis present

## 2023-03-28 MED ORDER — GOSERELIN ACETATE 3.6 MG ~~LOC~~ IMPL
3.6000 mg | DRUG_IMPLANT | Freq: Once | SUBCUTANEOUS | Status: AC
  Administered 2023-03-28: 3.6 mg via SUBCUTANEOUS
  Filled 2023-03-28: qty 3.6

## 2023-03-30 ENCOUNTER — Other Ambulatory Visit: Payer: Self-pay | Admitting: Hematology and Oncology

## 2023-03-30 DIAGNOSIS — C50911 Malignant neoplasm of unspecified site of right female breast: Secondary | ICD-10-CM

## 2023-04-09 ENCOUNTER — Inpatient Hospital Stay: Admitting: Pharmacist

## 2023-04-09 ENCOUNTER — Encounter: Payer: Self-pay | Admitting: Hematology and Oncology

## 2023-04-09 DIAGNOSIS — Z170 Estrogen receptor positive status [ER+]: Secondary | ICD-10-CM

## 2023-04-09 NOTE — Progress Notes (Signed)
 [name]       Telephone: [phone]?Fax: [phone]   Oncology Clinical Pharmacist Practitioner Progress Note  Kaitlin [name] is a 48 y.o. female with a diagnosis of breast cancer currently on abemaciclib (started 11/09/22), anastrozole (started 10/12/22), and goserelin (started 10/11/22) under the care of Dr. Serena Croissant.   I connected with Kaitlin [name] today by telephone and verified that I was speaking with the correct person using two patient identifiers. I discussed the limitations, risks, security and privacy concerns of performing an evaluation and management service by telemedicine and the availability of in-person appointments. The patient/caregiver expressed understanding and agreed to proceed.  Other persons participating in the visit and their role in the encounter: N/A   Patient's location: home  Provider's location: clinic  Kaitlin [name] was contacted today after responding to a patient message about loose stool. She describes having loose stool the past 3 days (Saturday, Sunday, Monday). She has a history of hemorrhoids and anal fissures. The fissure healed two weeks ago but this weekend she did have one episode of blood while having a bowel movement. She is able to drink fluids and having normal urine output per her report. She did take 1 tablet of loperamide (Imodium) last night and so far today has not had loose stool. She has had constipation in the past when taking loperamide and tries to limit how much she takes of it.   We discussed that most patients will see abemaciclib GI toxicities dissipate after a few months and she has been on abemaciclib since 11/09/22. She was tolerating it prior to this recent episode with just sporadic loose stool which she felt to be more due to her diet triggers (lettuce, etc.). She does follow the raw diet. We discussed holding abemaciclib for now to see if symptoms improve but she would prefer to wait to see if the  loperamide helps first. We discussed that if the diarrhea should continue, then taking loperamide PRN would be reasonable. If the diarrhea persists, becomes bloody, or she becomes febrile, then we would recommend her being further evaluated for infection workup through our symptom management clinic. While the loose stool being due to abemaciclib would be less likely, she could also consider lowering the dose of abemaciclib to 50 mg PO BID to see if this may help also. She will consider if symptoms do not improve.   Clinical pharmacy sees her again on 04/24/23 with labs when she is next due for goserelin. She last saw Dr. Pamelia Hoit on 02/28/23  Kaitlin [name] participated in the discussion, expressed understanding, and voiced agreement with the above plan. All questions were answered to her satisfaction. The patient was advised to contact the clinic at [phone] with any questions or concerns prior to her return visit.  Clinical pharmacy will continue to support Kaitlin [name] and Dr. Serena Croissant as needed.  Kaitlin [name] A. Odetta [name], PharmD, BCOP, CPP  Kaitlin [name], RPH-CPP,  04/09/2023  1:30 PM   **Disclaimer: This note was dictated with voice recognition software. Similar sounding words can inadvertently be transcribed and this note may contain transcription errors which may not have been corrected upon publication of note.**

## 2023-04-12 ENCOUNTER — Telehealth: Payer: Self-pay | Admitting: *Deleted

## 2023-04-12 NOTE — Telephone Encounter (Signed)
 Received call from pt with complaint of ongoing diarrhea and abdominal cramping.  Pt states she is currently still taking Verzenio but has also recently taken a trip to [place] and was involved with feeding some of the wildlife there.  MD out of office, RN reviewed with [facility] provider as well as Jonny Ruiz, PharmD who has recommenced pt hold Verzenio and f/u with urgent care for possible stool sample.  RN contact pt with information.  Pt states she will hold Verzenio at this time and will seek help at Urgent Care if symptoms do not resolve or become worse. Jonny Ruiz, PharmD states he will call pt  on Monday to f/u.

## 2023-04-15 ENCOUNTER — Telehealth: Payer: Self-pay | Admitting: Pharmacist

## 2023-04-15 NOTE — Telephone Encounter (Signed)
 [facility]       Telephone: [phone]?Fax: [phone]   Oncology Clinical Pharmacist Practitioner Progress Note  Kaitlin [name] is a 48 y.o. female with a diagnosis of breast cancer currently on abemaciclib in the adjuvant setting under the care of Dr. Serena Croissant.   I connected with Jens Som today by telephone and verified that I was speaking with the correct person using two patient identifiers. I discussed the limitations, risks, security and privacy concerns of performing an evaluation and management service by telemedicine and the availability of in-person appointments. The patient/caregiver expressed understanding and agreed to proceed.  Other persons participating in the visit and their role in the encounter: none   Patient's location: home  Provider's location: clinic  Contacted patient back today to inquire about the loose stools she was continuing to have on Friday. We did recommend holding abemaciclib at that time and today she is feeling much better and has had a few episodes of formed stool. She is still having some "pulling" on the left side of her stomach but overall her symptoms are much better. She will see her PCP on 04/18/23 and will have a stool sample likely taken to r/o any infectious source she was was in [place] and may have had an exposure. She will see clinical pharmacy again on 04/24/23 and will hold abemaciclib until that time. We gave the option of restarting abemaciclib at the same dose, 100 mg, every 12 hours with close monitoring or going down to 50 mg every 12 hours at that time. She will consider both option and decide at her next visit. She will also have labs at that time. She also made a GI appt for 05/30/23.  Jens Som participated in the discussion, expressed understanding, and voiced agreement with the above plan. All questions were answered to her satisfaction. The patient was advised to contact the clinic at [phone]  [phone] with any questions or concerns prior to her return visit.  Clinical pharmacy will continue to support Jens Som and Dr. Serena Croissant as needed.  Kiante Ciavarella A. Odetta Pink, PharmD, BCOP, CPP  Anselm Lis, RPH-CPP,  04/15/2023  3:01 PM   **Disclaimer: This note was dictated with voice recognition software. Similar sounding words can inadvertently be transcribed and this note may contain transcription errors which may not have been corrected upon publication of note.**

## 2023-04-16 ENCOUNTER — Other Ambulatory Visit: Payer: Self-pay | Admitting: *Deleted

## 2023-04-16 ENCOUNTER — Encounter: Payer: Self-pay | Admitting: Hematology and Oncology

## 2023-04-16 DIAGNOSIS — Z780 Asymptomatic menopausal state: Secondary | ICD-10-CM

## 2023-04-23 NOTE — Progress Notes (Unsigned)
 [facility]       Telephone: [phone]?Fax: [phone]   Oncology Clinical Pharmacist Practitioner Progress Note  Kaitlin [name] was contacted via in-person to discuss her chemotherapy regimen for abemaciclib which they receive under the care of Dr. Serena Croissant.   Current treatment regimen and start date Abemaciclib (11/09/22) Anastrozole (10/12/22) Goserelin (10/11/22)   Interval History She continues to HOLD abemaciclib 100 mg by mouth every 12 hours on days 1 to 28 of a 28-day cycle. This is being given in combination with anastrozole and goserelin . Therapy is planned to continue until two years in the adjuvant setting per the [facility] trial data. She last saw Dr. Pamelia Hoit on 02/28/23 and had a phone conversation with clinical pharmacy saw her last on 04/15/23. At that time, she was holding abemaciclib due to diarrhea and had some immediate relief when holding for a few days. We discussed continuing to hold the medication until she saw [place] today.   Her GYN appointment went well. They will be imaging her on 01/01/23 but believe it may be endometriosis. She states the pain is subsided some. She sees Dr. Pamelia Hoit next with labs on 01/03/23 when she is next due for every 4 week goserelin.   Response to Therapy She continues to hold abemaciclib and continues to have formed stool. We discussed restarting 100 mg BID or going down to 50 mg BID. We have discontinued the 100 mg BID dose and sent a new 50 mg BID dose to [facility]. She can start the reduced dose when she receives it. If she has GI symptoms again, then we will likely discontinue and possibly try ribociclib. She will see Dr. Pamelia Hoit with labs in 4 weeks, when next due for goserelin. She will receive goserelin today and 1L of IVF over an hour. Patient serum creatinine was slightly increased and patient requested IVF.  Labs, vitals, treatment parameters, and manufacturer guidelines assessing toxicity were  reviewed with Kaitlin [name] today. Based on these values, patient is in agreement to continue abemaciclib therapy at this time once receiving new dose.  Allergies Allergies  Allergen Reactions   Ceclor [Cefaclor] Hives and Shortness Of Breath   Cephalosporins Anaphylaxis and Hives   Rizatriptan Anaphylaxis   Amoxicillin Hives and Rash   Penicillins Other (See Comments)    Unknown reaction     Vitals    04/24/2023   10:01 AM 03/28/2023    1:20 PM 02/28/2023   11:27 AM  Oncology Vitals  Height 170 cm  170 cm  Weight 61.145 kg  62.46 kg  Weight (lbs) 134 lbs 13 oz  137 lbs 11 oz  BMI 21.11 kg/m2  21.57 kg/m2  Temp 97.8 F (36.6 C) 98.6 F (37 C) 97.8 F (36.6 C)  Pulse Rate 69 77 70  BP 110/66 107/60 111/54  Resp 17 16 18   SpO2 100 % 100 % 100 %  BSA (m2) 1.7 m2  1.72 m2    Laboratory Data    Latest Ref Rng & Units 04/24/2023    9:25 AM 02/28/2023   11:12 AM 01/03/2023   11:26 AM  CBC EXTENDED  WBC 4.0 - 10.5 K/uL 3.0  3.0  3.0   RBC 3.87 - 5.11 MIL/uL 3.71  3.31  3.59   Hemoglobin 12.0 - 15.0 g/dL 16.6  06.3  01.6   HCT 36.0 - 46.0 % 37.3  32.7  33.5   Platelets 150 - 400 K/uL 263  247  254   NEUT# 1.7 - 7.7 K/uL 1.6  1.9  1.9   Lymph# 0.7 - 4.0 K/uL 0.7  0.7  0.8        Latest Ref Rng & Units 04/24/2023    9:25 AM 02/28/2023   11:12 AM 01/03/2023   11:26 AM  CMP  Glucose 70 - 99 mg/dL 73  161  096   BUN 6 - 20 mg/dL 14  15  18    Creatinine 0.44 - 1.00 mg/dL 0.45  4.09  8.11   Sodium 135 - 145 mmol/L 140  138  137   Potassium 3.5 - 5.1 mmol/L 4.2  4.4  4.2   Chloride 98 - 111 mmol/L 105  105  102   CO2 22 - 32 mmol/L 31  30  29    Calcium 8.9 - 10.3 mg/dL 9.8  9.3  9.6   Total Protein 6.5 - 8.1 g/dL 7.5  7.2  7.3   Total Bilirubin 0.0 - 1.2 mg/dL 0.4  0.4  0.3   Alkaline Phos 38 - 126 U/L 66  59  57   AST 15 - 41 U/L 33  25  28   ALT 0 - 44 U/L 29  28  30      Adverse Effects Assessment ANC: 1600 today. Monitor. Has been off abemaciclib for 1.5 weeks Serum  creatinine: just above ULN. Likely due to all the loose stool she had prior. Will give 1L NS IVF over an hour today.  Adherence Assessment Kaitlin [name] reports missing 0 doses over the past 2 weeks due to adherence. She held for 1.5 weeks due to loose stool as above.  Reason for missed dose: N/A Patient was re-educated on importance of adherence.   Access Assessment [name] is currently receiving her abemaciclib through [facility] specialty pharmacy Insurance concerns: None  Medication Reconciliation The patient's medication list was reviewed today with the patient?  Yes New medications or herbal supplements have recently been started?  No Any medications have been discontinued?  No The medication list was updated and reconciled based on the patient's most recent medication list in the electronic medical record (EMR) including herbal products and OTC medications.   Medications Current Outpatient Medications  Medication Sig Dispense Refill   abemaciclib (VERZENIO) 50 MG tablet Take 1 tablet (50 mg total) by mouth 2 (two) times daily. Swallow tablets whole. Do not chew, crush, or split tablets before swallowing. 56 tablet 5   anastrozole (ARIMIDEX) 1 MG tablet Take 1 tablet (1 mg total) by mouth daily. 90 tablet 3   ivermectin (STROMECTOL) 3 MG TABS tablet Take 12 mg by mouth once. Twice a week (Monday, Thursday)     lidocaine-prilocaine (EMLA) cream Apply 1 Application topically as needed. 30 g 0   Magnesium Glycinate 120 MG CAPS Take 240 mg by mouth at bedtime.     Misc Natural Products (CORTISOL MANAGER) TABS Take 1 tablet by mouth daily.     MISC NATURAL PRODUCTS PO Take by mouth daily. Super Digestive Enzymes by [employer]     Vitamin D-Vitamin K (VITAMIN K2-VITAMIN D3 PO) Take by mouth daily. Vitamin D: 1.25 mcg / Vitamin K: 90 mcg     No current facility-administered medications for this visit.   Facility-Administered Medications Ordered in Other Visits  Medication  Dose Route Frequency Provider Last Rate Last Admin   goserelin (ZOLADEX) injection 3.6 mg  3.6 mg Subcutaneous Once Serena Croissant, MD  Drug-Drug Interactions (DDIs) DDIs were evaluated?  Yes Significant DDIs?  No, patient aware that with herbal supplements, very difficult to check interactions.  We have discussed this prior. The patient was instructed to speak with their health care provider and/or the oral chemotherapy pharmacist before starting any new drug, including prescription or over the counter, natural / herbal products, or vitamins.  Supportive Care Diarrhea: we reviewed that diarrhea is common with abemaciclib and confirmed that she does have loperamide (Imodium) at home.  We reviewed how to take this medication PRN. Neutropenia: we discussed the importance of having a thermometer and what the Centers for Disease Control and Prevention (CDC) considers a fever which is 100.45F (38C) or higher.  Gave patient 24/7 triage line to call if any fevers or symptoms. ILD/Pneumonitis: we reviewed potential symptoms including cough, shortness, and fatigue.  VTE: reviewed signs of DVT such as leg swelling, redness, pain, or tenderness and signs of PE such as shortness of breath, rapid or irregular heartbeat, cough, chest pain, or lightheadedness. Reviewed to take the medication every 12 hours (with food sometimes can be easier on the stomach) and to take it at the same time every day. Hepatotoxicity: WNL Drug interactions with grapefruit products She is also considering bilateral oophorectomy at a later date.  She will continue goserelin injections until that time.  Dosing Assessment Hepatic adjustments needed?  No Renal adjustments needed?  No Toxicity adjustments needed?  No The current dosing regimen is appropriate to stop and will reduce dose to 50 mg BID.  Follow-Up Plan STOP abemaciclib 100 mg by mouth every 12 hours START abemaciclib 50 mg by mouth every 12 hours once received  from [facility]. Rx sent today Continue anastrozole 1 mg by mouth daily Continue goserelin 3.6 mg subcutaneously every 28 days. Due today and again on 05/22/23 Monitor ANC, SCr Saw PCP last week and will see GI on 05/30/23 Labs, Dr. Pamelia Hoit visit, and goserelin 05/22/23 Kaitlin [name] can follow up with clinical pharmacy as deemed necessary by Dr. Serena Croissant going forward   Kaitlin [name] participated in the discussion, expressed understanding, and voiced agreement with the above plan. All questions were answered to her satisfaction. The patient was advised to contact the clinic at [phone] with any questions or concerns prior to her return visit.   I spent 30 minutes assessing and educating the patient.  A[place]urn Hert A. Odetta Pink, PharmD, BCOP, CPP  Anselm Lis, RPH-CPP, 04/24/2023  10:31 AM   **Disclaimer: This note was dictated with voice recognition software. Similar sounding words can inadvertently be transcribed and this note may contain transcription errors which may not have been corrected upon publication of note.**

## 2023-04-24 ENCOUNTER — Other Ambulatory Visit: Payer: Self-pay

## 2023-04-24 ENCOUNTER — Inpatient Hospital Stay: Payer: 59 | Admitting: Pharmacist

## 2023-04-24 ENCOUNTER — Inpatient Hospital Stay: Payer: 59 | Attending: Hematology and Oncology

## 2023-04-24 ENCOUNTER — Inpatient Hospital Stay: Payer: 59

## 2023-04-24 ENCOUNTER — Inpatient Hospital Stay

## 2023-04-24 VITALS — BP 110/66 | HR 69 | Temp 97.8°F | Resp 17 | Ht 67.0 in | Wt 134.8 lb

## 2023-04-24 DIAGNOSIS — Z881 Allergy status to other antibiotic agents status: Secondary | ICD-10-CM | POA: Insufficient documentation

## 2023-04-24 DIAGNOSIS — C50911 Malignant neoplasm of unspecified site of right female breast: Secondary | ICD-10-CM

## 2023-04-24 DIAGNOSIS — Z1721 Progesterone receptor positive status: Secondary | ICD-10-CM | POA: Insufficient documentation

## 2023-04-24 DIAGNOSIS — Z5111 Encounter for antineoplastic chemotherapy: Secondary | ICD-10-CM | POA: Insufficient documentation

## 2023-04-24 DIAGNOSIS — Z170 Estrogen receptor positive status [ER+]: Secondary | ICD-10-CM

## 2023-04-24 DIAGNOSIS — Z1732 Human epidermal growth factor receptor 2 negative status: Secondary | ICD-10-CM | POA: Diagnosis not present

## 2023-04-24 DIAGNOSIS — Z880 Allergy status to penicillin: Secondary | ICD-10-CM | POA: Diagnosis not present

## 2023-04-24 DIAGNOSIS — K649 Unspecified hemorrhoids: Secondary | ICD-10-CM | POA: Insufficient documentation

## 2023-04-24 DIAGNOSIS — K529 Noninfective gastroenteritis and colitis, unspecified: Secondary | ICD-10-CM | POA: Insufficient documentation

## 2023-04-24 DIAGNOSIS — Z9013 Acquired absence of bilateral breasts and nipples: Secondary | ICD-10-CM | POA: Insufficient documentation

## 2023-04-24 DIAGNOSIS — Z79899 Other long term (current) drug therapy: Secondary | ICD-10-CM | POA: Insufficient documentation

## 2023-04-24 DIAGNOSIS — D0512 Intraductal carcinoma in situ of left breast: Secondary | ICD-10-CM | POA: Insufficient documentation

## 2023-04-24 DIAGNOSIS — C50411 Malignant neoplasm of upper-outer quadrant of right female breast: Secondary | ICD-10-CM | POA: Diagnosis present

## 2023-04-24 DIAGNOSIS — Z79811 Long term (current) use of aromatase inhibitors: Secondary | ICD-10-CM | POA: Diagnosis not present

## 2023-04-24 LAB — CBC WITH DIFFERENTIAL (CANCER CENTER ONLY)
Abs Immature Granulocytes: 0.01 10*3/uL (ref 0.00–0.07)
Basophils Absolute: 0.1 10*3/uL (ref 0.0–0.1)
Basophils Relative: 4 %
Eosinophils Absolute: 0.1 10*3/uL (ref 0.0–0.5)
Eosinophils Relative: 2 %
HCT: 37.3 % (ref 36.0–46.0)
Hemoglobin: 12.5 g/dL (ref 12.0–15.0)
Immature Granulocytes: 0 %
Lymphocytes Relative: 25 %
Lymphs Abs: 0.7 10*3/uL (ref 0.7–4.0)
MCH: 33.7 pg (ref 26.0–34.0)
MCHC: 33.5 g/dL (ref 30.0–36.0)
MCV: 100.5 fL — ABNORMAL HIGH (ref 80.0–100.0)
Monocytes Absolute: 0.4 10*3/uL (ref 0.1–1.0)
Monocytes Relative: 15 %
Neutro Abs: 1.6 10*3/uL — ABNORMAL LOW (ref 1.7–7.7)
Neutrophils Relative %: 54 %
Platelet Count: 263 10*3/uL (ref 150–400)
RBC: 3.71 MIL/uL — ABNORMAL LOW (ref 3.87–5.11)
RDW: 12.7 % (ref 11.5–15.5)
WBC Count: 3 10*3/uL — ABNORMAL LOW (ref 4.0–10.5)
nRBC: 0 % (ref 0.0–0.2)

## 2023-04-24 LAB — CMP (CANCER CENTER ONLY)
ALT: 29 U/L (ref 0–44)
AST: 33 U/L (ref 15–41)
Albumin: 4.6 g/dL (ref 3.5–5.0)
Alkaline Phosphatase: 66 U/L (ref 38–126)
Anion gap: 4 — ABNORMAL LOW (ref 5–15)
BUN: 14 mg/dL (ref 6–20)
CO2: 31 mmol/L (ref 22–32)
Calcium: 9.8 mg/dL (ref 8.9–10.3)
Chloride: 105 mmol/L (ref 98–111)
Creatinine: 1.01 mg/dL — ABNORMAL HIGH (ref 0.44–1.00)
GFR, Estimated: >60 mL/min (ref >60)
Glucose, Bld: 73 mg/dL (ref 70–99)
Potassium: 4.2 mmol/L (ref 3.5–5.1)
Sodium: 140 mmol/L (ref 135–145)
Total Bilirubin: 0.4 mg/dL (ref 0.0–1.2)
Total Protein: 7.5 g/dL (ref 6.5–8.1)

## 2023-04-24 MED ORDER — ABEMACICLIB 50 MG PO TABS: 50.0000 mg | ORAL_TABLET | Freq: Two times a day (BID) | ORAL | 5 refills | Status: DC

## 2023-04-24 MED ORDER — SODIUM CHLORIDE 0.9 % IV SOLN: Freq: Once | INTRAVENOUS | Status: AC

## 2023-04-24 MED ORDER — GOSERELIN ACETATE 3.6 MG ~~LOC~~ IMPL
3.6000 mg | DRUG_IMPLANT | Freq: Once | SUBCUTANEOUS | Status: AC
  Administered 2023-04-24: 3.6 mg via SUBCUTANEOUS
  Filled 2023-04-24: qty 3.6

## 2023-04-24 NOTE — Patient Instructions (Signed)
 Goserelin Implant (received in left lower abdomen on 04/24/23) What is this medication? GOSERELIN (GOE se rel in) treats prostate cancer and breast cancer. It works by decreasing levels of the hormones testosterone and estrogen in the body. This prevents prostate and breast cancer cells from spreading or growing. It may also be used to treat endometriosis. This is a condition where the tissue that lines the uterus grows outside the uterus. It works by decreasing the amount of estrogen your body makes, which reduces heavy bleeding and pain. It can also be used to help thin the lining of the uterus before a surgery used to prevent or reduce heavy periods. This medicine may be used for other purposes; ask your health care provider or pharmacist if you have questions. COMMON BRAND NAME(S): Zoladex, Zoladex 17-Month What should I tell my care team before I take this medication? They need to know if you have any of these conditions: Bone problems Diabetes Heart disease History of irregular heartbeat or rhythm An unusual or allergic reaction to goserelin, other medications, foods, dyes, or preservatives Pregnant or trying to get pregnant Breastfeeding How should I use this medication? This medication is injected under the skin. It is given by your care team in a hospital or clinic setting. Talk to your care team about the use of this medication in children. Special care may be needed. Overdosage: If you think you have taken too much of this medicine contact a poison control center or emergency room at once. NOTE: This medicine is only for you. Do not share this medicine with others. What if I miss a dose? Keep appointments for follow-up doses. It is important not to miss your dose. Call your care team if you are unable to keep an appointment. What may interact with this medication? Do not take this medication with any of the following: Cisapride Dronedarone Pimozide Thioridazine This medication may  also interact with the following: Other medications that cause heart rhythm changes This list may not describe all possible interactions. Give your health care provider a list of all the medicines, herbs, non-prescription drugs, or dietary supplements you use. Also tell them if you smoke, drink alcohol, or use illegal drugs. Some items may interact with your medicine. What should I watch for while using this medication? Visit your care team for regular checks on your progress. Your symptoms may appear to get worse during the first weeks of this therapy. Tell your care team if your symptoms do not start to get better or if they get worse after this time. Using this medication for a long time may weaken your bones. If you smoke or frequently drink alcohol you may increase your risk of bone loss. A family history of osteoporosis, chronic use of medications for seizures (convulsions), or corticosteroids can also increase your risk of bone loss. The risk of bone fractures may be increased. Talk to your care team about your bone health. This medication may increase blood sugar. The risk may be higher in patients who already have diabetes. Ask your care team what you can do to lower your risk of diabetes while taking this medication. This medication should stop regular monthly menstruation in women. Tell your care team if you continue to menstruate. Talk to your care team if you wish to become pregnant or think you might be pregnant. This medication can cause serious birth defects if taken during pregnancy or for 12 weeks after stopping treatment. Talk to your care team about reliable forms of  contraception. Do not breastfeed while taking this medication. This medication may cause infertility. Talk to your care team if you are concerned about your fertility. What side effects may I notice from receiving this medication? Side effects that you should report to your care team as soon as possible: Allergic  reactions--skin rash, itching, hives, swelling of the face, lips, tongue, or throat Change in the amount of urine Heart attack--pain or tightness in the chest, shoulders, arms, or jaw, nausea, shortness of breath, cold or clammy skin, feeling faint or lightheaded Heart rhythm changes--fast or irregular heartbeat, dizziness, feeling faint or lightheaded, chest pain, trouble breathing High blood sugar (hyperglycemia)--increased thirst or amount of urine, unusual weakness or fatigue, blurry vision High calcium level--increased thirst or amount of urine, nausea, vomiting, confusion, unusual weakness or fatigue, bone pain Pain, redness, irritation, or bruising at the injection site Severe back pain, numbness or weakness of the hands, arms, legs, or feet, loss of coordination, loss of bowel or bladder control Stroke--sudden numbness or weakness of the face, arm, or leg, trouble speaking, confusion, trouble walking, loss of balance or coordination, dizziness, severe headache, change in vision Swelling and pain of the tumor site or lymph nodes Trouble passing urine Side effects that usually do not require medical attention (report to your care team if they continue or are bothersome): Change in sex drive or performance Headache Hot flashes Rapid or extreme change in emotion or mood Sweating Swelling of the ankles, hands, or feet Unusual vaginal discharge, itching, or odor This list may not describe all possible side effects. Call your doctor for medical advice about side effects. You may report side effects to FDA at 1-800-FDA-1088. Where should I keep my medication? This medication is given in a hospital or clinic. It will not be stored at home. NOTE: This sheet is a summary. It may not cover all possible information. If you have questions about this medicine, talk to your doctor, pharmacist, or health care provider. Rehydration, Adult Rehydration is the replacement of fluids, salts, and minerals in  the body (electrolytes) that are lost during dehydration. Dehydration is when there is not enough water or other fluids in the body. This happens when you lose more fluids than you take in. Common causes of dehydration include: Not drinking enough fluids. This can occur when you are ill or doing activities that require a lot of energy, especially in hot weather. Conditions that cause loss of water or other fluids. These include diarrhea, vomiting, sweating, and urinating a lot. Other illnesses, such as fever or infection. Certain medicines, such as those that remove excess fluid from the body (diuretics). Symptoms of mild or moderate dehydration may include thirst, dry lips and mouth, and dizziness. Symptoms of severe dehydration may include increased heart rate, confusion, fainting, and not urinating. In severe cases, you may need to get fluids through an IV at the hospital. For mild or moderate cases, you can usually rehydrate at home by drinking certain fluids as told by your health care provider. What are the risks? Your health care provider will talk with you about risks. Your health care provider will talk with you about risks. This may include taking in too much fluid (overhydration). This is rare. Overhydration can cause an imbalance of electrolytes in the body, kidney failure, or a decrease in salt (sodium) levels in the body. Supplies needed: You will need an oral rehydration solution (ORS) if your health care provider tells you to use one. This is a drink to  treat dehydration. It can be found in pharmacies and retail stores. How to rehydrate Fluids Follow instructions from your health care provider about what to drink. The kind of fluid and the amount you should drink depend on your condition. In general, you should choose drinks that you prefer. If told by your health care provider, drink an ORS. Make an ORS by following instructions on the package. Start by drinking small amounts, about   cup (120 mL) every 5-10 minutes. Slowly increase how much you drink until you have taken in the amount recommended by your health care provider. Drink enough clear fluids to keep your urine pale yellow. If you were told to drink an ORS, finish it first, then start slowly drinking other clear fluids. Drink fluids such as: Water. This includes sparkling and flavored water. Drinking only water can lead to having too little sodium in your body (hyponatremia). Follow the advice of your health care provider. Water from ice chips you suck on. Fruit juice with water added to it (diluted). Sports drinks. Hot or cold herbal teas. Broth-based soups. Milk or milk products. Food Follow instructions from your health care provider about what to eat while you rehydrate. Your health care provider may recommend that you slowly begin eating regular foods in small amounts. Eat foods that contain a healthy balance of electrolytes, such as bananas, oranges, potatoes, tomatoes, and spinach. Avoid foods that are greasy or contain a lot of sugar. In some cases, you may get nutrition through a feeding tube that is passed through your nose and into your stomach (nasogastric tube, or NG tube). This may be done if you have uncontrolled vomiting or diarrhea. Drinks to avoid  Certain drinks may make dehydration worse. While you rehydrate, avoid drinking alcohol. How to tell if you are recovering from dehydration You may be getting better if: You are urinating more often than before you started rehydrating. Your urine is pale yellow. Your energy level improves. You vomit less often. You have diarrhea less often. Your appetite improves or returns to normal. You feel less dizzy or light-headed. Your skin tone and color start to look more normal. Follow these instructions at home: Take over-the-counter and prescription medicines only as told by your health care provider. Do not take sodium tablets. Doing this can lead  to having too much sodium in your body (hypernatremia). Contact a health care provider if: You continue to have symptoms of mild or moderate dehydration, such as: Thirst. Dry lips. Slightly dry mouth. Dizziness. Dark urine or less urine than normal. Muscle cramps. You continue to vomit or have diarrhea. Get help right away if: You have symptoms of dehydration that get worse. You have a fever. You have a severe headache. You have been vomiting and have problems, such as: Your vomiting gets worse or does not go away. Your vomit includes blood or green matter (bile). You cannot eat or drink without vomiting. You have problems with urination or bowel movements, such as: Diarrhea that gets worse or does not go away. Blood in your stool (feces). This may cause stool to look black and tarry. Not urinating, or urinating only a small amount of very dark urine, within 6-8 hours. You have trouble breathing. You have symptoms that get worse with treatment. These symptoms may be an emergency. Get help right away. Call 911. Do not wait to see if the symptoms will go away. Do not drive yourself to the hospital. This information is not intended to replace advice given  to you by your health care provider. Make sure you discuss any questions you have with your health care provider. Document Revised: 05/22/2021 Document Reviewed: 05/22/2021 Elsevier Patient Education  2024 [employer]vier [employer].   2024 Elsevier/Gold Standard (2021-06-01 00:00:00)

## 2023-04-29 LAB — ESTRADIOL, ULTRA SENS: Estradiol, Sensitive: <2.5 pg/mL

## 2023-05-22 ENCOUNTER — Inpatient Hospital Stay

## 2023-05-22 ENCOUNTER — Inpatient Hospital Stay: Admitting: Hematology and Oncology

## 2023-05-22 ENCOUNTER — Inpatient Hospital Stay: Payer: 59

## 2023-05-22 DIAGNOSIS — C50912 Malignant neoplasm of unspecified site of left female breast: Secondary | ICD-10-CM | POA: Diagnosis not present

## 2023-05-22 DIAGNOSIS — Z170 Estrogen receptor positive status [ER+]: Secondary | ICD-10-CM | POA: Diagnosis not present

## 2023-05-22 DIAGNOSIS — C50911 Malignant neoplasm of unspecified site of right female breast: Secondary | ICD-10-CM

## 2023-05-22 DIAGNOSIS — Z5111 Encounter for antineoplastic chemotherapy: Secondary | ICD-10-CM | POA: Diagnosis not present

## 2023-05-22 LAB — CBC WITH DIFFERENTIAL (CANCER CENTER ONLY)
Abs Immature Granulocytes: 0.01 10*3/uL (ref 0.00–0.07)
Basophils Absolute: 0.1 10*3/uL (ref 0.0–0.1)
Basophils Relative: 3 %
Eosinophils Absolute: 0.2 10*3/uL (ref 0.0–0.5)
Eosinophils Relative: 4 %
HCT: 33 % — ABNORMAL LOW (ref 36.0–46.0)
Hemoglobin: 11.4 g/dL — ABNORMAL LOW (ref 12.0–15.0)
Immature Granulocytes: 0 %
Lymphocytes Relative: 20 %
Lymphs Abs: 0.9 10*3/uL (ref 0.7–4.0)
MCH: 32.9 pg (ref 26.0–34.0)
MCHC: 34.5 g/dL (ref 30.0–36.0)
MCV: 95.4 fL (ref 80.0–100.0)
Monocytes Absolute: 0.4 10*3/uL (ref 0.1–1.0)
Monocytes Relative: 9 %
Neutro Abs: 3 10*3/uL (ref 1.7–7.7)
Neutrophils Relative %: 64 %
Platelet Count: 218 10*3/uL (ref 150–400)
RBC: 3.46 MIL/uL — ABNORMAL LOW (ref 3.87–5.11)
RDW: 12.3 % (ref 11.5–15.5)
WBC Count: 4.6 10*3/uL (ref 4.0–10.5)
nRBC: 0 % (ref 0.0–0.2)

## 2023-05-22 LAB — CMP (CANCER CENTER ONLY)
ALT: 28 U/L (ref 0–44)
AST: 28 U/L (ref 15–41)
Albumin: 4.4 g/dL (ref 3.5–5.0)
Alkaline Phosphatase: 63 U/L (ref 38–126)
Anion gap: 5 (ref 5–15)
BUN: 18 mg/dL (ref 6–20)
CO2: 28 mmol/L (ref 22–32)
Calcium: 9.2 mg/dL (ref 8.9–10.3)
Chloride: 106 mmol/L (ref 98–111)
Creatinine: 0.93 mg/dL (ref 0.44–1.00)
GFR, Estimated: >60 mL/min (ref >60)
Glucose, Bld: 88 mg/dL (ref 70–99)
Potassium: 4.2 mmol/L (ref 3.5–5.1)
Sodium: 139 mmol/L (ref 135–145)
Total Bilirubin: 0.5 mg/dL (ref 0.0–1.2)
Total Protein: 6.8 g/dL (ref 6.5–8.1)

## 2023-05-22 MED ORDER — GOSERELIN ACETATE 3.6 MG ~~LOC~~ IMPL
3.6000 mg | DRUG_IMPLANT | Freq: Once | SUBCUTANEOUS | Status: AC
  Administered 2023-05-22: 3.6 mg via SUBCUTANEOUS
  Filled 2023-05-22: qty 3.6

## 2023-05-22 NOTE — Progress Notes (Signed)
 Patient Care Team: Faustina Hood, MD as PCP - General (Family Medicine) Cameron Cea, MD as Consulting Physician (Hematology and Oncology) Alger Infield, MD as Consulting Physician (Plastic Surgery) Johna Myers, MD as Consulting Physician (Radiation Oncology) Enid Harry, MD as Consulting Physician (General Surgery) Althea Atkinson, RPH-CPP as Pharmacist (Hematology and Oncology)  DIAGNOSIS:  Encounter Diagnosis  Name Primary?   Bilateral malignant neoplasm of breast in female, estrogen receptor positive, unspecified site of breast (HCC) Yes    SUMMARY OF ONCOLOGIC HISTORY: Oncology History  Bilateral breast cancer (HCC)  11/01/2020 Initial Diagnosis   Right breast biopsy UOQ 9:30 position: Grade 2 IDC with DCIS ER 100%, PR [place], Ki-67 2%, HER2 negative Right breast biopsy LIQ: Grade 1 IDC with DCIS Left breast biopsy UOQ: DCIS intermediate grade    11/01/2020 Cancer Staging   Staging form: Breast, AJCC 8th Edition - Clinical: Stage IIA (cT3, cN0, cM0, G2, ER+, PR+, HER2-) - Signed by Cameron Cea, MD on 11/01/2020 Stage prefix: Initial diagnosis Histologic grading system: 3 grade system   11/16/2020 Genetic Testing   Negative genetic testing on the CancerNext-Expanded+RNAinsight.  The report date is 11/16/2020  The CancerNext-Expanded gene panel offered by [facility] and includes sequencing and rearrangement analysis for the following 77 genes: AIP, ALK, APC*, ATM*, AXIN2, BAP1, BARD1, BLM, BMPR1A, BRCA1*, BRCA2*, BRIP1*, CDC73, CDH1*, CDK4, CDKN1B, CDKN2A, CHEK2*, CTNNA1, DICER1, FANCC, FH, FLCN, GALNT12, KIF1B, LZTR1, MAX, MEN1, MET, MLH1*, MSH2*, MSH3, MSH6*, MUTYH*, NBN, NF1*, NF2, NTHL1, PALB2*, PHOX2B, PMS2*, POT1, PRKAR1A, PTCH1, PTEN*, RAD51C*, RAD51D*, RB1, RECQL, RET, SDHA, SDHAF2, SDHB, SDHC, SDHD, SMAD4, SMARCA4, SMARCB1, SMARCE1, STK11, SUFU, TMEM127, TP53*, TSC1, TSC2, VHL and XRCC2 (sequencing and deletion/duplication); EGFR, EGLN1, HOXB13,  KIT, MITF, PDGFRA, POLD1, and POLE (sequencing only); EPCAM and GREM1 (deletion/duplication only). DNA and RNA analyses performed for * genes.    12/06/2020 Surgery   Bilateral Mastectomies:  Left Mastectomy:HG DCIS 2.2 cm with necrosis, Margins Neg ER 90%, PR 40% Right Mastectomy: Grade 2 IDC with DCIS 3.8 cm, 1/3 LN ER 100%, PR 100%, Her 2 Neg, Ki 67: 2-15%   12/13/2020 Miscellaneous   MammaPrint: Luminal type A, low risk   01/27/2021 - 03/14/2021 Radiation Therapy   Site Technique Total Dose (Gy) Dose per Fx (Gy) Completed Fx Beam Energies  Chest Wall, Right: CW_R 3D 50.4/50.4 1.8 28/28 6XFFF  Chest Wall, Right: CW_R_SCLV 3D 50.4/50.4 1.8 28/28 6X, 10X  Chest Wall, Right: CW_R_Bst Electron 10/10 2 5/5 6E     03/22/2021 -  Anti-estrogen oral therapy   Adjuvant tamoxifen    Ductal carcinoma in situ (DCIS) of left breast  01/03/2021 Initial Diagnosis   Ductal carcinoma in situ (DCIS) of left breast   06/21/2021 Cancer Staging   Staging form: Breast, AJCC 8th Edition - Clinical: Stage 0 (cTis (DCIS), cN0, cM0, ER+, PR+) - Signed by Percival Brace, NP on 06/21/2021     CHIEF COMPLIANT: Follow-up of breast cancer on Verzenio   HISTORY OF PRESENT ILLNESS:  History of Present Illness Kaitlin [name] is a 48 year old female with breast cancer who presents for follow-up regarding her current treatment regimen.  She is on a 50 mg dose of Verzenio , which is more tolerable than previous dosages. She experiences stomach cramping, described as a 'pulling sensation' when her stomach is full, and painful cramps with raw food consumption. She has had diarrhea once since starting the 50 mg dose, which she has been on for about three weeks.  In March, she experienced diarrhea  for seven consecutive days while traveling, which she attributes to dietary changes and different gut bacteria. She felt severely dehydrated during this period, and her symptoms resolved after stopping the  medication.  She is concerned about her history of hemorrhoids being exacerbated by intermittent diarrhea and has a gastrointestinal appointment scheduled for May 8th. She regularly takes [employer] digestive enzymes and kefir.  Her blood work shows a white blood cell count of 4.6, hemoglobin at 11.4, and normal platelet, electrolyte, kidney, and liver function tests. Her anion gap, previously low, is now normal. She is on a monitoring regimen with blood tests every three months.     ALLERGIES:  is allergic to ceclor [cefaclor], cephalosporins, rizatriptan, amoxicillin, and penicillins.  MEDICATIONS:  Current Outpatient Medications  Medication Sig Dispense Refill   abemaciclib  (VERZENIO ) 50 MG tablet Take 1 tablet (50 mg total) by mouth 2 (two) times daily. Swallow tablets whole. Do not chew, crush, or split tablets before swallowing. 56 tablet 5   anastrozole  (ARIMIDEX ) 1 MG tablet Take 1 tablet (1 mg total) by mouth daily. 90 tablet 3   goserelin (ZOLADEX ) 3.6 MG injection Inject 3.6 mg into the skin.     ivermectin (STROMECTOL) 3 MG TABS tablet Take 12 mg by mouth once. Twice a week (Monday, Thursday)     lidocaine -prilocaine  (EMLA ) cream Apply 1 Application topically as needed. 30 g 0   Magnesium Glycinate 120 MG CAPS Take 240 mg by mouth at bedtime.     MISC NATURAL PRODUCTS PO Take by mouth daily. Super Digestive Enzymes by [employer]     Multiple Vitamins-Minerals (PREVENT) CAPS Take by mouth.     Omega-3 Fatty Acids (FISH OIL) 1000 MG CAPS Take 1,000 mg by mouth.     Probiotic, Lactobacillus, CAPS Take 1 tablet by mouth daily.     Vitamin D-Vitamin K (VITAMIN K2-VITAMIN D3 PO) Take by mouth daily. Vitamin D: 1.25 mcg / Vitamin K: 90 mcg     No current facility-administered medications for this visit.    PHYSICAL EXAMINATION: ECOG PERFORMANCE STATUS: 1 - Symptomatic but completely ambulatory  There were no vitals filed for this visit. There were no vitals filed for this  visit.  Physical Exam   (exam performed in the presence of a chaperone)  LABORATORY DATA:  I have reviewed the data as listed    Latest Ref Rng & Units 05/22/2023   11:20 AM 04/24/2023    9:25 AM 02/28/2023   11:12 AM  CMP  Glucose 70 - 99 mg/dL 88  73  045   BUN 6 - 20 mg/dL 18  14  15    Creatinine 0.44 - 1.00 mg/dL 4.09  8.11  9.14   Sodium 135 - 145 mmol/L 139  140  138   Potassium 3.5 - 5.1 mmol/L 4.2  4.2  4.4   Chloride 98 - 111 mmol/L 106  105  105   CO2 22 - 32 mmol/L 28  31  30    Calcium 8.9 - 10.3 mg/dL 9.2  9.8  9.3   Total Protein 6.5 - 8.1 g/dL 6.8  7.5  7.2   Total Bilirubin 0.0 - 1.2 mg/dL 0.5  0.4  0.4   Alkaline Phos 38 - 126 U/L 63  66  59   AST 15 - 41 U/L 28  33  25   ALT 0 - 44 U/L 28  29  28      Lab Results  Component Value Date   WBC 4.6  05/22/2023   HGB 11.4 (L) 05/22/2023   HCT 33.0 (L) 05/22/2023   MCV 95.4 05/22/2023   PLT 218 05/22/2023   NEUTROABS 3.0 05/22/2023    ASSESSMENT & PLAN:  Bilateral breast cancer (HCC) 12/06/20: Bilateral Mastectomies:  Left Mastectomy:HG DCIS 2.2 cm with necrosis, Margins Neg ER 90%, PR 40% Right Mastectomy: Grade 2 IDC with DCIS 3.8 cm, 1/3 LN ER 100%, PR 100%, Her 2 Neg, Ki 67: 2-15%   Treatment Plan: 1. Mammaprint: Luminal type a low risk 2. Adjuvant XRT completed 03/14/2021 3. Adjuvant Anti-estrogen therapy with tamoxifen  to start 03/22/2021-08/29/22 4. Recurrence:  Right breast palpable mass: Ultrasound: 6 mm, biopsy: Grade 2 IDC ER 95%, PR 95%, Ki67 30%, HER2 2+ by IHC FISH negative 09/25/2022: PET/CT: Benign --------------------------------------------------------------------------------------------------------------------- Treatment plan: Right lumpectomy: 09/26/2022: Recurrent IDC grade 2 measuring 0.8 cm, margins negative,ER 95%, PR 95%, Ki67 30%, HER2 2+ by IHC FISH negative Adjuvant ovarian function suppression with anastrozole .  Along with Verzinio started 11/09/2022   Surveillance: guardant reveal  December 2024: Negative   Abemaciclib  toxicities:  Intermittent diarrhea Constipation causing hemorrhoidal discomfort   Hot flashes and vaginal dryness   Return to clinic every 3 months for follow-ups ------------------------------------- Assessment and Plan Assessment & Plan Bilateral malignant neoplasm of breast, estrogen receptor positive Verzenio  50 mg effective. Diarrhea post-travel likely due to gut flora changes. No metastasis on current tests. Guardant testing used for monitoring. Verzenio  dosing based on tolerability with equal efficacy at reduced doses for side effects. - Continue Verzenio  50 mg orally twice daily. - Monitor blood counts and electrolytes every three months. - Discuss potential Verzenio  dose increase at next visit if symptoms improve. - Consider scan in August or September for reassurance. - Continue Guardant testing for monitoring.  Chronic diarrhea Intermittent diarrhea exacerbated by raw foods and travel, subsiding after Verzenio  cessation. Scheduled GI consultation for ongoing symptoms and hemorrhoids. - Continue current probiotics and discuss with GI specialist. - Attend GI appointment on May 8th. - Monitor symptoms and adjust Verzenio  dose if necessary.      Orders Placed This Encounter  Procedures   CBC with Differential (Cancer Center Only)    Standing Status:   Future    Expiration Date:   05/21/2024   CMP (Cancer Center only)    Standing Status:   Future    Expiration Date:   05/21/2024   The patient has a good understanding of the overall plan. she agrees with it. she will call with any problems that may develop before the next visit here. Total time spent: 30 mins including face to face time and time spent for planning, charting and co-ordination of care   Viinay K Vint Pola, MD 05/22/23

## 2023-05-22 NOTE — Assessment & Plan Note (Signed)
 12/06/20: Bilateral Mastectomies:  Left Mastectomy:HG DCIS 2.2 cm with necrosis, Margins Neg ER 90%, PR 40% Right Mastectomy: Grade 2 IDC with DCIS 3.8 cm, 1/3 LN ER 100%, PR 100%, Her 2 Neg, Ki 67: 2-15%   Treatment Plan: 1. Mammaprint: Luminal type a low risk 2. Adjuvant XRT completed 03/14/2021 3. Adjuvant Anti-estrogen therapy with tamoxifen  to start 03/22/2021-08/29/22 4. Recurrence:  Right breast palpable mass: Ultrasound: 6 mm, biopsy: Grade 2 IDC ER 95%, PR 95%, Ki67 30%, HER2 2+ by IHC FISH negative 09/25/2022: PET/CT: Benign --------------------------------------------------------------------------------------------------------------------- Treatment plan: Right lumpectomy: 09/26/2022: Recurrent IDC grade 2 measuring 0.8 cm, margins negative,ER 95%, PR 95%, Ki67 30%, HER2 2+ by IHC FISH negative Adjuvant ovarian function suppression with anastrozole .  Along with Verzinio started 11/09/2022   Surveillance: guardant reveal December 2024: Negative   Abemaciclib  toxicities:  Intermittent diarrhea Constipation causing hemorrhoidal discomfort   Hot flashes and vaginal dryness   Return to clinic every 3 months for follow-ups

## 2023-05-24 ENCOUNTER — Encounter: Payer: Self-pay | Admitting: Adult Health

## 2023-05-30 ENCOUNTER — Encounter: Payer: Self-pay | Admitting: Hematology and Oncology

## 2023-05-30 ENCOUNTER — Ambulatory Visit: Admitting: Physician Assistant

## 2023-05-30 ENCOUNTER — Encounter: Payer: Self-pay | Admitting: Physician Assistant

## 2023-05-30 VITALS — BP 110/68 | HR 72 | Ht 67.0 in | Wt 133.0 lb

## 2023-05-30 DIAGNOSIS — Z860101 Personal history of adenomatous and serrated colon polyps: Secondary | ICD-10-CM

## 2023-05-30 DIAGNOSIS — R198 Other specified symptoms and signs involving the digestive system and abdomen: Secondary | ICD-10-CM

## 2023-05-30 DIAGNOSIS — K649 Unspecified hemorrhoids: Secondary | ICD-10-CM

## 2023-05-30 DIAGNOSIS — Z860100 Personal history of colon polyps, unspecified: Secondary | ICD-10-CM

## 2023-05-30 DIAGNOSIS — K644 Residual hemorrhoidal skin tags: Secondary | ICD-10-CM

## 2023-05-30 DIAGNOSIS — K921 Melena: Secondary | ICD-10-CM | POA: Diagnosis not present

## 2023-05-30 DIAGNOSIS — R194 Change in bowel habit: Secondary | ICD-10-CM

## 2023-05-30 DIAGNOSIS — K648 Other hemorrhoids: Secondary | ICD-10-CM | POA: Diagnosis not present

## 2023-05-30 DIAGNOSIS — Z8719 Personal history of other diseases of the digestive system: Secondary | ICD-10-CM

## 2023-05-30 DIAGNOSIS — Z800 Family history of malignant neoplasm of digestive organs: Secondary | ICD-10-CM

## 2023-05-30 MED ORDER — SUTAB 1479-225-188 MG PO TABS: ORAL_TABLET | ORAL | 0 refills | Status: DC

## 2023-05-30 MED ORDER — ONDANSETRON HCL 4 MG PO TABS: 4.0000 mg | ORAL_TABLET | Freq: Four times a day (QID) | ORAL | 0 refills | Status: DC | PRN

## 2023-05-30 NOTE — Progress Notes (Signed)
 Kaitlin Canard, PA-C [address] [place], [place]  [address] Phone: [phone]   Gastroenterology Consultation  Referring Provider:     Faustina Hood, MD Primary Care Physician:  Faustina Hood, MD Primary Gastroenterologist:  Kaitlin Canard, PA-C / Legrand Puma, MD  Reason for Consultation:     Hemorrhoid, Blood in Stool, Irregular BM        HPI:   Kaitlin [name] is a 48 y.o. y/o female referred for consultation & management  by Faustina Hood, MD.    Patient states she has had intermittent rectal symptoms since giving birth to her first son in 2013 with difficult delivery and large tear.  She had a large anal fissure.  She has had intermittent hemorrhoids and tight anal sphincter tone.  In 2013 she underwent physical pelvic floor physical therapy at [facility] and her fissure improved.  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    In the past year she had recurrent breast cancer and was started on Verzenio  chemotherapy 10/2022.  She could not tolerate the higher dose 100 mg daily which caused severe diarrhea.  Dose was lowered to 50 Mg daily which was better tolerated.  She continues to have occasional episode of diarrhea alternating with constipation.  Had 2 episodes of rectal bleeding with bright red blood on the tissue after bowel movements in the past few months.  She is concerned about possible hemorrhoids.  She was having rectal pain, which has currently improved.  She is currently seeing physical therapist in [place] to help with pelvic floor muscle dysfunction.  She has used rectal dilators to help with tight anal sphincter.  Recently used hydrocortisone/pramoxine cream prescribed from her PCP with some benefit.  Has also used diltiazem lidocaine  cream to the rectum which helped anal fissure.  Currently having 1 or 2 formed stools per day which are not hard and not diarrhea.  Occasionally strains for BM.  07/2016 Colonoscopy by Dr. Howard Macho: Normal.   No polyps. Excellent Prep.    2013 Colonoscopy: dimunitive Sessile Serrated Adenoma removed.  Family history significant for paternal grandfather who had colon cancer age 75, paternal aunt who had colon cancer, father who had colon polyps, paternal uncle who had colon polyps.  10/2020 Negative genetic testing on the CancerNext-Expanded+RNAinsight.  The report date is 11/16/2020:  The CancerNext-Expanded gene panel offered by [facility] and includes sequencing and rearrangement analysis for the following 77 genes: AIP, ALK, APC*, ATM*, AXIN2, BAP1, BARD1, BLM, BMPR1A, BRCA1*, BRCA2*, BRIP1*, CDC73, CDH1*, CDK4, CDKN1B, CDKN2A, CHEK2*, CTNNA1, DICER1, FANCC, FH, FLCN, GALNT12, KIF1B, LZTR1, MAX, MEN1, MET, MLH1*, MSH2*, MSH3, MSH6*, MUTYH*, NBN, NF1*, NF2, NTHL1, PALB2*, PHOX2B, PMS2*, POT1, PRKAR1A, PTCH1, PTEN*, RAD51C*, RAD51D*, RB1, RECQL, RET, SDHA, SDHAF2, SDHB, SDHC, SDHD, SMAD4, SMARCA4, SMARCB1, SMARCE1, STK11, SUFU, TMEM127, TP53*, TSC1, TSC2, VHL and XRCC2 (sequencing and deletion/duplication); EGFR, EGLN1, HOXB13, KIT, MITF, PDGFRA, POLD1, and POLE (sequencing only); EPCAM and GREM1 (deletion/duplication only). DNA and RNA analyses performed for * genes.   Past Medical History:  Diagnosis Date   Allergy    Anal fissure    Anemia    Asthma    Breast cancer (HCC)    twice   Cardiomyopathy (HCC)    Chronic headaches    Delivery normal 11/2008   Family history of colon cancer    Family history of leukemia    Family history of melanoma    Family history of ovarian cancer  Heart murmur    Kidney stone    x of   PONV (postoperative nausea and vomiting)    S/P tonsillectomy    2000    Past Surgical History:  Procedure Laterality Date   BREAST BIOPSY Right 09/12/2022   US  RT BREAST BX W LOC DEV 1ST LESION IMG BX SPEC US  GUIDE 09/12/2022 [facility]   BREAST BIOPSY Left 01/18/2023   US  LT BREAST BX W LOC DEV 1ST LESION IMG BX SPEC US  GUIDE 01/18/2023 [facility]    BREAST CYST EXCISION Right 09/26/2022   Procedure: RIGHT BREAST CANCER RECURRENT EXCISION;  Surgeon: Enid Harry, MD;  Location: [facility];  Service: General;  Laterality: Right;  LMA   BREAST RECONSTRUCTION WITH PLACEMENT OF TISSUE EXPANDER AND ALLODERM Bilateral 12/06/2020   Procedure: BREAST RECONSTRUCTION WITH PLACEMENT OF TISSUE EXPANDER AND ALLODERM;  Surgeon: Alger Infield, MD;  Location: [facility];  Service: Plastics;  Laterality: Bilateral;   LEEP     MASTECTOMY W/ SENTINEL NODE BIOPSY Right 12/06/2020   Procedure: RIGHT MASTECTOMY WITH RIGHT AXILLARY SENTINEL LYMPH NODE BIOPSY;  Surgeon: Enid Harry, MD;  Location: [facility];  Service: General;  Laterality: Right;   REMOVAL OF BILATERAL TISSUE EXPANDERS WITH PLACEMENT OF BILATERAL BREAST IMPLANTS Bilateral 09/15/2021   Procedure: REMOVAL OF BILATERAL TISSUE EXPANDERS WITH PLACEMENT OF BILATERAL BREAST IMPLANTS;  Surgeon: Alger Infield, MD;  Location: [facility];  Service: Plastics;  Laterality: Bilateral;   tonsil removal  01/22/1998   TOTAL MASTECTOMY Left 12/06/2020   Procedure: LEFT TOTAL MASTECTOMY;  Surgeon: Enid Harry, MD;  Location: [facility];  Service: General;  Laterality: Left;    Prior to Admission medications   Medication Sig Start Date End Date Taking? Authorizing Provider  abemaciclib  (VERZENIO ) 50 MG tablet Take 1 tablet (50 mg total) by mouth 2 (two) times daily. Swallow tablets whole. Do not chew, crush, or split tablets before swallowing. 04/24/23   Gudena, Vinay, MD  anastrozole  (ARIMIDEX ) 1 MG tablet Take 1 tablet (1 mg total) by mouth daily. 10/04/22   Gudena, Vinay, MD  goserelin (ZOLADEX ) 3.6 MG injection Inject 3.6 mg into the skin.    [provider]  ivermectin (STROMECTOL) 3 MG TABS tablet Take 12 mg by mouth once. Twice a week (Monday, Thursday)    [provider]  lidocaine -prilocaine  (EMLA ) cream Apply 1  Application topically as needed. 01/03/23   Gudena, Vinay, MD  Magnesium Glycinate 120 MG CAPS Take 240 mg by mouth at bedtime.    [provider]  MISC NATURAL PRODUCTS PO Take by mouth daily. Super Digestive Enzymes by [employer]    [provider]  Multiple Vitamins-Minerals (PREVENT) CAPS Take by mouth.    [provider]  Omega-3 Fatty Acids (FISH OIL) 1000 MG CAPS Take 1,000 mg by mouth.    [provider]  Probiotic, Lactobacillus, CAPS Take 1 tablet by mouth daily.    [provider]  Vitamin D-Vitamin K (VITAMIN K2-VITAMIN D3 PO) Take by mouth daily. Vitamin D: 1.25 mcg / Vitamin K: 90 mcg    [provider]    Family History  Problem Relation Age of Onset   Memory loss Mother    Ulcerative colitis Mother    Colon polyps Father    Bladder Cancer Father    Irritable bowel syndrome Sister    Leukemia Maternal Aunt 50       AML   Melanoma Maternal Aunt 60  Breast cancer Paternal Aunt 56   Colon cancer Paternal Aunt    Leukemia Paternal Aunt 21   Prostate cancer Paternal Uncle 60   Colon polyps Paternal Uncle    Cervical cancer Maternal Grandmother    Parkinson's disease Maternal Grandmother    Alzheimer's disease Maternal Grandmother    Multiple myeloma Maternal Grandfather    Skin cancer Maternal Grandfather    Skin cancer Paternal Grandmother    Colon cancer Paternal Grandfather 48   Stroke Paternal Grandfather    Ovarian cancer Other 56       MGMs sister   Esophageal cancer Neg Hx    Rectal cancer Neg Hx    Stomach cancer Neg Hx      Social History   Tobacco Use   Smoking status: Former    Types: Cigarettes   Smokeless tobacco: Never  Vaping Use   Vaping status: Never Used  Substance Use Topics   Alcohol use: Not Currently   Drug use: No    Allergies as of 05/30/2023 - Review Complete 05/30/2023  Allergen Reaction Noted   Ceclor [cefaclor] Hives and Shortness Of Breath 05/19/2010    Cephalosporins Anaphylaxis and Hives 06/07/2022   Rizatriptan Anaphylaxis 12/25/2013   Amoxicillin Hives and Rash 11/02/2020   Penicillins Other (See Comments) 09/21/2021    Review of Systems:    All systems reviewed and negative except where noted in HPI.   Physical Exam:  BP 110/68   Pulse 72   Ht 5\' 7"  (1.702 m)   Wt 133 lb (60.3 kg)   BMI 20.83 kg/m  No LMP recorded.  General:   Alert,  Well-developed, well-nourished, pleasant and cooperative in NAD Lungs:  Respirations even and unlabored.  Clear throughout to auscultation.   No wheezes, crackles, or rhonchi. No acute distress. Heart:  Regular rate and rhythm; no murmurs, clicks, rubs, or gallops. Abdomen:  Normal bowel sounds.  No bruits.  Soft, and non-distended without masses, hepatosplenomegaly or hernias noted.  No Tenderness.  No guarding or rebound tenderness.   Rectal: Small external hemorrhoid skin tags present.  No evidence of anal fissure or tenderness.  No swollen external hemorrhoids.  Tight anal sphincter tone with question of mild anal stricture.  No internal masses or tenderness.  Hemoccult negative. Neurologic:  Alert and oriented x3;  grossly normal neurologically. Psych:  Alert and cooperative. Normal mood and affect.  Chaperone for Exam:  Leellen Puller, CMA   Imaging Studies: No results found.  Labs: CBC    Component Value Date/Time   WBC 4.6 05/22/2023 1120   WBC 4.3 09/26/2022 0835   RBC 3.46 (L) 05/22/2023 1120   HGB 11.4 (L) 05/22/2023 1120   HCT 33.0 (L) 05/22/2023 1120   PLT 218 05/22/2023 1120   MCV 95.4 05/22/2023 1120   MCH 32.9 05/22/2023 1120   MCHC 34.5 05/22/2023 1120   RDW 12.3 05/22/2023 1120   LYMPHSABS 0.9 05/22/2023 1120   MONOABS 0.4 05/22/2023 1120   EOSABS 0.2 05/22/2023 1120   BASOSABS 0.1 05/22/2023 1120    CMP     Component Value Date/Time   NA 139 05/22/2023 1120   K 4.2 05/22/2023 1120   CL 106 05/22/2023 1120   CO2 28 05/22/2023 1120   GLUCOSE 88 05/22/2023  1120   BUN 18 05/22/2023 1120   CREATININE 0.93 05/22/2023 1120   CALCIUM 9.2 05/22/2023 1120   PROT 6.8 05/22/2023 1120   ALBUMIN 4.4 05/22/2023 1120   AST 28 05/22/2023 1120  ALT 28 05/22/2023 1120   ALKPHOS 63 05/22/2023 1120   BILITOT 0.5 05/22/2023 1120   GFRNONAA >60 05/22/2023 1120    Assessment and Plan:   Kaitlin [name] is a 48 y.o. y/o female has been referred for   Hemorrhoids:  For External Hemorrhoids: Warm water sitz bath with epsom salt for flare up of external hemorrhoids. Use OTC Preparation H, Tucks Pads, and Witch Hazel wipes as needed. Continue hydrocortisone/pramoxine cream as needed.  For Internal Hemorrhoids: Rx Hydrocortisone Suppositories 25mg  Insert 1 into rectum once daily at bedtime for 10-14 days.  Call back for Rx if needed. Stressed importance of treating underlying constipation. Avoid Sitting on the toilet for prolonged amount of time. Discussed Internal Hemorrhoid Banding if no improvement with conservative treament.   Hx Anal Fissure and Tight Anal Sphincter- No fissure on Exam today. -Rx Diltiazem / Lidocaine  Cream if she has recurrent fissure in the future. - Continue pelvic floor physical therapy in [place].  Blood in Stool -mild, 2 episodes.  Hemoccult negative exam today. -Scheduling Colonoscopy I discussed risks of colonoscopy with patient to include risk of bleeding, colon perforation, and risk of sedation.  Patient expressed understanding and agrees to proceed with colonoscopy.   Hx adenomatous colon polyp and Family Hx Colon Cancer / Colon Polyps -Scheduling Colonoscopy I discussed risks of colonoscopy with patient to include risk of bleeding, colon perforation, and risk of sedation.  Patient expressed understanding and agrees to proceed with colonoscopy.   Irregular Bowel Habits. Start OTC Benefiber Powder. Mix 1 - 2 Tablespoons in 6 - 8 ounces of a Drink Once Daily. Drink 64 ounces of water / fluids Daily.    Follow  up As Needed.  Kaitlin Canard, PA-C

## 2023-05-30 NOTE — Patient Instructions (Addendum)
 You have been scheduled for a colonoscopy. Please follow written instructions given to you at your visit today.   If you use inhalers (even only as needed), please bring them with you on the day of your procedure. __________________________________________________________________________  For External Hemorrhoids: Warm water sitz bath with epsom salt for flare up of external hemorrhoids. Use OTC Preparation H, Tucks Pads, and Witch Hazel wipes as needed. Continue Rx Hydrocortisone / Pramoxine 2.5% Cream 2-3 times daily as needed. Discussed referral for Surgery as a last resort if Conservative treatment fails.  For Internal Hemorrhoids: Rx Hydrocortisone Suppositories 25mg  Insert 1 into rectum once daily at bedtime for 10-14 days. Stressed importance of treating underlying constipation. Avoid Sitting on the toilet for prolonged amount of time. Avoid straining to have a bowel movement.    Start OTC Benefiber Powder. Mix 1 - 2 Tablespoons in 6 - 8 ounces of a Drink Once Daily. Drink 64 ounces of water / fluids Daily.

## 2023-06-03 ENCOUNTER — Ambulatory Visit: Payer: 59 | Attending: Adult Health

## 2023-06-03 VITALS — Wt 133.0 lb

## 2023-06-03 DIAGNOSIS — Z483 Aftercare following surgery for neoplasm: Secondary | ICD-10-CM | POA: Insufficient documentation

## 2023-06-03 NOTE — Therapy (Signed)
 OUTPATIENT PHYSICAL THERAPY SOZO SCREENING NOTE   Patient Name: Kaitlin [name] MRN: [id number] DOB:[birth year], 48 y.o., female Today's Date: 06/03/2023  PCP: Faustina Hood, MD REFERRING PROVIDER: Alwin Baars Cornett*   PT End of Session - 06/03/23 0824     Visit Number 12   # unchanged due to screen only   PT Start Time 0823    PT Stop Time 0827    PT Time Calculation (min) 4 min    Activity Tolerance Patient tolerated treatment well    Behavior During Therapy [facility] for tasks assessed/performed             Past Medical History:  Diagnosis Date   Allergy    Anal fissure    Anemia    Asthma    Breast cancer (HCC)    twice   Cardiomyopathy (HCC)    Chronic headaches    Delivery normal 11/2008   Family history of colon cancer    Family history of leukemia    Family history of melanoma    Family history of ovarian cancer    Heart murmur    Kidney stone    x of   PONV (postoperative nausea and vomiting)    S/P tonsillectomy    2000   Past Surgical History:  Procedure Laterality Date   BREAST BIOPSY Right 09/12/2022   US  RT BREAST BX W LOC DEV 1ST LESION IMG BX SPEC US  GUIDE 09/12/2022 [facility]   BREAST BIOPSY Left 01/18/2023   US  LT BREAST BX W LOC DEV 1ST LESION IMG BX SPEC US  GUIDE 01/18/2023 [facility]   BREAST CYST EXCISION Right 09/26/2022   Procedure: RIGHT BREAST CANCER RECURRENT EXCISION;  Surgeon: Enid Harry, MD;  Location: [facility];  Service: General;  Laterality: Right;  LMA   BREAST RECONSTRUCTION WITH PLACEMENT OF TISSUE EXPANDER AND ALLODERM Bilateral 12/06/2020   Procedure: BREAST RECONSTRUCTION WITH PLACEMENT OF TISSUE EXPANDER AND ALLODERM;  Surgeon: Alger Infield, MD;  Location: [facility];  Service: Plastics;  Laterality: Bilateral;   LEEP     MASTECTOMY W/ SENTINEL NODE BIOPSY Right 12/06/2020   Procedure: RIGHT MASTECTOMY WITH RIGHT AXILLARY SENTINEL LYMPH NODE BIOPSY;  Surgeon: Enid Harry,  MD;  Location: [facility];  Service: General;  Laterality: Right;   REMOVAL OF BILATERAL TISSUE EXPANDERS WITH PLACEMENT OF BILATERAL BREAST IMPLANTS Bilateral 09/15/2021   Procedure: REMOVAL OF BILATERAL TISSUE EXPANDERS WITH PLACEMENT OF BILATERAL BREAST IMPLANTS;  Surgeon: Alger Infield, MD;  Location: [facility];  Service: Plastics;  Laterality: Bilateral;   tonsil removal  01/22/1998   TOTAL MASTECTOMY Left 12/06/2020   Procedure: LEFT TOTAL MASTECTOMY;  Surgeon: Enid Harry, MD;  Location:  SURGERY CENTER;  Service: General;  Laterality: Left;   Patient Active Problem List   Diagnosis Date Noted   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   Malignant neoplasm of upper-outer quadrant of right breast in female, estrogen receptor positive (HCC) 12/12/2020   S/P bilateral mastectomy 12/06/2020   Genetic testing 11/17/2020   Family history of breast cancer 11/02/2020   Family history of ovarian cancer 11/02/2020   Family history of colon cancer 11/02/2020   Family history of melanoma 11/02/2020   Family history of leukemia 11/02/2020   Bilateral breast cancer (HCC) 11/01/2020   LLQ abdominal pain 04/06/2011    REFERRING DIAG: right breast cancer at risk for lymphedema  THERAPY DIAG: Aftercare following surgery for neoplasm  PERTINENT HISTORY: Bil mastectomy  with immediate reconstruction 12/06/20 with 1/3 LN positive.  Rt Grade 2 IDC with DCIS ER positive.  Lt breast DCIS intermediate, no LN. Radiation 01/27/21-03/14/2021 to Right chest.  Implants 09/15/21   PRECAUTIONS: right UE Lymphedema risk, None  SUBJECTIVE: Pt returns for her last 3 month L-Dex screen.   PAIN:  Are you having pain? No  SOZO SCREENING: Patient was assessed today using the SOZO machine to determine the lymphedema index score. This was compared to her baseline score. It was determined that she is within the recommended range when compared to her  baseline and no further action is needed at this time. She will continue SOZO screenings. These are done every 3 months for 2 years post operatively followed by every 6 months for 2 years, and then annually.      L-DEX FLOWSHEETS - 06/03/23 0800       L-DEX LYMPHEDEMA SCREENING   Measurement Type Unilateral    L-DEX MEASUREMENT EXTREMITY Upper Extremity    POSITION  Standing    DOMINANT SIDE Right    At Risk Side Right    BASELINE SCORE (UNILATERAL) -0.2    L-DEX SCORE (UNILATERAL) 1.4    VALUE CHANGE (UNILAT) 1.6             P: Begin 6 month screens.    Denyce Flank, PTA 06/03/2023, 8:25 AM

## 2023-06-07 ENCOUNTER — Encounter: Payer: Self-pay | Admitting: *Deleted

## 2023-06-07 NOTE — Progress Notes (Signed)
 Guardant Reveal Renewal orders placed per MD request.

## 2023-06-11 ENCOUNTER — Ambulatory Visit: Attending: Hematology and Oncology | Admitting: Physical Therapy

## 2023-06-11 ENCOUNTER — Other Ambulatory Visit: Payer: Self-pay

## 2023-06-11 ENCOUNTER — Encounter: Payer: Self-pay | Admitting: Physical Therapy

## 2023-06-11 DIAGNOSIS — M6281 Muscle weakness (generalized): Secondary | ICD-10-CM | POA: Insufficient documentation

## 2023-06-11 DIAGNOSIS — R279 Unspecified lack of coordination: Secondary | ICD-10-CM | POA: Diagnosis present

## 2023-06-11 DIAGNOSIS — Z780 Asymptomatic menopausal state: Secondary | ICD-10-CM | POA: Insufficient documentation

## 2023-06-11 DIAGNOSIS — R293 Abnormal posture: Secondary | ICD-10-CM | POA: Insufficient documentation

## 2023-06-11 NOTE — Progress Notes (Signed)
 Noted

## 2023-06-11 NOTE — Therapy (Signed)
 OUTPATIENT PHYSICAL THERAPY FEMALE PELVIC EVALUATION   Patient Name: Kaitlin [name] MRN: [id number] DOB:10-23-1975, 48 y.o., female Today's Date: 06/11/2023  END OF SESSION:  PT End of Session - 06/11/23 1644     Visit Number 1    Number of Visits 12    Date for PT Re-Evaluation 10/12/23    Authorization Type [employer]    PT Start Time 0200    PT Stop Time 0245    PT Time Calculation (min) 45 min    Activity Tolerance Patient tolerated treatment well    Behavior During Therapy WFL for tasks assessed/performed             Past Medical History:  Diagnosis Date   Allergy    Anal fissure    Anemia    Asthma    Breast cancer (HCC)    twice   Cardiomyopathy (HCC)    Chronic headaches    Delivery normal 11/2008   Family history of colon cancer    Family history of leukemia    Family history of melanoma    Family history of ovarian cancer    Heart murmur    Kidney stone    x of   PONV (postoperative nausea and vomiting)    S/P tonsillectomy    2000   Past Surgical History:  Procedure Laterality Date   BREAST BIOPSY Right 09/12/2022   US  RT BREAST BX W LOC DEV 1ST LESION IMG BX SPEC US  GUIDE 09/12/2022 [facility]   BREAST BIOPSY Left 01/18/2023   US  LT BREAST BX W LOC DEV 1ST LESION IMG BX SPEC US  GUIDE 01/18/2023 [facility]AMMOGR[facility]   BREAST CYST EXCISION Right 09/26/2022   Procedure: RIGHT BREAST CANCER RECURRENT EXCISION;  Surgeon: Enid Harry, MD;  Location: [facility];  Service: General;  Laterality: Right;  LMA   BREAST RECONSTRUCTION WITH PLACEMENT OF TISSUE EXPANDER AND ALLODERM Bilateral 12/06/2020   Procedure: BREAST RECONSTRUCTION WITH PLACEMENT OF TISSUE EXPANDER AND ALLODERM;  Surgeon: Alger Infield, MD;  Location: [facility];  Service: Plastics;  Laterality: Bilateral;   LEEP     MASTECTOMY W/ SENTINEL NODE BIOPSY Right 12/06/2020   Procedure: RIGHT MASTECTOMY WITH RIGHT AXILLARY SENTINEL LYMPH NODE BIOPSY;  Surgeon:  Enid Harry, MD;  Location: [facility];  Service: General;  Laterality: Right;   REMOVAL OF BILATERAL TISSUE EXPANDERS WITH PLACEMENT OF BILATERAL BREAST IMPLANTS Bilateral 09/15/2021   Procedure: REMOVAL OF BILATERAL TISSUE EXPANDERS WITH PLACEMENT OF BILATERAL BREAST IMPLANTS;  Surgeon: Alger Infield, MD;  Location: [facility];  Service: Plastics;  Laterality: Bilateral;   tonsil removal  01/22/1998   TOTAL MASTECTOMY Left 12/06/2020   Procedure: LEFT TOTAL MASTECTOMY;  Surgeon: Enid Harry, MD;  Location: [facility];  Service: General;  Laterality: Left;   Patient Active Problem List   Diagnosis Date Noted   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   Malignant neoplasm of upper-outer quadrant of right breast in female, estrogen receptor positive (HCC) 12/12/2020   S/P bilateral mastectomy 12/06/2020   Genetic testing 11/17/2020   Family history of breast cancer 11/02/2020   Family history of ovarian cancer 11/02/2020   Family history of colon cancer 11/02/2020   Family history of melanoma 11/02/2020   Family history of leukemia 11/02/2020   Bilateral breast cancer (HCC) 11/01/2020   LLQ abdominal pain 04/06/2011    PCP: Faustina Hood, MD  REFERRING PROVIDER: Cameron Cea, MD   REFERRING DIAG:  Z78.0 (ICD-10-CM) - Post-menopausal  THERAPY DIAG:  Muscle weakness (generalized)  Unspecified lack of coordination  Abnormal posture  Rationale for Evaluation and Treatment: Rehabilitation  ONSET DATE: 2010  SUBJECTIVE:                                                                                                                                                                                           SUBJECTIVE STATEMENT: Patient reports that she had breast cancer in 2022 and a local reoccurrence in Sept 2024, which led to her being placed in medical menopause in Oct of 2024. Since then, she has  experienced vaginal dryness and tightness in the pelvic floor, contributing to hemorrhoids and painful intercourse. In 2010 and 2014 she had vaginal deliveries, both with tearing. She has seen a GI specialist and plans to have a colonoscopy soon. She has seen a pelvic PT for 3 visits and from that experience she has dilators (6 different sizes - has not used these yet) and cups. She has had a double mastectomy and has lots of scar tissue under the ribs, which she has used the cups for in the past. She feels like her pelvic floor gets crampy every now and again. No bladder related issues to report. She has a vibration plate and rebounder at home. She sees a chiro 1x/wk consistently.  Fluid intake: drinks a lot of water - tries to get 60-90oz   PAIN:  Are you having pain? No NPRS scale: 0/10  PRECAUTIONS: None  RED FLAGS: None   WEIGHT BEARING RESTRICTIONS: No  FALLS:  Has patient fallen in last 6 months? No  OCCUPATION: works currently with college students   ACTIVITY LEVEL : works out 5-6x/wk at [employer], walks 1 mile most days  PLOF: Independent with basic ADLs  PATIENT GOALS: decrease pelvic floor tension at rest and hemorrhoid discomfort, decrease scar tissue restriction present from mastectomy surgery and from vaginal tearing during deliveries   PERTINENT HISTORY:  LEEP vaginally 2015, double mastectomy, 3 endometrial polyps all benign in 09/2022   BOWEL MOVEMENT: Pain with bowel movement: No - will only have pain with hemorrhoids when they are present  Type of bowel movement:Type (Bristol Stool Scale) 3-4, Frequency 1-2x/day, Strain no, and Splinting no Fully empty rectum: No  Leakage: No Pads: No Fiber supplement/laxative Yes   URINATION: Pain with urination: No Fully empty bladder: Yes:   Stream: Strong Urgency: Yes  Frequency: within normal limits  Leakage: none Pads: No  INTERCOURSE: not currently sexually active due to pain - feels like barbwire when she  tries to have intercourse    Ability to have vaginal  penetration No  Pain with intercourse: Pain Interrupts Intercourse DrynessYes  Marinoff Scale: 3/3  PREGNANCY: Vaginal deliveries 2 Tearing Yes: with both deliveries, first tear was lateral in nature   PROLAPSE: None  OBJECTIVE:  Note: Objective measures were completed at Evaluation unless otherwise noted.  PATIENT SURVEYS:  PFIQ-7: 32  COGNITION: Overall cognitive status: Within functional limits for tasks assessed     SENSATION: Light touch: Appears intact  LUMBAR SPECIAL TESTS:  Single leg stance test: Positive  FUNCTIONAL TESTS:  Squat test: mild dynamic knee valgus with loading, no significant lumbopelvic stiffness present   GAIT: Assistive device utilized: None Comments: mild trendelenburg gait pattern with ambulation   POSTURE: rounded shoulders, forward head, and increased thoracic kyphosis  LUMBARAROM/PROM:  A/PROM A/PROM  eval  Flexion Within normal limits   Extension Within normal limits   Right lateral flexion Within normal limits   Left lateral flexion Within normal limits   Right rotation Within normal limits   Left rotation Within normal limits    (Blank rows = not tested)  LOWER EXTREMITY ROM: within functional limits   LOWER EXTREMITY MMT: 4/5 bilateral knees and hips grossly  PALPATION:   General: no significant tenderness to palpation of bilateral adductors or hip flexors   Pelvic Alignment: within normal limits   Abdominal: upper chest breathing and abdominal bracing at rest, decreased lower rib excursion with inhalation                 External Perineal Exam: dryness noted with mild clitoral hood mobility restriction present                              Internal Pelvic Floor: Patient demonstrates normal tone throughout the superficial and deep pelvic floor muscles bilaterally. When patient contracts her pelvic floor, muscle tension remains throughout the pelvic floor without the  patient doing so on her own. She demonstrates stiffness throughout superficial and deep pelvic floor, most likely due to increased muscle tension at rest. With deep diaphragmatic breathing in hooklying, patient is able to actively lengthen/relax her pelvic floor with inhalation. No pain following today's examination.    Patient confirms identification and approves PT to assess internal pelvic floor and treatment Yes No emotional/communication barriers or cognitive limitation. Patient is motivated to learn. Patient understands and agrees with treatment goals and plan. PT explains patient will be examined in standing, sitting, and lying down to see how their muscles and joints work. When they are ready, they will be asked to remove their underwear so PT can examine their perineum. The patient is also given the option of providing their own chaperone as one is not provided in our facility. The patient also has the right and is explained the right to defer or refuse any part of the evaluation or treatment including the internal exam. With the patient's consent, PT will use one gloved finger to gently assess the muscles of the pelvic floor, seeing how well it contracts and relaxes and if there is muscle symmetry. After, the patient will get dressed and PT and patient will discuss exam findings and plan of care. PT and patient discuss plan of care, schedule, attendance policy and HEP activities.  PELVIC MMT:   MMT eval  Vaginal 4/5, 5 quick flicks, 5 second hold  Internal Anal Sphincter   External Anal Sphincter   Puborectalis   Diastasis Recti   (Blank rows = not tested)  TONE: Increased  following pelvic floor muscle contraction  PROLAPSE: N/A   TODAY'S TREATMENT:                                                                                                                              DATE:   EVAL Examination completed, findings reviewed, pt educated on POC, HEP, and self care. Pt motivated to  participate in PT and agreeable to attempt recommendations.   Neuro re-ed: Hooklying diaphragmatic breathing with pelvic floor lengthening during inhalation 2x10  Supine butterfly stretch + diaphragmatic breathing 2x84min  Childs pose + diaphragmatic breathing 2x4min  Self care:  Relative anatomy and the connection between the diaphragm and pelvic floor, silicone based lubricant samples and education, vaginal moisturizer education  PATIENT EDUCATION:  Education details: Relative anatomy and the connection between the diaphragm and pelvic floor, silicone based lubricant samples and education, vaginal moisturizer education Person educated: Patient Education method: Explanation, Demonstration, Tactile cues, Verbal cues, and Handouts Education comprehension: verbalized understanding, returned demonstration, verbal cues required, tactile cues required, and needs further education  HOME EXERCISE PROGRAM: Access Code: [email or web address] URL: [email or web address] Date: 06/11/2023 Prepared by: Robbin Chill  Exercises - Supine Diaphragmatic Breathing  - 1 x daily - 7 x weekly - 3 sets - 10 reps - Supine Butterfly Groin Stretch  - 1 x daily - 7 x weekly - 3 sets - hold - Child's Pose Stretch  - 1 x daily - 7 x weekly - 3 sets - hold  ASSESSMENT:  CLINICAL IMPRESSION: Patient is a 48 y.o. female  who was seen today for physical therapy evaluation and treatment for post-menopausal related care. Since undergoing treatment for breast cancer in 2022 and being medically induced into menopause, patient has been experiencing vaginal dryness, tightness, pain during intercourse, and hemorrhoids. Patient demonstrates normal tone throughout the superficial and deep pelvic floor muscles bilaterally. When patient contracts her pelvic floor, muscle tension remains throughout the pelvic floor without the patient doing so on her own. She demonstrates stiffness throughout superficial and deep pelvic  floor, most likely due to increased muscle tension at rest. With deep diaphragmatic breathing in hooklying, patient is able to actively lengthen/relax her pelvic floor with inhalation. No pain following today's examination. Overall, patient tolerated session well and Pt would benefit from additional PT to further address deficits.    OBJECTIVE IMPAIRMENTS: decreased coordination, decreased endurance, decreased mobility, decreased ROM, decreased strength, and pain.   ACTIVITY LIMITATIONS: vaginal penetration during intercourse/pelvic examinations   PARTICIPATION LIMITATIONS: N/A  PERSONAL FACTORS: Age, Past/current experiences, and Time since onset of injury/illness/exacerbation are also affecting patient's functional outcome.   REHAB POTENTIAL: Good  CLINICAL DECISION MAKING: Stable/uncomplicated  EVALUATION COMPLEXITY: Low   GOALS: Goals reviewed with patient? Yes  SHORT TERM GOALS: Target date: 07/09/2023  Pt will be independent with HEP.  Baseline: Goal status: INITIAL  2.  Pt will be independent with diaphragmatic breathing and down training activities in order to improve pelvic  floor relaxation. Baseline:  Goal status: INITIAL  3.  Pt will be independent with use of squatty potty, relaxed toileting mechanics, and improved bowel movement techniques in order to increase ease of bowel movements and complete evacuation.   Baseline:  Goal status: INITIAL  LONG TERM GOALS: Target date: 12/12/2023  Pt will be independent with advanced HEP.  Baseline:  Goal status: INITIAL  2.  Pt will be ind with using dilators for reducing pelvic floor tension and pain management to allow patient to return to vaginal penetration without significant pain to improve quality of life and relationship with partner.  Baseline:  Goal status: INITIAL  3.  Pt will report less than 2/10 pain with vaginal penetration in order to improve intimate relationship with partner and allow patient to  participate in pelvic examinations with little to no pain. Baseline:  Goal status: INITIAL  4.  Pt will be able to correctly perform diaphragmatic breathing and appropriate pressure management in order to prevent vaginal wall laxity and improve pelvic floor A/ROM at rest so that she is able to utilize full pelvic floor active range of motion.  Baseline:  Goal status: INITIAL  PLAN:  PT FREQUENCY: 1x/week  PT DURATION: 12 weeks  PLANNED INTERVENTIONS: 97110-Therapeutic exercises, 97530- Therapeutic activity, 97112- Neuromuscular re-education, 97535- Self Care, [id number]- Manual therapy, Patient/Family education, Taping, Dry Needling, Joint mobilization, Spinal mobilization, Scar mobilization, Cryotherapy, and Moist heat  PLAN FOR NEXT SESSION: continued internal treatment in hooklying and reassess pelvic floor tension at rest, introduce pelvic floor contractions?, discuss use of dilators at home, introduce progressive downtraining stretches, manual to ribs and perineal body   Marni Sins, PT 06/11/2023, 5:06 PM

## 2023-06-18 ENCOUNTER — Encounter: Payer: Self-pay | Admitting: Hematology and Oncology

## 2023-06-19 ENCOUNTER — Inpatient Hospital Stay: Payer: 59 | Attending: Hematology and Oncology

## 2023-06-19 ENCOUNTER — Encounter: Payer: Self-pay | Admitting: Physical Therapy

## 2023-06-19 ENCOUNTER — Ambulatory Visit: Admitting: Physical Therapy

## 2023-06-19 ENCOUNTER — Inpatient Hospital Stay: Payer: 59

## 2023-06-19 ENCOUNTER — Encounter: Payer: Self-pay | Admitting: Hematology and Oncology

## 2023-06-19 ENCOUNTER — Inpatient Hospital Stay: Payer: 59 | Admitting: Hematology and Oncology

## 2023-06-19 VITALS — BP 111/64 | HR 76 | Temp 98.6°F | Resp 16

## 2023-06-19 DIAGNOSIS — Z9013 Acquired absence of bilateral breasts and nipples: Secondary | ICD-10-CM | POA: Diagnosis not present

## 2023-06-19 DIAGNOSIS — Z1732 Human epidermal growth factor receptor 2 negative status: Secondary | ICD-10-CM | POA: Insufficient documentation

## 2023-06-19 DIAGNOSIS — C50911 Malignant neoplasm of unspecified site of right female breast: Secondary | ICD-10-CM

## 2023-06-19 DIAGNOSIS — Z170 Estrogen receptor positive status [ER+]: Secondary | ICD-10-CM

## 2023-06-19 DIAGNOSIS — Z79899 Other long term (current) drug therapy: Secondary | ICD-10-CM | POA: Diagnosis not present

## 2023-06-19 DIAGNOSIS — Z1721 Progesterone receptor positive status: Secondary | ICD-10-CM | POA: Diagnosis not present

## 2023-06-19 DIAGNOSIS — C50411 Malignant neoplasm of upper-outer quadrant of right female breast: Secondary | ICD-10-CM | POA: Insufficient documentation

## 2023-06-19 DIAGNOSIS — R232 Flushing: Secondary | ICD-10-CM | POA: Diagnosis not present

## 2023-06-19 DIAGNOSIS — Z5111 Encounter for antineoplastic chemotherapy: Secondary | ICD-10-CM | POA: Insufficient documentation

## 2023-06-19 LAB — CBC WITH DIFFERENTIAL (CANCER CENTER ONLY)
Abs Immature Granulocytes: 0.01 10*3/uL (ref 0.00–0.07)
Basophils Absolute: 0.1 10*3/uL (ref 0.0–0.1)
Basophils Relative: 3 %
Eosinophils Absolute: 0.2 10*3/uL (ref 0.0–0.5)
Eosinophils Relative: 5 %
HCT: 35.8 % — ABNORMAL LOW (ref 36.0–46.0)
Hemoglobin: 12.1 g/dL (ref 12.0–15.0)
Immature Granulocytes: 0 %
Lymphocytes Relative: 24 %
Lymphs Abs: 0.7 10*3/uL (ref 0.7–4.0)
MCH: 32.2 pg (ref 26.0–34.0)
MCHC: 33.8 g/dL (ref 30.0–36.0)
MCV: 95.2 fL (ref 80.0–100.0)
Monocytes Absolute: 0.3 10*3/uL (ref 0.1–1.0)
Monocytes Relative: 11 %
Neutro Abs: 1.7 10*3/uL (ref 1.7–7.7)
Neutrophils Relative %: 57 %
Platelet Count: 261 10*3/uL (ref 150–400)
RBC: 3.76 MIL/uL — ABNORMAL LOW (ref 3.87–5.11)
RDW: 13 % (ref 11.5–15.5)
WBC Count: 3 10*3/uL — ABNORMAL LOW (ref 4.0–10.5)
nRBC: 0 % (ref 0.0–0.2)

## 2023-06-19 LAB — CMP (CANCER CENTER ONLY)
ALT: 29 U/L (ref 0–44)
AST: 29 U/L (ref 15–41)
Albumin: 4.5 g/dL (ref 3.5–5.0)
Alkaline Phosphatase: 59 U/L (ref 38–126)
Anion gap: 9 (ref 5–15)
BUN: 12 mg/dL (ref 6–20)
CO2: 27 mmol/L (ref 22–32)
Calcium: 9.4 mg/dL (ref 8.9–10.3)
Chloride: 102 mmol/L (ref 98–111)
Creatinine: 1.08 mg/dL — ABNORMAL HIGH (ref 0.44–1.00)
GFR, Estimated: >60 mL/min (ref >60)
Glucose, Bld: 96 mg/dL (ref 70–99)
Potassium: 4 mmol/L (ref 3.5–5.1)
Sodium: 138 mmol/L (ref 135–145)
Total Bilirubin: 0.7 mg/dL (ref 0.0–1.2)
Total Protein: 7.3 g/dL (ref 6.5–8.1)

## 2023-06-19 MED ORDER — GOSERELIN ACETATE 3.6 MG ~~LOC~~ IMPL
3.6000 mg | DRUG_IMPLANT | Freq: Once | SUBCUTANEOUS | Status: AC
  Administered 2023-06-19: 3.6 mg via SUBCUTANEOUS
  Filled 2023-06-19: qty 3.6

## 2023-06-26 ENCOUNTER — Ambulatory Visit: Attending: Hematology and Oncology | Admitting: Physical Therapy

## 2023-06-26 DIAGNOSIS — M6281 Muscle weakness (generalized): Secondary | ICD-10-CM | POA: Diagnosis present

## 2023-06-26 DIAGNOSIS — R293 Abnormal posture: Secondary | ICD-10-CM | POA: Insufficient documentation

## 2023-06-26 DIAGNOSIS — R279 Unspecified lack of coordination: Secondary | ICD-10-CM | POA: Insufficient documentation

## 2023-06-26 NOTE — Patient Instructions (Addendum)
 How to start using a pelvic floor dilator at home: do this every other day or 3-4x/wk, whatever works best for your schedule  Start with positioning yourself in a comfortable stance:  try sidelying with reaching from behind or in the front Try reclined on a bed or on the floor with pillows supporting your hips and back  Try standing with one foot on an elevated surface  Place water based lubricant on yourself at the vaginal opening and on the dilator (thoroughly)  Start with 3 deep belly breaths focusing on relaxation of the pelvic floor  When you feel relaxed and ready, place the dilator at the entrance to the vaginal canal. Take a deep inhale as you slowly glide the dilator into the vaginal canal. If there is pain, stop here and take deep breaths until this pain decreases. If this pain does not decrease, continue this breathing for 5 full minutes. If this pain does decrease, try slowly increasing the depth of the dilator as you inhale.  Use your smallest dilator in this fashion for 1 full week (or until your next visit) and only use your dilators for a maximum of 10 minutes.    Abdominal scar tissue mobilization techniques:  You can perform a short scar tissue mobilization massage at home 3x/wk to see noticeable changes in the scar tissue. Do this for 5 minutes at a time, using firm but comfortable pressure  You can use lotion if you'd like, but you do not have to Techniques: Stroking the scar side to side  Stroking the scar up and down  Doing small circles over the scar  Flossing the scar  Sustained pressure on the scar or under the ribs while you are breathing

## 2023-06-26 NOTE — Therapy (Signed)
 OUTPATIENT PHYSICAL THERAPY FEMALE PELVIC TREATMENT   Patient Name: SAIDI SANTACROCE MRN: [id number] DOB:08-07-1975, 48 y.o., female Today's Date: 06/26/2023  END OF SESSION:  PT End of Session - 06/26/23 1012     Visit Number 2    Number of Visits 12    Date for PT Re-Evaluation 10/12/23    Authorization Type [employer]    PT Start Time 0930    PT Stop Time 1015    PT Time Calculation (min) 45 min    Activity Tolerance Patient tolerated treatment well    Behavior During Therapy WFL for tasks assessed/performed              Past Medical History:  Diagnosis Date   Allergy    Anal fissure    Anemia    Asthma    Breast cancer (HCC)    twice   Cardiomyopathy (HCC)    Chronic headaches    Delivery normal 11/2008   Family history of colon cancer    Family history of leukemia    Family history of melanoma    Family history of ovarian cancer    Heart murmur    Kidney stone    x of   PONV (postoperative nausea and vomiting)    S/P tonsillectomy    2000   Past Surgical History:  Procedure Laterality Date   BREAST BIOPSY Right 09/12/2022   US  RT BREAST BX W LOC DEV 1ST LESION IMG BX SPEC US  GUIDE 09/12/2022 [facility]   BREAST BIOPSY Left 01/18/2023   US  LT BREAST BX W LOC DEV 1ST LESION IMG BX SPEC US  GUIDE 01/18/2023 [facility]   BREAST CYST EXCISION Right 09/26/2022   Procedure: RIGHT BREAST CANCER RECURRENT EXCISION;  Surgeon: Enid Harry, MD;  Location: [facility];  Service: General;  Laterality: Right;  LMA   BREAST RECONSTRUCTION WITH PLACEMENT OF TISSUE EXPANDER AND ALLODERM Bilateral 12/06/2020   Procedure: BREAST RECONSTRUCTION WITH PLACEMENT OF TISSUE EXPANDER AND ALLODERM;  Surgeon: Alger Infield, MD;  Location: [facility];  Service: Plastics;  Laterality: Bilateral;   LEEP     MASTECTOMY W/ SENTINEL NODE BIOPSY Right 12/06/2020   Procedure: RIGHT MASTECTOMY WITH RIGHT AXILLARY SENTINEL LYMPH NODE BIOPSY;  Surgeon:  Enid Harry, MD;  Location: [facility];  Service: General;  Laterality: Right;   REMOVAL OF BILATERAL TISSUE EXPANDERS WITH PLACEMENT OF BILATERAL BREAST IMPLANTS Bilateral 09/15/2021   Procedure: REMOVAL OF BILATERAL TISSUE EXPANDERS WITH PLACEMENT OF BILATERAL BREAST IMPLANTS;  Surgeon: Alger Infield, MD;  Location: [facility];  Service: Plastics;  Laterality: Bilateral;   tonsil removal  01/22/1998   TOTAL MASTECTOMY Left 12/06/2020   Procedure: LEFT TOTAL MASTECTOMY;  Surgeon: Enid Harry, MD;  Location: [facility];  Service: General;  Laterality: Left;   Patient Active Problem List   Diagnosis Date Noted   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   Malignant neoplasm of upper-outer quadrant of right breast in female, estrogen receptor positive (HCC) 12/12/2020   S/P bilateral mastectomy 12/06/2020   Genetic testing 11/17/2020   Family history of breast cancer 11/02/2020   Family history of ovarian cancer 11/02/2020   Family history of colon cancer 11/02/2020   Family history of melanoma 11/02/2020   Family history of leukemia 11/02/2020   Bilateral breast cancer (HCC) 11/01/2020   LLQ abdominal pain 04/06/2011    PCP: Faustina Hood, MD  REFERRING PROVIDER: Cameron Cea, MD   REFERRING  DIAG: Z78.0 (ICD-10-CM) - Post-menopausal  THERAPY DIAG:  Unspecified lack of coordination  Rationale for Evaluation and Treatment: Rehabilitation  ONSET DATE: 2010  SUBJECTIVE:                                                                                                                                                                                           SUBJECTIVE STATEMENT: Patient reports that she is doing well today, has been compliant with HEP. Stretches felt good at home. No urinary issues at all to report. Bowel movements have been good - she has had two bouts of hard stool that caused slight  bleeding while pooping. She is currently taking a medication that helps with her fissures. Dryness has been present - good clean love brand of moisturizer is burning to her.   From eval: Patient reports that she had breast cancer in 2022 and a local reoccurrence in Sept 2024, which led to her being placed in medical menopause in Oct of 2024. Since then, she has experienced vaginal dryness and tightness in the pelvic floor, contributing to hemorrhoids and painful intercourse. In 2010 and 2014 she had vaginal deliveries, both with tearing. She has seen a GI specialist and plans to have a colonoscopy soon. She has seen a pelvic PT for 3 visits and from that experience she has dilators (6 different sizes - has not used these yet) and cups. She has had a double mastectomy and has lots of scar tissue under the ribs, which she has used the cups for in the past. She feels like her pelvic floor gets crampy every now and again. No bladder related issues to report. She has a vibration plate and rebounder at home. She sees a chiro 1x/wk consistently.  Fluid intake: drinks a lot of water - tries to get 60-90oz   PAIN:  Are you having pain? No NPRS scale: 0/10  PRECAUTIONS: None  RED FLAGS: None   WEIGHT BEARING RESTRICTIONS: No  FALLS:  Has patient fallen in last 6 months? No  OCCUPATION: works currently with college students   ACTIVITY LEVEL : works out 5-6x/wk at [employer], walks 1 mile most days  PLOF: Independent with basic ADLs  PATIENT GOALS: decrease pelvic floor tension at rest and hemorrhoid discomfort, decrease scar tissue restriction present from mastectomy surgery and from vaginal tearing during deliveries   PERTINENT HISTORY:  LEEP vaginally 2015, double mastectomy, 3 endometrial polyps all benign in 09/2022   BOWEL MOVEMENT: Pain with bowel movement: No - will only have pain with hemorrhoids when they are present  Type of bowel movement:Type (Bristol Stool Scale) 3-4, Frequency  1-2x/day, Strain  no, and Splinting no Fully empty rectum: No  Leakage: No Pads: No Fiber supplement/laxative Yes   URINATION: Pain with urination: No Fully empty bladder: Yes:   Stream: Strong Urgency: Yes  Frequency: within normal limits  Leakage: none Pads: No  INTERCOURSE: not currently sexually active due to pain - feels like barbwire when she tries to have intercourse    Ability to have vaginal penetration No  Pain with intercourse: Pain Interrupts Intercourse DrynessYes  Marinoff Scale: 3/3  PREGNANCY: Vaginal deliveries 2 Tearing Yes: with both deliveries, first tear was lateral in nature   PROLAPSE: None  OBJECTIVE:  Note: Objective measures were completed at Evaluation unless otherwise noted.  PATIENT SURVEYS:  PFIQ-7: 19  COGNITION: Overall cognitive status: Within functional limits for tasks assessed     SENSATION: Light touch: Appears intact  LUMBAR SPECIAL TESTS:  Single leg stance test: Positive  FUNCTIONAL TESTS:  Squat test: mild dynamic knee valgus with loading, no significant lumbopelvic stiffness present   GAIT: Assistive device utilized: None Comments: mild trendelenburg gait pattern with ambulation   POSTURE: rounded shoulders, forward head, and increased thoracic kyphosis  LUMBARAROM/PROM:  A/PROM A/PROM  eval  Flexion Within normal limits   Extension Within normal limits   Right lateral flexion Within normal limits   Left lateral flexion Within normal limits   Right rotation Within normal limits   Left rotation Within normal limits    (Blank rows = not tested)  LOWER EXTREMITY ROM: within functional limits   LOWER EXTREMITY MMT: 4/5 bilateral knees and hips grossly  PALPATION:   General: no significant tenderness to palpation of bilateral adductors or hip flexors   Pelvic Alignment: within normal limits   Abdominal: upper chest breathing and abdominal bracing at rest, decreased lower rib excursion with inhalation                  External Perineal Exam: dryness noted with mild clitoral hood mobility restriction present                              Internal Pelvic Floor: Patient demonstrates normal tone throughout the superficial and deep pelvic floor muscles bilaterally. When patient contracts her pelvic floor, muscle tension remains throughout the pelvic floor without the patient doing so on her own. She demonstrates stiffness throughout superficial and deep pelvic floor, most likely due to increased muscle tension at rest. With deep diaphragmatic breathing in hooklying, patient is able to actively lengthen/relax her pelvic floor with inhalation. No pain following today's examination.    Patient confirms identification and approves PT to assess internal pelvic floor and treatment Yes No emotional/communication barriers or cognitive limitation. Patient is motivated to learn. Patient understands and agrees with treatment goals and plan. PT explains patient will be examined in standing, sitting, and lying down to see how their muscles and joints work. When they are ready, they will be asked to remove their underwear so PT can examine their perineum. The patient is also given the option of providing their own chaperone as one is not provided in our facility. The patient also has the right and is explained the right to defer or refuse any part of the evaluation or treatment including the internal exam. With the patient's consent, PT will use one gloved finger to gently assess the muscles of the pelvic floor, seeing how well it contracts and relaxes and if there is muscle symmetry. After, the patient will  get dressed and PT and patient will discuss exam findings and plan of care. PT and patient discuss plan of care, schedule, attendance policy and HEP activities.  PELVIC MMT:   MMT eval  Vaginal 4/5, 5 quick flicks, 5 second hold  Internal Anal Sphincter   External Anal Sphincter   Puborectalis   Diastasis Recti   (Blank  rows = not tested)  TONE: Increased following pelvic floor muscle contraction  PROLAPSE: N/A   TODAY'S TREATMENT:                                                                                                                              DATE:   EVAL Examination completed, findings reviewed, pt educated on POC, HEP, and self care. Pt motivated to participate in PT and agreeable to attempt recommendations.   Neuro re-ed: Hooklying diaphragmatic breathing with pelvic floor lengthening during inhalation 2x10  Supine butterfly stretch + diaphragmatic breathing 2x33min  Childs pose + diaphragmatic breathing 2x51min  Self care:  Relative anatomy and the connection between the diaphragm and pelvic floor, silicone based lubricant samples and education, vaginal moisturizer education  06/26/23:  Manual therapy  Cupping over lower ribs and abdominal scars  STM over rib restriction from scar tissue: sustained pressure, sweeping techniques  Neuro re-ed: Hooklying diaphragmatic breathing with pelvic floor lengthening during inhalation 2x10  Supine butterfly stretch + diaphragmatic breathing 2x28min  Childs pose + diaphragmatic breathing 2x27min  Self care:  Relative anatomy and the connection between the diaphragm and pelvic floor, silicone based lubricant samples and education, vaginal moisturizer education Dilator size 1 education and use at home: water based lubricant, positioning, technique, and frequency   PATIENT EDUCATION:  Education details: Relative anatomy and the connection between the diaphragm and pelvic floor, silicone based lubricant samples and education, vaginal moisturizer education Person educated: Patient Education method: Explanation, Demonstration, Tactile cues, Verbal cues, and Handouts Education comprehension: verbalized understanding, returned demonstration, verbal cues required, tactile cues required, and needs further education  HOME EXERCISE PROGRAM: Access Code:  [email or web address] URL: [email or web address] Date: 06/11/2023 Prepared by: Robbin Chill  Exercises - Supine Diaphragmatic Breathing  - 1 x daily - 7 x weekly - 3 sets - 10 reps - Supine Butterfly Groin Stretch  - 1 x daily - 7 x weekly - 3 sets - hold - Child's Pose Stretch  - 1 x daily - 7 x weekly - 3 sets - hold  ASSESSMENT:  CLINICAL IMPRESSION: Patient is a 48 y.o. female  who was seen today for physical therapy treatment for post-menopausal related care. Since undergoing treatment for breast cancer in 2022 and being medically induced into menopause, patient has been experiencing vaginal dryness, tightness, pain during intercourse, and hemorrhoids. Today we introduced dilator training with size 1 dilator and provided patient education on techniques for this at home. Manual therapy applied to bilateral lower ribs due to scar tissue restriction and patient education on cupping/STM techniques at home.  Overall, patient tolerated session well and Pt would benefit from additional PT to further address deficits.    OBJECTIVE IMPAIRMENTS: decreased coordination, decreased endurance, decreased mobility, decreased ROM, decreased strength, and pain.   ACTIVITY LIMITATIONS: vaginal penetration during intercourse/pelvic examinations   PARTICIPATION LIMITATIONS: N/A  PERSONAL FACTORS: Age, Past/current experiences, and Time since onset of injury/illness/exacerbation are also affecting patient's functional outcome.   REHAB POTENTIAL: Good  CLINICAL DECISION MAKING: Stable/uncomplicated  EVALUATION COMPLEXITY: Low   GOALS: Goals reviewed with patient? Yes  SHORT TERM GOALS: Target date: 07/09/2023  Pt will be independent with HEP.  Baseline: Goal status: INITIAL  2.  Pt will be independent with diaphragmatic breathing and down training activities in order to improve pelvic floor relaxation. Baseline:  Goal status: INITIAL  3.  Pt will be independent with use of squatty  potty, relaxed toileting mechanics, and improved bowel movement techniques in order to increase ease of bowel movements and complete evacuation.   Baseline:  Goal status: INITIAL  LONG TERM GOALS: Target date: 12/12/2023  Pt will be independent with advanced HEP.  Baseline:  Goal status: INITIAL  2.  Pt will be ind with using dilators for reducing pelvic floor tension and pain management to allow patient to return to vaginal penetration without significant pain to improve quality of life and relationship with partner.  Baseline:  Goal status: INITIAL  3.  Pt will report less than 2/10 pain with vaginal penetration in order to improve intimate relationship with partner and allow patient to participate in pelvic examinations with little to no pain. Baseline:  Goal status: INITIAL  4.  Pt will be able to correctly perform diaphragmatic breathing and appropriate pressure management in order to prevent vaginal wall laxity and improve pelvic floor A/ROM at rest so that she is able to utilize full pelvic floor active range of motion.  Baseline:  Goal status: INITIAL  PLAN:  PT FREQUENCY: 1x/week  PT DURATION: 12 weeks  PLANNED INTERVENTIONS: 97110-Therapeutic exercises, 97530- Therapeutic activity, 97112- Neuromuscular re-education, 97535- Self Care, 95621- Manual therapy, Patient/Family education, Taping, Dry Needling, Joint mobilization, Spinal mobilization, Scar mobilization, Cryotherapy, and Moist heat  PLAN FOR NEXT SESSION: continued internal treatment in hooklying and reassess pelvic floor tension at rest, introduce pelvic floor contractions?, discuss use of dilators at home, introduce progressive downtraining stretches, manual to ribs and perineal body   Marni Sins, PT 06/26/2023, 10:13 AM

## 2023-07-04 ENCOUNTER — Ambulatory Visit: Payer: Self-pay | Admitting: Physical Therapy

## 2023-07-04 DIAGNOSIS — R279 Unspecified lack of coordination: Secondary | ICD-10-CM | POA: Diagnosis not present

## 2023-07-04 DIAGNOSIS — M6281 Muscle weakness (generalized): Secondary | ICD-10-CM

## 2023-07-04 NOTE — Therapy (Signed)
 OUTPATIENT PHYSICAL THERAPY FEMALE PELVIC TREATMENT   Patient Name: Kaitlin [name] MRN: [id number] DOB:Jun 08, 1975, 48 y.o., female Today's Date: 07/04/2023  END OF SESSION:  PT End of Session - 07/04/23 0930     Visit Number 3    Number of Visits 12    Date for PT Re-Evaluation 10/12/23    Authorization Type [employer]    PT Start Time 0845    PT Stop Time 0930    PT Time Calculation (min) 45 min    Activity Tolerance Patient tolerated treatment well    Behavior During Therapy WFL for tasks assessed/performed            Past Medical History:  Diagnosis Date   Allergy    Anal fissure    Anemia    Asthma    Breast cancer (HCC)    twice   Cardiomyopathy (HCC)    Chronic headaches    Delivery normal 11/2008   Family history of colon cancer    Family history of leukemia    Family history of melanoma    Family history of ovarian cancer    Heart murmur    Kidney stone    x of   PONV (postoperative nausea and vomiting)    S/P tonsillectomy    2000   Past Surgical History:  Procedure Laterality Date   BREAST BIOPSY Right 09/12/2022   US  RT BREAST BX W LOC DEV 1ST LESION IMG BX SPEC US  GUIDE 09/12/2022 [facility]   BREAST BIOPSY Left 01/18/2023   US  LT BREAST BX W LOC DEV 1ST LESION IMG BX SPEC US  GUIDE 01/18/2023 [facility]   BREAST CYST EXCISION Right 09/26/2022   Procedure: RIGHT BREAST CANCER RECURRENT EXCISION;  Surgeon: Enid Harry, MD;  Location: [facility];  Service: General;  Laterality: Right;  LMA   BREAST RECONSTRUCTION WITH PLACEMENT OF TISSUE EXPANDER AND ALLODERM Bilateral 12/06/2020   Procedure: BREAST RECONSTRUCTION WITH PLACEMENT OF TISSUE EXPANDER AND ALLODERM;  Surgeon: Alger Infield, MD;  Location: [facility];  Service: Plastics;  Laterality: Bilateral;   LEEP     MASTECTOMY W/ SENTINEL NODE BIOPSY Right 12/06/2020   Procedure: RIGHT MASTECTOMY WITH RIGHT AXILLARY SENTINEL LYMPH NODE BIOPSY;  Surgeon:  Enid Harry, MD;  Location: [facility];  Service: General;  Laterality: Right;   REMOVAL OF BILATERAL TISSUE EXPANDERS WITH PLACEMENT OF BILATERAL BREAST IMPLANTS Bilateral 09/15/2021   Procedure: REMOVAL OF BILATERAL TISSUE EXPANDERS WITH PLACEMENT OF BILATERAL BREAST IMPLANTS;  Surgeon: Alger Infield, MD;  Location: [facility];  Service: Plastics;  Laterality: Bilateral;   tonsil removal  01/22/1998   TOTAL MASTECTOMY Left 12/06/2020   Procedure: LEFT TOTAL MASTECTOMY;  Surgeon: Enid Harry, MD;  Location: [facility];  Service: General;  Laterality: Left;   Patient Active Problem List   Diagnosis Date Noted   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   Malignant neoplasm of upper-outer quadrant of right breast in female, estrogen receptor positive (HCC) 12/12/2020   S/P bilateral mastectomy 12/06/2020   Genetic testing 11/17/2020   Family history of breast cancer 11/02/2020   Family history of ovarian cancer 11/02/2020   Family history of colon cancer 11/02/2020   Family history of melanoma 11/02/2020   Family history of leukemia 11/02/2020   Bilateral breast cancer (HCC) 11/01/2020   LLQ abdominal pain 04/06/2011    PCP: Faustina Hood, MD  REFERRING PROVIDER: Cameron Cea, MD   REFERRING DIAG: Z78.0 (  ICD-10-CM) - Post-menopausal  THERAPY DIAG:  Unspecified lack of coordination  Muscle weakness (generalized)  Rationale for Evaluation and Treatment: Rehabilitation  ONSET DATE: 2010  SUBJECTIVE:                                                                                                                                                                                           SUBJECTIVE STATEMENT: Patient reports that she is doing well today. She has used the dilator 2 times - smallest size dilator and she had no pain with this. Some soreness afterwards in the day. The good clean love moisturizer  is causing some burning in nature. Bowel movements have been good recently - she has been using the fissure compound and this seems to be helping.   From eval: Patient reports that she had breast cancer in 2022 and a local reoccurrence in Sept 2024, which led to her being placed in medical menopause in Oct of 2024. Since then, she has experienced vaginal dryness and tightness in the pelvic floor, contributing to hemorrhoids and painful intercourse. In 2010 and 2014 she had vaginal deliveries, both with tearing. She has seen a GI specialist and plans to have a colonoscopy soon. She has seen a pelvic PT for 3 visits and from that experience she has dilators (6 different sizes - has not used these yet) and cups. She has had a double mastectomy and has lots of scar tissue under the ribs, which she has used the cups for in the past. She feels like her pelvic floor gets crampy every now and again. No bladder related issues to report. She has a vibration plate and rebounder at home. She sees a chiro 1x/wk consistently.  Fluid intake: drinks a lot of water - tries to get 60-90oz   PAIN:  Are you having pain? No NPRS scale: 0/10  PRECAUTIONS: None  RED FLAGS: None   WEIGHT BEARING RESTRICTIONS: No  FALLS:  Has patient fallen in last 6 months? No  OCCUPATION: works currently with college students   ACTIVITY LEVEL : works out 5-6x/wk at [employer], walks 1 mile most days  PLOF: Independent with basic ADLs  PATIENT GOALS: decrease pelvic floor tension at rest and hemorrhoid discomfort, decrease scar tissue restriction present from mastectomy surgery and from vaginal tearing during deliveries   PERTINENT HISTORY:  LEEP vaginally 2015, double mastectomy, 3 endometrial polyps all benign in 09/2022   BOWEL MOVEMENT: Pain with bowel movement: No - will only have pain with hemorrhoids when they are present  Type of bowel movement:Type (Bristol Stool Scale) 3-4, Frequency 1-2x/day, Strain no, and  Splinting no Fully empty  rectum: No  Leakage: No Pads: No Fiber supplement/laxative Yes   URINATION: Pain with urination: No Fully empty bladder: Yes:   Stream: Strong Urgency: Yes  Frequency: within normal limits  Leakage: none Pads: No  INTERCOURSE: not currently sexually active due to pain - feels like barbwire when she tries to have intercourse    Ability to have vaginal penetration No  Pain with intercourse: Pain Interrupts Intercourse DrynessYes  Marinoff Scale: 3/3  PREGNANCY: Vaginal deliveries 2 Tearing Yes: with both deliveries, first tear was lateral in nature   PROLAPSE: None  OBJECTIVE:  Note: Objective measures were completed at Evaluation unless otherwise noted.  PATIENT SURVEYS:  PFIQ-7: 66  COGNITION: Overall cognitive status: Within functional limits for tasks assessed     SENSATION: Light touch: Appears intact  LUMBAR SPECIAL TESTS:  Single leg stance test: Positive  FUNCTIONAL TESTS:  Squat test: mild dynamic knee valgus with loading, no significant lumbopelvic stiffness present   GAIT: Assistive device utilized: None Comments: mild trendelenburg gait pattern with ambulation   POSTURE: rounded shoulders, forward head, and increased thoracic kyphosis  LUMBARAROM/PROM:  A/PROM A/PROM  eval  Flexion Within normal limits   Extension Within normal limits   Right lateral flexion Within normal limits   Left lateral flexion Within normal limits   Right rotation Within normal limits   Left rotation Within normal limits    (Blank rows = not tested)  LOWER EXTREMITY ROM: within functional limits   LOWER EXTREMITY MMT: 4/5 bilateral knees and hips grossly  PALPATION:   General: no significant tenderness to palpation of bilateral adductors or hip flexors   Pelvic Alignment: within normal limits   Abdominal: upper chest breathing and abdominal bracing at rest, decreased lower rib excursion with inhalation                 External  Perineal Exam: dryness noted with mild clitoral hood mobility restriction present                              Internal Pelvic Floor: Patient demonstrates normal tone throughout the superficial and deep pelvic floor muscles bilaterally. When patient contracts her pelvic floor, muscle tension remains throughout the pelvic floor without the patient doing so on her own. She demonstrates stiffness throughout superficial and deep pelvic floor, most likely due to increased muscle tension at rest. With deep diaphragmatic breathing in hooklying, patient is able to actively lengthen/relax her pelvic floor with inhalation. No pain following today's examination.    Patient confirms identification and approves PT to assess internal pelvic floor and treatment Yes No emotional/communication barriers or cognitive limitation. Patient is motivated to learn. Patient understands and agrees with treatment goals and plan. PT explains patient will be examined in standing, sitting, and lying down to see how their muscles and joints work. When they are ready, they will be asked to remove their underwear so PT can examine their perineum. The patient is also given the option of providing their own chaperone as one is not provided in our facility. The patient also has the right and is explained the right to defer or refuse any part of the evaluation or treatment including the internal exam. With the patient's consent, PT will use one gloved finger to gently assess the muscles of the pelvic floor, seeing how well it contracts and relaxes and if there is muscle symmetry. After, the patient will get dressed and PT and patient  will discuss exam findings and plan of care. PT and patient discuss plan of care, schedule, attendance policy and HEP activities.  PELVIC MMT:   MMT eval  Vaginal 4/5, 5 quick flicks, 5 second hold  Internal Anal Sphincter   External Anal Sphincter   Puborectalis   Diastasis Recti   (Blank rows = not  tested)  TONE: Increased following pelvic floor muscle contraction  PROLAPSE: N/A   TODAY'S TREATMENT:                                                                                                                              DATE:   EVAL Examination completed, findings reviewed, pt educated on POC, HEP, and self care. Pt motivated to participate in PT and agreeable to attempt recommendations.   Neuro re-ed: Hooklying diaphragmatic breathing with pelvic floor lengthening during inhalation 2x10  Supine butterfly stretch + diaphragmatic breathing 2x56min  Childs pose + diaphragmatic breathing 2x16min  Self care:  Relative anatomy and the connection between the diaphragm and pelvic floor, silicone based lubricant samples and education, vaginal moisturizer education  06/26/23:  Manual therapy  Cupping over lower ribs and abdominal scars  STM over rib restriction from scar tissue: sustained pressure, sweeping techniques  Neuro re-ed: Hooklying diaphragmatic breathing with pelvic floor lengthening during inhalation 2x10  Supine butterfly stretch + diaphragmatic breathing 2x53min  Childs pose + diaphragmatic breathing 2x25min  Self care:  Relative anatomy and the connection between the diaphragm and pelvic floor, silicone based lubricant samples and education, vaginal moisturizer education Dilator size 1 education and use at home: water based lubricant, positioning, technique, and frequency   07/04/23:  Neuro re-ed: Hooklying diaphragmatic breathing with pelvic floor lengthening during inhalation and shortening during exhalation 2x10  Bridge + adductor ball squeeze + diaphragmatic breathing 2x10  Sidelying clamshell + reverse clamshell + diaphragmatic breathing 2x10  Sit to stand + adductor ball squeeze + diaphragmatic breathing 2x10  Therapeutic exercise:  Supine butterfly stretch + diaphragmatic breathing 2x55min  Childs pose + diaphragmatic breathing 2x63min  Single knee to chest stretch  + diaphragmatic breathing 2x83min  Self care:  Relative anatomy and the connection between the diaphragm and pelvic floor, silicone based lubricant samples and education, vaginal moisturizer education Dilator size 1-2 education and use at home: water based lubricant, positioning, technique, and frequency   PATIENT EDUCATION:  Education details: Relative anatomy and the connection between the diaphragm and pelvic floor, silicone based lubricant samples and education, vaginal moisturizer education Person educated: Patient Education method: Explanation, Demonstration, Tactile cues, Verbal cues, and Handouts Education comprehension: verbalized understanding, returned demonstration, verbal cues required, tactile cues required, and needs further education  HOME EXERCISE PROGRAM: Access Code: [email or web address] URL: [email or web address] Date: 07/04/2023 Prepared by: Robbin Chill  Exercises - Supine Pelvic Floor Contraction  - 1 x daily - 7 x weekly - 2 sets - 10 reps - Supine Bridge with Mini Swiss Ball Between Knees  - 1 x daily -  7 x weekly - 2 sets - 10 reps - Clamshell  - 1 x daily - 7 x weekly - 2 sets - 8 reps - Sidelying Reverse Clamshell  - 1 x daily - 7 x weekly - 2 sets - 8 reps - Sit to Stand with Ball Between Knees  - 1 x daily - 7 x weekly - 2 sets - 8 reps - Supine Butterfly Groin Stretch  - 1 x daily - 7 x weekly - 3 sets - hold - Child's Pose Stretch  - 1 x daily - 7 x weekly - 3 sets - hold - Supine Single Knee to Chest Stretch  - 1 x daily - 7 x weekly - 3 sets - hold  ASSESSMENT:  CLINICAL IMPRESSION: Patient is a 48 y.o. female  who was seen today for physical therapy treatment for post-menopausal related care. Since undergoing treatment for breast cancer in 2022 and being medically induced into menopause, patient has been experiencing vaginal dryness, tightness, pain during intercourse, and hemorrhoids. Today we introduced dilator training with size 1 and  2 dilators and provided patient education on techniques for this at home. Introduction to pelvic floor activation and she had no pain with this. We introduced hip and glute strengthening today and she could feel a slight pulling sensation at the anal region. Downtraining stretches at end of session decreased this slightly. Overall, patient tolerated session well and Pt would benefit from additional PT to further address deficits.    OBJECTIVE IMPAIRMENTS: decreased coordination, decreased endurance, decreased mobility, decreased ROM, decreased strength, and pain.   ACTIVITY LIMITATIONS: vaginal penetration during intercourse/pelvic examinations   PARTICIPATION LIMITATIONS: N/A  PERSONAL FACTORS: Age, Past/current experiences, and Time since onset of injury/illness/exacerbation are also affecting patient's functional outcome.   REHAB POTENTIAL: Good  CLINICAL DECISION MAKING: Stable/uncomplicated  EVALUATION COMPLEXITY: Low   GOALS: Goals reviewed with patient? Yes  SHORT TERM GOALS: Target date: 07/09/2023  Pt will be independent with HEP.  Baseline: Goal status: INITIAL  2.  Pt will be independent with diaphragmatic breathing and down training activities in order to improve pelvic floor relaxation. Baseline:  Goal status: INITIAL  3.  Pt will be independent with use of squatty potty, relaxed toileting mechanics, and improved bowel movement techniques in order to increase ease of bowel movements and complete evacuation.   Baseline:  Goal status: INITIAL  LONG TERM GOALS: Target date: 12/12/2023  Pt will be independent with advanced HEP.  Baseline:  Goal status: INITIAL  2.  Pt will be ind with using dilators for reducing pelvic floor tension and pain management to allow patient to return to vaginal penetration without significant pain to improve quality of life and relationship with partner.  Baseline:  Goal status: INITIAL  3.  Pt will report less than 2/10 pain with  vaginal penetration in order to improve intimate relationship with partner and allow patient to participate in pelvic examinations with little to no pain. Baseline:  Goal status: INITIAL  4.  Pt will be able to correctly perform diaphragmatic breathing and appropriate pressure management in order to prevent vaginal wall laxity and improve pelvic floor A/ROM at rest so that she is able to utilize full pelvic floor active range of motion.  Baseline:  Goal status: INITIAL  PLAN:  PT FREQUENCY: 1x/week  PT DURATION: 12 weeks  PLANNED INTERVENTIONS: 97110-Therapeutic exercises, 97530- Therapeutic activity, [id number]- Neuromuscular re-education, 97535- Self Care, [id number]- Manual therapy, Patient/Family education, Taping, Dry Needling, Joint  mobilization, Spinal mobilization, Scar mobilization, Cryotherapy, and Moist heat  PLAN FOR NEXT SESSION: continued internal treatment in hooklying and reassess pelvic floor tension at rest, introduce pelvic floor contractions?, discuss use of dilators at home, introduce progressive downtraining stretches, manual to ribs and perineal body   Marni Sins, PT 07/04/2023, 9:30 AM

## 2023-07-04 NOTE — Patient Instructions (Signed)
 Before your next PT session, please try the following: Use your size 1 dilator 1 time successfully (without pain)  The next two attempts, try using your second size dilator as long as there is no pain. If you have pain when trying this, revert back to dilator size 1

## 2023-07-08 ENCOUNTER — Encounter: Payer: Self-pay | Admitting: Internal Medicine

## 2023-07-11 ENCOUNTER — Ambulatory Visit: Payer: Self-pay | Admitting: Physical Therapy

## 2023-07-11 DIAGNOSIS — R279 Unspecified lack of coordination: Secondary | ICD-10-CM

## 2023-07-11 DIAGNOSIS — R293 Abnormal posture: Secondary | ICD-10-CM

## 2023-07-11 DIAGNOSIS — M6281 Muscle weakness (generalized): Secondary | ICD-10-CM

## 2023-07-11 NOTE — Therapy (Signed)
 OUTPATIENT PHYSICAL THERAPY FEMALE PELVIC TREATMENT   Patient Name: Kaitlin [name] MRN: [id number] DOB:March 11, 1975, 48 y.o., female Today's Date: 07/11/2023  END OF SESSION:  PT End of Session - 07/11/23 1625     Visit Number 4    Number of Visits 12    Date for PT Re-Evaluation 10/12/23    Authorization Type [employer]    PT Start Time 0200    PT Stop Time 0245    PT Time Calculation (min) 45 min    Activity Tolerance Patient tolerated treatment well    Behavior During Therapy WFL for tasks assessed/performed             Past Medical History:  Diagnosis Date   Allergy    Anal fissure    Anemia    Asthma    Breast cancer (HCC)    twice   Cardiomyopathy (HCC)    Chronic headaches    Delivery normal 11/2008   Family history of colon cancer    Family history of leukemia    Family history of melanoma    Family history of ovarian cancer    Heart murmur    Kidney stone    x of   PONV (postoperative nausea and vomiting)    S/P tonsillectomy    2000   Past Surgical History:  Procedure Laterality Date   BREAST BIOPSY Right 09/12/2022   US  RT BREAST BX W LOC DEV 1ST LESION IMG BX SPEC US  GUIDE 09/12/2022 [facility]   BREAST BIOPSY Left 01/18/2023   US  LT BREAST BX W LOC DEV 1ST LESION IMG BX SPEC US  GUIDE 01/18/2023 [facility]   BREAST CYST EXCISION Right 09/26/2022   Procedure: RIGHT BREAST CANCER RECURRENT EXCISION;  Surgeon: Enid Harry, MD;  Location: [facility];  Service: General;  Laterality: Right;  LMA   BREAST RECONSTRUCTION WITH PLACEMENT OF TISSUE EXPANDER AND ALLODERM Bilateral 12/06/2020   Procedure: BREAST RECONSTRUCTION WITH PLACEMENT OF TISSUE EXPANDER AND ALLODERM;  Surgeon: Alger Infield, MD;  Location: [facility];  Service: Plastics;  Laterality: Bilateral;   LEEP     MASTECTOMY W/ SENTINEL NODE BIOPSY Right 12/06/2020   Procedure: RIGHT MASTECTOMY WITH RIGHT AXILLARY SENTINEL LYMPH NODE BIOPSY;  Surgeon:  Enid Harry, MD;  Location: [facility];  Service: General;  Laterality: Right;   REMOVAL OF BILATERAL TISSUE EXPANDERS WITH PLACEMENT OF BILATERAL BREAST IMPLANTS Bilateral 09/15/2021   Procedure: REMOVAL OF BILATERAL TISSUE EXPANDERS WITH PLACEMENT OF BILATERAL BREAST IMPLANTS;  Surgeon: Alger Infield, MD;  Location: [facility];  Service: Plastics;  Laterality: Bilateral;   tonsil removal  01/22/1998   TOTAL MASTECTOMY Left 12/06/2020   Procedure: LEFT TOTAL MASTECTOMY;  Surgeon: Enid Harry, MD;  Location: [facility];  Service: General;  Laterality: Left;   Patient Active Problem List   Diagnosis Date Noted   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   Malignant neoplasm of upper-outer quadrant of right breast in female, estrogen receptor positive (HCC) 12/12/2020   S/P bilateral mastectomy 12/06/2020   Genetic testing 11/17/2020   Family history of breast cancer 11/02/2020   Family history of ovarian cancer 11/02/2020   Family history of colon cancer 11/02/2020   Family history of melanoma 11/02/2020   Family history of leukemia 11/02/2020   Bilateral breast cancer (HCC) 11/01/2020   LLQ abdominal pain 04/06/2011    PCP: Faustina Hood, MD  REFERRING PROVIDER: Cameron Cea, MD   REFERRING DIAG:  Z78.0 (ICD-10-CM) - Post-menopausal  THERAPY DIAG:  Unspecified lack of coordination  Muscle weakness (generalized)  Abnormal posture  Rationale for Evaluation and Treatment: Rehabilitation  ONSET DATE: 2010  SUBJECTIVE:                                                                                                                                                                                           SUBJECTIVE STATEMENT: Patient reports that she is doing well today. She has a colonoscopy next Tuesday and she is worried about the prep at 6pm on Monday due to hx of hemorrhoids. She used the 2nd size  dilator and it created a large stretch sensation after using the dilator. First size dilator does not recreate this sensation. She has been increasing her fluid consumption - this is making her stool softer and she is not having pain with bowel movements. No urinary related concerns.    From eval: Patient reports that she had breast cancer in 2022 and a local reoccurrence in Sept 2024, which led to her being placed in medical menopause in Oct of 2024. Since then, she has experienced vaginal dryness and tightness in the pelvic floor, contributing to hemorrhoids and painful intercourse. In 2010 and 2014 she had vaginal deliveries, both with tearing. She has seen a GI specialist and plans to have a colonoscopy soon. She has seen a pelvic PT for 3 visits and from that experience she has dilators (6 different sizes - has not used these yet) and cups. She has had a double mastectomy and has lots of scar tissue under the ribs, which she has used the cups for in the past. She feels like her pelvic floor gets crampy every now and again. No bladder related issues to report. She has a vibration plate and rebounder at home. She sees a chiro 1x/wk consistently.  Fluid intake: drinks a lot of water - tries to get 60-90oz   PAIN:  Are you having pain? No NPRS scale: 0/10  PRECAUTIONS: None  RED FLAGS: None   WEIGHT BEARING RESTRICTIONS: No  FALLS:  Has patient fallen in last 6 months? No  OCCUPATION: works currently with college students   ACTIVITY LEVEL : works out 5-6x/wk at [employer], walks 1 mile most days  PLOF: Independent with basic ADLs  PATIENT GOALS: decrease pelvic floor tension at rest and hemorrhoid discomfort, decrease scar tissue restriction present from mastectomy surgery and from vaginal tearing during deliveries   PERTINENT HISTORY:  LEEP vaginally 2015, double mastectomy, 3 endometrial polyps all benign in 09/2022   BOWEL MOVEMENT: Pain with bowel movement: No - will only have  pain  with hemorrhoids when they are present  Type of bowel movement:Type (Bristol Stool Scale) 3-4, Frequency 1-2x/day, Strain no, and Splinting no Fully empty rectum: No  Leakage: No Pads: No Fiber supplement/laxative Yes   URINATION: Pain with urination: No Fully empty bladder: Yes:   Stream: Strong Urgency: Yes  Frequency: within normal limits  Leakage: none Pads: No  INTERCOURSE: not currently sexually active due to pain - feels like barbwire when she tries to have intercourse    Ability to have vaginal penetration No  Pain with intercourse: Pain Interrupts Intercourse DrynessYes  Marinoff Scale: 3/3  PREGNANCY: Vaginal deliveries 2 Tearing Yes: with both deliveries, first tear was lateral in nature   PROLAPSE: None  OBJECTIVE:  Note: Objective measures were completed at Evaluation unless otherwise noted.  PATIENT SURVEYS:  PFIQ-7: 18  COGNITION: Overall cognitive status: Within functional limits for tasks assessed     SENSATION: Light touch: Appears intact  LUMBAR SPECIAL TESTS:  Single leg stance test: Positive  FUNCTIONAL TESTS:  Squat test: mild dynamic knee valgus with loading, no significant lumbopelvic stiffness present   GAIT: Assistive device utilized: None Comments: mild trendelenburg gait pattern with ambulation   POSTURE: rounded shoulders, forward head, and increased thoracic kyphosis  LUMBARAROM/PROM:  A/PROM A/PROM  eval  Flexion Within normal limits   Extension Within normal limits   Right lateral flexion Within normal limits   Left lateral flexion Within normal limits   Right rotation Within normal limits   Left rotation Within normal limits    (Blank rows = not tested)  LOWER EXTREMITY ROM: within functional limits   LOWER EXTREMITY MMT: 4/5 bilateral knees and hips grossly  PALPATION:   General: no significant tenderness to palpation of bilateral adductors or hip flexors   Pelvic Alignment: within normal limits    Abdominal: upper chest breathing and abdominal bracing at rest, decreased lower rib excursion with inhalation                 External Perineal Exam: dryness noted with mild clitoral hood mobility restriction present                              Internal Pelvic Floor: Patient demonstrates normal tone throughout the superficial and deep pelvic floor muscles bilaterally. When patient contracts her pelvic floor, muscle tension remains throughout the pelvic floor without the patient doing so on her own. She demonstrates stiffness throughout superficial and deep pelvic floor, most likely due to increased muscle tension at rest. With deep diaphragmatic breathing in hooklying, patient is able to actively lengthen/relax her pelvic floor with inhalation. No pain following today's examination.    Patient confirms identification and approves PT to assess internal pelvic floor and treatment Yes No emotional/communication barriers or cognitive limitation. Patient is motivated to learn. Patient understands and agrees with treatment goals and plan. PT explains patient will be examined in standing, sitting, and lying down to see how their muscles and joints work. When they are ready, they will be asked to remove their underwear so PT can examine their perineum. The patient is also given the option of providing their own chaperone as one is not provided in our facility. The patient also has the right and is explained the right to defer or refuse any part of the evaluation or treatment including the internal exam. With the patient's consent, PT will use one gloved finger to gently assess the muscles of the pelvic  floor, seeing how well it contracts and relaxes and if there is muscle symmetry. After, the patient will get dressed and PT and patient will discuss exam findings and plan of care. PT and patient discuss plan of care, schedule, attendance policy and HEP activities.  PELVIC MMT:   MMT eval  Vaginal 4/5, 5  quick flicks, 5 second hold  Internal Anal Sphincter   External Anal Sphincter   Puborectalis   Diastasis Recti   (Blank rows = not tested)  TONE: Increased following pelvic floor muscle contraction  PROLAPSE: N/A   TODAY'S TREATMENT:                                                                                                                              DATE:   06/26/23:  Manual therapy  Cupping over lower ribs and abdominal scars  STM over rib restriction from scar tissue: sustained pressure, sweeping techniques  Neuro re-ed: Hooklying diaphragmatic breathing with pelvic floor lengthening during inhalation 2x10  Supine butterfly stretch + diaphragmatic breathing 2x57min  Childs pose + diaphragmatic breathing 2x82min  Self care:  Relative anatomy and the connection between the diaphragm and pelvic floor, silicone based lubricant samples and education, vaginal moisturizer education Dilator size 1 education and use at home: water based lubricant, positioning, technique, and frequency   07/04/23:  Neuro re-ed: Hooklying diaphragmatic breathing with pelvic floor lengthening during inhalation and shortening during exhalation 2x10  Bridge + adductor ball squeeze + diaphragmatic breathing 2x10  Sidelying clamshell + reverse clamshell + diaphragmatic breathing 2x10  Sit to stand + adductor ball squeeze + diaphragmatic breathing 2x10  Therapeutic exercise:  Supine butterfly stretch + diaphragmatic breathing 2x8min  Childs pose + diaphragmatic breathing 2x65min  Single knee to chest stretch + diaphragmatic breathing 2x58min  Self care:  Relative anatomy and the connection between the diaphragm and pelvic floor, silicone based lubricant samples and education, vaginal moisturizer education Dilator size 1-2 education and use at home: water based lubricant, positioning, technique, and frequency   07/11/23:  Neuro re-ed: seated diaphragmatic breathing with pelvic floor lengthening during  inhalation and shortening during exhalation 2x10  Bridge + single leg kick out + diaphragmatic breathing 2x10 alternating  Seated ball squeeze + internal rotation (RTB) + diaphragmatic breathing 2x10 alternating  Reverse lunges holding 5# weights + diaphragmatic breathing 2x10  Standing lat pull down + single leg march + diaphragmatic breathing (RTB) 2x10  Therapeutic exercise:  Supine butterfly stretch + diaphragmatic breathing 2x44min  Childs pose + diaphragmatic breathing 2x60min  Single knee to chest stretch + diaphragmatic breathing 2x51min  Lower trunk rotations + diaphragmatic breathing 2x58min Self care:  Relative anatomy and the connection between the diaphragm and pelvic floor, silicone based lubricant samples and education, vaginal moisturizer education Dilator size 1-2 education and use at home: water based lubricant, positioning, technique, and frequency   PATIENT EDUCATION:  Education details: Relative anatomy and the connection between the diaphragm and pelvic floor,  silicone based lubricant samples and education, vaginal moisturizer education Person educated: Patient Education method: Explanation, Demonstration, Tactile cues, Verbal cues, and Handouts Education comprehension: verbalized understanding, returned demonstration, verbal cues required, tactile cues required, and needs further education  HOME EXERCISE PROGRAM: Access Code: [email or web address] URL: [email or web address] Date: 07/04/2023 Prepared by: Robbin Chill  Exercises - Supine Pelvic Floor Contraction  - 1 x daily - 7 x weekly - 2 sets - 10 reps - Supine Bridge with Mini Swiss Ball Between Knees  - 1 x daily - 7 x weekly - 2 sets - 10 reps - Clamshell  - 1 x daily - 7 x weekly - 2 sets - 8 reps - Sidelying Reverse Clamshell  - 1 x daily - 7 x weekly - 2 sets - 8 reps - Sit to Stand with Ball Between Knees  - 1 x daily - 7 x weekly - 2 sets - 8 reps - Supine Butterfly Groin Stretch  - 1 x daily - 7 x weekly  - 3 sets - hold - Child's Pose Stretch  - 1 x daily - 7 x weekly - 3 sets - hold - Supine Single Knee to Chest Stretch  - 1 x daily - 7 x weekly - 3 sets - hold  ASSESSMENT:  CLINICAL IMPRESSION: Patient is a 48 y.o. female  who was seen today for physical therapy treatment for post-menopausal related care. Since undergoing treatment for breast cancer in 2022 and being medically induced into menopause, patient has been experiencing vaginal dryness, tightness, pain during intercourse, and hemorrhoids. Today we reviewed dilator training with size 1 and 2 dilators and provided patient education on techniques for this at home. Introduction to pelvic floor activation and she had no pain with this. We introduced hip and glute strengthening today and she could feel a slight pulling sensation at the anal region. Downtraining stretches at end of session decreased this slightly. Overall, patient tolerated session well and Pt would benefit from additional PT to further address deficits.    OBJECTIVE IMPAIRMENTS: decreased coordination, decreased endurance, decreased mobility, decreased ROM, decreased strength, and pain.   ACTIVITY LIMITATIONS: vaginal penetration during intercourse/pelvic examinations   PARTICIPATION LIMITATIONS: N/A  PERSONAL FACTORS: Age, Past/current experiences, and Time since onset of injury/illness/exacerbation are also affecting patient's functional outcome.   REHAB POTENTIAL: Good  CLINICAL DECISION MAKING: Stable/uncomplicated  EVALUATION COMPLEXITY: Low   GOALS: Goals reviewed with patient? Yes  SHORT TERM GOALS: Target date: 07/09/2023  Pt will be independent with HEP.  Baseline: Goal status: INITIAL  2.  Pt will be independent with diaphragmatic breathing and down training activities in order to improve pelvic floor relaxation. Baseline:  Goal status: INITIAL  3.  Pt will be independent with use of squatty potty, relaxed toileting mechanics, and  improved bowel movement techniques in order to increase ease of bowel movements and complete evacuation.   Baseline:  Goal status: INITIAL  LONG TERM GOALS: Target date: 12/12/2023  Pt will be independent with advanced HEP.  Baseline:  Goal status: INITIAL  2.  Pt will be ind with using dilators for reducing pelvic floor tension and pain management to allow patient to return to vaginal penetration without significant pain to improve quality of life and relationship with partner.  Baseline:  Goal status: INITIAL  3.  Pt will report less than 2/10 pain with vaginal penetration in order to improve intimate relationship with partner and allow patient to participate in pelvic examinations with little to no  pain. Baseline:  Goal status: INITIAL  4.  Pt will be able to correctly perform diaphragmatic breathing and appropriate pressure management in order to prevent vaginal wall laxity and improve pelvic floor A/ROM at rest so that she is able to utilize full pelvic floor active range of motion.  Baseline:  Goal status: INITIAL  PLAN:  PT FREQUENCY: 1x/week  PT DURATION: 12 weeks  PLANNED INTERVENTIONS: 97110-Therapeutic exercises, 97530- Therapeutic activity, 97112- Neuromuscular re-education, 97535- Self Care, [id number]- Manual therapy, Patient/Family education, Taping, Dry Needling, Joint mobilization, Spinal mobilization, Scar mobilization, Cryotherapy, and Moist heat  PLAN FOR NEXT SESSION: continued internal treatment in hooklying and reassess pelvic floor tension at rest, introduce pelvic floor contractions?, discuss use of dilators at home, introduce progressive downtraining stretches, manual to ribs and perineal body   Marni Sins, PT 07/11/2023, 4:26 PM

## 2023-07-15 ENCOUNTER — Telehealth: Payer: Self-pay | Admitting: Hematology and Oncology

## 2023-07-15 NOTE — Telephone Encounter (Signed)
Rescheduled appointment with the patient.

## 2023-07-16 ENCOUNTER — Ambulatory Visit: Admitting: Internal Medicine

## 2023-07-16 ENCOUNTER — Encounter: Payer: Self-pay | Admitting: Internal Medicine

## 2023-07-16 VITALS — BP 130/63 | HR 84 | Temp 99.0°F | Resp 12 | Ht 67.0 in | Wt 133.0 lb

## 2023-07-16 DIAGNOSIS — Z860101 Personal history of adenomatous and serrated colon polyps: Secondary | ICD-10-CM

## 2023-07-16 DIAGNOSIS — K648 Other hemorrhoids: Secondary | ICD-10-CM

## 2023-07-16 DIAGNOSIS — K5730 Diverticulosis of large intestine without perforation or abscess without bleeding: Secondary | ICD-10-CM | POA: Diagnosis not present

## 2023-07-16 DIAGNOSIS — Z800 Family history of malignant neoplasm of digestive organs: Secondary | ICD-10-CM | POA: Diagnosis not present

## 2023-07-16 DIAGNOSIS — K625 Hemorrhage of anus and rectum: Secondary | ICD-10-CM | POA: Diagnosis not present

## 2023-07-16 DIAGNOSIS — Z860100 Personal history of colon polyps, unspecified: Secondary | ICD-10-CM

## 2023-07-16 DIAGNOSIS — Z8719 Personal history of other diseases of the digestive system: Secondary | ICD-10-CM

## 2023-07-16 DIAGNOSIS — K921 Melena: Secondary | ICD-10-CM

## 2023-07-16 DIAGNOSIS — Z1211 Encounter for screening for malignant neoplasm of colon: Secondary | ICD-10-CM

## 2023-07-16 DIAGNOSIS — K649 Unspecified hemorrhoids: Secondary | ICD-10-CM

## 2023-07-16 MED ORDER — SODIUM CHLORIDE 0.9 % IV SOLN: 500.0000 mL | Freq: Once | INTRAVENOUS | Status: DC

## 2023-07-16 NOTE — Progress Notes (Signed)
 Expand All Collapse All       Ellouise Console, PA-C [address] [place], [place]  [address] Phone: [phone]   Gastroenterology Consultation   Referring Provider:     Claudene Pellet, MD Primary Care Physician:  Claudene Pellet, MD Primary Gastroenterologist:  Ellouise Console, PA-C / Norleen Kiang, MD  Reason for Consultation:     Hemorrhoid, Blood in Stool, Irregular BM        HPI:   Kaitlin [name] is a 48 y.o. y/o female referred for consultation & management  by Claudene Pellet, MD.     Patient states she has had intermittent rectal symptoms since giving birth to her first son in 2013 with difficult delivery and large tear.  She had a large anal fissure.  She has had intermittent hemorrhoids and tight anal sphincter tone.  In 2013 she underwent physical pelvic floor physical therapy at [facility] and her fissure improved.  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     In the past year she had recurrent breast cancer and was started on Verzenio  chemotherapy 10/2022.  She could not tolerate the higher dose 100 mg daily which caused severe diarrhea.  Dose was lowered to 50 Mg daily which was better tolerated.  She continues to have occasional episode of diarrhea alternating with constipation.  Had 2 episodes of rectal bleeding with bright red blood on the tissue after bowel movements in the past few months.  She is concerned about possible hemorrhoids.  She was having rectal pain, which has currently improved.  She is currently seeing physical therapist in [place] to help with pelvic floor muscle dysfunction.  She has used rectal dilators to help with tight anal sphincter.  Recently used hydrocortisone/pramoxine cream prescribed from her PCP with some benefit.  Has also used diltiazem lidocaine  cream to the rectum which helped anal fissure.  Currently having 1 or 2 formed stools per day which are not hard and not diarrhea.  Occasionally strains for BM.   07/2016  Colonoscopy by Dr. Teressa: Normal.  No polyps. Excellent Prep.     2013 Colonoscopy: dimunitive Sessile Serrated Adenoma removed.   Family history significant for paternal grandfather who had colon cancer age 4, paternal aunt who had colon cancer, father who had colon polyps, paternal uncle who had colon polyps.   10/2020 Negative genetic testing on the CancerNext-Expanded+RNAinsight.  The report date is 11/16/2020:  The CancerNext-Expanded gene panel offered by [facility] and includes sequencing and rearrangement analysis for the following 77 genes: AIP, ALK, APC*, ATM*, AXIN2, BAP1, BARD1, BLM, BMPR1A, BRCA1*, BRCA2*, BRIP1*, CDC73, CDH1*, CDK4, CDKN1B, CDKN2A, CHEK2*, CTNNA1, DICER1, FANCC, FH, FLCN, GALNT12, KIF1B, LZTR1, MAX, MEN1, MET, MLH1*, MSH2*, MSH3, MSH6*, MUTYH*, NBN, NF1*, NF2, NTHL1, PALB2*, PHOX2B, PMS2*, POT1, PRKAR1A, PTCH1, PTEN*, RAD51C*, RAD51D*, RB1, RECQL, RET, SDHA, SDHAF2, SDHB, SDHC, SDHD, SMAD4, SMARCA4, SMARCB1, SMARCE1, STK11, SUFU, TMEM127, TP53*, TSC1, TSC2, VHL and XRCC2 (sequencing and deletion/duplication); EGFR, EGLN1, HOXB13, KIT, MITF, PDGFRA, POLD1, and POLE (sequencing only); EPCAM and GREM1 (deletion/duplication only). DNA and RNA analyses performed for * genes.        Past Medical History:  Diagnosis Date   Allergy     Anal fissure     Anemia     Asthma     Breast cancer (HCC)      twice   Cardiomyopathy [facility])     Chronic headaches     Delivery normal 11/2008   Family  history of colon cancer     Family history of leukemia     Family history of melanoma     Family history of ovarian cancer     Heart murmur     Kidney stone      x of   PONV (postoperative nausea and vomiting)     S/P tonsillectomy      2000               Past Surgical History:  Procedure Laterality Date   BREAST BIOPSY Right 09/12/2022    US  RT BREAST BX W LOC DEV 1ST LESION IMG BX SPEC US  GUIDE 09/12/2022 [facility]   BREAST BIOPSY Left 01/18/2023    US  LT  BREAST BX W LOC DEV 1ST LESION IMG BX SPEC US  GUIDE 01/18/2023 [facility]   BREAST CYST EXCISION Right 09/26/2022    Procedure: RIGHT BREAST CANCER RECURRENT EXCISION;  Surgeon: Ebbie Cough, MD;  Location: [facility];  Service: General;  Laterality: Right;  LMA   BREAST RECONSTRUCTION WITH PLACEMENT OF TISSUE EXPANDER AND ALLODERM Bilateral 12/06/2020    Procedure: BREAST RECONSTRUCTION WITH PLACEMENT OF TISSUE EXPANDER AND ALLODERM;  Surgeon: Arelia Filippo, MD;  Location: [facility];  Service: Plastics;  Laterality: Bilateral;   LEEP       MASTECTOMY W/ SENTINEL NODE BIOPSY Right 12/06/2020    Procedure: RIGHT MASTECTOMY WITH RIGHT AXILLARY SENTINEL LYMPH NODE BIOPSY;  Surgeon: Ebbie Cough, MD;  Location: [facility];  Service: General;  Laterality: Right;   REMOVAL OF BILATERAL TISSUE EXPANDERS WITH PLACEMENT OF BILATERAL BREAST IMPLANTS Bilateral 09/15/2021    Procedure: REMOVAL OF BILATERAL TISSUE EXPANDERS WITH PLACEMENT OF BILATERAL BREAST IMPLANTS;  Surgeon: Arelia Filippo, MD;  Location: [facility];  Service: Plastics;  Laterality: Bilateral;   tonsil removal   01/22/1998   TOTAL MASTECTOMY Left 12/06/2020    Procedure: LEFT TOTAL MASTECTOMY;  Surgeon: Ebbie Cough, MD;  Location:  SURGERY CENTER;  Service: General;  Laterality: Left;                 Prior to Admission medications   Medication Sig Start Date End Date Taking? Authorizing Provider  abemaciclib  (VERZENIO ) 50 MG tablet Take 1 tablet (50 mg total) by mouth 2 (two) times daily. Swallow tablets whole. Do not chew, crush, or split tablets before swallowing. 04/24/23     Gudena, Vinay, MD  anastrozole  (ARIMIDEX ) 1 MG tablet Take 1 tablet (1 mg total) by mouth daily. 10/04/22     Gudena, Vinay, MD  goserelin (ZOLADEX ) 3.6 MG injection Inject 3.6 mg into the skin.       [provider]  ivermectin (STROMECTOL) 3 MG TABS tablet Take 12 mg by  mouth once. Twice a week (Monday, Thursday)       [provider]  lidocaine -prilocaine  (EMLA ) cream Apply 1 Application topically as needed. 01/03/23     Gudena, Vinay, MD  Magnesium Glycinate 120 MG CAPS Take 240 mg by mouth at bedtime.       [provider]  MISC NATURAL PRODUCTS PO Take by mouth daily. Super Digestive Enzymes by [employer]       [provider]  Multiple Vitamins-Minerals (PREVENT) CAPS Take by mouth.       [provider]  Omega-3 Fatty Acids (FISH OIL) 1000 MG CAPS Take 1,000 mg by mouth.       [provider]  Probiotic, Lactobacillus, CAPS Take 1 tablet by  mouth daily.       [provider]  Vitamin D-Vitamin K (VITAMIN K2-VITAMIN D3 PO) Take by mouth daily. Vitamin D: 1.25 mcg / Vitamin K: 90 mcg       [provider]           Family History  Problem Relation Age of Onset   Memory loss Mother     Ulcerative colitis Mother     Colon polyps Father     Bladder Cancer Father     Irritable bowel syndrome Sister     Leukemia Maternal Aunt 50        AML   Melanoma Maternal Aunt 16   Breast cancer Paternal Aunt 32   Colon cancer Paternal Aunt     Leukemia Paternal Aunt 29   Prostate cancer Paternal Uncle 63   Colon polyps Paternal Uncle     Cervical cancer Maternal Grandmother     Parkinson's disease Maternal Grandmother     Alzheimer's disease Maternal Grandmother     Multiple myeloma Maternal Grandfather     Skin cancer Maternal Grandfather     Skin cancer Paternal Grandmother     Colon cancer Paternal Grandfather 91   Stroke Paternal Grandfather     Ovarian cancer Other 4        MGMs sister   Esophageal cancer Neg Hx     Rectal cancer Neg Hx     Stomach cancer Neg Hx            Social History  Social History         Tobacco Use   Smoking status: Former      Types: Cigarettes   Smokeless tobacco: Never  Vaping Use   Vaping status: Never Used  Substance Use Topics   Alcohol  use: Not Currently   Drug use: No             Allergies as of 05/30/2023 - Review Complete 05/30/2023  Allergen Reaction Noted   Ceclor [cefaclor] Hives and Shortness Of Breath 05/19/2010   Cephalosporins Anaphylaxis and Hives 06/07/2022   Rizatriptan Anaphylaxis 12/25/2013   Amoxicillin Hives and Rash 11/02/2020   Penicillins Other (See Comments) 09/21/2021      Review of Systems:    All systems reviewed and negative except where noted in HPI.    Physical Exam:  BP 110/68   Pulse 72   Ht 5' 7 (1.702 m)   Wt 133 lb (60.3 kg)   BMI 20.83 kg/m  No LMP recorded.   General:   Alert,  Well-developed, well-nourished, pleasant and cooperative in NAD Lungs:  Respirations even and unlabored.  Clear throughout to auscultation.   No wheezes, crackles, or rhonchi. No acute distress. Heart:  Regular rate and rhythm; no murmurs, clicks, rubs, or gallops. Abdomen:  Normal bowel sounds.  No bruits.  Soft, and non-distended without masses, hepatosplenomegaly or hernias noted.  No Tenderness.  No guarding or rebound tenderness.   Rectal: Small external hemorrhoid skin tags present.  No evidence of anal fissure or tenderness.  No swollen external hemorrhoids.  Tight anal sphincter tone with question of mild anal stricture.  No internal masses or tenderness.  Hemoccult negative. Neurologic:  Alert and oriented x3;  grossly normal neurologically. Psych:  Alert and cooperative. Normal mood and affect.   Chaperone for Exam:  Max Redman, CMA    Imaging Studies: Imaging Results  No results found.     Labs: CBC Labs (Brief)  Component Value Date/Time    WBC 4.6 05/22/2023 1120    WBC 4.3 09/26/2022 0835    RBC 3.46 (L) 05/22/2023 1120    HGB 11.4 (L) 05/22/2023 1120    HCT 33.0 (L) 05/22/2023 1120    PLT 218 05/22/2023 1120    MCV 95.4 05/22/2023 1120    MCH 32.9 05/22/2023 1120    MCHC 34.5 05/22/2023 1120    RDW 12.3 05/22/2023 1120    LYMPHSABS 0.9 05/22/2023 1120     MONOABS 0.4 05/22/2023 1120    EOSABS 0.2 05/22/2023 1120    BASOSABS 0.1 05/22/2023 1120        CMP     Labs (Brief)          Component Value Date/Time    NA 139 05/22/2023 1120    K 4.2 05/22/2023 1120    CL 106 05/22/2023 1120    CO2 28 05/22/2023 1120    GLUCOSE 88 05/22/2023 1120    BUN 18 05/22/2023 1120    CREATININE 0.93 05/22/2023 1120    CALCIUM 9.2 05/22/2023 1120    PROT 6.8 05/22/2023 1120    ALBUMIN 4.4 05/22/2023 1120    AST 28 05/22/2023 1120    ALT 28 05/22/2023 1120    ALKPHOS 63 05/22/2023 1120    BILITOT 0.5 05/22/2023 1120    GFRNONAA >60 05/22/2023 1120        Assessment and Plan:    Kaitlin [name] is a 48 y.o. y/o female has been referred for    Hemorrhoids:  For External Hemorrhoids: Warm water sitz bath with epsom salt for flare up of external hemorrhoids. Use OTC Preparation H, Tucks Pads, and Witch Hazel wipes as needed. Continue hydrocortisone/pramoxine cream as needed.   For Internal Hemorrhoids: Rx Hydrocortisone Suppositories 25mg  Insert 1 into rectum once daily at bedtime for 10-14 days.  Call back for Rx if needed. Stressed importance of treating underlying constipation. Avoid Sitting on the toilet for prolonged amount of time. Discussed Internal Hemorrhoid Banding if no improvement with conservative treament.    Hx Anal Fissure and Tight Anal Sphincter- No fissure on Exam today. -Rx Diltiazem / Lidocaine  Cream if she has recurrent fissure in the future. - Continue pelvic floor physical therapy in [place].   Blood in Stool -mild, 2 episodes.  Hemoccult negative exam today. -Scheduling Colonoscopy I discussed risks of colonoscopy with patient to include risk of bleeding, colon perforation, and risk of sedation.  Patient expressed understanding and agrees to proceed with colonoscopy.    Hx adenomatous colon polyp and Family Hx Colon Cancer / Colon Polyps -Scheduling Colonoscopy I discussed risks of colonoscopy with patient  to include risk of bleeding, colon perforation, and risk of sedation.  Patient expressed understanding and agrees to proceed with colonoscopy.    Irregular Bowel Habits. Start OTC Benefiber Powder. Mix 1 - 2 Tablespoons in 6 - 8 ounces of a Drink Once Daily. Drink 64 ounces of water / fluids Daily.      Follow up As Needed.   Ellouise Console, PA-C    Recent H&P as above.  Now for colonoscopy.  No interval change.

## 2023-07-16 NOTE — Progress Notes (Signed)
 Sedate, gd SR, tolerated procedure well, VSS, report to RN

## 2023-07-16 NOTE — Op Note (Signed)
 [facility] Patient Name: Kaitlin [name] Procedure Date: 07/16/2023 1:09 PM MRN: [id number] Endoscopist: Norleen SAILOR. Abran , MD, [id number] Age: 48 Referring MD:  Date of Birth: 1975-12-26 Gender: Female Account #: [id number] Procedure:                Colonoscopy with biopsies Indications:              High risk colon cancer surveillance: Personal                            history of non-advanced adenoma, loose bowels (felt                            secondary to medication); minor rectal bleeding Medicines:                Monitored Anesthesia Care Procedure:                Pre-Anesthesia Assessment:                           - Prior to the procedure, a History and Physical                            was performed, and patient medications and                            allergies were reviewed. The patient's tolerance of                            previous anesthesia was also reviewed. The risks                            and benefits of the procedure and the sedation                            options and risks were discussed with the patient.                            All questions were answered, and informed consent                            was obtained. Prior Anticoagulants: The patient has                            taken no anticoagulant or antiplatelet agents. ASA                            Grade Assessment: II - A patient with mild systemic                            disease. After reviewing the risks and benefits,                            the patient was deemed in satisfactory condition to  undergo the procedure.                           After obtaining informed consent, the colonoscope                            was passed under direct vision. Throughout the                            procedure, the patient's blood pressure, pulse, and                            oxygen saturations were monitored continuously. The                             Olympus CF-HQ190L ([id number]) Colonoscope was                            introduced through the anus and advanced to the the                            cecum, identified by appendiceal orifice and                            ileocecal valve. The ileocecal valve, appendiceal                            orifice, and rectum were photographed. The quality                            of the bowel preparation was excellent. The                            colonoscopy was performed without difficulty. The                            patient tolerated the procedure well. The bowel                            preparation used was SUPREP via split dose                            instruction. Scope In: 2:08:57 PM Scope Out: 2:19:17 PM Scope Withdrawal Time: 0 hours 7 minutes 19 seconds  Total Procedure Duration: 0 hours 10 minutes 20 seconds  Findings:                 Multiple small-mouthed diverticula were found in                            the sigmoid colon. Small internal hemorrhoids                           The exam was otherwise without abnormality on  direct and retroflexion views.                           Biopsies for histology were taken with a cold                            forceps from the entire colon for evaluation of                            microscopic colitis. Complications:            No immediate complications. Estimated blood loss:                            None. Estimated Blood Loss:     Estimated blood loss: none. Impression:               - Diverticulosis in the sigmoid colon. Small                            internal hemorrhoids                           - The examination was otherwise normal on direct                            and retroflexion views.                           - Biopsies were taken with a cold forceps from the                            entire colon for evaluation of microscopic colitis. Recommendation:           - Repeat  colonoscopy in 10 years for surveillance.                           - Patient has a contact number available for                            emergencies. The signs and symptoms of potential                            delayed complications were discussed with the                            patient. Return to normal activities tomorrow.                            Written discharge instructions were provided to the                            patient.                           - Resume previous diet.                           -  Continue present medications.                           - Await pathology results.                           - Recommend Citrucel powder. 1 to 2 tablespoons                            daily in 14 ounces of water or juice. This may                            improve bowel consistency overall and assist with                            bowel movements. Norleen SAILOR. Abran, MD 07/16/2023 2:44:09 PM This report has been signed electronically.

## 2023-07-16 NOTE — Patient Instructions (Addendum)
 Resume previous diet Continue present medications Await pathology results Citrucel powder 1-2 tablespoons daily in 14 ounces of water or juice  YOU HAD AN ENDOSCOPIC PROCEDURE TODAY AT THE [facility]:   Refer to the procedure report that was given to you for any specific questions about what was found during the examination.  If the procedure report does not answer your questions, please call your gastroenterologist to clarify.  If you requested that your care partner not be given the details of your procedure findings, then the procedure report has been included in a sealed envelope for you to review at your convenience later.  YOU SHOULD EXPECT: Some feelings of bloating in the abdomen. Passage of more gas than usual.  Walking can help get rid of the air that was put into your GI tract during the procedure and reduce the bloating. If you had a lower endoscopy (such as a colonoscopy or flexible sigmoidoscopy) you may notice spotting of blood in your stool or on the toilet paper. If you underwent a bowel prep for your procedure, you may not have a normal bowel movement for a few days.  Please Note:  You might notice some irritation and congestion in your nose or some drainage.  This is from the oxygen used during your procedure.  There is no need for concern and it should clear up in a day or so.  SYMPTOMS TO REPORT IMMEDIATELY:  Following lower endoscopy (colonoscopy or flexible sigmoidoscopy):  Excessive amounts of blood in the stool  Significant tenderness or worsening of abdominal pains  Swelling of the abdomen that is new, acute  Fever of 100F or higher  For urgent or emergent issues, a gastroenterologist can be reached at any hour by calling [phone]. Do not use MyChart messaging for urgent concerns.   DIET:  We do recommend a small meal at first, but then you may proceed to your regular diet.  Drink plenty of fluids but you should avoid alcoholic beverages for 24  hours.  ACTIVITY:  You should plan to take it easy for the rest of today and you should NOT DRIVE or use heavy machinery until tomorrow (because of the sedation medicines used during the test).    FOLLOW UP: Our staff will call the number listed on your records the next business day following your procedure.  We will call around 7:15- 8:00 am to check on you and address any questions or concerns that you may have regarding the information given to you following your procedure. If we do not reach you, we will leave a message.     If any biopsies were taken you will be contacted by phone or by letter within the next 1-3 weeks.  Please call us  at [phone] if you have not heard about the biopsies in 3 weeks.   SIGNATURES/CONFIDENTIALITY: You and/or your care partner have signed paperwork which will be entered into your electronic medical record.  These signatures attest to the fact that that the information above on your After Visit Summary has been reviewed and is understood.  Full responsibility of the confidentiality of this discharge information lies with you and/or your care-partner.

## 2023-07-17 ENCOUNTER — Inpatient Hospital Stay

## 2023-07-17 ENCOUNTER — Inpatient Hospital Stay: Attending: Hematology and Oncology

## 2023-07-17 ENCOUNTER — Telehealth: Payer: Self-pay

## 2023-07-17 VITALS — BP 103/62 | HR 63 | Temp 98.5°F | Resp 16

## 2023-07-17 DIAGNOSIS — Z79899 Other long term (current) drug therapy: Secondary | ICD-10-CM | POA: Diagnosis not present

## 2023-07-17 DIAGNOSIS — Z5111 Encounter for antineoplastic chemotherapy: Secondary | ICD-10-CM | POA: Insufficient documentation

## 2023-07-17 DIAGNOSIS — Z1732 Human epidermal growth factor receptor 2 negative status: Secondary | ICD-10-CM | POA: Insufficient documentation

## 2023-07-17 DIAGNOSIS — C50411 Malignant neoplasm of upper-outer quadrant of right female breast: Secondary | ICD-10-CM | POA: Diagnosis present

## 2023-07-17 DIAGNOSIS — Z170 Estrogen receptor positive status [ER+]: Secondary | ICD-10-CM | POA: Diagnosis not present

## 2023-07-17 MED ORDER — GOSERELIN ACETATE 3.6 MG ~~LOC~~ IMPL
3.6000 mg | DRUG_IMPLANT | Freq: Once | SUBCUTANEOUS | Status: AC
  Administered 2023-07-17: 3.6 mg via SUBCUTANEOUS
  Filled 2023-07-17: qty 3.6

## 2023-07-17 NOTE — Telephone Encounter (Signed)
 Follow up call to pt, lm for pt to call if having any difficulty with normal activities or eating and drinking.  Also to call if any other questions or concerns.

## 2023-07-19 ENCOUNTER — Ambulatory Visit: Payer: Self-pay | Admitting: Internal Medicine

## 2023-07-19 LAB — SURGICAL PATHOLOGY

## 2023-07-24 ENCOUNTER — Encounter: Payer: Self-pay | Admitting: Hematology and Oncology

## 2023-08-01 ENCOUNTER — Ambulatory Visit: Attending: Hematology and Oncology | Admitting: Physical Therapy

## 2023-08-01 DIAGNOSIS — R279 Unspecified lack of coordination: Secondary | ICD-10-CM | POA: Diagnosis present

## 2023-08-01 DIAGNOSIS — M6281 Muscle weakness (generalized): Secondary | ICD-10-CM | POA: Diagnosis present

## 2023-08-01 DIAGNOSIS — R293 Abnormal posture: Secondary | ICD-10-CM | POA: Insufficient documentation

## 2023-08-01 NOTE — Therapy (Signed)
 OUTPATIENT PHYSICAL THERAPY FEMALE PELVIC TREATMENT   Patient Name: Kaitlin [name] MRN: [id number] DOB:December 18, 1975, 48 y.o., female Today's Date: 08/01/2023  END OF SESSION:  PT End of Session - 08/01/23 1621     Visit Number 5    Number of Visits 12    Date for PT Re-Evaluation 10/12/23    Authorization Type [employer]    PT Start Time 0330    PT Stop Time 0415    PT Time Calculation (min) 45 min    Activity Tolerance Patient tolerated treatment well    Behavior During Therapy WFL for tasks assessed/performed              Past Medical History:  Diagnosis Date   Allergy    Anal fissure    Anemia    Asthma    Breast cancer (HCC)    twice   Cardiomyopathy (HCC)    Chronic headaches    Delivery normal 11/2008   Family history of colon cancer    Family history of leukemia    Family history of melanoma    Family history of ovarian cancer    Heart murmur    Kidney stone    x of   PONV (postoperative nausea and vomiting)    S/P tonsillectomy    2000   Past Surgical History:  Procedure Laterality Date   BREAST BIOPSY Right 09/12/2022   US  RT BREAST BX W LOC DEV 1ST LESION IMG BX SPEC US  GUIDE 09/12/2022 [facility]   BREAST BIOPSY Left 01/18/2023   US  LT BREAST BX W LOC DEV 1ST LESION IMG BX SPEC US  GUIDE 01/18/2023 [facility]   BREAST CYST EXCISION Right 09/26/2022   Procedure: RIGHT BREAST CANCER RECURRENT EXCISION;  Surgeon: Ebbie Cough, MD;  Location: [facility];  Service: General;  Laterality: Right;  LMA   BREAST RECONSTRUCTION WITH PLACEMENT OF TISSUE EXPANDER AND ALLODERM Bilateral 12/06/2020   Procedure: BREAST RECONSTRUCTION WITH PLACEMENT OF TISSUE EXPANDER AND ALLODERM;  Surgeon: Arelia Filippo, MD;  Location: [facility];  Service: Plastics;  Laterality: Bilateral;   LEEP     MASTECTOMY W/ SENTINEL NODE BIOPSY Right 12/06/2020   Procedure: RIGHT MASTECTOMY WITH RIGHT AXILLARY SENTINEL LYMPH NODE BIOPSY;   Surgeon: Ebbie Cough, MD;  Location: [facility];  Service: General;  Laterality: Right;   REMOVAL OF BILATERAL TISSUE EXPANDERS WITH PLACEMENT OF BILATERAL BREAST IMPLANTS Bilateral 09/15/2021   Procedure: REMOVAL OF BILATERAL TISSUE EXPANDERS WITH PLACEMENT OF BILATERAL BREAST IMPLANTS;  Surgeon: Arelia Filippo, MD;  Location: [facility];  Service: Plastics;  Laterality: Bilateral;   tonsil removal  01/22/1998   TOTAL MASTECTOMY Left 12/06/2020   Procedure: LEFT TOTAL MASTECTOMY;  Surgeon: Ebbie Cough, MD;  Location: [facility];  Service: General;  Laterality: Left;   Patient Active Problem List   Diagnosis Date Noted   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   Malignant neoplasm of upper-outer quadrant of right breast in female, estrogen receptor positive (HCC) 12/12/2020   S/P bilateral mastectomy 12/06/2020   Genetic testing 11/17/2020   Family history of breast cancer 11/02/2020   Family history of ovarian cancer 11/02/2020   Family history of colon cancer 11/02/2020   Family history of melanoma 11/02/2020   Family history of leukemia 11/02/2020   Bilateral breast cancer (HCC) 11/01/2020   LLQ abdominal pain 04/06/2011    PCP: Claudene Pellet, MD  REFERRING PROVIDER: Odean Potts, MD   REFERRING  DIAG: Z78.0 (ICD-10-CM) - Post-menopausal  THERAPY DIAG:  Unspecified lack of coordination  Muscle weakness (generalized)  Rationale for Evaluation and Treatment: Rehabilitation  ONSET DATE: 2010  SUBJECTIVE:                                                                                                                                                                                           SUBJECTIVE STATEMENT: Patient reports that she is doing well today. She had a colonoscopy last week and it went very well. She has two small internal hemorrhoids and a tighter sphincter muscle. She has been taking  fiber mixtures that are very gentle and it has been life changing - no tugging sensations in the stomach since starting fiber and she has been having normal stools. She is going to continue working on the 2nd dilator size in her set, it is still not comfortable after she uses it. Water intake has been consistent.  She has been using the vulva balm at home and it is helping her so much.   From eval: Patient reports that she had breast cancer in 2022 and a local reoccurrence in Sept 2024, which led to her being placed in medical menopause in Oct of 2024. Since then, she has experienced vaginal dryness and tightness in the pelvic floor, contributing to hemorrhoids and painful intercourse. In 2010 and 2014 she had vaginal deliveries, both with tearing. She has seen a GI specialist and plans to have a colonoscopy soon. She has seen a pelvic PT for 3 visits and from that experience she has dilators (6 different sizes - has not used these yet) and cups. She has had a double mastectomy and has lots of scar tissue under the ribs, which she has used the cups for in the past. She feels like her pelvic floor gets crampy every now and again. No bladder related issues to report. She has a vibration plate and rebounder at home. She sees a chiro 1x/wk consistently.  Fluid intake: drinks a lot of water - tries to get 60-90oz   PAIN:  Are you having pain? No NPRS scale: 0/10  PRECAUTIONS: None  RED FLAGS: None   WEIGHT BEARING RESTRICTIONS: No  FALLS:  Has patient fallen in last 6 months? No  OCCUPATION: works currently with college students   ACTIVITY LEVEL : works out 5-6x/wk at [employer], walks 1 mile most days  PLOF: Independent with basic ADLs  PATIENT GOALS: decrease pelvic floor tension at rest and hemorrhoid discomfort, decrease scar tissue restriction present from mastectomy surgery and from vaginal tearing during deliveries   PERTINENT HISTORY:  LEEP vaginally 2015, double  mastectomy, 3  endometrial polyps all benign in 09/2022   BOWEL MOVEMENT: Pain with bowel movement: No - will only have pain with hemorrhoids when they are present  Type of bowel movement:Type (Bristol Stool Scale) 3-4, Frequency 1-2x/day, Strain no, and Splinting no Fully empty rectum: No  Leakage: No Pads: No Fiber supplement/laxative Yes   URINATION: Pain with urination: No Fully empty bladder: Yes:   Stream: Strong Urgency: Yes  Frequency: within normal limits  Leakage: none Pads: No  INTERCOURSE: not currently sexually active due to pain - feels like barbwire when she tries to have intercourse    Ability to have vaginal penetration No  Pain with intercourse: Pain Interrupts Intercourse DrynessYes  Marinoff Scale: 3/3  PREGNANCY: Vaginal deliveries 2 Tearing Yes: with both deliveries, first tear was lateral in nature   PROLAPSE: None  OBJECTIVE:  Note: Objective measures were completed at Evaluation unless otherwise noted.  PATIENT SURVEYS:  PFIQ-7: 23  COGNITION: Overall cognitive status: Within functional limits for tasks assessed     SENSATION: Light touch: Appears intact  LUMBAR SPECIAL TESTS:  Single leg stance test: Positive  FUNCTIONAL TESTS:  Squat test: mild dynamic knee valgus with loading, no significant lumbopelvic stiffness present   GAIT: Assistive device utilized: None Comments: mild trendelenburg gait pattern with ambulation   POSTURE: rounded shoulders, forward head, and increased thoracic kyphosis  LUMBARAROM/PROM:  A/PROM A/PROM  eval  Flexion Within normal limits   Extension Within normal limits   Right lateral flexion Within normal limits   Left lateral flexion Within normal limits   Right rotation Within normal limits   Left rotation Within normal limits    (Blank rows = not tested)  LOWER EXTREMITY ROM: within functional limits   LOWER EXTREMITY MMT: 4/5 bilateral knees and hips grossly  PALPATION:   General: no significant  tenderness to palpation of bilateral adductors or hip flexors   Pelvic Alignment: within normal limits   Abdominal: upper chest breathing and abdominal bracing at rest, decreased lower rib excursion with inhalation                 External Perineal Exam: dryness noted with mild clitoral hood mobility restriction present                              Internal Pelvic Floor: Patient demonstrates normal tone throughout the superficial and deep pelvic floor muscles bilaterally. When patient contracts her pelvic floor, muscle tension remains throughout the pelvic floor without the patient doing so on her own. She demonstrates stiffness throughout superficial and deep pelvic floor, most likely due to increased muscle tension at rest. With deep diaphragmatic breathing in hooklying, patient is able to actively lengthen/relax her pelvic floor with inhalation. No pain following today's examination.    Patient confirms identification and approves PT to assess internal pelvic floor and treatment Yes No emotional/communication barriers or cognitive limitation. Patient is motivated to learn. Patient understands and agrees with treatment goals and plan. PT explains patient will be examined in standing, sitting, and lying down to see how their muscles and joints work. When they are ready, they will be asked to remove their underwear so PT can examine their perineum. The patient is also given the option of providing their own chaperone as one is not provided in our facility. The patient also has the right and is explained the right to defer or refuse any part of the evaluation or  treatment including the internal exam. With the patient's consent, PT will use one gloved finger to gently assess the muscles of the pelvic floor, seeing how well it contracts and relaxes and if there is muscle symmetry. After, the patient will get dressed and PT and patient will discuss exam findings and plan of care. PT and patient discuss plan  of care, schedule, attendance policy and HEP activities.  PELVIC MMT:   MMT eval  Vaginal 4/5, 5 quick flicks, 5 second hold  Internal Anal Sphincter   External Anal Sphincter   Puborectalis   Diastasis Recti   (Blank rows = not tested)  TONE: Increased following pelvic floor muscle contraction  PROLAPSE: N/A   TODAY'S TREATMENT:                                                                                                                              DATE:   07/04/23:  Neuro re-ed: Hooklying diaphragmatic breathing with pelvic floor lengthening during inhalation and shortening during exhalation 2x10  Bridge + adductor ball squeeze + diaphragmatic breathing 2x10  Sidelying clamshell + reverse clamshell + diaphragmatic breathing 2x10  Sit to stand + adductor ball squeeze + diaphragmatic breathing 2x10  Therapeutic exercise:  Supine butterfly stretch + diaphragmatic breathing 2x10min  Childs pose + diaphragmatic breathing 2x26min  Single knee to chest stretch + diaphragmatic breathing 2x32min  Self care:  Relative anatomy and the connection between the diaphragm and pelvic floor, silicone based lubricant samples and education, vaginal moisturizer education Dilator size 1-2 education and use at home: water based lubricant, positioning, technique, and frequency   07/11/23:  Neuro re-ed: seated diaphragmatic breathing with pelvic floor lengthening during inhalation and shortening during exhalation 2x10  Bridge + single leg kick out + diaphragmatic breathing 2x10 alternating  Seated ball squeeze + internal rotation (RTB) + diaphragmatic breathing 2x10 alternating  Reverse lunges holding 5# weights + diaphragmatic breathing 2x10  Standing lat pull down + single leg march + diaphragmatic breathing (RTB) 2x10  Therapeutic exercise:  Supine butterfly stretch + diaphragmatic breathing 2x110min  Childs pose + diaphragmatic breathing 2x29min  Single knee to chest stretch + diaphragmatic  breathing 2x61min  Lower trunk rotations + diaphragmatic breathing 2x26min Self care:  Relative anatomy and the connection between the diaphragm and pelvic floor, silicone based lubricant samples and education, vaginal moisturizer education Dilator size 1-2 education and use at home: water based lubricant, positioning, technique, and frequency   08/01/23: Neuro re-ed: standing diaphragmatic breathing with pelvic floor lengthening during inhalation and shortening during exhalation 2x10  Bridge + hip abduction + diaphragmatic breathing 2x10 alternating  Dead bug legs only+ diaphragmatic breathing 2x10 (alternating)  Bird dog + diaphragmatic breathing 2x10  Bird dog on physioball + diaphragmatic breathing 2x10  Therapeutic exercise:  Supine butterfly stretch + diaphragmatic breathing 2x59min  Childs pose + diaphragmatic breathing 2x44min  Single knee to chest stretch + diaphragmatic breathing 2x86min  Lower trunk rotations + diaphragmatic  breathing 2x18min Self care:  Relative anatomy and the connection between the diaphragm and pelvic floor, silicone based lubricant samples and education, vaginal moisturizer education Dilator size 1-2 education and use at home: water based lubricant, positioning, technique, and frequency   PATIENT EDUCATION:  Education details: Relative anatomy and the connection between the diaphragm and pelvic floor, silicone based lubricant samples and education, vaginal moisturizer education Person educated: Patient Education method: Explanation, Demonstration, Tactile cues, Verbal cues, and Handouts Education comprehension: verbalized understanding, returned demonstration, verbal cues required, tactile cues required, and needs further education  HOME EXERCISE PROGRAM: Access Code: [email or web address] URL: [email or web address] Date: 07/04/2023 Prepared by: Celena Domino  Exercises - Supine Pelvic Floor Contraction  - 1 x daily - 7 x weekly - 2 sets - 10 reps - Supine  Bridge with Mini Swiss Ball Between Knees  - 1 x daily - 7 x weekly - 2 sets - 10 reps - Clamshell  - 1 x daily - 7 x weekly - 2 sets - 8 reps - Sidelying Reverse Clamshell  - 1 x daily - 7 x weekly - 2 sets - 8 reps - Sit to Stand with Ball Between Knees  - 1 x daily - 7 x weekly - 2 sets - 8 reps - Supine Butterfly Groin Stretch  - 1 x daily - 7 x weekly - 3 sets - hold - Child's Pose Stretch  - 1 x daily - 7 x weekly - 3 sets - hold - Supine Single Knee to Chest Stretch  - 1 x daily - 7 x weekly - 3 sets - hold  ASSESSMENT:  CLINICAL IMPRESSION: Patient is a 48 y.o. female  who was seen today for physical therapy treatment for post-menopausal related care. Since undergoing treatment for breast cancer in 2022 and being medically induced into menopause, patient has been experiencing vaginal dryness, tightness, pain during intercourse, and hemorrhoids. Progression of pelvic floor activation went very well in standing position. We progressed hip and glute strengthening today, tolerated very well. Introduction to deep core activation required moderate cueing form PT for form management but patient was able to create a strong transverse abdominis  contraction with exhalation in qped and supine. Overall, patient tolerated session well and Pt would benefit from additional PT to further address deficits.    OBJECTIVE IMPAIRMENTS: decreased coordination, decreased endurance, decreased mobility, decreased ROM, decreased strength, and pain.   ACTIVITY LIMITATIONS: vaginal penetration during intercourse/pelvic examinations   PARTICIPATION LIMITATIONS: N/A  PERSONAL FACTORS: Age, Past/current experiences, and Time since onset of injury/illness/exacerbation are also affecting patient's functional outcome.   REHAB POTENTIAL: Good  CLINICAL DECISION MAKING: Stable/uncomplicated  EVALUATION COMPLEXITY: Low   GOALS: Goals reviewed with patient? Yes  SHORT TERM GOALS: Target date:  07/09/2023  Pt will be independent with HEP.  Baseline: Goal status: INITIAL  2.  Pt will be independent with diaphragmatic breathing and down training activities in order to improve pelvic floor relaxation. Baseline:  Goal status: INITIAL  3.  Pt will be independent with use of squatty potty, relaxed toileting mechanics, and improved bowel movement techniques in order to increase ease of bowel movements and complete evacuation.   Baseline:  Goal status: INITIAL  LONG TERM GOALS: Target date: 12/12/2023  Pt will be independent with advanced HEP.  Baseline:  Goal status: INITIAL  2.  Pt will be ind with using dilators for reducing pelvic floor tension and pain management to allow patient to return to vaginal penetration without significant  pain to improve quality of life and relationship with partner.  Baseline:  Goal status: INITIAL  3.  Pt will report less than 2/10 pain with vaginal penetration in order to improve intimate relationship with partner and allow patient to participate in pelvic examinations with little to no pain. Baseline:  Goal status: INITIAL  4.  Pt will be able to correctly perform diaphragmatic breathing and appropriate pressure management in order to prevent vaginal wall laxity and improve pelvic floor A/ROM at rest so that she is able to utilize full pelvic floor active range of motion.  Baseline:  Goal status: INITIAL  PLAN:  PT FREQUENCY: 1x/week  PT DURATION: 12 weeks  PLANNED INTERVENTIONS: 97110-Therapeutic exercises, 97530- Therapeutic activity, 97112- Neuromuscular re-education, 97535- Self Care, [id number]- Manual therapy, Patient/Family education, Taping, Dry Needling, Joint mobilization, Spinal mobilization, Scar mobilization, Cryotherapy, and Moist heat  PLAN FOR NEXT SESSION: continued internal treatment in hooklying and reassess pelvic floor tension at rest, introduce pelvic floor contractions?, discuss use of dilators at home, introduce  progressive downtraining stretches, manual to ribs and perineal body   Celena JAYSON Domino, PT 08/01/2023, 4:22 PM

## 2023-08-06 ENCOUNTER — Ambulatory Visit: Payer: Self-pay | Admitting: Physical Therapy

## 2023-08-06 DIAGNOSIS — M6281 Muscle weakness (generalized): Secondary | ICD-10-CM

## 2023-08-06 DIAGNOSIS — R279 Unspecified lack of coordination: Secondary | ICD-10-CM | POA: Diagnosis not present

## 2023-08-06 NOTE — Therapy (Signed)
 OUTPATIENT PHYSICAL THERAPY FEMALE PELVIC TREATMENT   Patient Name: Kaitlin [name] MRN: [id number] DOB:December 26, 1975, 48 y.o., female Today's Date: 08/06/2023  END OF SESSION:  PT End of Session - 08/06/23 1058     Visit Number 6    Number of Visits 12    Date for PT Re-Evaluation 10/12/23    Authorization Type [employer]    PT Start Time 1015    PT Stop Time 1100    PT Time Calculation (min) 45 min    Activity Tolerance Patient tolerated treatment well    Behavior During Therapy WFL for tasks assessed/performed               Past Medical History:  Diagnosis Date   Allergy    Anal fissure    Anemia    Asthma    Breast cancer (HCC)    twice   Cardiomyopathy (HCC)    Chronic headaches    Delivery normal 11/2008   Family history of colon cancer    Family history of leukemia    Family history of melanoma    Family history of ovarian cancer    Heart murmur    Kidney stone    x of   PONV (postoperative nausea and vomiting)    S/P tonsillectomy    2000   Past Surgical History:  Procedure Laterality Date   BREAST BIOPSY Right 09/12/2022   US  RT BREAST BX W LOC DEV 1ST LESION IMG BX SPEC US  GUIDE 09/12/2022 [facility]   BREAST BIOPSY Left 01/18/2023   US  LT BREAST BX W LOC DEV 1ST LESION IMG BX SPEC US  GUIDE 01/18/2023 [facility]   BREAST CYST EXCISION Right 09/26/2022   Procedure: RIGHT BREAST CANCER RECURRENT EXCISION;  Surgeon: Ebbie Cough, MD;  Location: [facility];  Service: General;  Laterality: Right;  LMA   BREAST RECONSTRUCTION WITH PLACEMENT OF TISSUE EXPANDER AND ALLODERM Bilateral 12/06/2020   Procedure: BREAST RECONSTRUCTION WITH PLACEMENT OF TISSUE EXPANDER AND ALLODERM;  Surgeon: Arelia Filippo, MD;  Location: [facility];  Service: Plastics;  Laterality: Bilateral;   LEEP     MASTECTOMY W/ SENTINEL NODE BIOPSY Right 12/06/2020   Procedure: RIGHT MASTECTOMY WITH RIGHT AXILLARY SENTINEL LYMPH NODE BIOPSY;   Surgeon: Ebbie Cough, MD;  Location: [facility];  Service: General;  Laterality: Right;   REMOVAL OF BILATERAL TISSUE EXPANDERS WITH PLACEMENT OF BILATERAL BREAST IMPLANTS Bilateral 09/15/2021   Procedure: REMOVAL OF BILATERAL TISSUE EXPANDERS WITH PLACEMENT OF BILATERAL BREAST IMPLANTS;  Surgeon: Arelia Filippo, MD;  Location: [facility];  Service: Plastics;  Laterality: Bilateral;   tonsil removal  01/22/1998   TOTAL MASTECTOMY Left 12/06/2020   Procedure: LEFT TOTAL MASTECTOMY;  Surgeon: Ebbie Cough, MD;  Location: [facility];  Service: General;  Laterality: Left;   Patient Active Problem List   Diagnosis Date Noted   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   Malignant neoplasm of upper-outer quadrant of right breast in female, estrogen receptor positive (HCC) 12/12/2020   S/P bilateral mastectomy 12/06/2020   Genetic testing 11/17/2020   Family history of breast cancer 11/02/2020   Family history of ovarian cancer 11/02/2020   Family history of colon cancer 11/02/2020   Family history of melanoma 11/02/2020   Family history of leukemia 11/02/2020   Bilateral breast cancer (HCC) 11/01/2020   LLQ abdominal pain 04/06/2011    PCP: Claudene Pellet, MD  REFERRING PROVIDER: Odean Potts, MD  REFERRING DIAG: Z78.0 (ICD-10-CM) - Post-menopausal  THERAPY DIAG:  Unspecified lack of coordination  Muscle weakness (generalized)  Rationale for Evaluation and Treatment: Rehabilitation  ONSET DATE: 2010  SUBJECTIVE:                                                                                                                                                                                           SUBJECTIVE STATEMENT: Patient reports that she is doing well today. She had a physical this morning and it went well. No stomach pain in the past week. No issues with dilator training, still on size 2.   From  eval: Patient reports that she had breast cancer in 2022 and a local reoccurrence in Sept 2024, which led to her being placed in medical menopause in Oct of 2024. Since then, she has experienced vaginal dryness and tightness in the pelvic floor, contributing to hemorrhoids and painful intercourse. In 2010 and 2014 she had vaginal deliveries, both with tearing. She has seen a GI specialist and plans to have a colonoscopy soon. She has seen a pelvic PT for 3 visits and from that experience she has dilators (6 different sizes - has not used these yet) and cups. She has had a double mastectomy and has lots of scar tissue under the ribs, which she has used the cups for in the past. She feels like her pelvic floor gets crampy every now and again. No bladder related issues to report. She has a vibration plate and rebounder at home. She sees a chiro 1x/wk consistently.  Fluid intake: drinks a lot of water - tries to get 60-90oz   PAIN:  Are you having pain? No NPRS scale: 0/10  PRECAUTIONS: None  RED FLAGS: None   WEIGHT BEARING RESTRICTIONS: No  FALLS:  Has patient fallen in last 6 months? No  OCCUPATION: works currently with college students   ACTIVITY LEVEL : works out 5-6x/wk at [employer], walks 1 mile most days  PLOF: Independent with basic ADLs  PATIENT GOALS: decrease pelvic floor tension at rest and hemorrhoid discomfort, decrease scar tissue restriction present from mastectomy surgery and from vaginal tearing during deliveries   PERTINENT HISTORY:  LEEP vaginally 2015, double mastectomy, 3 endometrial polyps all benign in 09/2022   BOWEL MOVEMENT: Pain with bowel movement: No - will only have pain with hemorrhoids when they are present  Type of bowel movement:Type (Bristol Stool Scale) 3-4, Frequency 1-2x/day, Strain no, and Splinting no Fully empty rectum: No  Leakage: No Pads: No Fiber supplement/laxative Yes   URINATION: Pain with urination: No Fully empty bladder: Yes:    Stream: Strong Urgency:  Yes  Frequency: within normal limits  Leakage: none Pads: No  INTERCOURSE: not currently sexually active due to pain - feels like barbwire when she tries to have intercourse    Ability to have vaginal penetration No  Pain with intercourse: Pain Interrupts Intercourse DrynessYes  Marinoff Scale: 3/3  PREGNANCY: Vaginal deliveries 2 Tearing Yes: with both deliveries, first tear was lateral in nature   PROLAPSE: None  OBJECTIVE:  Note: Objective measures were completed at Evaluation unless otherwise noted.  PATIENT SURVEYS:  PFIQ-7: 9  COGNITION: Overall cognitive status: Within functional limits for tasks assessed     SENSATION: Light touch: Appears intact  LUMBAR SPECIAL TESTS:  Single leg stance test: Positive  FUNCTIONAL TESTS:  Squat test: mild dynamic knee valgus with loading, no significant lumbopelvic stiffness present   GAIT: Assistive device utilized: None Comments: mild trendelenburg gait pattern with ambulation   POSTURE: rounded shoulders, forward head, and increased thoracic kyphosis  LUMBARAROM/PROM:  A/PROM A/PROM  eval  Flexion Within normal limits   Extension Within normal limits   Right lateral flexion Within normal limits   Left lateral flexion Within normal limits   Right rotation Within normal limits   Left rotation Within normal limits    (Blank rows = not tested)  LOWER EXTREMITY ROM: within functional limits   LOWER EXTREMITY MMT: 4/5 bilateral knees and hips grossly  PALPATION:   General: no significant tenderness to palpation of bilateral adductors or hip flexors   Pelvic Alignment: within normal limits   Abdominal: upper chest breathing and abdominal bracing at rest, decreased lower rib excursion with inhalation                 External Perineal Exam: dryness noted with mild clitoral hood mobility restriction present                              Internal Pelvic Floor: Patient demonstrates normal  tone throughout the superficial and deep pelvic floor muscles bilaterally. When patient contracts her pelvic floor, muscle tension remains throughout the pelvic floor without the patient doing so on her own. She demonstrates stiffness throughout superficial and deep pelvic floor, most likely due to increased muscle tension at rest. With deep diaphragmatic breathing in hooklying, patient is able to actively lengthen/relax her pelvic floor with inhalation. No pain following today's examination.    Patient confirms identification and approves PT to assess internal pelvic floor and treatment Yes No emotional/communication barriers or cognitive limitation. Patient is motivated to learn. Patient understands and agrees with treatment goals and plan. PT explains patient will be examined in standing, sitting, and lying down to see how their muscles and joints work. When they are ready, they will be asked to remove their underwear so PT can examine their perineum. The patient is also given the option of providing their own chaperone as one is not provided in our facility. The patient also has the right and is explained the right to defer or refuse any part of the evaluation or treatment including the internal exam. With the patient's consent, PT will use one gloved finger to gently assess the muscles of the pelvic floor, seeing how well it contracts and relaxes and if there is muscle symmetry. After, the patient will get dressed and PT and patient will discuss exam findings and plan of care. PT and patient discuss plan of care, schedule, attendance policy and HEP activities.  PELVIC MMT:  MMT eval  Vaginal 4/5, 5 quick flicks, 5 second hold  Internal Anal Sphincter   External Anal Sphincter   Puborectalis   Diastasis Recti   (Blank rows = not tested)  TONE: Increased following pelvic floor muscle contraction  PROLAPSE: N/A   TODAY'S TREATMENT:                                                                                                                               DATE:   07/04/23:  Neuro re-ed: Hooklying diaphragmatic breathing with pelvic floor lengthening during inhalation and shortening during exhalation 2x10  Bridge + adductor ball squeeze + diaphragmatic breathing 2x10  Sidelying clamshell + reverse clamshell + diaphragmatic breathing 2x10  Sit to stand + adductor ball squeeze + diaphragmatic breathing 2x10  Therapeutic exercise:  Supine butterfly stretch + diaphragmatic breathing 2x55min  Childs pose + diaphragmatic breathing 2x94min  Single knee to chest stretch + diaphragmatic breathing 2x54min  Self care:  Relative anatomy and the connection between the diaphragm and pelvic floor, silicone based lubricant samples and education, vaginal moisturizer education Dilator size 1-2 education and use at home: water based lubricant, positioning, technique, and frequency   07/11/23:  Neuro re-ed: seated diaphragmatic breathing with pelvic floor lengthening during inhalation and shortening during exhalation 2x10  Bridge + single leg kick out + diaphragmatic breathing 2x10 alternating  Seated ball squeeze + internal rotation (RTB) + diaphragmatic breathing 2x10 alternating  Reverse lunges holding 5# weights + diaphragmatic breathing 2x10  Standing lat pull down + single leg march + diaphragmatic breathing (RTB) 2x10  Therapeutic exercise:  Supine butterfly stretch + diaphragmatic breathing 2x52min  Childs pose + diaphragmatic breathing 2x46min  Single knee to chest stretch + diaphragmatic breathing 2x56min  Lower trunk rotations + diaphragmatic breathing 2x50min Self care:  Relative anatomy and the connection between the diaphragm and pelvic floor, silicone based lubricant samples and education, vaginal moisturizer education Dilator size 1-2 education and use at home: water based lubricant, positioning, technique, and frequency   08/06/23: Neuro re-ed: standing diaphragmatic breathing with  pelvic floor lengthening during inhalation and shortening during exhalation 2x10  Single leg bridge + diaphragmatic breathing 2x10  Primal push up + diaphragmatic breathing 2x10  Bird dog knee taps + diaphragmatic breathing 2x10  Tall kneeling reverse chop holding 5# weight + diaphragmatic breathing 2x10 each side  Half clam at wall (GTB) + diaphragmatic breathing 2x10  Squat holding 10# KB + diaphragmatic breathing 2x10  Therapeutic exercise:  Supine butterfly stretch + diaphragmatic breathing 2x57min  Childs pose + diaphragmatic breathing 2x48min  Single knee to chest stretch + diaphragmatic breathing 2x13min  Lower trunk rotations + diaphragmatic breathing 2x42min Manual therapy: Scar tissue mobilization using cup over left lower ribs/upper abdominal quadrant to decrease scar tissue restriction  Self care:  Relative anatomy and the connection between the diaphragm and pelvic floor, silicone based lubricant samples and education, vaginal moisturizer education Dilator size 1-2 education and use  at home: water based lubricant, positioning, technique, and frequency   PATIENT EDUCATION:  Education details: Relative anatomy and the connection between the diaphragm and pelvic floor, silicone based lubricant samples and education, vaginal moisturizer education Person educated: Patient Education method: Explanation, Demonstration, Tactile cues, Verbal cues, and Handouts Education comprehension: verbalized understanding, returned demonstration, verbal cues required, tactile cues required, and needs further education  HOME EXERCISE PROGRAM: Access Code: [email or web address] URL: [email or web address] Date: 08/06/2023 Prepared by: Celena Domino  Exercises - Standing Pelvic Floor Contraction  - 1 x daily - 7 x weekly - 2 sets - 10 reps - Single Leg Bridge  - 1 x daily - 7 x weekly - 2 sets - 8-10 reps - Primal Push Up  - 1 x daily - 7 x weekly - 2 sets - 12 reps - Bird Dog with Knee Taps  - 1 x  daily - 7 x weekly - 2 sets - 12 reps - Half Kneeling Chop with Medicine Ball  - 1 x daily - 7 x weekly - 2 sets - 10 reps - Standing Clam with Resistance Loop  - 1 x daily - 7 x weekly - 2 sets - 12 reps - Resistance Pulldown with March  - 1 x daily - 7 x weekly - 2 sets - 10 reps - Goblet Squat with Kettlebell  - 1 x daily - 7 x weekly - 2 sets - 10 reps - Supine Butterfly Groin Stretch  - 1 x daily - 7 x weekly - 2 sets - hold - Child's Pose Stretch  - 1 x daily - 7 x weekly - 2 sets - hold - Supine Single Knee to Chest Stretch  - 1 x daily - 7 x weekly - 2 sets - hold - Supine Lower Trunk Rotation  - 1 x daily - 7 x weekly - 2 sets - 10 reps  ASSESSMENT:  CLINICAL IMPRESSION: Patient is a 48 y.o. female  who was seen today for physical therapy treatment for post-menopausal related care. Since undergoing treatment for breast cancer in 2022 and being medically induced into menopause, patient has been experiencing vaginal dryness, tightness, pain during intercourse, and hemorrhoids. Manual to abdominal scar decreased feeling of restriction with deep diaphragmatic breathing. We progressed hip and glute strengthening today, tolerated very well. Progression of deep core training required moderate cueing form PT for form management but patient was able to create a strong transverse abdominis contraction in varying positions. Overall, patient tolerated session well and Pt would benefit from additional PT to further address deficits.    OBJECTIVE IMPAIRMENTS: decreased coordination, decreased endurance, decreased mobility, decreased ROM, decreased strength, and pain.   ACTIVITY LIMITATIONS: vaginal penetration during intercourse/pelvic examinations   PARTICIPATION LIMITATIONS: N/A  PERSONAL FACTORS: Age, Past/current experiences, and Time since onset of injury/illness/exacerbation are also affecting patient's functional outcome.   REHAB POTENTIAL: Good  CLINICAL DECISION MAKING:  Stable/uncomplicated  EVALUATION COMPLEXITY: Low   GOALS: Goals reviewed with patient? Yes  SHORT TERM GOALS: Target date: 07/09/2023  Pt will be independent with HEP.  Baseline: Goal status: INITIAL  2.  Pt will be independent with diaphragmatic breathing and down training activities in order to improve pelvic floor relaxation. Baseline:  Goal status: INITIAL  3.  Pt will be independent with use of squatty potty, relaxed toileting mechanics, and improved bowel movement techniques in order to increase ease of bowel movements and complete evacuation.   Baseline:  Goal status: INITIAL  LONG TERM GOALS:  Target date: 12/12/2023  Pt will be independent with advanced HEP.  Baseline:  Goal status: INITIAL  2.  Pt will be ind with using dilators for reducing pelvic floor tension and pain management to allow patient to return to vaginal penetration without significant pain to improve quality of life and relationship with partner.  Baseline:  Goal status: INITIAL  3.  Pt will report less than 2/10 pain with vaginal penetration in order to improve intimate relationship with partner and allow patient to participate in pelvic examinations with little to no pain. Baseline:  Goal status: INITIAL  4.  Pt will be able to correctly perform diaphragmatic breathing and appropriate pressure management in order to prevent vaginal wall laxity and improve pelvic floor A/ROM at rest so that she is able to utilize full pelvic floor active range of motion.  Baseline:  Goal status: INITIAL  PLAN:  PT FREQUENCY: 1x/week  PT DURATION: 12 weeks  PLANNED INTERVENTIONS: 97110-Therapeutic exercises, 97530- Therapeutic activity, 97112- Neuromuscular re-education, 97535- Self Care, [id number]- Manual therapy, Patient/Family education, Taping, Dry Needling, Joint mobilization, Spinal mobilization, Scar mobilization, Cryotherapy, and Moist heat  PLAN FOR NEXT SESSION: continued internal treatment in hooklying  and reassess pelvic floor tension at rest, introduce pelvic floor contractions?, discuss use of dilators at home, introduce progressive downtraining stretches, manual to ribs and perineal body   Celena JAYSON Domino, PT 08/06/2023, 10:58 AM

## 2023-08-13 NOTE — Assessment & Plan Note (Signed)
 12/06/20: Bilateral Mastectomies:  Left Mastectomy:HG DCIS 2.2 cm with necrosis, Margins Neg ER 90%, PR 40% Right Mastectomy: Grade 2 IDC with DCIS 3.8 cm, 1/3 LN ER 100%, PR 100%, Her 2 Neg, Ki 67: 2-15%   Treatment Plan: 1. Mammaprint: Luminal type a low risk 2. Adjuvant XRT completed 03/14/2021 3. Adjuvant Anti-estrogen therapy with tamoxifen  to start 03/22/2021-08/29/22 4. Recurrence:  Right breast palpable mass: Ultrasound: 6 mm, biopsy: Grade 2 IDC ER 95%, PR 95%, Ki67 30%, HER2 2+ by IHC FISH negative 09/25/2022: PET/CT: Benign --------------------------------------------------------------------------------------------------------------------- Treatment plan: Right lumpectomy: 09/26/2022: Recurrent IDC grade 2 measuring 0.8 cm, margins negative,ER 95%, PR 95%, Ki67 30%, HER2 2+ by IHC FISH negative Adjuvant ovarian function suppression with anastrozole .  Along with Verzinio started 11/09/2022   Surveillance: guardant reveal December 2024: Negative   Abemaciclib  toxicities:  Intermittent diarrhea Constipation causing hemorrhoidal discomfort   Hot flashes and vaginal dryness   Return to clinic every 3 months for follow-ups

## 2023-08-14 ENCOUNTER — Inpatient Hospital Stay: Admitting: Hematology and Oncology

## 2023-08-14 ENCOUNTER — Other Ambulatory Visit: Payer: Self-pay

## 2023-08-14 ENCOUNTER — Encounter: Payer: Self-pay | Admitting: Pharmacist

## 2023-08-14 ENCOUNTER — Inpatient Hospital Stay

## 2023-08-14 ENCOUNTER — Inpatient Hospital Stay: Attending: Hematology and Oncology

## 2023-08-14 ENCOUNTER — Other Ambulatory Visit: Payer: Self-pay | Admitting: Pharmacist

## 2023-08-14 VITALS — BP 109/66 | HR 60 | Temp 98.9°F | Resp 16 | Wt 137.2 lb

## 2023-08-14 VITALS — BP 109/66 | HR 57 | Temp 98.9°F | Resp 16

## 2023-08-14 DIAGNOSIS — M8580 Other specified disorders of bone density and structure, unspecified site: Secondary | ICD-10-CM | POA: Insufficient documentation

## 2023-08-14 DIAGNOSIS — Z1732 Human epidermal growth factor receptor 2 negative status: Secondary | ICD-10-CM | POA: Insufficient documentation

## 2023-08-14 DIAGNOSIS — Z170 Estrogen receptor positive status [ER+]: Secondary | ICD-10-CM

## 2023-08-14 DIAGNOSIS — Z87442 Personal history of urinary calculi: Secondary | ICD-10-CM | POA: Diagnosis not present

## 2023-08-14 DIAGNOSIS — K529 Noninfective gastroenteritis and colitis, unspecified: Secondary | ICD-10-CM | POA: Diagnosis not present

## 2023-08-14 DIAGNOSIS — C50912 Malignant neoplasm of unspecified site of left female breast: Secondary | ICD-10-CM | POA: Diagnosis not present

## 2023-08-14 DIAGNOSIS — Z79811 Long term (current) use of aromatase inhibitors: Secondary | ICD-10-CM | POA: Diagnosis not present

## 2023-08-14 DIAGNOSIS — C50911 Malignant neoplasm of unspecified site of right female breast: Secondary | ICD-10-CM | POA: Diagnosis not present

## 2023-08-14 DIAGNOSIS — Z5111 Encounter for antineoplastic chemotherapy: Secondary | ICD-10-CM | POA: Diagnosis present

## 2023-08-14 DIAGNOSIS — D0512 Intraductal carcinoma in situ of left breast: Secondary | ICD-10-CM | POA: Insufficient documentation

## 2023-08-14 DIAGNOSIS — Z880 Allergy status to penicillin: Secondary | ICD-10-CM | POA: Diagnosis not present

## 2023-08-14 DIAGNOSIS — K5900 Constipation, unspecified: Secondary | ICD-10-CM | POA: Insufficient documentation

## 2023-08-14 DIAGNOSIS — C50411 Malignant neoplasm of upper-outer quadrant of right female breast: Secondary | ICD-10-CM | POA: Diagnosis present

## 2023-08-14 DIAGNOSIS — R232 Flushing: Secondary | ICD-10-CM | POA: Insufficient documentation

## 2023-08-14 DIAGNOSIS — K649 Unspecified hemorrhoids: Secondary | ICD-10-CM | POA: Diagnosis not present

## 2023-08-14 DIAGNOSIS — Z1721 Progesterone receptor positive status: Secondary | ICD-10-CM | POA: Diagnosis not present

## 2023-08-14 DIAGNOSIS — Z881 Allergy status to other antibiotic agents status: Secondary | ICD-10-CM | POA: Diagnosis not present

## 2023-08-14 DIAGNOSIS — K1379 Other lesions of oral mucosa: Secondary | ICD-10-CM | POA: Insufficient documentation

## 2023-08-14 DIAGNOSIS — Z79899 Other long term (current) drug therapy: Secondary | ICD-10-CM | POA: Diagnosis not present

## 2023-08-14 DIAGNOSIS — Z9012 Acquired absence of left breast and nipple: Secondary | ICD-10-CM | POA: Insufficient documentation

## 2023-08-14 LAB — CBC WITH DIFFERENTIAL (CANCER CENTER ONLY)
Abs Immature Granulocytes: 0.01 K/uL (ref 0.00–0.07)
Basophils Absolute: 0.1 K/uL (ref 0.0–0.1)
Basophils Relative: 2 %
Eosinophils Absolute: 0.1 K/uL (ref 0.0–0.5)
Eosinophils Relative: 3 %
HCT: 35.6 % — ABNORMAL LOW (ref 36.0–46.0)
Hemoglobin: 12.2 g/dL (ref 12.0–15.0)
Immature Granulocytes: 0 %
Lymphocytes Relative: 22 %
Lymphs Abs: 0.8 K/uL (ref 0.7–4.0)
MCH: 32.4 pg (ref 26.0–34.0)
MCHC: 34.3 g/dL (ref 30.0–36.0)
MCV: 94.7 fL (ref 80.0–100.0)
Monocytes Absolute: 0.4 K/uL (ref 0.1–1.0)
Monocytes Relative: 11 %
Neutro Abs: 2.4 K/uL (ref 1.7–7.7)
Neutrophils Relative %: 62 %
Platelet Count: 251 K/uL (ref 150–400)
RBC: 3.76 MIL/uL — ABNORMAL LOW (ref 3.87–5.11)
RDW: 14.6 % (ref 11.5–15.5)
WBC Count: 3.8 K/uL — ABNORMAL LOW (ref 4.0–10.5)
nRBC: 0 % (ref 0.0–0.2)

## 2023-08-14 LAB — CMP (CANCER CENTER ONLY)
ALT: 20 U/L (ref 0–44)
AST: 26 U/L (ref 15–41)
Albumin: 4.6 g/dL (ref 3.5–5.0)
Alkaline Phosphatase: 57 U/L (ref 38–126)
Anion gap: 4 — ABNORMAL LOW (ref 5–15)
BUN: 16 mg/dL (ref 6–20)
CO2: 30 mmol/L (ref 22–32)
Calcium: 9.6 mg/dL (ref 8.9–10.3)
Chloride: 104 mmol/L (ref 98–111)
Creatinine: 0.89 mg/dL (ref 0.44–1.00)
GFR, Estimated: >60 mL/min (ref >60)
Glucose, Bld: 91 mg/dL (ref 70–99)
Potassium: 4.2 mmol/L (ref 3.5–5.1)
Sodium: 138 mmol/L (ref 135–145)
Total Bilirubin: 0.5 mg/dL (ref 0.0–1.2)
Total Protein: 7.3 g/dL (ref 6.5–8.1)

## 2023-08-14 MED ORDER — GOSERELIN ACETATE 3.6 MG ~~LOC~~ IMPL
3.6000 mg | DRUG_IMPLANT | Freq: Once | SUBCUTANEOUS | Status: AC
Start: 2023-08-14 — End: 2023-08-14
  Administered 2023-08-14: 3.6 mg via SUBCUTANEOUS
  Filled 2023-08-14: qty 3.6

## 2023-08-14 NOTE — Progress Notes (Signed)
 Patient Care Team: Claudene Pellet, MD as PCP - General (Family Medicine) Odean Potts, MD as Consulting Physician (Hematology and Oncology) Arelia Filippo, MD as Consulting Physician (Plastic Surgery) Dewey Rush, MD as Consulting Physician (Radiation Oncology) Ebbie Cough, MD as Consulting Physician (General Surgery) Lucila Rush LABOR, RPH-CPP as Pharmacist (Hematology and Oncology)  DIAGNOSIS:  Encounter Diagnosis  Name Primary?   Bilateral malignant neoplasm of breast in female, estrogen receptor positive, unspecified site of breast (HCC) Yes    SUMMARY OF ONCOLOGIC HISTORY: Oncology History  Bilateral breast cancer (HCC)  11/01/2020 Initial Diagnosis   Right breast biopsy UOQ 9:30 position: Grade 2 IDC with DCIS ER 100%, PR [place], Ki-67 2%, HER2 negative Right breast biopsy LIQ: Grade 1 IDC with DCIS Left breast biopsy UOQ: DCIS intermediate grade    11/01/2020 Cancer Staging   Staging form: Breast, AJCC 8th Edition - Clinical: Stage IIA (cT3, cN0, cM0, G2, ER+, PR+, HER2-) - Signed by Odean Potts, MD on 11/01/2020 Stage prefix: Initial diagnosis Histologic grading system: 3 grade system   11/16/2020 Genetic Testing   Negative genetic testing on the CancerNext-Expanded+RNAinsight.  The report date is 11/16/2020  The CancerNext-Expanded gene panel offered by [facility] and includes sequencing and rearrangement analysis for the following 77 genes: AIP, ALK, APC*, ATM*, AXIN2, BAP1, BARD1, BLM, BMPR1A, BRCA1*, BRCA2*, BRIP1*, CDC73, CDH1*, CDK4, CDKN1B, CDKN2A, CHEK2*, CTNNA1, DICER1, FANCC, FH, FLCN, GALNT12, KIF1B, LZTR1, MAX, MEN1, MET, MLH1*, MSH2*, MSH3, MSH6*, MUTYH*, NBN, NF1*, NF2, NTHL1, PALB2*, PHOX2B, PMS2*, POT1, PRKAR1A, PTCH1, PTEN*, RAD51C*, RAD51D*, RB1, RECQL, RET, SDHA, SDHAF2, SDHB, SDHC, SDHD, SMAD4, SMARCA4, SMARCB1, SMARCE1, STK11, SUFU, TMEM127, TP53*, TSC1, TSC2, VHL and XRCC2 (sequencing and deletion/duplication); EGFR, EGLN1, HOXB13,  KIT, MITF, PDGFRA, POLD1, and POLE (sequencing only); EPCAM and GREM1 (deletion/duplication only). DNA and RNA analyses performed for * genes.    12/06/2020 Surgery   Bilateral Mastectomies:  Left Mastectomy:HG DCIS 2.2 cm with necrosis, Margins Neg ER 90%, PR 40% Right Mastectomy: Grade 2 IDC with DCIS 3.8 cm, 1/3 LN ER 100%, PR 100%, Her 2 Neg, Ki 67: 2-15%   12/13/2020 Miscellaneous   MammaPrint: Luminal type A, low risk   01/27/2021 - 03/14/2021 Radiation Therapy   Site Technique Total Dose (Gy) Dose per Fx (Gy) Completed Fx Beam Energies  Chest Wall, Right: CW_R 3D 50.4/50.4 1.8 28/28 6XFFF  Chest Wall, Right: CW_R_SCLV 3D 50.4/50.4 1.8 28/28 6X, 10X  Chest Wall, Right: CW_R_Bst Electron 10/10 2 5/5 6E     03/22/2021 -  Anti-estrogen oral therapy   Adjuvant tamoxifen    Ductal carcinoma in situ (DCIS) of left breast  01/03/2021 Initial Diagnosis   Ductal carcinoma in situ (DCIS) of left breast   06/21/2021 Cancer Staging   Staging form: Breast, AJCC 8th Edition - Clinical: Stage 0 (cTis (DCIS), cN0, cM0, ER+, PR+) - Signed by Crawford Morna Pickle, NP on 06/21/2021     CHIEF COMPLIANT: Follow-up on Verzinio  HISTORY OF PRESENT ILLNESS:   History of Present Illness Kaitlin [name] is a 48 year old female who presents for follow-up regarding her Verzenio  treatment.  She has been on Verzenio  100 mg since October, initially causing diarrhea and mouth sores. After a trip to [place], she experienced severe diarrhea for seven days, which improved with a dose reduction to 50 mg twice daily. A recent colonoscopy showed no signs of microscopic colitis.  She experiences ankle soreness after switching to a different manufacturer of anastrozole , which she has been taking since October. Symptoms  appeared after seven days on the new manufacturer. She is considering switching back to her previous manufacturer.  She reports menstrual-like cramps near the Zoladex  injection site and has  requested an estradiol  test to assess hormone levels. She remains active with daily walking and rebounder exercises. She manages osteopenia with vitamin D supplements and is cautious with calcium intake due to a history of kidney stones. No current gastrointestinal issues after adjusting her Verzenio  dose.     ALLERGIES:  is allergic to ceclor [cefaclor], cephalosporins, rizatriptan, amoxicillin, and penicillins.  MEDICATIONS:  Current Outpatient Medications  Medication Sig Dispense Refill   abemaciclib  (VERZENIO ) 50 MG tablet Take 1 tablet (50 mg total) by mouth 2 (two) times daily. Swallow tablets whole. Do not chew, crush, or split tablets before swallowing. 56 tablet 5   anastrozole  (ARIMIDEX ) 1 MG tablet Take 1 tablet (1 mg total) by mouth daily. 90 tablet 3   goserelin (ZOLADEX ) 3.6 MG injection Inject 3.6 mg into the skin.     ivermectin (STROMECTOL) 3 MG TABS tablet Take 12 mg by mouth once. Twice a week (Monday, Thursday)     lidocaine -prilocaine  (EMLA ) cream Apply 1 Application topically as needed. 30 g 0   Magnesium Glycinate 120 MG CAPS Take 240 mg by mouth at bedtime.     MISC NATURAL PRODUCTS PO Take by mouth daily. Super Digestive Enzymes by [employer]     Multiple Vitamins-Minerals (PREVENT) CAPS Take by mouth.     Omega-3 Fatty Acids (FISH OIL) 1000 MG CAPS Take 1,000 mg by mouth.     ondansetron  (ZOFRAN ) 4 MG tablet Take 1 tablet (4 mg total) by mouth every 6 (six) hours as needed for nausea. 20 tablet 0   OVER THE COUNTER MEDICATION [place] therapy : twice a week 2.5mg      Probiotic, Lactobacillus, CAPS Take 1 tablet by mouth daily.     Sodium Sulfate-Mag Sulfate-KCl (SUTAB ) 1479-225-188 MG TABS Use as directed for colonoscopy. MANUFACTURER CODES!! BIN: [id number] PCN: CN GROUP: [id number] MEMBER ID: [id number];MLW AS SECONDARY INSURANCE ;NO PRIOR AUTHORIZATION 24 tablet 0   Vitamin D-Vitamin K (VITAMIN K2-VITAMIN D3 PO) Take by mouth daily. Vitamin D: 1.25 mcg / Vitamin K:  90 mcg     No current facility-administered medications for this visit.    PHYSICAL EXAMINATION: ECOG PERFORMANCE STATUS: 1 - Symptomatic but completely ambulatory  Vitals:   08/14/23 1030  BP: 109/66  Pulse: 60  Resp: 16  Temp: 98.9 F (37.2 C)  SpO2: 100%   Filed Weights   08/14/23 1030  Weight: 137 lb 3.2 oz (62.2 kg)      LABORATORY DATA:  I have reviewed the data as listed    Latest Ref Rng & Units 08/14/2023    9:55 AM 06/19/2023   10:24 AM 05/22/2023   11:20 AM  CMP  Glucose 70 - 99 mg/dL 91  96  88   BUN 6 - 20 mg/dL 16  12  18    Creatinine 0.44 - 1.00 mg/dL 9.10  8.91  9.06   Sodium 135 - 145 mmol/L 138  138  139   Potassium 3.5 - 5.1 mmol/L 4.2  4.0  4.2   Chloride 98 - 111 mmol/L 104  102  106   CO2 22 - 32 mmol/L 30  27  28    Calcium 8.9 - 10.3 mg/dL 9.6  9.4  9.2   Total Protein 6.5 - 8.1 g/dL 7.3  7.3  6.8   Total Bilirubin 0.0 - 1.2  mg/dL 0.5  0.7  0.5   Alkaline Phos 38 - 126 U/L 57  59  63   AST 15 - 41 U/L 26  29  28    ALT 0 - 44 U/L 20  29  28      Lab Results  Component Value Date   WBC 3.8 (L) 08/14/2023   HGB 12.2 08/14/2023   HCT 35.6 (L) 08/14/2023   MCV 94.7 08/14/2023   PLT 251 08/14/2023   NEUTROABS 2.4 08/14/2023    ASSESSMENT & PLAN:  Bilateral breast cancer (HCC) 12/06/20: Bilateral Mastectomies:  Left Mastectomy:HG DCIS 2.2 cm with necrosis, Margins Neg ER 90%, PR 40% Right Mastectomy: Grade 2 IDC with DCIS 3.8 cm, 1/3 LN ER 100%, PR 100%, Her 2 Neg, Ki 67: 2-15%   Treatment Plan: 1. Mammaprint: Luminal type a low risk 2. Adjuvant XRT completed 03/14/2021 3. Adjuvant Anti-estrogen therapy with tamoxifen  to start 03/22/2021-08/29/22 4. Recurrence:  Right breast palpable mass: Ultrasound: 6 mm, biopsy: Grade 2 IDC ER 95%, PR 95%, Ki67 30%, HER2 2+ by IHC FISH negative 09/25/2022: PET/CT: Benign --------------------------------------------------------------------------------------------------------------------- Treatment  plan: Right lumpectomy: 09/26/2022: Recurrent IDC grade 2 measuring 0.8 cm, margins negative,ER 95%, PR 95%, Ki67 30%, HER2 2+ by IHC FISH negative Adjuvant ovarian function suppression with anastrozole .  Along with Verzinio started 11/09/2022 tolerating 50 p.o. twice daily very well   Surveillance: guardant reveal June 2025: Negative   Abemaciclib  toxicities:  Intermittent diarrhea Constipation causing hemorrhoidal discomfort Hot flashes and vaginal dryness: Recent issues with cycles: Will check estradiol  levels Muscle aches and pains: Because of change in the manufacture: She will try and obtain the previous manufacturer.   Continue with monthly injections for Zoladex  Return to clinic every 3 months for follow-ups with labs ------------------------------------- Assessment and Plan Assessment & Plan Bilateral malignant neoplasm of breast, estrogen receptor positive Current regimen of Verzenio , Arimidex , and Zoladex  is effective and generally well-tolerated. Anastrozole  switch caused ankle soreness. Considering milk thistle for liver detox; advised to check interactions. Monitoring bone health due to osteopenia. - Continue Abemaciclib  (Verzenio ) 50 mg oral bid. - Continue Anastrozole  (Arimidex ) 1 mg oral daily. - Continue Goserelin (Zoladex ) 3.6 mg injection. - Order estradiol  test to check hormone levels. - Check for drug interactions with milk thistle. - Consider alternative pharmacies for Anastrozole  to obtain the previous manufacturer. - Schedule bone density scan for October 2026. - Monitor bone density and consider Prolia if bone density decreases to -2.5.  Chronic diarrhea Diarrhea improved with Verzenio  dose reduction to 50 mg, now well-tolerated. - Continue current dose of Verzenio  50 mg oral bid.      Orders Placed This Encounter  Procedures   Estradiol , Sensitive    Standing Status:   Future    Number of Occurrences:   1    Expiration Date:   08/13/2024   The  patient has a good understanding of the overall plan. she agrees with it. she will call with any problems that may develop before the next visit here. Total time spent: 30 mins including face to face time and time spent for planning, charting and co-ordination of care   Naomi MARLA Chad, MD 08/14/23

## 2023-08-15 ENCOUNTER — Ambulatory Visit: Admitting: Physical Therapy

## 2023-08-15 DIAGNOSIS — M6281 Muscle weakness (generalized): Secondary | ICD-10-CM

## 2023-08-15 DIAGNOSIS — R279 Unspecified lack of coordination: Secondary | ICD-10-CM | POA: Diagnosis not present

## 2023-08-15 NOTE — Therapy (Signed)
 OUTPATIENT PHYSICAL THERAPY FEMALE PELVIC TREATMENT   Patient Name: Kaitlin [name] MRN: [id number] DOB:[birth year], 48 y.o., female Today's Date: 08/15/2023  END OF SESSION:  PT End of Session - 08/15/23 1227     Visit Number 7    Number of Visits 12    Date for PT Re-Evaluation 10/12/23    Authorization Type [employer]    PT Start Time 1145    PT Stop Time 1230    PT Time Calculation (min) 45 min    Activity Tolerance Patient tolerated treatment well    Behavior During Therapy WFL for tasks assessed/performed                Past Medical History:  Diagnosis Date   Allergy    Anal fissure    Anemia    Asthma    Breast cancer (HCC)    twice   Cardiomyopathy (HCC)    Chronic headaches    Delivery normal 11/2008   Family history of colon cancer    Family history of leukemia    Family history of melanoma    Family history of ovarian cancer    Heart murmur    Kidney stone    x of   PONV (postoperative nausea and vomiting)    S/P tonsillectomy    2000   Past Surgical History:  Procedure Laterality Date   BREAST BIOPSY Right 09/12/2022   US  RT BREAST BX W LOC DEV 1ST LESION IMG BX SPEC US  GUIDE 09/12/2022 [facility]   BREAST BIOPSY Left 01/18/2023   US  LT BREAST BX W LOC DEV 1ST LESION IMG BX SPEC US  GUIDE 01/18/2023 [facility]   BREAST CYST EXCISION Right 09/26/2022   Procedure: RIGHT BREAST CANCER RECURRENT EXCISION;  Surgeon: Ebbie Cough, MD;  Location: [facility];  Service: General;  Laterality: Right;  LMA   BREAST RECONSTRUCTION WITH PLACEMENT OF TISSUE EXPANDER AND ALLODERM Bilateral 12/06/2020   Procedure: BREAST RECONSTRUCTION WITH PLACEMENT OF TISSUE EXPANDER AND ALLODERM;  Surgeon: Arelia Filippo, MD;  Location: [facility];  Service: Plastics;  Laterality: Bilateral;   LEEP     MASTECTOMY W/ SENTINEL NODE BIOPSY Right 12/06/2020   Procedure: RIGHT MASTECTOMY WITH RIGHT AXILLARY SENTINEL LYMPH NODE BIOPSY;   Surgeon: Ebbie Cough, MD;  Location: [facility];  Service: General;  Laterality: Right;   REMOVAL OF BILATERAL TISSUE EXPANDERS WITH PLACEMENT OF BILATERAL BREAST IMPLANTS Bilateral 09/15/2021   Procedure: REMOVAL OF BILATERAL TISSUE EXPANDERS WITH PLACEMENT OF BILATERAL BREAST IMPLANTS;  Surgeon: Arelia Filippo, MD;  Location: [facility];  Service: Plastics;  Laterality: Bilateral;   tonsil removal  01/22/1998   TOTAL MASTECTOMY Left 12/06/2020   Procedure: LEFT TOTAL MASTECTOMY;  Surgeon: Ebbie Cough, MD;  Location: [facility];  Service: General;  Laterality: Left;   Patient Active Problem List   Diagnosis Date Noted   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   Malignant neoplasm of upper-outer quadrant of right breast in female, estrogen receptor positive (HCC) 12/12/2020   S/P bilateral mastectomy 12/06/2020   Genetic testing 11/17/2020   Family history of breast cancer 11/02/2020   Family history of ovarian cancer 11/02/2020   Family history of colon cancer 11/02/2020   Family history of melanoma 11/02/2020   Family history of leukemia 11/02/2020   Bilateral breast cancer (HCC) 11/01/2020   LLQ abdominal pain 04/06/2011    PCP: Claudene Pellet, MD  REFERRING PROVIDER: Odean Potts, MD  REFERRING DIAG: Z78.0 (ICD-10-CM) - Post-menopausal  THERAPY DIAG:  Unspecified lack of coordination  Muscle weakness (generalized)  Rationale for Evaluation and Treatment: Rehabilitation  ONSET DATE: 2010  SUBJECTIVE:                                                                                                                                                                                           SUBJECTIVE STATEMENT: Patient saw her oncologist yesterday and got her shot - feeling sore from this in the abdomen. She has continued using her fiber and she is not having any issues with bowel movements. She is  having type 4 bowel movements and no issues with them whatsoever. No urinary concerns to report. She is feeling good today. 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 She still has not had intercourse and is stuck on dilator size 2 currently. She fully consents to internal treatment today.  From eval: Patient reports that she had breast cancer in 2022 and a local reoccurrence in Sept 2024, which led to her being placed in medical menopause in Oct of 2024. Since then, she has experienced vaginal dryness and tightness in the pelvic floor, contributing to hemorrhoids and painful intercourse. In 2010 and 2014 she had vaginal deliveries, both with tearing. She has seen a GI specialist and plans to have a colonoscopy soon. She has seen a pelvic PT for 3 visits and from that experience she has dilators (6 different sizes - has not used these yet) and cups. She has had a double mastectomy and has lots of scar tissue under the ribs, which she has used the cups for in the past. She feels like her pelvic floor gets crampy every now and again. No bladder related issues to report. She has a vibration plate and rebounder at home. She sees a chiro 1x/wk consistently.  Fluid intake: drinks a lot of water - tries to get 60-90oz   PAIN:  Are you having pain? No NPRS scale: 0/10  PRECAUTIONS: None  RED FLAGS: None   WEIGHT BEARING RESTRICTIONS: No  FALLS:  Has patient fallen in last 6 months? No  OCCUPATION: works currently with college students   ACTIVITY LEVEL : works out 5-6x/wk at [employer], walks 1 mile most days  PLOF: Independent with basic ADLs  PATIENT GOALS: decrease pelvic floor tension at rest and hemorrhoid discomfort, decrease scar tissue restriction present from mastectomy surgery and from vaginal tearing during deliveries   PERTINENT HISTORY:  LEEP vaginally 2015, double  mastectomy, 3 endometrial polyps all benign in 09/2022   BOWEL MOVEMENT: Pain with bowel movement: No - will only have pain with hemorrhoids when they are present  Type of bowel movement:Type (Bristol Stool Scale) 3-4, Frequency 1-2x/day, Strain no, and Splinting no Fully empty rectum: No  Leakage: No Pads: No Fiber supplement/laxative Yes   URINATION: Pain with urination: No Fully empty bladder: Yes:   Stream: Strong Urgency: Yes  Frequency: within normal limits  Leakage: none Pads: No  INTERCOURSE: not currently sexually active due to pain - feels like barbwire when she tries to have intercourse    Ability to have vaginal penetration No  Pain with intercourse: Pain Interrupts Intercourse DrynessYes  Marinoff Scale: 3/3  PREGNANCY: Vaginal deliveries 2 Tearing Yes: with both deliveries, first tear was lateral in nature   PROLAPSE: None  OBJECTIVE:  Note: Objective measures were completed at Evaluation unless otherwise noted.  PATIENT SURVEYS:  PFIQ-7: 69  COGNITION: Overall cognitive status: Within functional limits for tasks assessed     SENSATION: Light touch: Appears intact  LUMBAR SPECIAL TESTS:  Single leg stance test: Positive  FUNCTIONAL TESTS:  Squat test: mild dynamic knee valgus with loading, no significant lumbopelvic stiffness present   GAIT: Assistive device utilized: None Comments: mild trendelenburg gait pattern with ambulation   POSTURE: rounded shoulders, forward head, and increased thoracic kyphosis  LUMBARAROM/PROM:  A/PROM A/PROM  eval  Flexion Within normal limits   Extension Within normal limits   Right lateral flexion Within normal limits   Left lateral flexion Within normal limits   Right rotation Within normal limits   Left rotation Within normal limits    (Blank rows = not tested)  LOWER EXTREMITY ROM: within functional limits   LOWER EXTREMITY MMT: 4/5 bilateral knees and hips grossly  PALPATION:   General: no  significant tenderness to palpation of bilateral adductors or hip flexors   Pelvic Alignment: within normal limits   Abdominal: upper chest breathing and abdominal bracing at rest, decreased lower rib excursion with inhalation                 External Perineal Exam: dryness noted with mild clitoral hood mobility restriction present                              Internal Pelvic Floor: Patient demonstrates normal tone throughout the superficial and deep pelvic floor muscles bilaterally. When patient contracts her pelvic floor, muscle tension remains throughout the pelvic floor without the patient doing so on her own. She demonstrates stiffness throughout superficial and deep pelvic floor, most likely due to increased muscle tension at rest. With deep diaphragmatic breathing in hooklying, patient is able to actively lengthen/relax her pelvic floor with inhalation. No pain following today's examination.    Patient confirms identification and approves PT to assess internal pelvic floor and treatment Yes No emotional/communication barriers or cognitive limitation. Patient is motivated to learn. Patient understands and agrees with treatment goals and plan. PT explains patient will be examined in standing, sitting, and lying down to see how their muscles and joints work. When they are ready, they will be asked to remove their underwear so PT can examine their perineum. The patient is also given the option of providing their own chaperone as one is not provided in our facility. The patient also has the right and is explained the right to defer or refuse any part of the evaluation or treatment  including the internal exam. With the patient's consent, PT will use one gloved finger to gently assess the muscles of the pelvic floor, seeing how well it contracts and relaxes and if there is muscle symmetry. After, the patient will get dressed and PT and patient will discuss exam findings and plan of care. PT and patient  discuss plan of care, schedule, attendance policy and HEP activities.  PELVIC MMT:   MMT eval  Vaginal 4/5, 5 quick flicks, 5 second hold  Internal Anal Sphincter   External Anal Sphincter   Puborectalis   Diastasis Recti   (Blank rows = not tested)  TONE: Increased following pelvic floor muscle contraction  PROLAPSE: N/A   TODAY'S TREATMENT:                                                                                                                              DATE:   07/11/23:  Neuro re-ed: seated diaphragmatic breathing with pelvic floor lengthening during inhalation and shortening during exhalation 2x10  Bridge + single leg kick out + diaphragmatic breathing 2x10 alternating  Seated ball squeeze + internal rotation (RTB) + diaphragmatic breathing 2x10 alternating  Reverse lunges holding 5# weights + diaphragmatic breathing 2x10  Standing lat pull down + single leg march + diaphragmatic breathing (RTB) 2x10  Therapeutic exercise:  Supine butterfly stretch + diaphragmatic breathing 2x33min  Childs pose + diaphragmatic breathing 2x19min  Single knee to chest stretch + diaphragmatic breathing 2x20min  Lower trunk rotations + diaphragmatic breathing 2x3min Self care:  Relative anatomy and the connection between the diaphragm and pelvic floor, silicone based lubricant samples and education, vaginal moisturizer education Dilator size 1-2 education and use at home: water based lubricant, positioning, technique, and frequency   08/06/23: Neuro re-ed: standing diaphragmatic breathing with pelvic floor lengthening during inhalation and shortening during exhalation 2x10  Single leg bridge + diaphragmatic breathing 2x10  Primal push up + diaphragmatic breathing 2x10  Bird dog knee taps + diaphragmatic breathing 2x10  Tall kneeling reverse chop holding 5# weight + diaphragmatic breathing 2x10 each side  Half clam at wall (GTB) + diaphragmatic breathing 2x10  Squat holding 10# KB +  diaphragmatic breathing 2x10  Therapeutic exercise:  Supine butterfly stretch + diaphragmatic breathing 2x87min  Childs pose + diaphragmatic breathing 2x76min  Single knee to chest stretch + diaphragmatic breathing 2x34min  Lower trunk rotations + diaphragmatic breathing 2x37min Manual therapy: Scar tissue mobilization using cup over left lower ribs/upper abdominal quadrant to decrease scar tissue restriction  Self care:  Relative anatomy and the connection between the diaphragm and pelvic floor, silicone based lubricant samples and education, vaginal moisturizer education Dilator size 1-2 education and use at home: water based lubricant, positioning, technique, and frequency   08/15/23: Neuro re-ed: Hooklying diaphragmatic breathing + pelvic floor lengthening with inhalation and shorteneing with exhalation 2x10  Manual therapy: Scar tissue mobilization using cup over left lower ribs/upper abdominal quadrant to decrease scar tissue restriction  Internal vaginal canal mobilization/stretching to decrease  Self care:  Relative anatomy and the connection between the diaphragm and pelvic floor, silicone based lubricant samples and education, vaginal moisturizer education Dilator size 1-2 education and use at home: water based lubricant, positioning, technique, and frequency   PATIENT EDUCATION:  Education details: Relative anatomy and the connection between the diaphragm and pelvic floor, silicone based lubricant samples and education, vaginal moisturizer education Person educated: Patient Education method: Explanation, Demonstration, Tactile cues, Verbal cues, and Handouts Education comprehension: verbalized understanding, returned demonstration, verbal cues required, tactile cues required, and needs further education  HOME EXERCISE PROGRAM: Access Code: [email or web address] URL: [email or web address] Date: 08/15/2023 Prepared by: Celena Domino  Exercises - Standing Pelvic Floor Contraction   - 1 x daily - 7 x weekly - 2 sets - 10 reps - Primal Push Up  - 1 x daily - 7 x weekly - 2 sets - 12 reps - Bird Dog with Knee Taps  - 1 x daily - 7 x weekly - 2 sets - 12 reps - Half Kneeling Chop with Medicine Ball  - 1 x daily - 7 x weekly - 2 sets - 10 reps - Standing Clam with Resistance Loop  - 1 x daily - 7 x weekly - 2 sets - 12 reps - Resistance Pulldown with March  - 1 x daily - 7 x weekly - 2 sets - 10 reps - Goblet Squat with Kettlebell  - 1 x daily - 7 x weekly - 2 sets - 10 reps - Supine Butterfly Groin Stretch  - 1 x daily - 7 x weekly - 2 sets - hold - Child's Pose Stretch  - 1 x daily - 7 x weekly - 2 sets - hold - Supine Single Knee to Chest Stretch  - 1 x daily - 7 x weekly - 2 sets - hold - Supine Lower Trunk Rotation  - 1 x daily - 7 x weekly - 2 sets - 10 reps  ASSESSMENT:  CLINICAL IMPRESSION: Patient is a 48 y.o. female  who was seen today for physical therapy treatment for post-menopausal related care. Since undergoing treatment for breast cancer in 2022 and being medically induced into menopause, patient has been experiencing vaginal dryness, tightness, pain during intercourse, and hemorrhoids. Manual to pelvic floor musculature decreased feeling of restriction with deep diaphragmatic breathing. She felt a deep stretching sensation with today's internal manual muscle release. 0/10 pain upon end of session. Overall, patient tolerated session well and Pt would benefit from additional PT to further address deficits.    OBJECTIVE IMPAIRMENTS: decreased coordination, decreased endurance, decreased mobility, decreased ROM, decreased strength, and pain.   ACTIVITY LIMITATIONS: vaginal penetration during intercourse/pelvic examinations   PARTICIPATION LIMITATIONS: N/A  PERSONAL FACTORS: Age, Past/current experiences, and Time since onset of injury/illness/exacerbation are also affecting patient's functional outcome.   REHAB POTENTIAL: Good  CLINICAL  DECISION MAKING: Stable/uncomplicated  EVALUATION COMPLEXITY: Low   GOALS: Goals reviewed with patient? Yes  SHORT TERM GOALS: Target date: 07/09/2023  Pt will be independent with HEP.  Baseline: Goal status: INITIAL  2.  Pt will be independent with diaphragmatic breathing and down training activities in order to improve pelvic floor relaxation. Baseline:  Goal status: INITIAL  3.  Pt will be independent with use of squatty potty, relaxed toileting mechanics, and improved bowel movement techniques in order to increase ease of bowel movements and complete evacuation.   Baseline:  Goal status: INITIAL  LONG TERM GOALS: Target  date: 12/12/2023  Pt will be independent with advanced HEP.  Baseline:  Goal status: INITIAL  2.  Pt will be ind with using dilators for reducing pelvic floor tension and pain management to allow patient to return to vaginal penetration without significant pain to improve quality of life and relationship with partner.  Baseline:  Goal status: INITIAL  3.  Pt will report less than 2/10 pain with vaginal penetration in order to improve intimate relationship with partner and allow patient to participate in pelvic examinations with little to no pain. Baseline:  Goal status: INITIAL  4.  Pt will be able to correctly perform diaphragmatic breathing and appropriate pressure management in order to prevent vaginal wall laxity and improve pelvic floor A/ROM at rest so that she is able to utilize full pelvic floor active range of motion.  Baseline:  Goal status: INITIAL  PLAN:  PT FREQUENCY: 1x/week  PT DURATION: 12 weeks  PLANNED INTERVENTIONS: 97110-Therapeutic exercises, 97530- Therapeutic activity, 97112- Neuromuscular re-education, 97535- Self Care, [id number]- Manual therapy, Patient/Family education, Taping, Dry Needling, Joint mobilization, Spinal mobilization, Scar mobilization, Cryotherapy, and Moist heat  PLAN FOR NEXT SESSION: continued internal  treatment in hooklying and reassess pelvic floor tension at rest, introduce pelvic floor contractions?, discuss use of dilators at home, introduce progressive downtraining stretches, manual to ribs and perineal body   Celena JAYSON Domino, PT 08/15/2023, 12:29 PM

## 2023-08-18 LAB — ESTRADIOL, ULTRA SENS: Estradiol, Sensitive: <2.5 pg/mL

## 2023-08-20 ENCOUNTER — Encounter: Payer: Self-pay | Admitting: Hematology and Oncology

## 2023-08-21 ENCOUNTER — Encounter: Payer: Self-pay | Admitting: Hematology and Oncology

## 2023-08-21 ENCOUNTER — Other Ambulatory Visit: Payer: Self-pay | Admitting: *Deleted

## 2023-08-21 ENCOUNTER — Ambulatory Visit: Admitting: Hematology and Oncology

## 2023-08-21 ENCOUNTER — Other Ambulatory Visit

## 2023-08-21 MED ORDER — ANASTROZOLE 1 MG PO TABS
1.0000 mg | ORAL_TABLET | Freq: Every day | ORAL | 3 refills | Status: AC
Start: 2023-08-21 — End: ?

## 2023-08-21 NOTE — Telephone Encounter (Signed)
 Received message from pt stating she was receiving Anastrozole  from [employer] and tolerated it well.  Pt states pharmacy changed manufacture to [employer] and pt is now experiencing joint pain and discomfort with this manufacture.  Pt requesting prescription be sent to pharmacy requesting [employer] for better tolerance.  Prescription sent.

## 2023-08-22 ENCOUNTER — Ambulatory Visit: Admitting: Physical Therapy

## 2023-08-22 DIAGNOSIS — R279 Unspecified lack of coordination: Secondary | ICD-10-CM

## 2023-08-22 DIAGNOSIS — R293 Abnormal posture: Secondary | ICD-10-CM

## 2023-08-22 DIAGNOSIS — M6281 Muscle weakness (generalized): Secondary | ICD-10-CM

## 2023-08-22 NOTE — Therapy (Signed)
 OUTPATIENT PHYSICAL THERAPY FEMALE PELVIC TREATMENT   Patient Name: Kaitlin [name] MRN: [id number] DOB:Jul 23, 1975, 48 y.o., female Today's Date: 08/22/2023  END OF SESSION:  PT End of Session - 08/22/23 1208     Visit Number 8    Number of Visits 12    Date for PT Re-Evaluation 10/12/23    Authorization Type [employer]    PT Start Time 1145    PT Stop Time 1230    PT Time Calculation (min) 45 min    Activity Tolerance Patient tolerated treatment well    Behavior During Therapy WFL for tasks assessed/performed                 Past Medical History:  Diagnosis Date   Allergy    Anal fissure    Anemia    Asthma    Breast cancer (HCC)    twice   Cardiomyopathy (HCC)    Chronic headaches    Delivery normal 11/2008   Family history of colon cancer    Family history of leukemia    Family history of melanoma    Family history of ovarian cancer    Heart murmur    Kidney stone    x of   PONV (postoperative nausea and vomiting)    S/P tonsillectomy    2000   Past Surgical History:  Procedure Laterality Date   BREAST BIOPSY Right 09/12/2022   US  RT BREAST BX W LOC DEV 1ST LESION IMG BX SPEC US  GUIDE 09/12/2022 [facility]   BREAST BIOPSY Left 01/18/2023   US  LT BREAST BX W LOC DEV 1ST LESION IMG BX SPEC US  GUIDE 01/18/2023 [facility]   BREAST CYST EXCISION Right 09/26/2022   Procedure: RIGHT BREAST CANCER RECURRENT EXCISION;  Surgeon: Ebbie Cough, MD;  Location: [facility];  Service: General;  Laterality: Right;  LMA   BREAST RECONSTRUCTION WITH PLACEMENT OF TISSUE EXPANDER AND ALLODERM Bilateral 12/06/2020   Procedure: BREAST RECONSTRUCTION WITH PLACEMENT OF TISSUE EXPANDER AND ALLODERM;  Surgeon: Arelia Filippo, MD;  Location: [facility];  Service: Plastics;  Laterality: Bilateral;   LEEP     MASTECTOMY W/ SENTINEL NODE BIOPSY Right 12/06/2020   Procedure: RIGHT MASTECTOMY WITH RIGHT AXILLARY SENTINEL LYMPH NODE BIOPSY;   Surgeon: Ebbie Cough, MD;  Location: [facility];  Service: General;  Laterality: Right;   REMOVAL OF BILATERAL TISSUE EXPANDERS WITH PLACEMENT OF BILATERAL BREAST IMPLANTS Bilateral 09/15/2021   Procedure: REMOVAL OF BILATERAL TISSUE EXPANDERS WITH PLACEMENT OF BILATERAL BREAST IMPLANTS;  Surgeon: Arelia Filippo, MD;  Location: [facility];  Service: Plastics;  Laterality: Bilateral;   tonsil removal  01/22/1998   TOTAL MASTECTOMY Left 12/06/2020   Procedure: LEFT TOTAL MASTECTOMY;  Surgeon: Ebbie Cough, MD;  Location: [facility];  Service: General;  Laterality: Left;   Patient Active Problem List   Diagnosis Date Noted   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   Malignant neoplasm of upper-outer quadrant of right breast in female, estrogen receptor positive (HCC) 12/12/2020   S/P bilateral mastectomy 12/06/2020   Genetic testing 11/17/2020   Family history of breast cancer 11/02/2020   Family history of ovarian cancer 11/02/2020   Family history of colon cancer 11/02/2020   Family history of melanoma 11/02/2020   Family history of leukemia 11/02/2020   Bilateral breast cancer (HCC) 11/01/2020   LLQ abdominal pain 04/06/2011    PCP: Claudene Pellet, MD  REFERRING PROVIDER: Odean Potts, MD  REFERRING DIAG: Z78.0 (ICD-10-CM) - Post-menopausal  THERAPY DIAG:  Unspecified lack of coordination  Muscle weakness (generalized)  Abnormal posture  Rationale for Evaluation and Treatment: Rehabilitation  ONSET DATE: 2010  SUBJECTIVE:                                                                                                                                                                                           SUBJECTIVE STATEMENT: Patient reports that she felt fine after last visit, no soreness in the pelvic floor. Bowel movements have been regular. No urinary concerns to report. She had to switch to  a different pharmaceutical brand for her aromatase inhibitor medication because the new one she has been using is causing joint pain. Because her joints have been sore, she has taken a break from some of her  HEP. She wishes to keep current HEP until next visit to allow her proper time with the exercises.   From eval: Patient reports that she had breast cancer in 2022 and a local reoccurrence in Sept 2024, which led to her being placed in medical menopause in Oct of 2024. Since then, she has experienced vaginal dryness and tightness in the pelvic floor, contributing to hemorrhoids and painful intercourse. In 2010 and 2014 she had vaginal deliveries, both with tearing. She has seen a GI specialist and plans to have a colonoscopy soon. She has seen a pelvic PT for 3 visits and from that experience she has dilators (6 different sizes - has not used these yet) and cups. She has had a double mastectomy and has lots of scar tissue under the ribs, which she has used the cups for in the past. She feels like her pelvic floor gets crampy every now and again. No bladder related issues to report. She has a vibration plate and rebounder at home. She sees a chiro 1x/wk consistently.  Fluid intake: drinks a lot of water - tries to get 60-90oz   PAIN:  Are you having pain? No NPRS scale: 0/10  PRECAUTIONS: None  RED FLAGS: None   WEIGHT BEARING RESTRICTIONS: No  FALLS:  Has patient fallen in last 6 months? No  OCCUPATION: works currently with college students   ACTIVITY LEVEL : works out 5-6x/wk at [employer], walks 1 mile most days  PLOF: Independent with basic ADLs  PATIENT GOALS: decrease pelvic floor tension at rest and hemorrhoid discomfort, decrease scar tissue restriction present from mastectomy surgery and from vaginal tearing during deliveries   PERTINENT HISTORY:  LEEP vaginally 2015, double mastectomy, 3 endometrial polyps all benign in 09/2022   BOWEL MOVEMENT: Pain with bowel movement:  No -  will only have pain with hemorrhoids when they are present  Type of bowel movement:Type (Bristol Stool Scale) 3-4, Frequency 1-2x/day, Strain no, and Splinting no Fully empty rectum: No  Leakage: No Pads: No Fiber supplement/laxative Yes   URINATION: Pain with urination: No Fully empty bladder: Yes:   Stream: Strong Urgency: Yes  Frequency: within normal limits  Leakage: none Pads: No  INTERCOURSE: not currently sexually active due to pain - feels like barbwire when she tries to have intercourse    Ability to have vaginal penetration No  Pain with intercourse: Pain Interrupts Intercourse DrynessYes  Marinoff Scale: 3/3  PREGNANCY: Vaginal deliveries 2 Tearing Yes: with both deliveries, first tear was lateral in nature   PROLAPSE: None  OBJECTIVE:  Note: Objective measures were completed at Evaluation unless otherwise noted.  PATIENT SURVEYS:  PFIQ-7: 101  COGNITION: Overall cognitive status: Within functional limits for tasks assessed     SENSATION: Light touch: Appears intact  LUMBAR SPECIAL TESTS:  Single leg stance test: Positive  FUNCTIONAL TESTS:  Squat test: mild dynamic knee valgus with loading, no significant lumbopelvic stiffness present   GAIT: Assistive device utilized: None Comments: mild trendelenburg gait pattern with ambulation   POSTURE: rounded shoulders, forward head, and increased thoracic kyphosis  LUMBARAROM/PROM:  A/PROM A/PROM  eval  Flexion Within normal limits   Extension Within normal limits   Right lateral flexion Within normal limits   Left lateral flexion Within normal limits   Right rotation Within normal limits   Left rotation Within normal limits    (Blank rows = not tested)  LOWER EXTREMITY ROM: within functional limits   LOWER EXTREMITY MMT: 4/5 bilateral knees and hips grossly  PALPATION:   General: no significant tenderness to palpation of bilateral adductors or hip flexors   Pelvic Alignment: within  normal limits   Abdominal: upper chest breathing and abdominal bracing at rest, decreased lower rib excursion with inhalation                 External Perineal Exam: dryness noted with mild clitoral hood mobility restriction present                              Internal Pelvic Floor: Patient demonstrates normal tone throughout the superficial and deep pelvic floor muscles bilaterally. When patient contracts her pelvic floor, muscle tension remains throughout the pelvic floor without the patient doing so on her own. She demonstrates stiffness throughout superficial and deep pelvic floor, most likely due to increased muscle tension at rest. With deep diaphragmatic breathing in hooklying, patient is able to actively lengthen/relax her pelvic floor with inhalation. No pain following today's examination.    Patient confirms identification and approves PT to assess internal pelvic floor and treatment Yes No emotional/communication barriers or cognitive limitation. Patient is motivated to learn. Patient understands and agrees with treatment goals and plan. PT explains patient will be examined in standing, sitting, and lying down to see how their muscles and joints work. When they are ready, they will be asked to remove their underwear so PT can examine their perineum. The patient is also given the option of providing their own chaperone as one is not provided in our facility. The patient also has the right and is explained the right to defer or refuse any part of the evaluation or treatment including the internal exam. With the patient's consent, PT will use one gloved finger to gently assess the  muscles of the pelvic floor, seeing how well it contracts and relaxes and if there is muscle symmetry. After, the patient will get dressed and PT and patient will discuss exam findings and plan of care. PT and patient discuss plan of care, schedule, attendance policy and HEP activities.  PELVIC MMT:   MMT eval   Vaginal 4/5, 5 quick flicks, 5 second hold  Internal Anal Sphincter   External Anal Sphincter   Puborectalis   Diastasis Recti   (Blank rows = not tested)  TONE: Increased following pelvic floor muscle contraction  PROLAPSE: N/A   TODAY'S TREATMENT:                                                                                                                              DATE:   08/06/23: Neuro re-ed: standing diaphragmatic breathing with pelvic floor lengthening during inhalation and shortening during exhalation 2x10  Single leg bridge + diaphragmatic breathing 2x10  Primal push up + diaphragmatic breathing 2x10  Bird dog knee taps + diaphragmatic breathing 2x10  Tall kneeling reverse chop holding 5# weight + diaphragmatic breathing 2x10 each side  Half clam at wall (GTB) + diaphragmatic breathing 2x10  Squat holding 10# KB + diaphragmatic breathing 2x10  Therapeutic exercise:  Supine butterfly stretch + diaphragmatic breathing 2x69min  Childs pose + diaphragmatic breathing 2x54min  Single knee to chest stretch + diaphragmatic breathing 2x29min  Lower trunk rotations + diaphragmatic breathing 2x77min Manual therapy: Scar tissue mobilization using cup over left lower ribs/upper abdominal quadrant to decrease scar tissue restriction  Self care:  Relative anatomy and the connection between the diaphragm and pelvic floor, silicone based lubricant samples and education, vaginal moisturizer education Dilator size 1-2 education and use at home: water based lubricant, positioning, technique, and frequency   08/15/23: Neuro re-ed: Hooklying diaphragmatic breathing + pelvic floor lengthening with inhalation and shorteneing with exhalation 2x10  Manual therapy: Scar tissue mobilization using cup over left lower ribs/upper abdominal quadrant to decrease scar tissue restriction  Internal vaginal canal mobilization/stretching to decrease  Self care:  Relative anatomy and the connection  between the diaphragm and pelvic floor, silicone based lubricant samples and education, vaginal moisturizer education Dilator size 1-2 education and use at home: water based lubricant, positioning, technique, and frequency   08/22/23: Neuro re-ed: Hooklying diaphragmatic breathing + pelvic floor lengthening with inhalation and shorteneing with exhalation 2x10  Manual therapy: Scar tissue mobilization using cup over left lower ribs/upper abdominal quadrant to decrease scar tissue restriction  Internal vaginal canal mobilization/stretching to decrease  Self care:  Relative anatomy and the connection between the diaphragm and pelvic floor, silicone based lubricant samples and education, vaginal moisturizer education Dilator size 1-2 education and use at home: water based lubricant, positioning, technique, and frequency   PATIENT EDUCATION:  Education details: Relative anatomy and the connection between the diaphragm and pelvic floor, silicone based lubricant samples and education, vaginal moisturizer education Person educated: Patient Education  method: Explanation, Demonstration, Tactile cues, Verbal cues, and Handouts Education comprehension: verbalized understanding, returned demonstration, verbal cues required, tactile cues required, and needs further education  HOME EXERCISE PROGRAM: Access Code: [email or web address] URL: [email or web address] Date: 08/15/2023 Prepared by: Celena Domino  Exercises - Standing Pelvic Floor Contraction  - 1 x daily - 7 x weekly - 2 sets - 10 reps - Primal Push Up  - 1 x daily - 7 x weekly - 2 sets - 12 reps - Bird Dog with Knee Taps  - 1 x daily - 7 x weekly - 2 sets - 12 reps - Half Kneeling Chop with Medicine Ball  - 1 x daily - 7 x weekly - 2 sets - 10 reps - Standing Clam with Resistance Loop  - 1 x daily - 7 x weekly - 2 sets - 12 reps - Resistance Pulldown with March  - 1 x daily - 7 x weekly - 2 sets - 10 reps - Goblet Squat with Kettlebell  - 1 x  daily - 7 x weekly - 2 sets - 10 reps - Supine Butterfly Groin Stretch  - 1 x daily - 7 x weekly - 2 sets - hold - Child's Pose Stretch  - 1 x daily - 7 x weekly - 2 sets - hold - Supine Single Knee to Chest Stretch  - 1 x daily - 7 x weekly - 2 sets - hold - Supine Lower Trunk Rotation  - 1 x daily - 7 x weekly - 2 sets - 10 reps  ASSESSMENT:  CLINICAL IMPRESSION: Patient is a 48 y.o. female  who was seen today for physical therapy treatment for post-menopausal related care. Since undergoing treatment for breast cancer in 2022 and being medically induced into menopause, patient has been experiencing vaginal dryness, tightness, pain during intercourse, and hemorrhoids. Manual to pelvic floor musculature decreased feeling of restriction with deep diaphragmatic breathing. She is most restricted around the cervix on her right side compared to left. She felt a deep stretching sensation with today's internal manual muscle release. 0/10 pain upon end of session. She felt like she could take a deeper diaphragmatic breath after manual therapy to ribs was performed. Overall, patient tolerated session well and Pt would benefit from additional PT to further address deficits.    OBJECTIVE IMPAIRMENTS: decreased coordination, decreased endurance, decreased mobility, decreased ROM, decreased strength, and pain.   ACTIVITY LIMITATIONS: vaginal penetration during intercourse/pelvic examinations   PARTICIPATION LIMITATIONS: N/A  PERSONAL FACTORS: Age, Past/current experiences, and Time since onset of injury/illness/exacerbation are also affecting patient's functional outcome.   REHAB POTENTIAL: Good  CLINICAL DECISION MAKING: Stable/uncomplicated  EVALUATION COMPLEXITY: Low   GOALS: Goals reviewed with patient? Yes  SHORT TERM GOALS: Target date: 07/09/2023  Pt will be independent with HEP.  Baseline: Goal status: INITIAL  2.  Pt will be independent with diaphragmatic breathing and  down training activities in order to improve pelvic floor relaxation. Baseline:  Goal status: INITIAL  3.  Pt will be independent with use of squatty potty, relaxed toileting mechanics, and improved bowel movement techniques in order to increase ease of bowel movements and complete evacuation.   Baseline:  Goal status: INITIAL  LONG TERM GOALS: Target date: 12/12/2023  Pt will be independent with advanced HEP.  Baseline:  Goal status: INITIAL  2.  Pt will be ind with using dilators for reducing pelvic floor tension and pain management to allow patient to return to vaginal penetration without significant pain  to improve quality of life and relationship with partner.  Baseline:  Goal status: INITIAL  3.  Pt will report less than 2/10 pain with vaginal penetration in order to improve intimate relationship with partner and allow patient to participate in pelvic examinations with little to no pain. Baseline:  Goal status: INITIAL  4.  Pt will be able to correctly perform diaphragmatic breathing and appropriate pressure management in order to prevent vaginal wall laxity and improve pelvic floor A/ROM at rest so that she is able to utilize full pelvic floor active range of motion.  Baseline:  Goal status: INITIAL  PLAN:  PT FREQUENCY: 1x/week  PT DURATION: 12 weeks  PLANNED INTERVENTIONS: 97110-Therapeutic exercises, 97530- Therapeutic activity, 97112- Neuromuscular re-education, 97535- Self Care, [id number]- Manual therapy, Patient/Family education, Taping, Dry Needling, Joint mobilization, Spinal mobilization, Scar mobilization, Cryotherapy, and Moist heat  PLAN FOR NEXT SESSION: continued internal treatment in hooklying and reassess pelvic floor tension at rest, introduce pelvic floor contractions?, discuss use of dilators at home, introduce progressive downtraining stretches, manual to ribs and perineal body   Celena JAYSON Domino, PT 08/22/2023, 12:28 PM

## 2023-08-29 ENCOUNTER — Encounter: Payer: Self-pay | Admitting: Hematology and Oncology

## 2023-09-03 ENCOUNTER — Encounter: Payer: Self-pay | Admitting: Hematology and Oncology

## 2023-09-04 ENCOUNTER — Encounter: Payer: Self-pay | Admitting: Hematology and Oncology

## 2023-09-05 ENCOUNTER — Ambulatory Visit: Payer: Self-pay | Admitting: Physical Therapy

## 2023-09-09 ENCOUNTER — Encounter: Payer: Self-pay | Admitting: Hematology and Oncology

## 2023-09-09 ENCOUNTER — Ambulatory Visit: Payer: Self-pay | Attending: Adult Health | Admitting: Physical Therapy

## 2023-09-09 DIAGNOSIS — R293 Abnormal posture: Secondary | ICD-10-CM | POA: Diagnosis present

## 2023-09-09 DIAGNOSIS — M6281 Muscle weakness (generalized): Secondary | ICD-10-CM | POA: Insufficient documentation

## 2023-09-09 DIAGNOSIS — R279 Unspecified lack of coordination: Secondary | ICD-10-CM | POA: Diagnosis present

## 2023-09-09 NOTE — Therapy (Signed)
 OUTPATIENT PHYSICAL THERAPY FEMALE PELVIC TREATMENT   Patient Name: Kaitlin [name] MRN: [id number] DOB:Apr 04, 1975, 48 y.o., female Today's Date: 09/09/2023  END OF SESSION:           Past Medical History:  Diagnosis Date   Allergy    Anal fissure    Anemia    Asthma    Breast cancer (HCC)    twice   Cardiomyopathy (HCC)    Chronic headaches    Delivery normal 11/2008   Family history of colon cancer    Family history of leukemia    Family history of melanoma    Family history of ovarian cancer    Heart murmur    Kidney stone    x of   PONV (postoperative nausea and vomiting)    S/P tonsillectomy    2000   Past Surgical History:  Procedure Laterality Date   BREAST BIOPSY Right 09/12/2022   US  RT BREAST BX W LOC DEV 1ST LESION IMG BX SPEC US  GUIDE 09/12/2022 [facility]   BREAST BIOPSY Left 01/18/2023   US  LT BREAST BX W LOC DEV 1ST LESION IMG BX SPEC US  GUIDE 01/18/2023 [facility]   BREAST CYST EXCISION Right 09/26/2022   Procedure: RIGHT BREAST CANCER RECURRENT EXCISION;  Surgeon: Ebbie Cough, MD;  Location: [facility];  Service: General;  Laterality: Right;  LMA   BREAST RECONSTRUCTION WITH PLACEMENT OF TISSUE EXPANDER AND ALLODERM Bilateral 12/06/2020   Procedure: BREAST RECONSTRUCTION WITH PLACEMENT OF TISSUE EXPANDER AND ALLODERM;  Surgeon: Arelia Filippo, MD;  Location: [facility];  Service: Plastics;  Laterality: Bilateral;   LEEP     MASTECTOMY W/ SENTINEL NODE BIOPSY Right 12/06/2020   Procedure: RIGHT MASTECTOMY WITH RIGHT AXILLARY SENTINEL LYMPH NODE BIOPSY;  Surgeon: Ebbie Cough, MD;  Location: [facility];  Service: General;  Laterality: Right;   REMOVAL OF BILATERAL TISSUE EXPANDERS WITH PLACEMENT OF BILATERAL BREAST IMPLANTS Bilateral 09/15/2021   Procedure: REMOVAL OF BILATERAL TISSUE EXPANDERS WITH PLACEMENT OF BILATERAL BREAST IMPLANTS;  Surgeon: Arelia Filippo, MD;  Location: [facility];  Service: Plastics;  Laterality: Bilateral;   tonsil removal  01/22/1998   TOTAL MASTECTOMY Left 12/06/2020   Procedure: LEFT TOTAL MASTECTOMY;  Surgeon: Ebbie Cough, MD;  Location:  SURGERY CENTER;  Service: General;  Laterality: Left;   Patient Active Problem List   Diagnosis Date Noted   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   Malignant neoplasm of upper-outer quadrant of right breast in female, estrogen receptor positive (HCC) 12/12/2020   S/P bilateral mastectomy 12/06/2020   Genetic testing 11/17/2020   Family history of breast cancer 11/02/2020   Family history of ovarian cancer 11/02/2020   Family history of colon cancer 11/02/2020   Family history of melanoma 11/02/2020   Family history of leukemia 11/02/2020   Bilateral breast cancer (HCC) 11/01/2020   LLQ abdominal pain 04/06/2011    PCP: Claudene Pellet, MD  REFERRING PROVIDER: Odean Potts, MD   REFERRING DIAG: Z78.0 (ICD-10-CM) - Post-menopausal  THERAPY DIAG:  Unspecified lack of coordination  Abnormal posture  Muscle weakness (generalized)  Rationale for Evaluation and Treatment: Rehabilitation  ONSET DATE: 2010  SUBJECTIVE:  SUBJECTIVE STATEMENT: Patient reports that she felt good after last visit, no soreness. She is having some vaginal burning which is the case before her date of injection. No urinary issues to report. She tried her dilator ~1 week ago and it felt very tight, still at dilator size 2.   From eval: Patient reports that she had breast cancer in 2022 and a local reoccurrence in Sept 2024, which led to her being placed in medical menopause in Oct of 2024. Since then, she has experienced vaginal dryness and tightness in the pelvic floor, contributing to  hemorrhoids and painful intercourse. In 2010 and 2014 she had vaginal deliveries, both with tearing. She has seen a GI specialist and plans to have a colonoscopy soon. She has seen a pelvic PT for 3 visits and from that experience she has dilators (6 different sizes - has not used these yet) and cups. She has had a double mastectomy and has lots of scar tissue under the ribs, which she has used the cups for in the past. She feels like her pelvic floor gets crampy every now and again. No bladder related issues to report. She has a vibration plate and rebounder at home. She sees a chiro 1x/wk consistently.  Fluid intake: drinks a lot of water - tries to get 60-90oz   PAIN:  Are you having pain? No NPRS scale: 0/10  PRECAUTIONS: None  RED FLAGS: None   WEIGHT BEARING RESTRICTIONS: No  FALLS:  Has patient fallen in last 6 months? No  OCCUPATION: works currently with college students   ACTIVITY LEVEL : works out 5-6x/wk at [employer], walks 1 mile most days  PLOF: Independent with basic ADLs  PATIENT GOALS: decrease pelvic floor tension at rest and hemorrhoid discomfort, decrease scar tissue restriction present from mastectomy surgery and from vaginal tearing during deliveries   PERTINENT HISTORY:  LEEP vaginally 2015, double mastectomy, 3 endometrial polyps all benign in 09/2022   BOWEL MOVEMENT: Pain with bowel movement: No - will only have pain with hemorrhoids when they are present  Type of bowel movement:Type (Bristol Stool Scale) 3-4, Frequency 1-2x/day, Strain no, and Splinting no Fully empty rectum: No  Leakage: No Pads: No Fiber supplement/laxative Yes   URINATION: Pain with urination: No Fully empty bladder: Yes:   Stream: Strong Urgency: Yes  Frequency: within normal limits  Leakage: none Pads: No  INTERCOURSE: not currently sexually active due to pain - feels like barbwire when she tries to have intercourse    Ability to have vaginal penetration No  Pain with  intercourse: Pain Interrupts Intercourse DrynessYes  Marinoff Scale: 3/3  PREGNANCY: Vaginal deliveries 2 Tearing Yes: with both deliveries, first tear was lateral in nature   PROLAPSE: None  OBJECTIVE:  Note: Objective measures were completed at Evaluation unless otherwise noted.  PATIENT SURVEYS:  PFIQ-7: 36  COGNITION: Overall cognitive status: Within functional limits for tasks assessed     SENSATION: Light touch: Appears intact  LUMBAR SPECIAL TESTS:  Single leg stance test: Positive  FUNCTIONAL TESTS:  Squat test: mild dynamic knee valgus with loading, no significant lumbopelvic stiffness present   GAIT: Assistive device utilized: None Comments: mild trendelenburg gait pattern with ambulation   POSTURE: rounded shoulders, forward head, and increased thoracic kyphosis  LUMBARAROM/PROM:  A/PROM A/PROM  eval  Flexion Within normal limits   Extension Within normal limits   Right lateral flexion Within normal limits   Left lateral flexion Within normal limits   Right rotation Within normal limits  Left rotation Within normal limits    (Blank rows = not tested)  LOWER EXTREMITY ROM: within functional limits   LOWER EXTREMITY MMT: 4/5 bilateral knees and hips grossly  PALPATION:   General: no significant tenderness to palpation of bilateral adductors or hip flexors   Pelvic Alignment: within normal limits   Abdominal: upper chest breathing and abdominal bracing at rest, decreased lower rib excursion with inhalation                 External Perineal Exam: dryness noted with mild clitoral hood mobility restriction present                              Internal Pelvic Floor: Patient demonstrates normal tone throughout the superficial and deep pelvic floor muscles bilaterally. When patient contracts her pelvic floor, muscle tension remains throughout the pelvic floor without the patient doing so on her own. She demonstrates stiffness throughout superficial and  deep pelvic floor, most likely due to increased muscle tension at rest. With deep diaphragmatic breathing in hooklying, patient is able to actively lengthen/relax her pelvic floor with inhalation. No pain following today's examination.    Patient confirms identification and approves PT to assess internal pelvic floor and treatment Yes No emotional/communication barriers or cognitive limitation. Patient is motivated to learn. Patient understands and agrees with treatment goals and plan. PT explains patient will be examined in standing, sitting, and lying down to see how their muscles and joints work. When they are ready, they will be asked to remove their underwear so PT can examine their perineum. The patient is also given the option of providing their own chaperone as one is not provided in our facility. The patient also has the right and is explained the right to defer or refuse any part of the evaluation or treatment including the internal exam. With the patient's consent, PT will use one gloved finger to gently assess the muscles of the pelvic floor, seeing how well it contracts and relaxes and if there is muscle symmetry. After, the patient will get dressed and PT and patient will discuss exam findings and plan of care. PT and patient discuss plan of care, schedule, attendance policy and HEP activities.  PELVIC MMT:   MMT eval  Vaginal 4/5, 5 quick flicks, 5 second hold  Internal Anal Sphincter   External Anal Sphincter   Puborectalis   Diastasis Recti   (Blank rows = not tested)  TONE: Increased following pelvic floor muscle contraction  PROLAPSE: N/A   TODAY'S TREATMENT:                                                                                                                              DATE:   08/15/23: Neuro re-ed: Hooklying diaphragmatic breathing + pelvic floor lengthening with inhalation and shorteneing with exhalation 2x10  Manual therapy: Scar tissue mobilization using  cup over left  lower ribs/upper abdominal quadrant to decrease scar tissue restriction  Internal vaginal canal mobilization/stretching to decrease  Self care:  Relative anatomy and the connection between the diaphragm and pelvic floor, silicone based lubricant samples and education, vaginal moisturizer education Dilator size 1-2 education and use at home: water based lubricant, positioning, technique, and frequency   08/22/23: Neuro re-ed: Hooklying diaphragmatic breathing + pelvic floor lengthening with inhalation and shorteneing with exhalation 2x10  Manual therapy: Scar tissue mobilization using cup over left lower ribs/upper abdominal quadrant to decrease scar tissue restriction  Internal vaginal canal mobilization/stretching to decrease  Self care:  Relative anatomy and the connection between the diaphragm and pelvic floor, silicone based lubricant samples and education, vaginal moisturizer education Dilator size 1-2 education and use at home: water based lubricant, positioning, technique, and frequency   09/09/23: Therapeutic exercise: NuStep level 4 - 5 min - PT present to discuss current status  Lower trunk rotations + diaphragmatic breathing  SKTC stretch + diaphragmatic breathing 2x15min Squat + GTB around knees + 15# weight + diaphragmatic breathing 2x10  Lateral banded stepping + diaphragmatic breathing (GTB) 2x10  Neuro re-ed: Hooklying diaphragmatic breathing + pelvic floor lengthening with inhalation and shorteneing with exhalation 2x10  Primal push up + shoulder taps + diaphragmatic breathing 2x10  Side plank dips + diaphragmatic breathing 2x10  Dead bug arms and legs + diaphragmatic breathing 2x10  Self care:  Relative anatomy and the connection between the diaphragm and pelvic floor, silicone based lubricant samples and education, vaginal moisturizer education Dilator size 1-2 education and use at home: water based lubricant, positioning, technique, and frequency    PATIENT EDUCATION:  Education details: Relative anatomy and the connection between the diaphragm and pelvic floor, silicone based lubricant samples and education, vaginal moisturizer education Person educated: Patient Education method: Explanation, Demonstration, Tactile cues, Verbal cues, and Handouts Education comprehension: verbalized understanding, returned demonstration, verbal cues required, tactile cues required, and needs further education  HOME EXERCISE PROGRAM: Access Code: [email or web address] URL: [email or web address] Date: 09/09/2023 Prepared by: Celena Domino  Exercises - Standing Pelvic Floor Contraction  - 1 x daily - 7 x weekly - 2 sets - 10 reps - Primal Push Up with Shoulder Taps  - 1 x daily - 7 x weekly - 2 sets - 10 reps - Dead Bug  - 1 x daily - 7 x weekly - 2 sets - 10 reps - Side Plank on Knees  - 1 x daily - 7 x weekly - 2 sets - 10 reps - Standing Clam with Resistance Loop  - 1 x daily - 7 x weekly - 2 sets - 12 reps - Side Stepping with Resistance at Thighs  - 1 x daily - 7 x weekly - 2 sets - 10 reps - Goblet Squat with Kettlebell  - 1 x daily - 7 x weekly - 2 sets - 10 reps - Resistance Pulldown with March  - 1 x daily - 7 x weekly - 2 sets - 10 reps - Supine Butterfly Groin Stretch  - 1 x daily - 7 x weekly - 2 sets - hold - Child's Pose Stretch  - 1 x daily - 7 x weekly - 2 sets - hold - Supine Single Knee to Chest Stretch  - 1 x daily - 7 x weekly - 2 sets - hold - Supine Lower Trunk Rotation  - 1 x daily - 7 x weekly - 2 sets - 10 reps - Seated Piriformis Stretch  -  1 x daily - 7 x weekly - 2 sets - :30 hold  ASSESSMENT:  CLINICAL IMPRESSION: Patient is a 48 y.o. female  who was seen today for physical therapy treatment for post-menopausal related care. Since undergoing treatment for breast cancer in 2022 and being medically induced into menopause, patient has been experiencing vaginal dryness, tightness, pain during intercourse, and  hemorrhoids. All core progressions and hip strengthening progressions went very well today and patient required minimal verbal cueing during session. Overall, patient tolerated session well and Pt would benefit from additional PT to further address deficits.    OBJECTIVE IMPAIRMENTS: decreased coordination, decreased endurance, decreased mobility, decreased ROM, decreased strength, and pain.   ACTIVITY LIMITATIONS: vaginal penetration during intercourse/pelvic examinations   PARTICIPATION LIMITATIONS: N/A  PERSONAL FACTORS: Age, Past/current experiences, and Time since onset of injury/illness/exacerbation are also affecting patient's functional outcome.   REHAB POTENTIAL: Good  CLINICAL DECISION MAKING: Stable/uncomplicated  EVALUATION COMPLEXITY: Low   GOALS: Goals reviewed with patient? Yes  SHORT TERM GOALS: Target date: 07/09/2023  Pt will be independent with HEP.  Baseline: Goal status: INITIAL  2.  Pt will be independent with diaphragmatic breathing and down training activities in order to improve pelvic floor relaxation. Baseline:  Goal status: INITIAL  3.  Pt will be independent with use of squatty potty, relaxed toileting mechanics, and improved bowel movement techniques in order to increase ease of bowel movements and complete evacuation.   Baseline:  Goal status: INITIAL  LONG TERM GOALS: Target date: 12/12/2023  Pt will be independent with advanced HEP.  Baseline:  Goal status: INITIAL  2.  Pt will be ind with using dilators for reducing pelvic floor tension and pain management to allow patient to return to vaginal penetration without significant pain to improve quality of life and relationship with partner.  Baseline:  Goal status: INITIAL  3.  Pt will report less than 2/10 pain with vaginal penetration in order to improve intimate relationship with partner and allow patient to participate in pelvic examinations with little to no pain. Baseline:  Goal status:  INITIAL  4.  Pt will be able to correctly perform diaphragmatic breathing and appropriate pressure management in order to prevent vaginal wall laxity and improve pelvic floor A/ROM at rest so that she is able to utilize full pelvic floor active range of motion.  Baseline:  Goal status: INITIAL  PLAN:  PT FREQUENCY: 1x/week  PT DURATION: 12 weeks  PLANNED INTERVENTIONS: 97110-Therapeutic exercises, 97530- Therapeutic activity, 97112- Neuromuscular re-education, 97535- Self Care, [id number]- Manual therapy, Patient/Family education, Taping, Dry Needling, Joint mobilization, Spinal mobilization, Scar mobilization, Cryotherapy, and Moist heat  PLAN FOR NEXT SESSION: continued internal treatment in hooklying and reassess pelvic floor tension at rest, introduce pelvic floor contractions?, discuss use of dilators at home, introduce progressive downtraining stretches, manual to ribs and perineal body   Celena JAYSON Domino, PT 09/09/2023, 11:46 AM

## 2023-09-10 ENCOUNTER — Encounter: Admitting: Physical Therapy

## 2023-09-11 ENCOUNTER — Inpatient Hospital Stay: Payer: Self-pay | Attending: Hematology and Oncology

## 2023-09-11 VITALS — BP 108/57 | HR 61 | Temp 98.0°F | Resp 16

## 2023-09-11 DIAGNOSIS — C50411 Malignant neoplasm of upper-outer quadrant of right female breast: Secondary | ICD-10-CM | POA: Diagnosis present

## 2023-09-11 DIAGNOSIS — Z5111 Encounter for antineoplastic chemotherapy: Secondary | ICD-10-CM | POA: Diagnosis present

## 2023-09-11 DIAGNOSIS — Z79899 Other long term (current) drug therapy: Secondary | ICD-10-CM | POA: Diagnosis not present

## 2023-09-11 DIAGNOSIS — Z170 Estrogen receptor positive status [ER+]: Secondary | ICD-10-CM | POA: Diagnosis not present

## 2023-09-11 MED ORDER — GOSERELIN ACETATE 3.6 MG ~~LOC~~ IMPL
3.6000 mg | DRUG_IMPLANT | Freq: Once | SUBCUTANEOUS | Status: AC
  Administered 2023-09-11: 3.6 mg via SUBCUTANEOUS
  Filled 2023-09-11: qty 3.6

## 2023-09-17 ENCOUNTER — Ambulatory Visit: Admitting: Physical Therapy

## 2023-09-18 ENCOUNTER — Encounter: Payer: Self-pay | Admitting: Hematology and Oncology

## 2023-09-19 ENCOUNTER — Other Ambulatory Visit: Payer: Self-pay | Admitting: Pharmacist

## 2023-09-19 DIAGNOSIS — Z170 Estrogen receptor positive status [ER+]: Secondary | ICD-10-CM

## 2023-09-27 ENCOUNTER — Other Ambulatory Visit: Payer: Self-pay | Admitting: Hematology and Oncology

## 2023-10-01 ENCOUNTER — Encounter: Payer: Self-pay | Admitting: Hematology and Oncology

## 2023-10-02 ENCOUNTER — Ambulatory Visit: Attending: Hematology and Oncology | Admitting: Physical Therapy

## 2023-10-02 DIAGNOSIS — R279 Unspecified lack of coordination: Secondary | ICD-10-CM | POA: Insufficient documentation

## 2023-10-02 DIAGNOSIS — R293 Abnormal posture: Secondary | ICD-10-CM | POA: Diagnosis present

## 2023-10-02 DIAGNOSIS — M6281 Muscle weakness (generalized): Secondary | ICD-10-CM | POA: Insufficient documentation

## 2023-10-02 NOTE — Therapy (Signed)
 OUTPATIENT PHYSICAL THERAPY FEMALE PELVIC REASSESSMENT   Patient Name: Kaitlin [name] MRN: [id number] DOB:02-13-1975, 48 y.o., female Today's Date: 10/02/2023  END OF SESSION:  PT End of Session - 10/02/23 1534     Visit Number 10    Number of Visits 18    Date for PT Re-Evaluation 11/27/23    Authorization Type [employer]    PT Start Time 0245    PT Stop Time 0330    PT Time Calculation (min) 45 min    Activity Tolerance Patient tolerated treatment well    Behavior During Therapy WFL for tasks assessed/performed                  Past Medical History:  Diagnosis Date   Allergy    Anal fissure    Anemia    Asthma    Breast cancer (HCC)    twice   Cardiomyopathy (HCC)    Chronic headaches    Delivery normal 11/2008   Family history of colon cancer    Family history of leukemia    Family history of melanoma    Family history of ovarian cancer    Heart murmur    Kidney stone    x of   PONV (postoperative nausea and vomiting)    S/P tonsillectomy    2000   Past Surgical History:  Procedure Laterality Date   BREAST BIOPSY Right 09/12/2022   US  RT BREAST BX W LOC DEV 1ST LESION IMG BX SPEC US  GUIDE 09/12/2022 [facility]   BREAST BIOPSY Left 01/18/2023   US  LT BREAST BX W LOC DEV 1ST LESION IMG BX SPEC US  GUIDE 01/18/2023 [facility]   BREAST CYST EXCISION Right 09/26/2022   Procedure: RIGHT BREAST CANCER RECURRENT EXCISION;  Surgeon: Ebbie Cough, MD;  Location: [facility];  Service: General;  Laterality: Right;  LMA   BREAST RECONSTRUCTION WITH PLACEMENT OF TISSUE EXPANDER AND ALLODERM Bilateral 12/06/2020   Procedure: BREAST RECONSTRUCTION WITH PLACEMENT OF TISSUE EXPANDER AND ALLODERM;  Surgeon: Arelia Filippo, MD;  Location: [facility];  Service: Plastics;  Laterality: Bilateral;   LEEP     MASTECTOMY W/ SENTINEL NODE BIOPSY Right 12/06/2020   Procedure: RIGHT MASTECTOMY WITH RIGHT AXILLARY SENTINEL LYMPH NODE  BIOPSY;  Surgeon: Ebbie Cough, MD;  Location: [facility];  Service: General;  Laterality: Right;   REMOVAL OF BILATERAL TISSUE EXPANDERS WITH PLACEMENT OF BILATERAL BREAST IMPLANTS Bilateral 09/15/2021   Procedure: REMOVAL OF BILATERAL TISSUE EXPANDERS WITH PLACEMENT OF BILATERAL BREAST IMPLANTS;  Surgeon: Arelia Filippo, MD;  Location: [facility];  Service: Plastics;  Laterality: Bilateral;   tonsil removal  01/22/1998   TOTAL MASTECTOMY Left 12/06/2020   Procedure: LEFT TOTAL MASTECTOMY;  Surgeon: Ebbie Cough, MD;  Location: [facility];  Service: General;  Laterality: Left;   Patient Active Problem List   Diagnosis Date Noted   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   Malignant neoplasm of upper-outer quadrant of right breast in female, estrogen receptor positive (HCC) 12/12/2020   S/P bilateral mastectomy 12/06/2020   Genetic testing 11/17/2020   Family history of breast cancer 11/02/2020   Family history of ovarian cancer 11/02/2020   Family history of colon cancer 11/02/2020   Family history of melanoma 11/02/2020   Family history of leukemia 11/02/2020   Bilateral breast cancer (HCC) 11/01/2020   LLQ abdominal pain 04/06/2011    PCP: Claudene Pellet, MD  REFERRING PROVIDER: Odean Potts,  MD   REFERRING DIAG: Z78.0 (ICD-10-CM) - Post-menopausal  THERAPY DIAG:  Unspecified lack of coordination  Abnormal posture  Muscle weakness (generalized)  Rationale for Evaluation and Treatment: Rehabilitation  ONSET DATE: 2010  SUBJECTIVE:                                                                                                                                                                                           SUBJECTIVE STATEMENT: Patient reports that she is doing well today, she is still feeling very tight in the pelvic floor. She is still too nervous to attempt full penetration. Some  burning and dryness is still present. No urinary or bowel concerns to report. She is still stuck on dilator size 2 and wishes to continue pelvic PT.   From eval: Patient reports that she had breast cancer in 2022 and a local reoccurrence in Sept 2024, which led to her being placed in medical menopause in Oct of 2024. Since then, she has experienced vaginal dryness and tightness in the pelvic floor, contributing to hemorrhoids and painful intercourse. In 2010 and 2014 she had vaginal deliveries, both with tearing. She has seen a GI specialist and plans to have a colonoscopy soon. She has seen a pelvic PT for 3 visits and from that experience she has dilators (6 different sizes - has not used these yet) and cups. She has had a double mastectomy and has lots of scar tissue under the ribs, which she has used the cups for in the past. She feels like her pelvic floor gets crampy every now and again. No bladder related issues to report. She has a vibration plate and rebounder at home. She sees a chiro 1x/wk consistently.  Fluid intake: drinks a lot of water - tries to get 60-90oz   PAIN:  Are you having pain? No NPRS scale: 0/10  With intercourse 10/02/23: 10/10 burning pain   PRECAUTIONS: None  RED FLAGS: None   WEIGHT BEARING RESTRICTIONS: No  FALLS:  Has patient fallen in last 6 months? No  OCCUPATION: works currently with college students   ACTIVITY LEVEL : works out 5-6x/wk at [employer], walks 1 mile most days  PLOF: Independent with basic ADLs  PATIENT GOALS: decrease pelvic floor tension at rest and hemorrhoid discomfort, decrease scar tissue restriction present from mastectomy surgery and from vaginal tearing during deliveries   PERTINENT HISTORY:  LEEP vaginally 2015, double mastectomy, 3 endometrial polyps all benign in 09/2022   BOWEL MOVEMENT: Pain with bowel movement: No - will only have pain with hemorrhoids when they are present  Type of bowel movement:Type (Bristol Stool  Scale) 3-4,  Frequency 1-2x/day, Strain no, and Splinting no Fully empty rectum: No  Leakage: No Pads: No Fiber supplement/laxative Yes   URINATION: Pain with urination: No Fully empty bladder: Yes:   Stream: Strong Urgency: Yes  Frequency: within normal limits  Leakage: none Pads: No  INTERCOURSE: not currently sexually active due to pain - feels like barbwire when she tries to have intercourse    Ability to have vaginal penetration No  Pain with intercourse: Pain Interrupts Intercourse DrynessYes  Marinoff Scale: 3/3  PREGNANCY: Vaginal deliveries 2 Tearing Yes: with both deliveries, first tear was lateral in nature   PROLAPSE: None  OBJECTIVE:  Note: Objective measures were completed at Evaluation unless otherwise noted.  PATIENT SURVEYS:  PFIQ-7: 4  COGNITION: Overall cognitive status: Within functional limits for tasks assessed     SENSATION: Light touch: Appears intact  LUMBAR SPECIAL TESTS:  Single leg stance test: Positive  FUNCTIONAL TESTS:  Squat test: mild dynamic knee valgus with loading, no significant lumbopelvic stiffness present   GAIT: Assistive device utilized: None Comments: mild trendelenburg gait pattern with ambulation   POSTURE: rounded shoulders, forward head, and increased thoracic kyphosis  LUMBARAROM/PROM:  A/PROM A/PROM  eval  Flexion Within normal limits   Extension Within normal limits   Right lateral flexion Within normal limits   Left lateral flexion Within normal limits   Right rotation Within normal limits   Left rotation Within normal limits    (Blank rows = not tested)  LOWER EXTREMITY ROM: within functional limits   LOWER EXTREMITY MMT: 4/5 bilateral knees and hips grossly  PALPATION:   General: no significant tenderness to palpation of bilateral adductors or hip flexors   Pelvic Alignment: within normal limits   Abdominal: upper chest breathing and abdominal bracing at rest, decreased lower rib excursion  with inhalation                 External Perineal Exam: dryness noted with mild clitoral hood mobility restriction present                              Internal Pelvic Floor: Patient demonstrates normal tone throughout the superficial and deep pelvic floor muscles bilaterally. When patient contracts her pelvic floor, muscle tension remains throughout the pelvic floor without the patient doing so on her own. She demonstrates stiffness throughout superficial and deep pelvic floor, most likely due to increased muscle tension at rest. With deep diaphragmatic breathing in hooklying, patient is able to actively lengthen/relax her pelvic floor with inhalation. No pain following today's examination.    Patient confirms identification and approves PT to assess internal pelvic floor and treatment Yes No emotional/communication barriers or cognitive limitation. Patient is motivated to learn. Patient understands and agrees with treatment goals and plan. PT explains patient will be examined in standing, sitting, and lying down to see how their muscles and joints work. When they are ready, they will be asked to remove their underwear so PT can examine their perineum. The patient is also given the option of providing their own chaperone as one is not provided in our facility. The patient also has the right and is explained the right to defer or refuse any part of the evaluation or treatment including the internal exam. With the patient's consent, PT will use one gloved finger to gently assess the muscles of the pelvic floor, seeing how well it contracts and relaxes and if there is muscle symmetry. After,  the patient will get dressed and PT and patient will discuss exam findings and plan of care. PT and patient discuss plan of care, schedule, attendance policy and HEP activities.  PELVIC MMT:   MMT eval Re-eval 10/02/23  Vaginal 4/5, 5 quick flicks, 5 second hold 5/5 with improved ability to relax   Internal Anal  Sphincter    External Anal Sphincter    Puborectalis    Diastasis Recti    (Blank rows = not tested)  TONE: Increased following pelvic floor muscle contraction  PROLAPSE: N/A   TODAY'S TREATMENT:                                                                                                                              DATE:   08/15/23: Neuro re-ed: Hooklying diaphragmatic breathing + pelvic floor lengthening with inhalation and shorteneing with exhalation 2x10  Manual therapy: Scar tissue mobilization using cup over left lower ribs/upper abdominal quadrant to decrease scar tissue restriction  Internal vaginal canal mobilization/stretching to decrease  Self care:  Relative anatomy and the connection between the diaphragm and pelvic floor, silicone based lubricant samples and education, vaginal moisturizer education Dilator size 1-2 education and use at home: water based lubricant, positioning, technique, and frequency   08/22/23: Neuro re-ed: Hooklying diaphragmatic breathing + pelvic floor lengthening with inhalation and shorteneing with exhalation 2x10  Manual therapy: Scar tissue mobilization using cup over left lower ribs/upper abdominal quadrant to decrease scar tissue restriction  Internal vaginal canal mobilization/stretching to decrease  Self care:  Relative anatomy and the connection between the diaphragm and pelvic floor, silicone based lubricant samples and education, vaginal moisturizer education Dilator size 1-2 education and use at home: water based lubricant, positioning, technique, and frequency   09/09/23: Therapeutic exercise: NuStep level 4 - 5 min - PT present to discuss current status  Lower trunk rotations + diaphragmatic breathing  SKTC stretch + diaphragmatic breathing 2x15min Squat + GTB around knees + 15# weight + diaphragmatic breathing 2x10  Lateral banded stepping + diaphragmatic breathing (GTB) 2x10  Neuro re-ed: Hooklying diaphragmatic breathing +  pelvic floor lengthening with inhalation and shorteneing with exhalation 2x10  Primal push up + shoulder taps + diaphragmatic breathing 2x10  Side plank dips + diaphragmatic breathing 2x10  Dead bug arms and legs + diaphragmatic breathing 2x10  Self care:  Relative anatomy and the connection between the diaphragm and pelvic floor, silicone based lubricant samples and education, vaginal moisturizer education Dilator size 1-2 education and use at home: water based lubricant, positioning, technique, and frequency   10/02/23:  Re-evaluation and recheck of pelvic floor via internal vaginal examination  Patient demonstrates increased tone in bilateral aspects of superficial and deep pelvic floor musculature, more so on right side than left due to birth scarring  Internal diaphragmatic breathing + pelvic floor lengthening 2x10  Internal obturator release bilaterally   PATIENT EDUCATION:  Education details: Relative anatomy and the connection between the diaphragm  and pelvic floor, silicone based lubricant samples and education, vaginal moisturizer education Person educated: Patient Education method: Explanation, Demonstration, Tactile cues, Verbal cues, and Handouts Education comprehension: verbalized understanding, returned demonstration, verbal cues required, tactile cues required, and needs further education  HOME EXERCISE PROGRAM: Access Code: [email or web address] URL: [email or web address] Date: 10/02/2023 Prepared by: Celena Domino  Exercises - Yoga Squat for Pelvic Floor Relaxation with Yoga Block  - 1 x daily - 7 x weekly - 3 sets - hold  ASSESSMENT:  CLINICAL IMPRESSION: Patient is a 48 y.o. female  who was seen today for physical therapy re-eval for post-menopausal related care. Since undergoing treatment for breast cancer in 2022 and being medically induced into menopause, patient has been experiencing vaginal dryness, tightness, pain during intercourse, and hemorrhoids. Her  urinary symptoms and bowel symptoms are overall improved, yet she still experiences pain with vaginal penetration due to muscular tightness. She is currently stuck on dilator size 2 at home and wishes to continue pelvic PT to return to pain free intercourse. Patient demonstrates increased tone in bilateral aspects of superficial and deep pelvic floor musculature, more so on right side than left due to birth scarring. She responds well to internal treatment to relax the pelvic floor musculature and would continue to benefit from skilled intervention. Overall, patient tolerated session well and Pt would benefit from additional PT to further address deficits.    OBJECTIVE IMPAIRMENTS: decreased coordination, decreased endurance, decreased mobility, decreased ROM, decreased strength, and pain.   ACTIVITY LIMITATIONS: vaginal penetration during intercourse/pelvic examinations   PARTICIPATION LIMITATIONS: N/A  PERSONAL FACTORS: Age, Past/current experiences, and Time since onset of injury/illness/exacerbation are also affecting patient's functional outcome.   REHAB POTENTIAL: Good  CLINICAL DECISION MAKING: Stable/uncomplicated  EVALUATION COMPLEXITY: Low   GOALS: Goals reviewed with patient? Yes  SHORT TERM GOALS: Target date: 10/30/2023  Pt will be independent with HEP.  Baseline: Goal status: INITIAL  2.  Pt will be independent with diaphragmatic breathing and down training activities in order to improve pelvic floor relaxation. Baseline:  Goal status: INITIAL  3.  Pt will be ind with using dilators for reducing pelvic floor tension and pain management to decrease pain during vaginal penetration and improve quality oflife.  LONG TERM GOALS: Target date: 03/31/2024  Pt will be independent with advanced HEP.  Baseline:  Goal status: INITIAL  2.  Pt will be ind with using dilator size 3 for reducing pelvic floor tension and pain management to allow patient to return to vaginal  penetration without significant pain to improve quality of life and relationship with partner.  Baseline:  Goal status: INITIAL  3.  Pt will report less than 2/10 pain with vaginal penetration in order to improve intimate relationship with partner and allow patient to participate in pelvic examinations with little to no pain. Baseline:  Goal status: INITIAL  PLAN:  PT FREQUENCY: 1x/week  PT DURATION: 8 weeks  PLANNED INTERVENTIONS: 97110-Therapeutic exercises, 97530- Therapeutic activity, 97112- Neuromuscular re-education, 97535- Self Care, [id number]- Manual therapy, Patient/Family education, Taping, Dry Needling, Joint mobilization, Spinal mobilization, Scar mobilization, Cryotherapy, and Moist heat  PLAN FOR NEXT SESSION: continued internal treatment in hooklying and reassess pelvic floor tension at rest, introduce pelvic floor contractions?, discuss use of dilators at home, introduce progressive downtraining stretches, manual to ribs and perineal body   Celena JAYSON Domino, PT 10/02/2023, 3:35 PM

## 2023-10-06 NOTE — Progress Notes (Addendum)
 Reason for Infectious Disease Consult: ? Parasitic infectin  Requesting Physician: Elinor Chad, MD  Subjective:    Patient ID: Kaitlin [name], female    DOB: 08-19-1975, 48 y.o.   MRN: [id number]  HPI  Discussed the use of AI scribe software for clinical note transcription with the patient, who gave verbal consent to proceed.  History of Present Illness   Kaitlin [name] is a 48 year old female with a history of breast cancer who presents with chronic diarrhea.  She has a history of breast cancer, for which she underwent bilateral mastectomies and a lumpectomy in 2024 due to recurrence. Her current treatment regimen includes adjuvant ovarian suppression therapy with anastrozole , Verzenio , Arimidex , and Zoladex . Efforts are being made to obtain the prior formulation of anastrozole .  She experiences chronic diarrhea and has been evaluated by an alternative medicine provider. A stool analysis conducted by this provider was positive for Isospora, Hymenolepis nana, and Dipylidium. She showed me the results from this clinic which were hand written notes of the organisms isolated above, in case of Hymenolepsis it mentiones the number of eggs seen. This provider was going to rx supplements for these--but if they are truly present and pathogens I have never heard of them being treated with supplements.        Past Medical History:  Diagnosis Date   Allergy    Anal fissure    Anemia    Asthma    Breast cancer (HCC)    twice   Cardiomyopathy (HCC)    Chronic headaches    Delivery normal 11/2008   Family history of colon cancer    Family history of leukemia    Family history of melanoma    Family history of ovarian cancer    Heart murmur    Kidney stone    x of   PONV (postoperative nausea and vomiting)    S/P tonsillectomy    2000    Past Surgical History:  Procedure Laterality Date   BREAST BIOPSY Right 09/12/2022   US  RT BREAST BX W LOC DEV 1ST LESION IMG BX SPEC US  GUIDE  09/12/2022 [facility]   BREAST BIOPSY Left 01/18/2023   US  LT BREAST BX W LOC DEV 1ST LESION IMG BX SPEC US  GUIDE 01/18/2023 [facility]   BREAST CYST EXCISION Right 09/26/2022   Procedure: RIGHT BREAST CANCER RECURRENT EXCISION;  Surgeon: Ebbie Cough, MD;  Location: [facility];  Service: General;  Laterality: Right;  LMA   BREAST RECONSTRUCTION WITH PLACEMENT OF TISSUE EXPANDER AND ALLODERM Bilateral 12/06/2020   Procedure: BREAST RECONSTRUCTION WITH PLACEMENT OF TISSUE EXPANDER AND ALLODERM;  Surgeon: Arelia Filippo, MD;  Location: [facility];  Service: Plastics;  Laterality: Bilateral;   LEEP     MASTECTOMY W/ SENTINEL NODE BIOPSY Right 12/06/2020   Procedure: RIGHT MASTECTOMY WITH RIGHT AXILLARY SENTINEL LYMPH NODE BIOPSY;  Surgeon: Ebbie Cough, MD;  Location: [facility];  Service: General;  Laterality: Right;   REMOVAL OF BILATERAL TISSUE EXPANDERS WITH PLACEMENT OF BILATERAL BREAST IMPLANTS Bilateral 09/15/2021   Procedure: REMOVAL OF BILATERAL TISSUE EXPANDERS WITH PLACEMENT OF BILATERAL BREAST IMPLANTS;  Surgeon: Arelia Filippo, MD;  Location: [facility];  Service: Plastics;  Laterality: Bilateral;   tonsil removal  01/22/1998   TOTAL MASTECTOMY Left 12/06/2020   Procedure: LEFT TOTAL MASTECTOMY;  Surgeon: Ebbie Cough, MD;  Location: [facility];  Service: General;  Laterality: Left;    Family History  Problem Relation Age  of Onset   Memory loss Mother    Ulcerative colitis Mother    Colon polyps Father    Bladder Cancer Father    Irritable bowel syndrome Sister    Leukemia Maternal Aunt 50       AML   Melanoma Maternal Aunt 16   Breast cancer Paternal Aunt 17   Colon cancer Paternal Aunt    Leukemia Paternal Aunt 3   Prostate cancer Paternal Uncle 76   Colon polyps Paternal Uncle    Cervical cancer Maternal Grandmother    Parkinson's disease Maternal Grandmother    Alzheimer's  disease Maternal Grandmother    Multiple myeloma Maternal Grandfather    Skin cancer Maternal Grandfather    Skin cancer Paternal Grandmother    Colon cancer Paternal Grandfather 44   Stroke Paternal Grandfather    Ovarian cancer Other 29       MGMs sister   Esophageal cancer Neg Hx    Rectal cancer Neg Hx    Stomach cancer Neg Hx       Social History   Socioeconomic History   Marital status: Married    Spouse name: Not on file   Number of children: 2   Years of education: Not on file   Highest education level: Not on file  Occupational History   Occupation: Housewife  Tobacco Use   Smoking status: Former    Types: Cigarettes   Smokeless tobacco: Never  Vaping Use   Vaping status: Never Used  Substance and Sexual Activity   Alcohol use: Not Currently   Drug use: No   Sexual activity: Not Currently    Partners: Male    Birth control/protection: Surgical    Comment: Husband had Vasectomy  Other Topics Concern   Not on file  Social History Narrative   Not on file   Social Drivers of Health   Financial Resource Strain: Not on file  Food Insecurity: Not on file  Transportation Needs: Not on file  Physical Activity: Not on file  Stress: Not on file  Social Connections: Unknown (10/22/2022)   Received from [employer]   Social Network    Social Network: Not on file    Allergies  Allergen Reactions   Ceclor [Cefaclor] Hives and Shortness Of Breath   Cephalosporins Anaphylaxis and Hives   Rizatriptan Anaphylaxis   Amoxicillin Hives and Rash   Penicillins Other (See Comments)    Unknown reaction      Current Outpatient Medications:    anastrozole  (ARIMIDEX ) 1 MG tablet, Take 1 tablet (1 mg total) by mouth daily., Disp: 60 tablet, Rfl: 3   goserelin (ZOLADEX ) 3.6 MG injection, Inject 3.6 mg into the skin., Disp: , Rfl:    ivermectin (STROMECTOL) 3 MG TABS tablet, Take 12 mg by mouth once. Twice a week (Monday, Thursday), Disp: , Rfl:    lidocaine -prilocaine   (EMLA ) cream, Apply 1 Application topically as needed., Disp: 30 g, Rfl: 0   Magnesium Glycinate 120 MG CAPS, Take 240 mg by mouth at bedtime., Disp: , Rfl:    MISC NATURAL PRODUCTS PO, Take by mouth daily. Super Digestive Enzymes by [employer], Disp: , Rfl:    Multiple Vitamins-Minerals (PREVENT) CAPS, Take by mouth., Disp: , Rfl:    Omega-3 Fatty Acids (FISH OIL) 1000 MG CAPS, Take 1,000 mg by mouth., Disp: , Rfl:    ondansetron  (ZOFRAN ) 4 MG tablet, Take 1 tablet (4 mg total) by mouth every 6 (six) hours as needed for nausea., Disp: 20  tablet, Rfl: 0   OVER THE COUNTER MEDICATION, [place] therapy : twice a week 2.5mg , Disp: , Rfl:    Probiotic, Lactobacillus, CAPS, Take 1 tablet by mouth daily., Disp: , Rfl:    Sodium Sulfate-Mag Sulfate-KCl (SUTAB ) [phone] MG TABS, Use as directed for colonoscopy. MANUFACTURER CODES!! BIN: [id number] PCN: CN GROUP: [id number] MEMBER ID: [id number];MLW AS SECONDARY INSURANCE ;NO PRIOR AUTHORIZATION, Disp: 24 tablet, Rfl: 0   VERZENIO  50 MG tablet, TAKE 1 TABLET BY MOUTH TWICE  DAILY SWALLOW TABLETS WHOLE (DO  NOT CRUSH OR CHEW), Disp: 56 tablet, Rfl: 5   Vitamin D-Vitamin K (VITAMIN K2-VITAMIN D3 PO), Take by mouth daily. Vitamin D: 1.25 mcg / Vitamin K: 90 mcg, Disp: , Rfl:    Review of Systems  Constitutional:  Negative for activity change, appetite change, chills, diaphoresis, fatigue, fever and unexpected weight change.  HENT:  Negative for congestion, rhinorrhea, sinus pressure, sneezing, sore throat and trouble swallowing.   Eyes:  Negative for photophobia and visual disturbance.  Respiratory:  Negative for cough, chest tightness, shortness of breath, wheezing and stridor.   Cardiovascular:  Negative for chest pain, palpitations and leg swelling.  Gastrointestinal:  Negative for abdominal distention, abdominal pain, anal bleeding, blood in stool, constipation, diarrhea, nausea and vomiting.  Genitourinary:  Negative for difficulty urinating,  dysuria, flank pain and hematuria.  Musculoskeletal:  Negative for arthralgias, back pain, gait problem, joint swelling and myalgias.  Skin:  Negative for color change, pallor, rash and wound.  Neurological:  Negative for dizziness, tremors, weakness, light-headedness and headaches.  Hematological:  Negative for adenopathy. Does not bruise/bleed easily.  Psychiatric/Behavioral:  Negative for agitation, behavioral problems, confusion, decreased concentration, dysphoric mood, sleep disturbance and suicidal ideas.        Objective:   Physical Exam Constitutional:      General: She is not in acute distress.    Appearance: Normal appearance. She is well-developed. She is not ill-appearing or diaphoretic.  HENT:     Head: Normocephalic and atraumatic.     Right Ear: Hearing and external ear normal.     Left Ear: Hearing and external ear normal.     Nose: No nasal deformity or rhinorrhea.  Eyes:     General: No scleral icterus.    Conjunctiva/sclera: Conjunctivae normal.     Right eye: Right conjunctiva is not injected.     Left eye: Left conjunctiva is not injected.     Pupils: Pupils are equal, round, and reactive to light.  Neck:     Vascular: No JVD.  Cardiovascular:     Rate and Rhythm: Normal rate and regular rhythm.     Heart sounds: S1 normal and S2 normal.  Pulmonary:     Effort: Pulmonary effort is normal. No respiratory distress.     Breath sounds: No wheezing.  Abdominal:     General: There is no distension.     Palpations: Abdomen is soft.  Musculoskeletal:        General: Normal range of motion.     Right shoulder: Normal.     Left shoulder: Normal.     Cervical back: Normal range of motion and neck supple.     Right hip: Normal.     Left hip: Normal.     Right knee: Normal.     Left knee: Normal.  Lymphadenopathy:     Head:     Right side of head: No submandibular, preauricular or posterior auricular adenopathy.     Left side of  head: No submandibular,  preauricular or posterior auricular adenopathy.     Cervical: No cervical adenopathy.     Right cervical: No superficial or deep cervical adenopathy.    Left cervical: No superficial or deep cervical adenopathy.  Skin:    General: Skin is warm and dry.     Coloration: Skin is not pale.     Findings: No abrasion, bruising, ecchymosis, erythema, lesion or rash.     Nails: There is no clubbing.  Neurological:     General: No focal deficit present.     Mental Status: She is alert and oriented to person, place, and time.     Sensory: No sensory deficit.     Coordination: Coordination normal.     Gait: Gait normal.  Psychiatric:        Attention and Perception: She is attentive.        Mood and Affect: Mood normal.        Speech: Speech normal.        Behavior: Behavior normal. Behavior is cooperative.        Thought Content: Thought content normal.        Judgment: Judgment normal.           Assessment & Plan:   Assessment and Plan    Chronic diarrhea Chronic diarrhea with stool analysis at alternative medicine provider positive for Isospora, H. nana, and Dipylidium. --resend our own stool kit for O and Parasites She DOES have a travel history to [place], [place]--after which she cam back  with Campylobacter  Breast cancer status post bilateral mastectomies and lumpectomy Breast cancer with bilateral mastectomies and recurrence treated with lumpectomy in 2024. Currently on adjuvant therapy with ovarian suppression using anastrozole , Verzenio , Arimidex , and Zoladex .      With re to the diarrhea she did have a colonoscopy with biopsies which was unrevealing  I have personally spent 45 minutes involved in face-to-face and non-face-to-face activities for this patient on the day of the visit. Professional time spent includes the following activities: Preparing to see the patient (review of tests), Obtaining and/or reviewing separately obtained history (admission/discharge record),  Performing a medically appropriate examination and/or evaluation , Ordering medications/tests/procedures, referring and communicating with other health care professionals, Documenting clinical information in the EMR, Independently interpreting results (not separately reported), Communicating results to the patient/family/caregiver, Counseling and educating the patient/family/caregiver and Care coordination (not separately reported).

## 2023-10-07 ENCOUNTER — Encounter: Payer: Self-pay | Admitting: Infectious Disease

## 2023-10-07 ENCOUNTER — Other Ambulatory Visit: Payer: Self-pay

## 2023-10-07 ENCOUNTER — Ambulatory Visit: Admitting: Infectious Disease

## 2023-10-07 VITALS — BP 116/76 | HR 67 | Temp 97.9°F | Ht 67.0 in | Wt 142.0 lb

## 2023-10-07 DIAGNOSIS — C50912 Malignant neoplasm of unspecified site of left female breast: Secondary | ICD-10-CM

## 2023-10-07 DIAGNOSIS — R197 Diarrhea, unspecified: Secondary | ICD-10-CM

## 2023-10-07 DIAGNOSIS — C50911 Malignant neoplasm of unspecified site of right female breast: Secondary | ICD-10-CM | POA: Diagnosis not present

## 2023-10-07 DIAGNOSIS — C50411 Malignant neoplasm of upper-outer quadrant of right female breast: Secondary | ICD-10-CM

## 2023-10-07 DIAGNOSIS — B89 Unspecified parasitic disease: Secondary | ICD-10-CM | POA: Diagnosis not present

## 2023-10-07 DIAGNOSIS — Z170 Estrogen receptor positive status [ER+]: Secondary | ICD-10-CM

## 2023-10-07 DIAGNOSIS — K529 Noninfective gastroenteritis and colitis, unspecified: Secondary | ICD-10-CM

## 2023-10-08 ENCOUNTER — Other Ambulatory Visit

## 2023-10-08 ENCOUNTER — Other Ambulatory Visit: Payer: Self-pay

## 2023-10-08 ENCOUNTER — Ambulatory Visit: Admitting: Physical Therapy

## 2023-10-08 DIAGNOSIS — R293 Abnormal posture: Secondary | ICD-10-CM

## 2023-10-08 DIAGNOSIS — K529 Noninfective gastroenteritis and colitis, unspecified: Secondary | ICD-10-CM

## 2023-10-08 DIAGNOSIS — R279 Unspecified lack of coordination: Secondary | ICD-10-CM

## 2023-10-08 DIAGNOSIS — B89 Unspecified parasitic disease: Secondary | ICD-10-CM

## 2023-10-08 DIAGNOSIS — M6281 Muscle weakness (generalized): Secondary | ICD-10-CM

## 2023-10-08 NOTE — Therapy (Signed)
 OUTPATIENT PHYSICAL THERAPY FEMALE PELVIC TREATMENT   Patient Name: Kaitlin [name] MRN: [id number] DOB:August 20, 1975, 48 y.o., female Today's Date: 10/08/2023  END OF SESSION:  PT End of Session - 10/08/23 1616     Visit Number 11    Number of Visits 18    Date for PT Re-Evaluation 11/27/23    Authorization Type [employer]    PT Start Time 0330    PT Stop Time 0415    PT Time Calculation (min) 45 min    Activity Tolerance Patient tolerated treatment well    Behavior During Therapy WFL for tasks assessed/performed                   Past Medical History:  Diagnosis Date   Allergy    Anal fissure    Anemia    Asthma    Breast cancer (HCC)    twice   Cardiomyopathy (HCC)    Chronic headaches    Delivery normal 11/2008   Family history of colon cancer    Family history of leukemia    Family history of melanoma    Family history of ovarian cancer    Heart murmur    Kidney stone    x of   PONV (postoperative nausea and vomiting)    S/P tonsillectomy    2000   Past Surgical History:  Procedure Laterality Date   BREAST BIOPSY Right 09/12/2022   US  RT BREAST BX W LOC DEV 1ST LESION IMG BX SPEC US  GUIDE 09/12/2022 [facility]   BREAST BIOPSY Left 01/18/2023   US  LT BREAST BX W LOC DEV 1ST LESION IMG BX SPEC US  GUIDE 01/18/2023 [facility]   BREAST CYST EXCISION Right 09/26/2022   Procedure: RIGHT BREAST CANCER RECURRENT EXCISION;  Surgeon: Ebbie Cough, MD;  Location: [facility];  Service: General;  Laterality: Right;  LMA   BREAST RECONSTRUCTION WITH PLACEMENT OF TISSUE EXPANDER AND ALLODERM Bilateral 12/06/2020   Procedure: BREAST RECONSTRUCTION WITH PLACEMENT OF TISSUE EXPANDER AND ALLODERM;  Surgeon: Arelia Filippo, MD;  Location: [facility];  Service: Plastics;  Laterality: Bilateral;   LEEP     MASTECTOMY W/ SENTINEL NODE BIOPSY Right 12/06/2020   Procedure: RIGHT MASTECTOMY WITH RIGHT AXILLARY SENTINEL LYMPH NODE  BIOPSY;  Surgeon: Ebbie Cough, MD;  Location: [facility];  Service: General;  Laterality: Right;   REMOVAL OF BILATERAL TISSUE EXPANDERS WITH PLACEMENT OF BILATERAL BREAST IMPLANTS Bilateral 09/15/2021   Procedure: REMOVAL OF BILATERAL TISSUE EXPANDERS WITH PLACEMENT OF BILATERAL BREAST IMPLANTS;  Surgeon: Arelia Filippo, MD;  Location: [facility];  Service: Plastics;  Laterality: Bilateral;   tonsil removal  01/22/1998   TOTAL MASTECTOMY Left 12/06/2020   Procedure: LEFT TOTAL MASTECTOMY;  Surgeon: Ebbie Cough, MD;  Location: [facility];  Service: General;  Laterality: Left;   Patient Active Problem List   Diagnosis Date Noted   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   Malignant neoplasm of upper-outer quadrant of right breast in female, estrogen receptor positive (HCC) 12/12/2020   S/P bilateral mastectomy 12/06/2020   Genetic testing 11/17/2020   Family history of breast cancer 11/02/2020   Family history of ovarian cancer 11/02/2020   Family history of colon cancer 11/02/2020   Family history of melanoma 11/02/2020   Family history of leukemia 11/02/2020   Bilateral breast cancer (HCC) 11/01/2020   LLQ abdominal pain 04/06/2011    PCP: Claudene Pellet, MD  REFERRING PROVIDER: Odean,  Mackey, MD   REFERRING DIAG: Z78.0 (ICD-10-CM) - Post-menopausal  THERAPY DIAG:  Unspecified lack of coordination  Abnormal posture  Muscle weakness (generalized)  Rationale for Evaluation and Treatment: Rehabilitation  ONSET DATE: 2010  SUBJECTIVE:                                                                                                                                                                                           SUBJECTIVE STATEMENT: Patient reports that her pelvis is doing well. After last time, she had some soreness but no sharp pain. No urinary or bowel concerns to report. She is still stuck  on dilator size 2.   From eval: Patient reports that she had breast cancer in 2022 and a local reoccurrence in Sept 2024, which led to her being placed in medical menopause in Oct of 2024. Since then, she has experienced vaginal dryness and tightness in the pelvic floor, contributing to hemorrhoids and painful intercourse. In 2010 and 2014 she had vaginal deliveries, both with tearing. She has seen a GI specialist and plans to have a colonoscopy soon. She has seen a pelvic PT for 3 visits and from that experience she has dilators (6 different sizes - has not used these yet) and cups. She has had a double mastectomy and has lots of scar tissue under the ribs, which she has used the cups for in the past. She feels like her pelvic floor gets crampy every now and again. No bladder related issues to report. She has a vibration plate and rebounder at home. She sees a chiro 1x/wk consistently.  Fluid intake: drinks a lot of water - tries to get 60-90oz   PAIN:  Are you having pain? No NPRS scale: 0/10  With intercourse 10/02/23: 10/10 burning pain   PRECAUTIONS: None  RED FLAGS: None   WEIGHT BEARING RESTRICTIONS: No  FALLS:  Has patient fallen in last 6 months? No  OCCUPATION: works currently with college students   ACTIVITY LEVEL : works out 5-6x/wk at [employer], walks 1 mile most days  PLOF: Independent with basic ADLs  PATIENT GOALS: decrease pelvic floor tension at rest and hemorrhoid discomfort, decrease scar tissue restriction present from mastectomy surgery and from vaginal tearing during deliveries   PERTINENT HISTORY:  LEEP vaginally 2015, double mastectomy, 3 endometrial polyps all benign in 09/2022   BOWEL MOVEMENT: Pain with bowel movement: No - will only have pain with hemorrhoids when they are present  Type of bowel movement:Type (Bristol Stool Scale) 3-4, Frequency 1-2x/day, Strain no, and Splinting no Fully empty rectum: No  Leakage: No Pads: No Fiber  supplement/laxative Yes  URINATION: Pain with urination: No Fully empty bladder: Yes:   Stream: Strong Urgency: Yes  Frequency: within normal limits  Leakage: none Pads: No  INTERCOURSE: not currently sexually active due to pain - feels like barbwire when she tries to have intercourse    Ability to have vaginal penetration No  Pain with intercourse: Pain Interrupts Intercourse DrynessYes  Marinoff Scale: 3/3  PREGNANCY: Vaginal deliveries 2 Tearing Yes: with both deliveries, first tear was lateral in nature   PROLAPSE: None  OBJECTIVE:  Note: Objective measures were completed at Evaluation unless otherwise noted.  PATIENT SURVEYS:  PFIQ-7: 15  COGNITION: Overall cognitive status: Within functional limits for tasks assessed     SENSATION: Light touch: Appears intact  LUMBAR SPECIAL TESTS:  Single leg stance test: Positive  FUNCTIONAL TESTS:  Squat test: mild dynamic knee valgus with loading, no significant lumbopelvic stiffness present   GAIT: Assistive device utilized: None Comments: mild trendelenburg gait pattern with ambulation   POSTURE: rounded shoulders, forward head, and increased thoracic kyphosis  LUMBARAROM/PROM:  A/PROM A/PROM  eval  Flexion Within normal limits   Extension Within normal limits   Right lateral flexion Within normal limits   Left lateral flexion Within normal limits   Right rotation Within normal limits   Left rotation Within normal limits    (Blank rows = not tested)  LOWER EXTREMITY ROM: within functional limits   LOWER EXTREMITY MMT: 4/5 bilateral knees and hips grossly  PALPATION:   General: no significant tenderness to palpation of bilateral adductors or hip flexors   Pelvic Alignment: within normal limits   Abdominal: upper chest breathing and abdominal bracing at rest, decreased lower rib excursion with inhalation                 External Perineal Exam: dryness noted with mild clitoral hood mobility restriction  present                              Internal Pelvic Floor: Patient demonstrates normal tone throughout the superficial and deep pelvic floor muscles bilaterally. When patient contracts her pelvic floor, muscle tension remains throughout the pelvic floor without the patient doing so on her own. She demonstrates stiffness throughout superficial and deep pelvic floor, most likely due to increased muscle tension at rest. With deep diaphragmatic breathing in hooklying, patient is able to actively lengthen/relax her pelvic floor with inhalation. No pain following today's examination.    Patient confirms identification and approves PT to assess internal pelvic floor and treatment Yes No emotional/communication barriers or cognitive limitation. Patient is motivated to learn. Patient understands and agrees with treatment goals and plan. PT explains patient will be examined in standing, sitting, and lying down to see how their muscles and joints work. When they are ready, they will be asked to remove their underwear so PT can examine their perineum. The patient is also given the option of providing their own chaperone as one is not provided in our facility. The patient also has the right and is explained the right to defer or refuse any part of the evaluation or treatment including the internal exam. With the patient's consent, PT will use one gloved finger to gently assess the muscles of the pelvic floor, seeing how well it contracts and relaxes and if there is muscle symmetry. After, the patient will get dressed and PT and patient will discuss exam findings and plan of care. PT and patient discuss  plan of care, schedule, attendance policy and HEP activities.  PELVIC MMT:   MMT eval Re-eval 10/02/23  Vaginal 4/5, 5 quick flicks, 5 second hold 5/5 with improved ability to relax   Internal Anal Sphincter    External Anal Sphincter    Puborectalis    Diastasis Recti    (Blank rows = not  tested)  TONE: Increased following pelvic floor muscle contraction  PROLAPSE: N/A   TODAY'S TREATMENT:                                                                                                                              DATE:   08/15/23: Neuro re-ed: Hooklying diaphragmatic breathing + pelvic floor lengthening with inhalation and shorteneing with exhalation 2x10  Manual therapy: Scar tissue mobilization using cup over left lower ribs/upper abdominal quadrant to decrease scar tissue restriction  Internal vaginal canal mobilization/stretching to decrease  Self care:  Relative anatomy and the connection between the diaphragm and pelvic floor, silicone based lubricant samples and education, vaginal moisturizer education Dilator size 1-2 education and use at home: water based lubricant, positioning, technique, and frequency   08/22/23: Neuro re-ed: Hooklying diaphragmatic breathing + pelvic floor lengthening with inhalation and shorteneing with exhalation 2x10  Manual therapy: Scar tissue mobilization using cup over left lower ribs/upper abdominal quadrant to decrease scar tissue restriction  Internal vaginal canal mobilization/stretching to decrease  Self care:  Relative anatomy and the connection between the diaphragm and pelvic floor, silicone based lubricant samples and education, vaginal moisturizer education Dilator size 1-2 education and use at home: water based lubricant, positioning, technique, and frequency   09/09/23: Therapeutic exercise: NuStep level 4 - 5 min - PT present to discuss current status  Lower trunk rotations + diaphragmatic breathing  SKTC stretch + diaphragmatic breathing 2x39min Squat + GTB around knees + 15# weight + diaphragmatic breathing 2x10  Lateral banded stepping + diaphragmatic breathing (GTB) 2x10  Neuro re-ed: Hooklying diaphragmatic breathing + pelvic floor lengthening with inhalation and shorteneing with exhalation 2x10  Primal push up +  shoulder taps + diaphragmatic breathing 2x10  Side plank dips + diaphragmatic breathing 2x10  Dead bug arms and legs + diaphragmatic breathing 2x10  Self care:  Relative anatomy and the connection between the diaphragm and pelvic floor, silicone based lubricant samples and education, vaginal moisturizer education Dilator size 1-2 education and use at home: water based lubricant, positioning, technique, and frequency   10/02/23:  Re-evaluation and recheck of pelvic floor via internal vaginal examination  Patient demonstrates increased tone in bilateral aspects of superficial and deep pelvic floor musculature, more so on right side than left due to birth scarring  Internal diaphragmatic breathing + pelvic floor lengthening 2x10  Internal obturator release bilaterally    10/08/23: Internal vaginal superficial and deep pelvic floor musculature release with focus on sustained pressure and sweeping release techniques  Internal vaginal canal mobilization/stretching to decrease sensitivity at introitus  Deep diaphragmatic breathing  to relax pelvic floor musculature in supine 2x3 breaths   PATIENT EDUCATION:  Education details: Relative anatomy and the connection between the diaphragm and pelvic floor, silicone based lubricant samples and education, vaginal moisturizer education Person educated: Patient Education method: Explanation, Demonstration, Tactile cues, Verbal cues, and Handouts Education comprehension: verbalized understanding, returned demonstration, verbal cues required, tactile cues required, and needs further education  HOME EXERCISE PROGRAM: Access Code: [email or web address] URL: [email or web address] Date: 10/02/2023 Prepared by: Celena Domino  Exercises - Yoga Squat for Pelvic Floor Relaxation with Yoga Block  - 1 x daily - 7 x weekly - 3 sets - hold  ASSESSMENT:  CLINICAL IMPRESSION: Patient is a 48 y.o. female  who was seen today for physical therapy treatment for  post-menopausal related care. Since undergoing treatment for breast cancer in 2022 and being medically induced into menopause, patient has been experiencing vaginal dryness, tightness, pain during intercourse, and hemorrhoids.Patient reports no pelvic pain at rest. Internal vaginal treatment performed to stretch superficial and deep pelvic floor musculature. She felt more tension on the left side than right side, most likely due to scar tissue restriction internally. Overall, patient tolerated session well and Pt would benefit from additional PT to further address deficits.    OBJECTIVE IMPAIRMENTS: decreased coordination, decreased endurance, decreased mobility, decreased ROM, decreased strength, and pain.   ACTIVITY LIMITATIONS: vaginal penetration during intercourse/pelvic examinations   PARTICIPATION LIMITATIONS: N/A  PERSONAL FACTORS: Age, Past/current experiences, and Time since onset of injury/illness/exacerbation are also affecting patient's functional outcome.   REHAB POTENTIAL: Good  CLINICAL DECISION MAKING: Stable/uncomplicated  EVALUATION COMPLEXITY: Low   GOALS: Goals reviewed with patient? Yes  SHORT TERM GOALS: Target date: 10/30/2023  Pt will be independent with HEP.  Baseline: Goal status: INITIAL  2.  Pt will be independent with diaphragmatic breathing and down training activities in order to improve pelvic floor relaxation. Baseline:  Goal status: INITIAL  3.  Pt will be ind with using dilators for reducing pelvic floor tension and pain management to decrease pain during vaginal penetration and improve quality oflife.  LONG TERM GOALS: Target date: 03/31/2024  Pt will be independent with advanced HEP.  Baseline:  Goal status: INITIAL  2.  Pt will be ind with using dilator size 3 for reducing pelvic floor tension and pain management to allow patient to return to vaginal penetration without significant pain to improve quality of life and relationship with  partner.  Baseline:  Goal status: INITIAL  3.  Pt will report less than 2/10 pain with vaginal penetration in order to improve intimate relationship with partner and allow patient to participate in pelvic examinations with little to no pain. Baseline:  Goal status: INITIAL  PLAN:  PT FREQUENCY: 1x/week  PT DURATION: 8 weeks  PLANNED INTERVENTIONS: 97110-Therapeutic exercises, 97530- Therapeutic activity, 97112- Neuromuscular re-education, 97535- Self Care, [id number]- Manual therapy, Patient/Family education, Taping, Dry Needling, Joint mobilization, Spinal mobilization, Scar mobilization, Cryotherapy, and Moist heat  PLAN FOR NEXT SESSION: continued internal treatment in hooklying and reassess pelvic floor tension at rest, introduce pelvic floor contractions?, discuss use of dilators at home, introduce progressive downtraining stretches, manual to ribs and perineal body   Celena JAYSON Domino, PT 10/08/2023, 4:17 PM

## 2023-10-09 ENCOUNTER — Inpatient Hospital Stay: Payer: Self-pay | Attending: Hematology and Oncology

## 2023-10-09 VITALS — BP 117/69 | HR 55 | Resp 13

## 2023-10-09 DIAGNOSIS — C50411 Malignant neoplasm of upper-outer quadrant of right female breast: Secondary | ICD-10-CM | POA: Insufficient documentation

## 2023-10-09 DIAGNOSIS — Z5111 Encounter for antineoplastic chemotherapy: Secondary | ICD-10-CM | POA: Diagnosis present

## 2023-10-09 DIAGNOSIS — Z1721 Progesterone receptor positive status: Secondary | ICD-10-CM | POA: Insufficient documentation

## 2023-10-09 DIAGNOSIS — Z170 Estrogen receptor positive status [ER+]: Secondary | ICD-10-CM | POA: Diagnosis not present

## 2023-10-09 DIAGNOSIS — Z1732 Human epidermal growth factor receptor 2 negative status: Secondary | ICD-10-CM | POA: Diagnosis not present

## 2023-10-09 MED ORDER — GOSERELIN ACETATE 3.6 MG ~~LOC~~ IMPL
3.6000 mg | DRUG_IMPLANT | Freq: Once | SUBCUTANEOUS | Status: AC
  Administered 2023-10-09: 3.6 mg via SUBCUTANEOUS
  Filled 2023-10-09: qty 3.6

## 2023-10-11 ENCOUNTER — Encounter: Payer: Self-pay | Admitting: Infectious Disease

## 2023-10-11 LAB — OVA AND PARASITE EXAMINATION
CONCENTRATE RESULT:: NONE SEEN
MICRO NUMBER:: 16975540
SPECIMEN QUALITY:: ADEQUATE
TRICHROME RESULT:: NONE SEEN

## 2023-10-14 ENCOUNTER — Ambulatory Visit: Payer: Self-pay

## 2023-10-15 ENCOUNTER — Ambulatory Visit: Admitting: Physical Therapy

## 2023-10-15 DIAGNOSIS — M6281 Muscle weakness (generalized): Secondary | ICD-10-CM

## 2023-10-15 DIAGNOSIS — R279 Unspecified lack of coordination: Secondary | ICD-10-CM | POA: Diagnosis not present

## 2023-10-15 DIAGNOSIS — R293 Abnormal posture: Secondary | ICD-10-CM

## 2023-10-15 NOTE — Therapy (Signed)
 OUTPATIENT PHYSICAL THERAPY FEMALE PELVIC TREATMENT   Patient Name: Kaitlin [name] MRN: [id number] DOB:12-16-1975, 48 y.o., female Today's Date: 10/15/2023  END OF SESSION:  PT End of Session - 10/15/23 1510     Visit Number 12    Number of Visits 18    Date for Recertification  11/27/23    Authorization Type [employer]    PT Start Time 0223    PT Stop Time 0305    PT Time Calculation (min) 42 min    Activity Tolerance Patient tolerated treatment well    Behavior During Therapy WFL for tasks assessed/performed          Past Medical History:  Diagnosis Date   Allergy    Anal fissure    Anemia    Asthma    Breast cancer (HCC)    twice   Cardiomyopathy (HCC)    Chronic headaches    Delivery normal 11/2008   Family history of colon cancer    Family history of leukemia    Family history of melanoma    Family history of ovarian cancer    Heart murmur    Kidney stone    x of   PONV (postoperative nausea and vomiting)    S/P tonsillectomy    2000   Past Surgical History:  Procedure Laterality Date   BREAST BIOPSY Right 09/12/2022   US  RT BREAST BX W LOC DEV 1ST LESION IMG BX SPEC US  GUIDE 09/12/2022 [facility]   BREAST BIOPSY Left 01/18/2023   US  LT BREAST BX W LOC DEV 1ST LESION IMG BX SPEC US  GUIDE 01/18/2023 [facility]   BREAST CYST EXCISION Right 09/26/2022   Procedure: RIGHT BREAST CANCER RECURRENT EXCISION;  Surgeon: Ebbie Cough, MD;  Location: [facility];  Service: General;  Laterality: Right;  LMA   BREAST RECONSTRUCTION WITH PLACEMENT OF TISSUE EXPANDER AND ALLODERM Bilateral 12/06/2020   Procedure: BREAST RECONSTRUCTION WITH PLACEMENT OF TISSUE EXPANDER AND ALLODERM;  Surgeon: Arelia Filippo, MD;  Location: [facility];  Service: Plastics;  Laterality: Bilateral;   LEEP     MASTECTOMY W/ SENTINEL NODE BIOPSY Right 12/06/2020   Procedure: RIGHT MASTECTOMY WITH RIGHT AXILLARY SENTINEL LYMPH NODE BIOPSY;  Surgeon:  Ebbie Cough, MD;  Location: [facility];  Service: General;  Laterality: Right;   REMOVAL OF BILATERAL TISSUE EXPANDERS WITH PLACEMENT OF BILATERAL BREAST IMPLANTS Bilateral 09/15/2021   Procedure: REMOVAL OF BILATERAL TISSUE EXPANDERS WITH PLACEMENT OF BILATERAL BREAST IMPLANTS;  Surgeon: Arelia Filippo, MD;  Location: [facility];  Service: Plastics;  Laterality: Bilateral;   tonsil removal  01/22/1998   TOTAL MASTECTOMY Left 12/06/2020   Procedure: LEFT TOTAL MASTECTOMY;  Surgeon: Ebbie Cough, MD;  Location: [facility];  Service: General;  Laterality: Left;   Patient Active Problem List   Diagnosis Date Noted   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   Malignant neoplasm of upper-outer quadrant of right breast in female, estrogen receptor positive (HCC) 12/12/2020   S/P bilateral mastectomy 12/06/2020   Genetic testing 11/17/2020   Family history of breast cancer 11/02/2020   Family history of ovarian cancer 11/02/2020   Family history of colon cancer 11/02/2020   Family history of melanoma 11/02/2020   Family history of leukemia 11/02/2020   Bilateral breast cancer (HCC) 11/01/2020   LLQ abdominal pain 04/06/2011    PCP: Claudene Pellet, MD  REFERRING PROVIDER: Odean Potts, MD   REFERRING DIAG: Z78.0 (ICD-10-CM) -  Post-menopausal  THERAPY DIAG:  Unspecified lack of coordination  Abnormal posture  Muscle weakness (generalized)  Rationale for Evaluation and Treatment: Rehabilitation  ONSET DATE: 2010  SUBJECTIVE:                                                                                                                                                                                           SUBJECTIVE STATEMENT: Patient reports that there has been some pelvic heaviness today and yesterday. She noticed this throughout the night as she was getting up to go to the bathroom. She got up 4 times  to pee last night, and she was also very restless. No leakage to report. She has had some burning around the urethra and is not sure why. She has never had a UTI before.    From eval: Patient reports that she had breast cancer in 2022 and a local reoccurrence in Sept 2024, which led to her being placed in medical menopause in Oct of 2024. Since then, she has experienced vaginal dryness and tightness in the pelvic floor, contributing to hemorrhoids and painful intercourse. In 2010 and 2014 she had vaginal deliveries, both with tearing. She has seen a GI specialist and plans to have a colonoscopy soon. She has seen a pelvic PT for 3 visits and from that experience she has dilators (6 different sizes - has not used these yet) and cups. She has had a double mastectomy and has lots of scar tissue under the ribs, which she has used the cups for in the past. She feels like her pelvic floor gets crampy every now and again. No bladder related issues to report. She has a vibration plate and rebounder at home. She sees a chiro 1x/wk consistently.  Fluid intake: drinks a lot of water - tries to get 60-90oz   PAIN:  Are you having pain? No NPRS scale: 0/10  With intercourse 10/02/23: 10/10 burning pain   PRECAUTIONS: None  RED FLAGS: None   WEIGHT BEARING RESTRICTIONS: No  FALLS:  Has patient fallen in last 6 months? No  OCCUPATION: works currently with college students   ACTIVITY LEVEL : works out 5-6x/wk at [employer], walks 1 mile most days  PLOF: Independent with basic ADLs  PATIENT GOALS: decrease pelvic floor tension at rest and hemorrhoid discomfort, decrease scar tissue restriction present from mastectomy surgery and from vaginal tearing during deliveries   PERTINENT HISTORY:  LEEP vaginally 2015, double mastectomy, 3 endometrial polyps all benign in 09/2022   BOWEL MOVEMENT: Pain with bowel movement: No - will only have pain with hemorrhoids when they are present  Type of bowel  movement:Type (Bristol Stool Scale) 3-4, Frequency 1-2x/day, Strain no, and Splinting no Fully empty rectum: No  Leakage: No Pads: No Fiber supplement/laxative Yes   URINATION: Pain with urination: No Fully empty bladder: Yes:   Stream: Strong Urgency: Yes  Frequency: within normal limits  Leakage: none Pads: No  INTERCOURSE: not currently sexually active due to pain - feels like barbwire when she tries to have intercourse    Ability to have vaginal penetration No  Pain with intercourse: Pain Interrupts Intercourse DrynessYes  Marinoff Scale: 3/3  PREGNANCY: Vaginal deliveries 2 Tearing Yes: with both deliveries, first tear was lateral in nature   PROLAPSE: None  OBJECTIVE:  Note: Objective measures were completed at Evaluation unless otherwise noted.  PATIENT SURVEYS:  PFIQ-7: 46  COGNITION: Overall cognitive status: Within functional limits for tasks assessed     SENSATION: Light touch: Appears intact  LUMBAR SPECIAL TESTS:  Single leg stance test: Positive  FUNCTIONAL TESTS:  Squat test: mild dynamic knee valgus with loading, no significant lumbopelvic stiffness present   GAIT: Assistive device utilized: None Comments: mild trendelenburg gait pattern with ambulation   POSTURE: rounded shoulders, forward head, and increased thoracic kyphosis  LUMBARAROM/PROM:  A/PROM A/PROM  eval  Flexion Within normal limits   Extension Within normal limits   Right lateral flexion Within normal limits   Left lateral flexion Within normal limits   Right rotation Within normal limits   Left rotation Within normal limits    (Blank rows = not tested)  LOWER EXTREMITY ROM: within functional limits   LOWER EXTREMITY MMT: 4/5 bilateral knees and hips grossly  PALPATION:   General: no significant tenderness to palpation of bilateral adductors or hip flexors   Pelvic Alignment: within normal limits   Abdominal: upper chest breathing and abdominal bracing at rest,  decreased lower rib excursion with inhalation                 External Perineal Exam: dryness noted with mild clitoral hood mobility restriction present                              Internal Pelvic Floor: Patient demonstrates normal tone throughout the superficial and deep pelvic floor muscles bilaterally. When patient contracts her pelvic floor, muscle tension remains throughout the pelvic floor without the patient doing so on her own. She demonstrates stiffness throughout superficial and deep pelvic floor, most likely due to increased muscle tension at rest. With deep diaphragmatic breathing in hooklying, patient is able to actively lengthen/relax her pelvic floor with inhalation. No pain following today's examination.    Patient confirms identification and approves PT to assess internal pelvic floor and treatment Yes No emotional/communication barriers or cognitive limitation. Patient is motivated to learn. Patient understands and agrees with treatment goals and plan. PT explains patient will be examined in standing, sitting, and lying down to see how their muscles and joints work. When they are ready, they will be asked to remove their underwear so PT can examine their perineum. The patient is also given the option of providing their own chaperone as one is not provided in our facility. The patient also has the right and is explained the right to defer or refuse any part of the evaluation or treatment including the internal exam. With the patient's consent, PT will use one gloved finger to gently assess the muscles of the pelvic floor, seeing how well it contracts and relaxes and if  there is muscle symmetry. After, the patient will get dressed and PT and patient will discuss exam findings and plan of care. PT and patient discuss plan of care, schedule, attendance policy and HEP activities.  PELVIC MMT:   MMT eval Re-eval 10/02/23  Vaginal 4/5, 5 quick flicks, 5 second hold 5/5 with improved ability to  relax   Internal Anal Sphincter    External Anal Sphincter    Puborectalis    Diastasis Recti    (Blank rows = not tested)  TONE: Increased following pelvic floor muscle contraction  PROLAPSE: N/A   TODAY'S TREATMENT:                                                                                                                              DATE:    10/02/23:  Re-evaluation and recheck of pelvic floor via internal vaginal examination  Patient demonstrates increased tone in bilateral aspects of superficial and deep pelvic floor musculature, more so on right side than left due to birth scarring  Internal diaphragmatic breathing + pelvic floor lengthening 2x10  Internal obturator release bilaterally   10/08/23: Internal vaginal superficial and deep pelvic floor musculature release with focus on sustained pressure and sweeping release techniques  Internal vaginal canal mobilization/stretching to decrease sensitivity at introitus  Deep diaphragmatic breathing to relax pelvic floor musculature in supine 2x3 breaths   10/15/23: Pt arrived 23 minutes late to PT session today - thought appt was at 2:30 PM Internal vaginal superficial and deep pelvic floor musculature release with focus on sustained pressure and sweeping release techniques  Internal vaginal canal mobilization/stretching to decrease sensitivity at introitus  Deep diaphragmatic breathing to relax pelvic floor musculature in supine 2x3 breaths  Deep pelvic floor release: bilateral sweeping and sustained pressure techniques  PATIENT EDUCATION:  Education details: Relative anatomy and the connection between the diaphragm and pelvic floor, silicone based lubricant samples and education, vaginal moisturizer education Person educated: Patient Education method: Explanation, Demonstration, Tactile cues, Verbal cues, and Handouts Education comprehension: verbalized understanding, returned demonstration, verbal cues required, tactile  cues required, and needs further education  HOME EXERCISE PROGRAM: Access Code: [email or web address] URL: [email or web address] Date: 10/02/2023 Prepared by: Celena Domino  Exercises - Yoga Squat for Pelvic Floor Relaxation with Yoga Block  - 1 x daily - 7 x weekly - 3 sets - hold  ASSESSMENT:  CLINICAL IMPRESSION: Patient is a 48 y.o. female  who was seen today for physical therapy treatment for post-menopausal related care. Since undergoing treatment for breast cancer in 2022 and being medically induced into menopause, patient has been experiencing vaginal dryness, tightness, pain during intercourse, and hemorrhoids. Patient reports some mild vaginal heaviness today and has been having increased urinary frequency at night. Internal treatment performed today to release deep pelvic floor tension and to stretch the vaginal canal. 0/10 pain at rest after session, some mild burning at the urethra. She plans to see urgent care if her burning  symptoms persist. Overall, patient tolerated session well and Pt would benefit from additional PT to further address deficits.    OBJECTIVE IMPAIRMENTS: decreased coordination, decreased endurance, decreased mobility, decreased ROM, decreased strength, and pain.   ACTIVITY LIMITATIONS: vaginal penetration during intercourse/pelvic examinations   PARTICIPATION LIMITATIONS: N/A  PERSONAL FACTORS: Age, Past/current experiences, and Time since onset of injury/illness/exacerbation are also affecting patient's functional outcome.   REHAB POTENTIAL: Good  CLINICAL DECISION MAKING: Stable/uncomplicated  EVALUATION COMPLEXITY: Low   GOALS: Goals reviewed with patient? Yes  SHORT TERM GOALS: Target date: 10/30/2023  Pt will be independent with HEP.  Baseline: Goal status: INITIAL  2.  Pt will be independent with diaphragmatic breathing and down training activities in order to improve pelvic floor relaxation. Baseline:  Goal status: INITIAL  3.   Pt will be ind with using dilators for reducing pelvic floor tension and pain management to decrease pain during vaginal penetration and improve quality oflife.  LONG TERM GOALS: Target date: 03/31/2024  Pt will be independent with advanced HEP.  Baseline:  Goal status: INITIAL  2.  Pt will be ind with using dilator size 3 for reducing pelvic floor tension and pain management to allow patient to return to vaginal penetration without significant pain to improve quality of life and relationship with partner.  Baseline:  Goal status: INITIAL  3.  Pt will report less than 2/10 pain with vaginal penetration in order to improve intimate relationship with partner and allow patient to participate in pelvic examinations with little to no pain. Baseline:  Goal status: INITIAL  PLAN:  PT FREQUENCY: 1x/week  PT DURATION: 8 weeks  PLANNED INTERVENTIONS: 97110-Therapeutic exercises, 97530- Therapeutic activity, 97112- Neuromuscular re-education, 97535- Self Care, [id number]- Manual therapy, Patient/Family education, Taping, Dry Needling, Joint mobilization, Spinal mobilization, Scar mobilization, Cryotherapy, and Moist heat  PLAN FOR NEXT SESSION: continued internal treatment in hooklying and reassess pelvic floor tension at rest, introduce pelvic floor contractions?, discuss use of dilators at home, introduce progressive downtraining stretches, manual to ribs and perineal body   Celena JAYSON Domino, PT 10/15/2023, 3:11 PM

## 2023-10-22 ENCOUNTER — Ambulatory Visit: Payer: Self-pay | Admitting: Physical Therapy

## 2023-10-22 DIAGNOSIS — R279 Unspecified lack of coordination: Secondary | ICD-10-CM | POA: Diagnosis not present

## 2023-10-22 DIAGNOSIS — R293 Abnormal posture: Secondary | ICD-10-CM

## 2023-10-22 NOTE — Therapy (Signed)
 OUTPATIENT PHYSICAL THERAPY FEMALE PELVIC TREATMENT   Patient Name: Kaitlin [name] MRN: [id number] DOB:11/23/1975, 48 y.o., female Today's Date: 10/22/2023  END OF SESSION:  PT End of Session - 10/22/23 1420     Visit Number 13    Number of Visits 18    Date for Recertification  11/27/23    Authorization Type [employer]    PT Start Time 0200    PT Stop Time 0240    PT Time Calculation (min) 40 min    Activity Tolerance Patient tolerated treatment well    Behavior During Therapy WFL for tasks assessed/performed           Past Medical History:  Diagnosis Date   Allergy    Anal fissure    Anemia    Asthma    Breast cancer (HCC)    twice   Cardiomyopathy (HCC)    Chronic headaches    Delivery normal 11/2008   Family history of colon cancer    Family history of leukemia    Family history of melanoma    Family history of ovarian cancer    Heart murmur    Kidney stone    x of   PONV (postoperative nausea and vomiting)    S/P tonsillectomy    2000   Past Surgical History:  Procedure Laterality Date   BREAST BIOPSY Right 09/12/2022   US  RT BREAST BX W LOC DEV 1ST LESION IMG BX SPEC US  GUIDE 09/12/2022 [facility]   BREAST BIOPSY Left 01/18/2023   US  LT BREAST BX W LOC DEV 1ST LESION IMG BX SPEC US  GUIDE 01/18/2023 [facility]   BREAST CYST EXCISION Right 09/26/2022   Procedure: RIGHT BREAST CANCER RECURRENT EXCISION;  Surgeon: Ebbie Cough, MD;  Location: [facility];  Service: General;  Laterality: Right;  LMA   BREAST RECONSTRUCTION WITH PLACEMENT OF TISSUE EXPANDER AND ALLODERM Bilateral 12/06/2020   Procedure: BREAST RECONSTRUCTION WITH PLACEMENT OF TISSUE EXPANDER AND ALLODERM;  Surgeon: Arelia Filippo, MD;  Location: [facility];  Service: Plastics;  Laterality: Bilateral;   LEEP     MASTECTOMY W/ SENTINEL NODE BIOPSY Right 12/06/2020   Procedure: RIGHT MASTECTOMY WITH RIGHT AXILLARY SENTINEL LYMPH NODE BIOPSY;  Surgeon:  Ebbie Cough, MD;  Location: [facility];  Service: General;  Laterality: Right;   REMOVAL OF BILATERAL TISSUE EXPANDERS WITH PLACEMENT OF BILATERAL BREAST IMPLANTS Bilateral 09/15/2021   Procedure: REMOVAL OF BILATERAL TISSUE EXPANDERS WITH PLACEMENT OF BILATERAL BREAST IMPLANTS;  Surgeon: Arelia Filippo, MD;  Location: [facility];  Service: Plastics;  Laterality: Bilateral;   tonsil removal  01/22/1998   TOTAL MASTECTOMY Left 12/06/2020   Procedure: LEFT TOTAL MASTECTOMY;  Surgeon: Ebbie Cough, MD;  Location: [facility];  Service: General;  Laterality: Left;   Patient Active Problem List   Diagnosis Date Noted   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   Malignant neoplasm of upper-outer quadrant of right breast in female, estrogen receptor positive (HCC) 12/12/2020   S/P bilateral mastectomy 12/06/2020   Genetic testing 11/17/2020   Family history of breast cancer 11/02/2020   Family history of ovarian cancer 11/02/2020   Family history of colon cancer 11/02/2020   Family history of melanoma 11/02/2020   Family history of leukemia 11/02/2020   Bilateral breast cancer (HCC) 11/01/2020   LLQ abdominal pain 04/06/2011    PCP: Claudene Pellet, MD  REFERRING PROVIDER: Odean Potts, MD   REFERRING DIAG: Z78.0 (ICD-10-CM) -  Post-menopausal  THERAPY DIAG:  Unspecified lack of coordination  Abnormal posture  Rationale for Evaluation and Treatment: Rehabilitation  ONSET DATE: 2010  SUBJECTIVE:                                                                                                                                                                                           SUBJECTIVE STATEMENT: 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 She can tell when she is stressed, her body feels it. Felt good after last session, a little sore but not debilitating. Vaginal burning  is still present but not as intense as it was. She has recently been getting up more in the nighttime to void. She tried using her dilator 1x after last visit - 2 days after last session - felt the same as last attempt. Can tell there is a correlation between stress in the body and tightness in the pelvic floor.   From eval: Patient reports that she had breast cancer in 2022 and a local reoccurrence in Sept 2024, which led to her being placed in medical menopause in Oct of 2024. Since then, she has experienced vaginal dryness and tightness in the pelvic floor, contributing to hemorrhoids and painful intercourse. In 2010 and 2014 she had vaginal deliveries, both with tearing. She has seen a GI specialist and plans to have a colonoscopy soon. She has seen a pelvic PT for 3 visits and from that experience she has dilators (6 different sizes - has not used these yet) and cups. She has had a double mastectomy and has lots of scar tissue under the ribs, which she has used the cups for in the past. She feels like her pelvic floor gets crampy every now and again. No bladder related issues to report. She has a vibration plate and rebounder at home. She sees a chiro 1x/wk consistently.  Fluid intake: drinks a lot of water - tries to get 60-90oz   PAIN:  Are you having pain? No NPRS scale: 0/10  With intercourse 10/02/23: 10/10 burning pain   PRECAUTIONS: None  RED FLAGS: None   WEIGHT BEARING RESTRICTIONS: No  FALLS:  Has patient fallen in last 6 months? No  OCCUPATION: works currently with college students   ACTIVITY LEVEL : works out 5-6x/wk at [employer], walks 1 mile most days  PLOF: Independent with basic ADLs  PATIENT GOALS: decrease pelvic floor tension at rest and hemorrhoid discomfort, decrease scar tissue restriction present from mastectomy surgery and from vaginal tearing during deliveries   PERTINENT HISTORY:  LEEP vaginally 2015, double mastectomy, 3 endometrial polyps all  benign  in 09/2022   BOWEL MOVEMENT: Pain with bowel movement: No - will only have pain with hemorrhoids when they are present  Type of bowel movement:Type (Bristol Stool Scale) 3-4, Frequency 1-2x/day, Strain no, and Splinting no Fully empty rectum: No  Leakage: No Pads: No Fiber supplement/laxative Yes   URINATION: Pain with urination: No Fully empty bladder: Yes:   Stream: Strong Urgency: Yes  Frequency: within normal limits  Leakage: none Pads: No  INTERCOURSE: not currently sexually active due to pain - feels like barbwire when she tries to have intercourse    Ability to have vaginal penetration No  Pain with intercourse: Pain Interrupts Intercourse DrynessYes  Marinoff Scale: 3/3  PREGNANCY: Vaginal deliveries 2 Tearing Yes: with both deliveries, first tear was lateral in nature   PROLAPSE: None  OBJECTIVE:  Note: Objective measures were completed at Evaluation unless otherwise noted.  PATIENT SURVEYS:  PFIQ-7: 65  COGNITION: Overall cognitive status: Within functional limits for tasks assessed     SENSATION: Light touch: Appears intact  LUMBAR SPECIAL TESTS:  Single leg stance test: Positive  FUNCTIONAL TESTS:  Squat test: mild dynamic knee valgus with loading, no significant lumbopelvic stiffness present   GAIT: Assistive device utilized: None Comments: mild trendelenburg gait pattern with ambulation   POSTURE: rounded shoulders, forward head, and increased thoracic kyphosis  LUMBARAROM/PROM:  A/PROM A/PROM  eval  Flexion Within normal limits   Extension Within normal limits   Right lateral flexion Within normal limits   Left lateral flexion Within normal limits   Right rotation Within normal limits   Left rotation Within normal limits    (Blank rows = not tested)  LOWER EXTREMITY ROM: within functional limits   LOWER EXTREMITY MMT: 4/5 bilateral knees and hips grossly  PALPATION:   General: no significant tenderness to palpation of bilateral  adductors or hip flexors   Pelvic Alignment: within normal limits   Abdominal: upper chest breathing and abdominal bracing at rest, decreased lower rib excursion with inhalation                 External Perineal Exam: dryness noted with mild clitoral hood mobility restriction present                              Internal Pelvic Floor: Patient demonstrates normal tone throughout the superficial and deep pelvic floor muscles bilaterally. When patient contracts her pelvic floor, muscle tension remains throughout the pelvic floor without the patient doing so on her own. She demonstrates stiffness throughout superficial and deep pelvic floor, most likely due to increased muscle tension at rest. With deep diaphragmatic breathing in hooklying, patient is able to actively lengthen/relax her pelvic floor with inhalation. No pain following today's examination.    Patient confirms identification and approves PT to assess internal pelvic floor and treatment Yes No emotional/communication barriers or cognitive limitation. Patient is motivated to learn. Patient understands and agrees with treatment goals and plan. PT explains patient will be examined in standing, sitting, and lying down to see how their muscles and joints work. When they are ready, they will be asked to remove their underwear so PT can examine their perineum. The patient is also given the option of providing their own chaperone as one is not provided in our facility. The patient also has the right and is explained the right to defer or refuse any part of the evaluation or treatment including the internal exam.  With the patient's consent, PT will use one gloved finger to gently assess the muscles of the pelvic floor, seeing how well it contracts and relaxes and if there is muscle symmetry. After, the patient will get dressed and PT and patient will discuss exam findings and plan of care. PT and patient discuss plan of care, schedule, attendance policy  and HEP activities.  PELVIC MMT:   MMT eval Re-eval 10/02/23  Vaginal 4/5, 5 quick flicks, 5 second hold 5/5 with improved ability to relax   Internal Anal Sphincter    External Anal Sphincter    Puborectalis    Diastasis Recti    (Blank rows = not tested)  TONE: Increased following pelvic floor muscle contraction  PROLAPSE: N/A   TODAY'S TREATMENT:                                                                                                                              DATE:   10/08/23: Internal vaginal superficial and deep pelvic floor musculature release with focus on sustained pressure and sweeping release techniques  Internal vaginal canal mobilization/stretching to decrease sensitivity at introitus  Deep diaphragmatic breathing to relax pelvic floor musculature in supine 2x3 breaths   10/15/23: Pt arrived 23 minutes late to PT session today - thought appt was at 2:30 PM Internal vaginal superficial and deep pelvic floor musculature release with focus on sustained pressure and sweeping release techniques  Internal vaginal canal mobilization/stretching to decrease sensitivity at introitus  Deep diaphragmatic breathing to relax pelvic floor musculature in supine 2x3 breaths  Deep pelvic floor release: bilateral sweeping and sustained pressure techniques  10/22/23: Internal vaginal superficial and deep pelvic floor musculature release with focus on sustained pressure and sweeping release techniques  Internal vaginal canal mobilization/stretching to decrease sensitivity at introitus  Deep diaphragmatic breathing to relax pelvic floor musculature in supine 2x3 breaths  Deep pelvic floor release: bilateral sweeping and sustained pressure techniques Bilateral rib mobilization, scar tissue mobility work, and cupping  to promote blood flow, tissue motility, and increased rib excursion with deep diaphragmatic breathing   PATIENT EDUCATION:  Education details: Relative anatomy and the  connection between the diaphragm and pelvic floor, silicone based lubricant samples and education, vaginal moisturizer education Person educated: Patient Education method: Explanation, Demonstration, Tactile cues, Verbal cues, and Handouts Education comprehension: verbalized understanding, returned demonstration, verbal cues required, tactile cues required, and needs further education  HOME EXERCISE PROGRAM: Access Code: [email or web address] URL: [email or web address] Date: 10/02/2023 Prepared by: Celena Domino  Exercises - Yoga Squat for Pelvic Floor Relaxation with Yoga Block  - 1 x daily - 7 x weekly - 3 sets - hold  ASSESSMENT:  CLINICAL IMPRESSION: Patient is a 48 y.o. female  who was seen today for physical therapy treatment for post-menopausal related care. Urinary frequency at night is going to be monitored by pt in the following week to see if this is stress related or not. Internal treatment performed  today to release deep pelvic floor tension and to stretch the vaginal canal. 0/10 pain at rest after session, some mild burning at the urethra. Bilateral rib mobilization, scar tissue mobility work, and cupping provided today to promote blood flow, tissue motility, and increased rib excursion with deep diaphragmatic breathing. Overall, patient tolerated session well and Pt would benefit from additional PT to further address deficits.    OBJECTIVE IMPAIRMENTS: decreased coordination, decreased endurance, decreased mobility, decreased ROM, decreased strength, and pain.   ACTIVITY LIMITATIONS: vaginal penetration during intercourse/pelvic examinations   PARTICIPATION LIMITATIONS: N/A  PERSONAL FACTORS: Age, Past/current experiences, and Time since onset of injury/illness/exacerbation are also affecting patient's functional outcome.   REHAB POTENTIAL: Good  CLINICAL DECISION MAKING: Stable/uncomplicated  EVALUATION COMPLEXITY: Low   GOALS: Goals reviewed with patient?  Yes  SHORT TERM GOALS: Target date: 10/30/2023  Pt will be independent with HEP.  Baseline: Goal status: INITIAL  2.  Pt will be independent with diaphragmatic breathing and down training activities in order to improve pelvic floor relaxation. Baseline:  Goal status: INITIAL  3.  Pt will be ind with using dilators for reducing pelvic floor tension and pain management to decrease pain during vaginal penetration and improve quality oflife.  LONG TERM GOALS: Target date: 03/31/2024  Pt will be independent with advanced HEP.  Baseline:  Goal status: INITIAL  2.  Pt will be ind with using dilator size 3 for reducing pelvic floor tension and pain management to allow patient to return to vaginal penetration without significant pain to improve quality of life and relationship with partner.  Baseline:  Goal status: INITIAL  3.  Pt will report less than 2/10 pain with vaginal penetration in order to improve intimate relationship with partner and allow patient to participate in pelvic examinations with little to no pain. Baseline:  Goal status: INITIAL  PLAN:  PT FREQUENCY: 1x/week  PT DURATION: 8 weeks  PLANNED INTERVENTIONS: 97110-Therapeutic exercises, 97530- Therapeutic activity, 97112- Neuromuscular re-education, 97535- Self Care, [id number]- Manual therapy, Patient/Family education, Taping, Dry Needling, Joint mobilization, Spinal mobilization, Scar mobilization, Cryotherapy, and Moist heat  PLAN FOR NEXT SESSION: ask about nighttime voiding frequency and see if bladder training is appropriate;   Celena JAYSON Domino, PT 10/22/2023, 2:20 PM

## 2023-10-23 ENCOUNTER — Other Ambulatory Visit (HOSPITAL_COMMUNITY): Payer: Self-pay

## 2023-10-24 ENCOUNTER — Telehealth: Payer: Self-pay

## 2023-10-24 ENCOUNTER — Other Ambulatory Visit: Payer: Self-pay

## 2023-10-24 ENCOUNTER — Other Ambulatory Visit

## 2023-10-24 ENCOUNTER — Other Ambulatory Visit (HOSPITAL_COMMUNITY): Payer: Self-pay

## 2023-10-24 DIAGNOSIS — K529 Noninfective gastroenteritis and colitis, unspecified: Secondary | ICD-10-CM

## 2023-10-24 DIAGNOSIS — B89 Unspecified parasitic disease: Secondary | ICD-10-CM

## 2023-10-24 NOTE — Addendum Note (Signed)
 Addended by: GRETEL TULLY HERO on: 10/24/2023 09:31 AM   Modules accepted: Orders

## 2023-10-24 NOTE — Telephone Encounter (Addendum)
 Oral Oncology Patient Advocate Encounter   Received notification that prior authorization for Verzenio  is due for renewal.   PA submitted on 10/24/23 PA # [id number]  Via [facility] Phone in [place] Dept. P# [phone]  Status is pending      Charlott Hamilton,  CPhT-Adv  she/her/hers [facility]  [facility] Pharmacy Technician Patient Advocate Specialist III [facility] Phone: [phone]  Fax: [phone] [email or web address]

## 2023-10-25 NOTE — Telephone Encounter (Signed)
 Oral Oncology Patient Advocate Encounter  Prior Authorization RENEWAL for Verzenio  has been approved through [facility].  PA# [id number] VIA Phone [facility] P# [phone] Effective dates: 10/24/23 through 10/23/24

## 2023-10-29 ENCOUNTER — Ambulatory Visit: Payer: Self-pay

## 2023-10-29 LAB — OVA AND PARASITE EXAMINATION
CONCENTRATE RESULT:: NONE SEEN
MICRO NUMBER:: 17052576
SPECIMEN QUALITY:: ADEQUATE
TRICHROME RESULT:: NONE SEEN

## 2023-11-04 ENCOUNTER — Encounter: Payer: Self-pay | Admitting: Infectious Disease

## 2023-11-05 ENCOUNTER — Inpatient Hospital Stay: Payer: Self-pay

## 2023-11-05 ENCOUNTER — Inpatient Hospital Stay: Payer: Self-pay | Admitting: Hematology and Oncology

## 2023-11-05 ENCOUNTER — Encounter: Payer: Self-pay | Admitting: Infectious Disease

## 2023-11-05 ENCOUNTER — Inpatient Hospital Stay: Payer: Self-pay | Attending: Hematology and Oncology

## 2023-11-05 VITALS — BP 113/65 | HR 68 | Temp 98.3°F | Resp 17 | Wt 146.4 lb

## 2023-11-05 DIAGNOSIS — K529 Noninfective gastroenteritis and colitis, unspecified: Secondary | ICD-10-CM | POA: Insufficient documentation

## 2023-11-05 DIAGNOSIS — C50411 Malignant neoplasm of upper-outer quadrant of right female breast: Secondary | ICD-10-CM | POA: Insufficient documentation

## 2023-11-05 DIAGNOSIS — R5383 Other fatigue: Secondary | ICD-10-CM | POA: Diagnosis not present

## 2023-11-05 DIAGNOSIS — Z1732 Human epidermal growth factor receptor 2 negative status: Secondary | ICD-10-CM | POA: Insufficient documentation

## 2023-11-05 DIAGNOSIS — C50912 Malignant neoplasm of unspecified site of left female breast: Secondary | ICD-10-CM | POA: Diagnosis not present

## 2023-11-05 DIAGNOSIS — Z5111 Encounter for antineoplastic chemotherapy: Secondary | ICD-10-CM | POA: Diagnosis present

## 2023-11-05 DIAGNOSIS — Z79899 Other long term (current) drug therapy: Secondary | ICD-10-CM | POA: Diagnosis not present

## 2023-11-05 DIAGNOSIS — Z881 Allergy status to other antibiotic agents status: Secondary | ICD-10-CM | POA: Diagnosis not present

## 2023-11-05 DIAGNOSIS — Z9013 Acquired absence of bilateral breasts and nipples: Secondary | ICD-10-CM | POA: Diagnosis not present

## 2023-11-05 DIAGNOSIS — M25552 Pain in left hip: Secondary | ICD-10-CM | POA: Insufficient documentation

## 2023-11-05 DIAGNOSIS — M25551 Pain in right hip: Secondary | ICD-10-CM | POA: Diagnosis not present

## 2023-11-05 DIAGNOSIS — Z170 Estrogen receptor positive status [ER+]: Secondary | ICD-10-CM

## 2023-11-05 DIAGNOSIS — Z90721 Acquired absence of ovaries, unilateral: Secondary | ICD-10-CM | POA: Insufficient documentation

## 2023-11-05 DIAGNOSIS — D72819 Decreased white blood cell count, unspecified: Secondary | ICD-10-CM | POA: Diagnosis not present

## 2023-11-05 DIAGNOSIS — C50911 Malignant neoplasm of unspecified site of right female breast: Secondary | ICD-10-CM

## 2023-11-05 DIAGNOSIS — E162 Hypoglycemia, unspecified: Secondary | ICD-10-CM | POA: Insufficient documentation

## 2023-11-05 DIAGNOSIS — Z880 Allergy status to penicillin: Secondary | ICD-10-CM | POA: Diagnosis not present

## 2023-11-05 DIAGNOSIS — Z79811 Long term (current) use of aromatase inhibitors: Secondary | ICD-10-CM | POA: Diagnosis not present

## 2023-11-05 DIAGNOSIS — Z1721 Progesterone receptor positive status: Secondary | ICD-10-CM | POA: Insufficient documentation

## 2023-11-05 LAB — CBC WITH DIFFERENTIAL (CANCER CENTER ONLY)
Abs Immature Granulocytes: 0 K/uL (ref 0.00–0.07)
Basophils Absolute: 0.1 K/uL (ref 0.0–0.1)
Basophils Relative: 3 %
Eosinophils Absolute: 0.1 K/uL (ref 0.0–0.5)
Eosinophils Relative: 3 %
HCT: 34.9 % — ABNORMAL LOW (ref 36.0–46.0)
Hemoglobin: 12.1 g/dL (ref 12.0–15.0)
Immature Granulocytes: 0 %
Lymphocytes Relative: 25 %
Lymphs Abs: 0.7 K/uL (ref 0.7–4.0)
MCH: 32.9 pg (ref 26.0–34.0)
MCHC: 34.7 g/dL (ref 30.0–36.0)
MCV: 94.8 fL (ref 80.0–100.0)
Monocytes Absolute: 0.4 K/uL (ref 0.1–1.0)
Monocytes Relative: 12 %
Neutro Abs: 1.6 K/uL — ABNORMAL LOW (ref 1.7–7.7)
Neutrophils Relative %: 57 %
Platelet Count: 232 K/uL (ref 150–400)
RBC: 3.68 MIL/uL — ABNORMAL LOW (ref 3.87–5.11)
RDW: 13.2 % (ref 11.5–15.5)
WBC Count: 2.8 K/uL — ABNORMAL LOW (ref 4.0–10.5)
nRBC: 0 % (ref 0.0–0.2)

## 2023-11-05 LAB — CMP (CANCER CENTER ONLY)
ALT: 16 U/L (ref 0–44)
AST: 19 U/L (ref 15–41)
Albumin: 4.5 g/dL (ref 3.5–5.0)
Alkaline Phosphatase: 58 U/L (ref 38–126)
Anion gap: 3 — ABNORMAL LOW (ref 5–15)
BUN: 17 mg/dL (ref 6–20)
CO2: 31 mmol/L (ref 22–32)
Calcium: 9.8 mg/dL (ref 8.9–10.3)
Chloride: 106 mmol/L (ref 98–111)
Creatinine: 0.86 mg/dL (ref 0.44–1.00)
GFR, Estimated: >60 mL/min (ref >60)
Glucose, Bld: 69 mg/dL — ABNORMAL LOW (ref 70–99)
Potassium: 4.4 mmol/L (ref 3.5–5.1)
Sodium: 140 mmol/L (ref 135–145)
Total Bilirubin: 0.5 mg/dL (ref 0.0–1.2)
Total Protein: 7 g/dL (ref 6.5–8.1)

## 2023-11-05 MED ORDER — GOSERELIN ACETATE 3.6 MG ~~LOC~~ IMPL
3.6000 mg | DRUG_IMPLANT | Freq: Once | SUBCUTANEOUS | Status: AC
  Administered 2023-11-05: 3.6 mg via SUBCUTANEOUS
  Filled 2023-11-05: qty 3.6

## 2023-11-05 NOTE — Progress Notes (Unsigned)
 Subjective:  Chief complaint: follow-up for concerns re parasitic infection   Patient ID: Kaitlin [name], female    DOB: 08-02-1975, 48 y.o.   MRN: [id number]  HPI  Past Medical History:  Diagnosis Date   Allergy    Anal fissure    Anemia    Asthma    Breast cancer (HCC)    twice   Cardiomyopathy (HCC)    Chronic headaches    Delivery normal 11/2008   Family history of colon cancer    Family history of leukemia    Family history of melanoma    Family history of ovarian cancer    Heart murmur    Kidney stone    x of   PONV (postoperative nausea and vomiting)    S/P tonsillectomy    2000    Past Surgical History:  Procedure Laterality Date   BREAST BIOPSY Right 09/12/2022   US  RT BREAST BX W LOC DEV 1ST LESION IMG BX SPEC US  GUIDE 09/12/2022 [facility]   BREAST BIOPSY Left 01/18/2023   US  LT BREAST BX W LOC DEV 1ST LESION IMG BX SPEC US  GUIDE 01/18/2023 [facility]   BREAST CYST EXCISION Right 09/26/2022   Procedure: RIGHT BREAST CANCER RECURRENT EXCISION;  Surgeon: Ebbie Cough, MD;  Location: [facility];  Service: General;  Laterality: Right;  LMA   BREAST RECONSTRUCTION WITH PLACEMENT OF TISSUE EXPANDER AND ALLODERM Bilateral 12/06/2020   Procedure: BREAST RECONSTRUCTION WITH PLACEMENT OF TISSUE EXPANDER AND ALLODERM;  Surgeon: Arelia Filippo, MD;  Location: [facility];  Service: Plastics;  Laterality: Bilateral;   LEEP     MASTECTOMY W/ SENTINEL NODE BIOPSY Right 12/06/2020   Procedure: RIGHT MASTECTOMY WITH RIGHT AXILLARY SENTINEL LYMPH NODE BIOPSY;  Surgeon: Ebbie Cough, MD;  Location: [facility];  Service: General;  Laterality: Right;   REMOVAL OF BILATERAL TISSUE EXPANDERS WITH PLACEMENT OF BILATERAL BREAST IMPLANTS Bilateral 09/15/2021   Procedure: REMOVAL OF BILATERAL TISSUE EXPANDERS WITH PLACEMENT OF BILATERAL BREAST IMPLANTS;  Surgeon: Arelia Filippo, MD;  Location: [facility];  Service:  Plastics;  Laterality: Bilateral;   tonsil removal  01/22/1998   TOTAL MASTECTOMY Left 12/06/2020   Procedure: LEFT TOTAL MASTECTOMY;  Surgeon: Ebbie Cough, MD;  Location: [facility];  Service: General;  Laterality: Left;    Family History  Problem Relation Age of Onset   Memory loss Mother    Ulcerative colitis Mother    Colon polyps Father    Bladder Cancer Father    Irritable bowel syndrome Sister    Leukemia Maternal Aunt 50       AML   Melanoma Maternal Aunt 16   Breast cancer Paternal Aunt 16   Colon cancer Paternal Aunt    Leukemia Paternal Aunt 87   Prostate cancer Paternal Uncle 26   Colon polyps Paternal Uncle    Cervical cancer Maternal Grandmother    Parkinson's disease Maternal Grandmother    Alzheimer's disease Maternal Grandmother    Multiple myeloma Maternal Grandfather    Skin cancer Maternal Grandfather    Skin cancer Paternal Grandmother    Colon cancer Paternal Grandfather 67   Stroke Paternal Grandfather    Ovarian cancer Other 53       MGMs sister   Esophageal cancer Neg Hx    Rectal cancer Neg Hx    Stomach cancer Neg Hx       Social History   Socioeconomic History   Marital status: Married  Spouse name: Not on file   Number of children: 2   Years of education: Not on file   Highest education level: Not on file  Occupational History   Occupation: Housewife  Tobacco Use   Smoking status: Former    Types: Cigarettes   Smokeless tobacco: Never  Vaping Use   Vaping status: Never Used  Substance and Sexual Activity   Alcohol use: Not Currently   Drug use: No   Sexual activity: Not Currently    Partners: Male    Birth control/protection: Surgical    Comment: Husband had Vasectomy  Other Topics Concern   Not on file  Social History Narrative   Not on file   Social Drivers of Health   Financial Resource Strain: Not on file  Food Insecurity: Not on file  Transportation Needs: Not on file  Physical Activity:  Not on file  Stress: Not on file  Social Connections: Unknown (10/22/2022)   Received from [facility]   Social Network    Social Network: Not on file    Allergies  Allergen Reactions   Ceclor [Cefaclor] Hives and Shortness Of Breath   Cephalosporins Anaphylaxis and Hives   Rizatriptan Anaphylaxis   Amoxicillin Hives and Rash   Penicillins Other (See Comments)    Unknown reaction      Current Outpatient Medications:    anastrozole  (ARIMIDEX ) 1 MG tablet, Take 1 tablet (1 mg total) by mouth daily., Disp: 60 tablet, Rfl: 3   goserelin (ZOLADEX ) 3.6 MG injection, Inject 3.6 mg into the skin., Disp: , Rfl:    ivermectin (STROMECTOL) 3 MG TABS tablet, Take 12 mg by mouth once. Twice a week (Monday, Thursday), Disp: , Rfl:    lidocaine -prilocaine  (EMLA ) cream, Apply 1 Application topically as needed., Disp: 30 g, Rfl: 0   Magnesium Glycinate 120 MG CAPS, Take 240 mg by mouth at bedtime., Disp: , Rfl:    MISC NATURAL PRODUCTS PO, Take by mouth daily. Super Digestive Enzymes by [employer], Disp: , Rfl:    Multiple Vitamins-Minerals (PREVENT) CAPS, Take by mouth., Disp: , Rfl:    Omega-3 Fatty Acids (FISH OIL) 1000 MG CAPS, Take 1,000 mg by mouth., Disp: , Rfl:    ondansetron  (ZOFRAN ) 4 MG tablet, Take 1 tablet (4 mg total) by mouth every 6 (six) hours as needed for nausea., Disp: 20 tablet, Rfl: 0   OVER THE COUNTER MEDICATION, [place] therapy : twice a week 2.5mg , Disp: , Rfl:    Probiotic, Lactobacillus, CAPS, Take 1 tablet by mouth daily., Disp: , Rfl:    Sodium Sulfate-Mag Sulfate-KCl (SUTAB ) 1479-225-188 MG TABS, Use as directed for colonoscopy. MANUFACTURER CODES!! BIN: [id number] PCN: CN GROUP: [id number] MEMBER ID: [id number];MLW AS SECONDARY INSURANCE ;NO PRIOR AUTHORIZATION, Disp: 24 tablet, Rfl: 0   VERZENIO  50 MG tablet, TAKE 1 TABLET BY MOUTH TWICE  DAILY SWALLOW TABLETS WHOLE (DO  NOT CRUSH OR CHEW), Disp: 56 tablet, Rfl: 5   Vitamin D-Vitamin K (VITAMIN K2-VITAMIN D3 PO),  Take by mouth daily. Vitamin D: 1.25 mcg / Vitamin K: 90 mcg, Disp: , Rfl:    Review of Systems     Objective:   Physical Exam        Assessment & Plan:

## 2023-11-05 NOTE — Progress Notes (Signed)
 Patient Care Team: Claudene Pellet, MD as PCP - General (Family Medicine) Odean Potts, MD as Consulting Physician (Hematology and Oncology) Arelia Filippo, MD as Consulting Physician (Plastic Surgery) Dewey Rush, MD as Consulting Physician (Radiation Oncology) Ebbie Cough, MD as Consulting Physician (General Surgery) Lucila Rush LABOR, RPH-CPP as Pharmacist (Hematology and Oncology)  DIAGNOSIS:  Encounter Diagnosis  Name Primary?   Bilateral malignant neoplasm of breast in female, estrogen receptor positive, unspecified site of breast (HCC) Yes    SUMMARY OF ONCOLOGIC HISTORY: Oncology History  Bilateral breast cancer (HCC)  11/01/2020 Initial Diagnosis   Right breast biopsy UOQ 9:30 position: Grade 2 IDC with DCIS ER 100%, PR [place], Ki-67 2%, HER2 negative Right breast biopsy LIQ: Grade 1 IDC with DCIS Left breast biopsy UOQ: DCIS intermediate grade    11/01/2020 Cancer Staging   Staging form: Breast, AJCC 8th Edition - Clinical: Stage IIA (cT3, cN0, cM0, G2, ER+, PR+, HER2-) - Signed by Odean Potts, MD on 11/01/2020 Stage prefix: Initial diagnosis Histologic grading system: 3 grade system   11/16/2020 Genetic Testing   Negative genetic testing on the CancerNext-Expanded+RNAinsight.  The report date is 11/16/2020  The CancerNext-Expanded gene panel offered by [facility] and includes sequencing and rearrangement analysis for the following 77 genes: AIP, ALK, APC*, ATM*, AXIN2, BAP1, BARD1, BLM, BMPR1A, BRCA1*, BRCA2*, BRIP1*, CDC73, CDH1*, CDK4, CDKN1B, CDKN2A, CHEK2*, CTNNA1, DICER1, FANCC, FH, FLCN, GALNT12, KIF1B, LZTR1, MAX, MEN1, MET, MLH1*, MSH2*, MSH3, MSH6*, MUTYH*, NBN, NF1*, NF2, NTHL1, PALB2*, PHOX2B, PMS2*, POT1, PRKAR1A, PTCH1, PTEN*, RAD51C*, RAD51D*, RB1, RECQL, RET, SDHA, SDHAF2, SDHB, SDHC, SDHD, SMAD4, SMARCA4, SMARCB1, SMARCE1, STK11, SUFU, TMEM127, TP53*, TSC1, TSC2, VHL and XRCC2 (sequencing and deletion/duplication); EGFR, EGLN1, HOXB13,  KIT, MITF, PDGFRA, POLD1, and POLE (sequencing only); EPCAM and GREM1 (deletion/duplication only). DNA and RNA analyses performed for * genes.    12/06/2020 Surgery   Bilateral Mastectomies:  Left Mastectomy:HG DCIS 2.2 cm with necrosis, Margins Neg ER 90%, PR 40% Right Mastectomy: Grade 2 IDC with DCIS 3.8 cm, 1/3 LN ER 100%, PR 100%, Her 2 Neg, Ki 67: 2-15%   12/13/2020 Miscellaneous   MammaPrint: Luminal type A, low risk   01/27/2021 - 03/14/2021 Radiation Therapy   Site Technique Total Dose (Gy) Dose per Fx (Gy) Completed Fx Beam Energies  Chest Wall, Right: CW_R 3D 50.4/50.4 1.8 28/28 6XFFF  Chest Wall, Right: CW_R_SCLV 3D 50.4/50.4 1.8 28/28 6X, 10X  Chest Wall, Right: CW_R_Bst Electron 10/10 2 5/5 6E     03/22/2021 -  Anti-estrogen oral therapy   Adjuvant tamoxifen    Ductal carcinoma in situ (DCIS) of left breast  01/03/2021 Initial Diagnosis   Ductal carcinoma in situ (DCIS) of left breast   06/21/2021 Cancer Staging   Staging form: Breast, AJCC 8th Edition - Clinical: Stage 0 (cTis (DCIS), cN0, cM0, ER+, PR+) - Signed by Crawford Morna Pickle, NP on 06/21/2021     CHIEF COMPLIANT: Follow-up on Verzinio with Zoladex  and anastrozole   HISTORY OF PRESENT ILLNESS:   History of Present Illness Kaitlin [name] is a 48 year old female with breast cancer who presents for follow-up regarding her treatment and symptoms.  She experiences fatigue, joint pain in the hips, and stiffness, which began with her current medication regimen of Verzenio , Zoladex , and anastrozole . She also has headaches and cramping towards the end of her monthly medication cycle, reminiscent of menstrual cycles.  Recent lab work shows a white blood cell count of 2.8 and neutrophils at 1.6. She experienced low blood sugar  despite eating breakfast, which included a banana, oatmeal, and almond butter.  She takes ivermectin at 14 mg twice a week, despite a stool sample indicating parasites, as ivermectin does  not affect flatworms. She continues mistletoe injections.  Her husband is concerned about monitoring capabilities beyond the Gardant test, as previous tests like Signatera did not detect her recurrence. She is not undergoing additional imaging like MRI due to concerns about gadolinium processing.     ALLERGIES:  is allergic to ceclor [cefaclor], cephalosporins, rizatriptan, amoxicillin, and penicillins.  MEDICATIONS:  Current Outpatient Medications  Medication Sig Dispense Refill   anastrozole  (ARIMIDEX ) 1 MG tablet Take 1 tablet (1 mg total) by mouth daily. 60 tablet 3   goserelin (ZOLADEX ) 3.6 MG injection Inject 3.6 mg into the skin.     ivermectin (STROMECTOL) 3 MG TABS tablet Take 12 mg by mouth once. Twice a week (Monday, Thursday)     lidocaine -prilocaine  (EMLA ) cream Apply 1 Application topically as needed. 30 g 0   Magnesium Glycinate 120 MG CAPS Take 240 mg by mouth at bedtime.     MISC NATURAL PRODUCTS PO Take by mouth daily. Super Digestive Enzymes by [employer]     Multiple Vitamins-Minerals (PREVENT) CAPS Take by mouth.     Omega-3 Fatty Acids (FISH OIL) 1000 MG CAPS Take 1,000 mg by mouth.     ondansetron  (ZOFRAN ) 4 MG tablet Take 1 tablet (4 mg total) by mouth every 6 (six) hours as needed for nausea. 20 tablet 0   OVER THE COUNTER MEDICATION [place]toe therapy : twice a week 2.5mg      Probiotic, Lactobacillus, CAPS Take 1 tablet by mouth daily.     Sodium Sulfate-Mag Sulfate-KCl (SUTAB ) 1479-225-188 MG TABS Use as directed for colonoscopy. MANUFACTURER CODES!! BIN: [id number] PCN: CN GROUP: [id number] MEMBER ID: [id number];MLW AS SECONDARY INSURANCE ;NO PRIOR AUTHORIZATION 24 tablet 0   VERZENIO  50 MG tablet TAKE 1 TABLET BY MOUTH TWICE  DAILY SWALLOW TABLETS WHOLE (DO  NOT CRUSH OR CHEW) 56 tablet 5   Vitamin D-Vitamin K (VITAMIN K2-VITAMIN D3 PO) Take by mouth daily. Vitamin D: 1.25 mcg / Vitamin K: 90 mcg     No current facility-administered medications for this visit.     PHYSICAL EXAMINATION: ECOG PERFORMANCE STATUS: 1 - Symptomatic but completely ambulatory  Vitals:   11/05/23 1035  BP: 113/65  Pulse: 68  Resp: 17  Temp: 98.3 F (36.8 C)  SpO2: 100%   Filed Weights   11/05/23 1035  Weight: 146 lb 6.4 oz (66.4 kg)      LABORATORY DATA:  I have reviewed the data as listed    Latest Ref Rng & Units 11/05/2023    9:56 AM 08/14/2023    9:55 AM 06/19/2023   10:24 AM  CMP  Glucose 70 - 99 mg/dL 69  91  96   BUN 6 - 20 mg/dL 17  16  12    Creatinine 0.44 - 1.00 mg/dL 9.13  9.10  8.91   Sodium 135 - 145 mmol/L 140  138  138   Potassium 3.5 - 5.1 mmol/L 4.4  4.2  4.0   Chloride 98 - 111 mmol/L 106  104  102   CO2 22 - 32 mmol/L 31  30  27    Calcium 8.9 - 10.3 mg/dL 9.8  9.6  9.4   Total Protein 6.5 - 8.1 g/dL 7.0  7.3  7.3   Total Bilirubin 0.0 - 1.2 mg/dL 0.5  0.5  0.7   Alkaline  Phos 38 - 126 U/L 58  57  59   AST 15 - 41 U/L 19  26  29    ALT 0 - 44 U/L 16  20  29      Lab Results  Component Value Date   WBC 2.8 (L) 11/05/2023   HGB 12.1 11/05/2023   HCT 34.9 (L) 11/05/2023   MCV 94.8 11/05/2023   PLT 232 11/05/2023   NEUTROABS 1.6 (L) 11/05/2023    ASSESSMENT & PLAN:  Bilateral breast cancer (HCC) 12/06/20: Bilateral Mastectomies:  Left Mastectomy:HG DCIS 2.2 cm with necrosis, Margins Neg ER 90%, PR 40% Right Mastectomy: Grade 2 IDC with DCIS 3.8 cm, 1/3 LN ER 100%, PR 100%, Her 2 Neg, Ki 67: 2-15%   Treatment Plan: 1. Mammaprint: Luminal type a low risk 2. Adjuvant XRT completed 03/14/2021 3. Adjuvant Anti-estrogen therapy with tamoxifen  03/22/2021-08/29/22 4. Recurrence:  Right breast palpable mass: Ultrasound: 6 mm, biopsy: Grade 2 IDC ER 95%, PR 95%, Ki67 30%, HER2 2+ by IHC FISH negative 09/25/2022: PET/CT: Benign --------------------------------------------------------------------------------------------------------------------- Treatment plan: Right lumpectomy: 09/26/2022: Recurrent IDC grade 2 measuring 0.8 cm, margins  negative,ER 95%, PR 95%, Ki67 30%, HER2 2+ by IHC FISH negative Adjuvant ovarian function suppression with anastrozole .  Along with Verzinio started 11/09/2022 tolerating 50 p.o. twice daily very well   Surveillance: guardant reveal June 2025: Negative   Abemaciclib  toxicities:  Intermittent diarrhea Constipation causing hemorrhoidal discomfort Hot flashes and vaginal dryness: Recent issues with cycles: Will check estradiol  levels Muscle aches and pains: Because of change in the manufacture: She will try and obtain the previous manufacturer.   Continue with monthly injections for Zoladex  Return to clinic every 3 months for follow-ups with labs ------------------------------------- Assessment and Plan Assessment & Plan Estrogen receptor positive breast cancer Managed with Abemaciclib , Anastrozole , and Goserelin. Fatigue, joint pain, and stiffness noted. WBC slightly low at 2.8, neutrophils acceptable at 1.6. Ineligible for Hemlock trial due to Verzenio . Discussed potential hydroxychloroquine treatment pending trial results. - Continue Abemaciclib , Anastrozole , and Goserelin. - Monitor WBC every three months. - Schedule next appointment in January.  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 Fluctuations expected unless frequent infections occur. - Monitor WBC every three months. - Advise to report frequent infections or symptoms.  Ovarian suppression On Goserelin. Plans ovary removal surgery after a year. Tamoxifen  avoided due to complications. - Continue Goserelin. - Plan ovary removal surgery after a year. - Avoid Tamoxifen .  Monitoring and imaging Husband concerned about monitoring beyond Gardant. MRI considered if concerns arise. Gadolinium processing a concern if annual. - Consider MRI if future concerns arise. - Reassure husband about current monitoring capabilities.  Hypoglycemia, asymptomatic Low blood sugar levels despite  breakfast. Asymptomatic and not on hypoglycemic medications. - No immediate intervention required.  Parasite management Taking ivermectin. Functional medicine doctor suggested increased dose for flatworms, but ivermectin ineffective for them. Continues mistletoe injections. - Continue current dose of ivermectin. - Continue mistletoe injections. - Send blood results to functional medicine doctor.      No orders of the defined types were placed in this encounter.  The patient has a good understanding of the overall plan. she agrees with it. she will call with any problems that may develop before the next visit here.  I personally spent a total of 30 minutes in the care of the patient today including preparing to see the patient, getting/reviewing separately obtained history, performing a medically appropriate exam/evaluation, counseling and educating, placing orders, referring  and communicating with other health care professionals, documenting clinical information in the EHR, independently interpreting results, communicating results, and coordinating care.   Viinay K Tirsa Gail, MD 11/05/23

## 2023-11-05 NOTE — Assessment & Plan Note (Signed)
 12/06/20: Bilateral Mastectomies:  Left Mastectomy:HG DCIS 2.2 cm with necrosis, Margins Neg ER 90%, PR 40% Right Mastectomy: Grade 2 IDC with DCIS 3.8 cm, 1/3 LN ER 100%, PR 100%, Her 2 Neg, Ki 67: 2-15%   Treatment Plan: 1. Mammaprint: Luminal type a low risk 2. Adjuvant XRT completed 03/14/2021 3. Adjuvant Anti-estrogen therapy with tamoxifen  to start 03/22/2021-08/29/22 4. Recurrence:  Right breast palpable mass: Ultrasound: 6 mm, biopsy: Grade 2 IDC ER 95%, PR 95%, Ki67 30%, HER2 2+ by IHC FISH negative 09/25/2022: PET/CT: Benign --------------------------------------------------------------------------------------------------------------------- Treatment plan: Right lumpectomy: 09/26/2022: Recurrent IDC grade 2 measuring 0.8 cm, margins negative,ER 95%, PR 95%, Ki67 30%, HER2 2+ by IHC FISH negative Adjuvant ovarian function suppression with anastrozole .  Along with Verzinio started 11/09/2022 tolerating 50 p.o. twice daily very well   Surveillance: guardant reveal June 2025: Negative   Abemaciclib  toxicities:  Intermittent diarrhea Constipation causing hemorrhoidal discomfort Hot flashes and vaginal dryness: Recent issues with cycles: Will check estradiol  levels Muscle aches and pains: Because of change in the manufacture: She will try and obtain the previous manufacturer.   Continue with monthly injections for Zoladex  Return to clinic every 3 months for follow-ups with labs

## 2023-11-06 ENCOUNTER — Encounter: Payer: Self-pay | Admitting: Infectious Disease

## 2023-11-06 ENCOUNTER — Inpatient Hospital Stay: Payer: Self-pay

## 2023-11-06 ENCOUNTER — Other Ambulatory Visit: Payer: Self-pay

## 2023-11-06 ENCOUNTER — Inpatient Hospital Stay: Payer: Self-pay | Admitting: Hematology and Oncology

## 2023-11-06 ENCOUNTER — Ambulatory Visit: Payer: Self-pay | Admitting: Infectious Disease

## 2023-11-06 VITALS — BP 115/70 | HR 76 | Temp 98.2°F | Wt 144.0 lb

## 2023-11-06 DIAGNOSIS — C50911 Malignant neoplasm of unspecified site of right female breast: Secondary | ICD-10-CM

## 2023-11-06 DIAGNOSIS — K529 Noninfective gastroenteritis and colitis, unspecified: Secondary | ICD-10-CM | POA: Diagnosis not present

## 2023-11-06 DIAGNOSIS — Z170 Estrogen receptor positive status [ER+]: Secondary | ICD-10-CM | POA: Diagnosis not present

## 2023-11-06 DIAGNOSIS — C50912 Malignant neoplasm of unspecified site of left female breast: Secondary | ICD-10-CM | POA: Diagnosis not present

## 2023-11-12 ENCOUNTER — Ambulatory Visit: Attending: Hematology and Oncology | Admitting: Physical Therapy

## 2023-11-12 DIAGNOSIS — R293 Abnormal posture: Secondary | ICD-10-CM | POA: Insufficient documentation

## 2023-11-12 DIAGNOSIS — M6281 Muscle weakness (generalized): Secondary | ICD-10-CM | POA: Insufficient documentation

## 2023-11-12 DIAGNOSIS — R279 Unspecified lack of coordination: Secondary | ICD-10-CM | POA: Diagnosis present

## 2023-11-12 NOTE — Therapy (Signed)
 OUTPATIENT PHYSICAL THERAPY FEMALE PELVIC TREATMENT   Patient Name: Kaitlin [name] MRN: [id number] DOB:Aug 24, 1975, 48 y.o., female Today's Date: 11/12/2023  END OF SESSION:  PT End of Session - 11/12/23 1531     Visit Number 14    Number of Visits 18    Date for Recertification  11/27/23    Authorization Type [employer]    PT Start Time 0245    PT Stop Time 0330    PT Time Calculation (min) 45 min    Activity Tolerance Patient tolerated treatment well    Behavior During Therapy WFL for tasks assessed/performed            Past Medical History:  Diagnosis Date   Allergy    Anal fissure    Anemia    Asthma    Breast cancer (HCC)    twice   Cardiomyopathy (HCC)    Chronic diarrhea 11/05/2023   Chronic headaches    Delivery normal 11/2008   Family history of colon cancer    Family history of leukemia    Family history of melanoma    Family history of ovarian cancer    Heart murmur    Kidney stone    x of   PONV (postoperative nausea and vomiting)    S/P tonsillectomy    2000   Past Surgical History:  Procedure Laterality Date   BREAST BIOPSY Right 09/12/2022   US  RT BREAST BX W LOC DEV 1ST LESION IMG BX SPEC US  GUIDE 09/12/2022 [facility]   BREAST BIOPSY Left 01/18/2023   US  LT BREAST BX W LOC DEV 1ST LESION IMG BX SPEC US  GUIDE 01/18/2023 [facility]   BREAST CYST EXCISION Right 09/26/2022   Procedure: RIGHT BREAST CANCER RECURRENT EXCISION;  Surgeon: Ebbie Cough, MD;  Location: [facility];  Service: General;  Laterality: Right;  LMA   BREAST RECONSTRUCTION WITH PLACEMENT OF TISSUE EXPANDER AND ALLODERM Bilateral 12/06/2020   Procedure: BREAST RECONSTRUCTION WITH PLACEMENT OF TISSUE EXPANDER AND ALLODERM;  Surgeon: Arelia Filippo, MD;  Location: [facility];  Service: Plastics;  Laterality: Bilateral;   LEEP     MASTECTOMY W/ SENTINEL NODE BIOPSY Right 12/06/2020   Procedure: RIGHT MASTECTOMY WITH RIGHT AXILLARY  SENTINEL LYMPH NODE BIOPSY;  Surgeon: Ebbie Cough, MD;  Location: [facility];  Service: General;  Laterality: Right;   REMOVAL OF BILATERAL TISSUE EXPANDERS WITH PLACEMENT OF BILATERAL BREAST IMPLANTS Bilateral 09/15/2021   Procedure: REMOVAL OF BILATERAL TISSUE EXPANDERS WITH PLACEMENT OF BILATERAL BREAST IMPLANTS;  Surgeon: Arelia Filippo, MD;  Location: [facility];  Service: Plastics;  Laterality: Bilateral;   tonsil removal  01/22/1998   TOTAL MASTECTOMY Left 12/06/2020   Procedure: LEFT TOTAL MASTECTOMY;  Surgeon: Ebbie Cough, MD;  Location:  SURGERY CENTER;  Service: General;  Laterality: Left;   Patient Active Problem List   Diagnosis Date Noted   Chronic diarrhea 11/05/2023   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   Malignant neoplasm of upper-outer quadrant of right breast in female, estrogen receptor positive (HCC) 12/12/2020   S/P bilateral mastectomy 12/06/2020   Genetic testing 11/17/2020   Family history of breast cancer 11/02/2020   Family history of ovarian cancer 11/02/2020   Family history of colon cancer 11/02/2020   Family history of melanoma 11/02/2020   Family history of leukemia 11/02/2020   Bilateral breast cancer (HCC) 11/01/2020   LLQ abdominal pain 04/06/2011    PCP: Claudene Pellet, MD  REFERRING PROVIDER: Odean Potts, MD   REFERRING DIAG: Z78.0 (ICD-10-CM) - Post-menopausal  THERAPY DIAG:  Unspecified lack of coordination  Abnormal posture  Muscle weakness (generalized)  Rationale for Evaluation and Treatment: Rehabilitation  ONSET DATE: 2010  SUBJECTIVE:                                                                                                                                                                                           SUBJECTIVE STATEMENT: She has been having a lot of back pain - she thinks that this is related to stress. She has been seeing a massage  therapist for her back pain - she thinks a lot of this is related to her double mastectomy scar tissue. She is finding a lot of relief from these sessions. No burning or discomfort from last session. Vaginal burning is doing better than last session. She has not had time to do her intercourse or dilator training at home - life is still busy.   From eval: Patient reports that she had breast cancer in 2022 and a local reoccurrence in Sept 2024, which led to her being placed in medical menopause in Oct of 2024. Since then, she has experienced vaginal dryness and tightness in the pelvic floor, contributing to hemorrhoids and painful intercourse. In 2010 and 2014 she had vaginal deliveries, both with tearing. She has seen a GI specialist and plans to have a colonoscopy soon. She has seen a pelvic PT for 3 visits and from that experience she has dilators (6 different sizes - has not used these yet) and cups. She has had a double mastectomy and has lots of scar tissue under the ribs, which she has used the cups for in the past. She feels like her pelvic floor gets crampy every now and again. No bladder related issues to report. She has a vibration plate and rebounder at home. She sees a chiro 1x/wk consistently.  Fluid intake: drinks a lot of water - tries to get 60-90oz   PAIN:  Are you having pain? No NPRS scale: 0/10  With intercourse 10/02/23: 10/10 burning pain   PRECAUTIONS: None  RED FLAGS: None   WEIGHT BEARING RESTRICTIONS: No  FALLS:  Has patient fallen in last 6 months? No  OCCUPATION: works currently with college students   ACTIVITY LEVEL : works out 5-6x/wk at [employer], walks 1 mile most days  PLOF: Independent with basic ADLs  PATIENT GOALS: decrease pelvic floor tension at rest and hemorrhoid discomfort, decrease scar tissue restriction present from mastectomy surgery and from vaginal tearing during deliveries   PERTINENT HISTORY:  LEEP vaginally 2015, double mastectomy,  3  endometrial polyps all benign in 09/2022   BOWEL MOVEMENT: Pain with bowel movement: No - will only have pain with hemorrhoids when they are present  Type of bowel movement:Type (Bristol Stool Scale) 3-4, Frequency 1-2x/day, Strain no, and Splinting no Fully empty rectum: No  Leakage: No Pads: No Fiber supplement/laxative Yes   URINATION: Pain with urination: No Fully empty bladder: Yes:   Stream: Strong Urgency: Yes  Frequency: within normal limits  Leakage: none Pads: No  INTERCOURSE: not currently sexually active due to pain - feels like barbwire when she tries to have intercourse    Ability to have vaginal penetration No  Pain with intercourse: Pain Interrupts Intercourse DrynessYes  Marinoff Scale: 3/3  PREGNANCY: Vaginal deliveries 2 Tearing Yes: with both deliveries, first tear was lateral in nature   PROLAPSE: None  OBJECTIVE:  Note: Objective measures were completed at Evaluation unless otherwise noted.  PATIENT SURVEYS:  PFIQ-7: 55  COGNITION: Overall cognitive status: Within functional limits for tasks assessed     SENSATION: Light touch: Appears intact  LUMBAR SPECIAL TESTS:  Single leg stance test: Positive  FUNCTIONAL TESTS:  Squat test: mild dynamic knee valgus with loading, no significant lumbopelvic stiffness present   GAIT: Assistive device utilized: None Comments: mild trendelenburg gait pattern with ambulation   POSTURE: rounded shoulders, forward head, and increased thoracic kyphosis  LUMBARAROM/PROM:  A/PROM A/PROM  eval  Flexion Within normal limits   Extension Within normal limits   Right lateral flexion Within normal limits   Left lateral flexion Within normal limits   Right rotation Within normal limits   Left rotation Within normal limits    (Blank rows = not tested)  LOWER EXTREMITY ROM: within functional limits   LOWER EXTREMITY MMT: 4/5 bilateral knees and hips grossly  PALPATION:   General: no significant  tenderness to palpation of bilateral adductors or hip flexors   Pelvic Alignment: within normal limits   Abdominal: upper chest breathing and abdominal bracing at rest, decreased lower rib excursion with inhalation                 External Perineal Exam: dryness noted with mild clitoral hood mobility restriction present                              Internal Pelvic Floor: Patient demonstrates normal tone throughout the superficial and deep pelvic floor muscles bilaterally. When patient contracts her pelvic floor, muscle tension remains throughout the pelvic floor without the patient doing so on her own. She demonstrates stiffness throughout superficial and deep pelvic floor, most likely due to increased muscle tension at rest. With deep diaphragmatic breathing in hooklying, patient is able to actively lengthen/relax her pelvic floor with inhalation. No pain following today's examination.    Patient confirms identification and approves PT to assess internal pelvic floor and treatment Yes No emotional/communication barriers or cognitive limitation. Patient is motivated to learn. Patient understands and agrees with treatment goals and plan. PT explains patient will be examined in standing, sitting, and lying down to see how their muscles and joints work. When they are ready, they will be asked to remove their underwear so PT can examine their perineum. The patient is also given the option of providing their own chaperone as one is not provided in our facility. The patient also has the right and is explained the right to defer or refuse any part of the evaluation or treatment  including the internal exam. With the patient's consent, PT will use one gloved finger to gently assess the muscles of the pelvic floor, seeing how well it contracts and relaxes and if there is muscle symmetry. After, the patient will get dressed and PT and patient will discuss exam findings and plan of care. PT and patient discuss plan  of care, schedule, attendance policy and HEP activities.  PELVIC MMT:   MMT eval Re-eval 10/02/23  Vaginal 4/5, 5 quick flicks, 5 second hold 5/5 with improved ability to relax   Internal Anal Sphincter    External Anal Sphincter    Puborectalis    Diastasis Recti    (Blank rows = not tested)  TONE: Increased following pelvic floor muscle contraction  PROLAPSE: N/A   TODAY'S TREATMENT:                                                                                                                              DATE:   10/08/23: Internal vaginal superficial and deep pelvic floor musculature release with focus on sustained pressure and sweeping release techniques  Internal vaginal canal mobilization/stretching to decrease sensitivity at introitus  Deep diaphragmatic breathing to relax pelvic floor musculature in supine 2x3 breaths   10/15/23: Pt arrived 23 minutes late to PT session today - thought appt was at 2:30 PM Internal vaginal superficial and deep pelvic floor musculature release with focus on sustained pressure and sweeping release techniques  Internal vaginal canal mobilization/stretching to decrease sensitivity at introitus  Deep diaphragmatic breathing to relax pelvic floor musculature in supine 2x3 breaths  Deep pelvic floor release: bilateral sweeping and sustained pressure techniques  10/22/23: Internal vaginal superficial and deep pelvic floor musculature release with focus on sustained pressure and sweeping release techniques  Internal vaginal canal mobilization/stretching to decrease sensitivity at introitus  Deep diaphragmatic breathing to relax pelvic floor musculature in supine 2x3 breaths  Deep pelvic floor release: bilateral sweeping and sustained pressure techniques Bilateral rib mobilization, scar tissue mobility work, and cupping  to promote blood flow, tissue motility, and increased rib excursion with deep diaphragmatic breathing   11/12/23: Internal vaginal  superficial and deep pelvic floor musculature release with focus on sustained pressure and sweeping release techniques  Internal vaginal canal mobilization/stretching to decrease sensitivity at introitus  Deep diaphragmatic breathing to relax pelvic floor musculature in supine 2x3 breaths  Deep pelvic floor release: bilateral sweeping and sustained pressure techniques educated on the Oh Nut penetration device to help limit the depth of penetration with her partner  PATIENT EDUCATION:  Education details: Relative anatomy and the connection between the diaphragm and pelvic floor, silicone based lubricant samples and education, vaginal moisturizer education Person educated: Patient Education method: Explanation, Demonstration, Tactile cues, Verbal cues, and Handouts Education comprehension: verbalized understanding, returned demonstration, verbal cues required, tactile cues required, and needs further education  HOME EXERCISE PROGRAM: Access Code: [email or web address] URL: [email or web address] Date: 10/02/2023 Prepared by: Celena Domino  Exercises -  Yoga Squat for Pelvic Floor Relaxation with Yoga Block  - 1 x daily - 7 x weekly - 3 sets - hold  ASSESSMENT:  CLINICAL IMPRESSION: Patient is a 48 y.o. female  who was seen today for physical therapy treatment for post-menopausal related care. Urinary frequency has been decreasing at night, but she plans to keep an eye on this. Internal treatment performed today to release deep pelvic floor tension and to stretch the vaginal canal. 0/10 pain at rest after session, no burning present this time. She had no pain with initial penetration today, which is progressive for her. She was educated on the Oh Nut penetration device to help limit the depth of penetration with her partner if this is what she wishes to try. Overall, patient tolerated session well and Pt would benefit from additional PT to further address deficits.    OBJECTIVE IMPAIRMENTS:  decreased coordination, decreased endurance, decreased mobility, decreased ROM, decreased strength, and pain.   ACTIVITY LIMITATIONS: vaginal penetration during intercourse/pelvic examinations   PARTICIPATION LIMITATIONS: N/A  PERSONAL FACTORS: Age, Past/current experiences, and Time since onset of injury/illness/exacerbation are also affecting patient's functional outcome.   REHAB POTENTIAL: Good  CLINICAL DECISION MAKING: Stable/uncomplicated  EVALUATION COMPLEXITY: Low   GOALS: Goals reviewed with patient? Yes  SHORT TERM GOALS: Target date: 10/30/2023  Pt will be independent with HEP.  Baseline: Goal status: INITIAL  2.  Pt will be independent with diaphragmatic breathing and down training activities in order to improve pelvic floor relaxation. Baseline:  Goal status: INITIAL  3.  Pt will be ind with using dilators for reducing pelvic floor tension and pain management to decrease pain during vaginal penetration and improve quality oflife.  LONG TERM GOALS: Target date: 03/31/2024  Pt will be independent with advanced HEP.  Baseline:  Goal status: INITIAL  2.  Pt will be ind with using dilator size 3 for reducing pelvic floor tension and pain management to allow patient to return to vaginal penetration without significant pain to improve quality of life and relationship with partner.  Baseline:  Goal status: INITIAL  3.  Pt will report less than 2/10 pain with vaginal penetration in order to improve intimate relationship with partner and allow patient to participate in pelvic examinations with little to no pain. Baseline:  Goal status: INITIAL  PLAN:  PT FREQUENCY: 1x/week  PT DURATION: 8 weeks  PLANNED INTERVENTIONS: 97110-Therapeutic exercises, 97530- Therapeutic activity, 97112- Neuromuscular re-education, 97535- Self Care, [id number]- Manual therapy, Patient/Family education, Taping, Dry Needling, Joint mobilization, Spinal mobilization, Scar mobilization,  Cryotherapy, and Moist heat  PLAN FOR NEXT SESSION: ask about nighttime voiding frequency and see if bladder training is appropriate;   Celena JAYSON Domino, PT 11/12/2023, 3:32 PM

## 2023-11-26 ENCOUNTER — Ambulatory Visit: Attending: Hematology and Oncology | Admitting: Physical Therapy

## 2023-11-26 ENCOUNTER — Ambulatory Visit: Attending: Adult Health

## 2023-11-26 VITALS — Wt 146.4 lb

## 2023-11-26 DIAGNOSIS — Z483 Aftercare following surgery for neoplasm: Secondary | ICD-10-CM | POA: Insufficient documentation

## 2023-11-26 DIAGNOSIS — R279 Unspecified lack of coordination: Secondary | ICD-10-CM | POA: Insufficient documentation

## 2023-11-26 DIAGNOSIS — R293 Abnormal posture: Secondary | ICD-10-CM | POA: Diagnosis present

## 2023-11-26 DIAGNOSIS — M6281 Muscle weakness (generalized): Secondary | ICD-10-CM | POA: Insufficient documentation

## 2023-11-26 NOTE — Patient Instructions (Signed)
 Vibration plate routine: try to belly breathe with all of these to further relax your pelvic floor  1 minute standing with bent knees  1 minute seated with externally rotated hips  1 minute seated with internally rotated hips  1 minute inner thigh stretch each side  1 minute hamstring stretch each side   Rotator cuff: 2 sets of 10 reps each (exhale as you pull)  Bent arm rows  Straight arm rows  Ts with band

## 2023-11-26 NOTE — Therapy (Signed)
 OUTPATIENT PHYSICAL THERAPY SOZO SCREENING NOTE   Patient Name: Kaitlin [name] MRN: [id number] DOB:[birth year], 48 y.o., female Today's Date: 11/26/2023  PCP: Claudene Pellet, MD REFERRING PROVIDER: Odean Potts, MD   PT End of Session - 11/26/23 1108     Visit Number 15   # unchanged due to screen only   PT Start Time 1105    PT Stop Time 1109    PT Time Calculation (min) 4 min    Activity Tolerance Patient tolerated treatment well    Behavior During Therapy WFL for tasks assessed/performed          Past Medical History:  Diagnosis Date   Allergy    Anal fissure    Anemia    Asthma    Breast cancer (HCC)    twice   Cardiomyopathy (HCC)    Chronic diarrhea 11/05/2023   Chronic headaches    Delivery normal 11/2008   Family history of colon cancer    Family history of leukemia    Family history of melanoma    Family history of ovarian cancer    Heart murmur    Kidney stone    x of   PONV (postoperative nausea and vomiting)    S/P tonsillectomy    2000   Past Surgical History:  Procedure Laterality Date   BREAST BIOPSY Right 09/12/2022   US  RT BREAST BX W LOC DEV 1ST LESION IMG BX SPEC US  GUIDE 09/12/2022 [facility]   BREAST BIOPSY Left 01/18/2023   US  LT BREAST BX W LOC DEV 1ST LESION IMG BX SPEC US  GUIDE 01/18/2023 [facility]   BREAST CYST EXCISION Right 09/26/2022   Procedure: RIGHT BREAST CANCER RECURRENT EXCISION;  Surgeon: Ebbie Cough, MD;  Location: [facility];  Service: General;  Laterality: Right;  LMA   BREAST RECONSTRUCTION WITH PLACEMENT OF TISSUE EXPANDER AND ALLODERM Bilateral 12/06/2020   Procedure: BREAST RECONSTRUCTION WITH PLACEMENT OF TISSUE EXPANDER AND ALLODERM;  Surgeon: Arelia Filippo, MD;  Location: [facility];  Service: Plastics;  Laterality: Bilateral;   LEEP     MASTECTOMY W/ SENTINEL NODE BIOPSY Right 12/06/2020   Procedure: RIGHT MASTECTOMY WITH RIGHT AXILLARY SENTINEL LYMPH NODE BIOPSY;  Surgeon:  Ebbie Cough, MD;  Location: [facility];  Service: General;  Laterality: Right;   REMOVAL OF BILATERAL TISSUE EXPANDERS WITH PLACEMENT OF BILATERAL BREAST IMPLANTS Bilateral 09/15/2021   Procedure: REMOVAL OF BILATERAL TISSUE EXPANDERS WITH PLACEMENT OF BILATERAL BREAST IMPLANTS;  Surgeon: Arelia Filippo, MD;  Location: [facility];  Service: Plastics;  Laterality: Bilateral;   tonsil removal  01/22/1998   TOTAL MASTECTOMY Left 12/06/2020   Procedure: LEFT TOTAL MASTECTOMY;  Surgeon: Ebbie Cough, MD;  Location: [facility];  Service: General;  Laterality: Left;   Patient Active Problem List   Diagnosis Date Noted   Chronic diarrhea 11/05/2023   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   Malignant neoplasm of upper-outer quadrant of right breast in female, estrogen receptor positive (HCC) 12/12/2020   S/P bilateral mastectomy 12/06/2020   Genetic testing 11/17/2020   Family history of breast cancer 11/02/2020   Family history of ovarian cancer 11/02/2020   Family history of colon cancer 11/02/2020   Family history of melanoma 11/02/2020   Family history of leukemia 11/02/2020   Bilateral breast cancer (HCC) 11/01/2020   LLQ abdominal pain 04/06/2011    REFERRING DIAG: right breast cancer at risk for lymphedema  THERAPY DIAG: Aftercare following surgery  for neoplasm  PERTINENT HISTORY: Bil mastectomy with immediate reconstruction 12/06/20 with 1/3 LN positive.  Rt Grade 2 IDC with DCIS ER positive.  Lt breast DCIS intermediate, no LN. Radiation 01/27/21-03/14/2021 to Right chest.  Implants 09/15/21 ; recurrence to same are 09/2022 with re-excision of breast tissue, no further lymph nodes removed  PRECAUTIONS: right UE Lymphedema risk, None  SUBJECTIVE: Pt returns for her first 6 month L-Dex screen.   PAIN:  Are you having pain? No  SOZO SCREENING: Patient was assessed today using the SOZO machine to determine the  lymphedema index score. This was compared to her baseline score. It was determined that she is within the recommended range when compared to her baseline and no further action is needed at this time. She will continue SOZO screenings. These are done every 3 months for 2 years post operatively followed by every 6 months for 2 years, and then annually.      L-DEX FLOWSHEETS - 11/26/23 1100       L-DEX LYMPHEDEMA SCREENING   Measurement Type Unilateral    L-DEX MEASUREMENT EXTREMITY Upper Extremity    POSITION  Standing    DOMINANT SIDE Right    At Risk Side Right    BASELINE SCORE (UNILATERAL) -0.2    L-DEX SCORE (UNILATERAL) 1.6    VALUE CHANGE (UNILAT) 1.8          P: Cont 6 month L-Dex screens until 11/2024 then can transition to annual.    Aden Berwyn Caldron, PTA 11/26/2023, 11:11 AM

## 2023-11-26 NOTE — Therapy (Signed)
 OUTPATIENT PHYSICAL THERAPY FEMALE PELVIC TREATMENT   Patient Name: Kaitlin [name] MRN: [id number] DOB:[birth year], 48 y.o., female Today's Date: 11/26/2023  END OF SESSION:  PT End of Session - 11/26/23 1046     Visit Number 15    Number of Visits 18    Date for Recertification  11/27/23    Authorization Type [employer]    PT Start Time 1015    PT Stop Time 1100    PT Time Calculation (min) 45 min    Activity Tolerance Patient tolerated treatment well    Behavior During Therapy WFL for tasks assessed/performed             Past Medical History:  Diagnosis Date   Allergy    Anal fissure    Anemia    Asthma    Breast cancer (HCC)    twice   Cardiomyopathy (HCC)    Chronic diarrhea 11/05/2023   Chronic headaches    Delivery normal 11/2008   Family history of colon cancer    Family history of leukemia    Family history of melanoma    Family history of ovarian cancer    Heart murmur    Kidney stone    x of   PONV (postoperative nausea and vomiting)    S/P tonsillectomy    2000   Past Surgical History:  Procedure Laterality Date   BREAST BIOPSY Right 09/12/2022   US  RT BREAST BX W LOC DEV 1ST LESION IMG BX SPEC US  GUIDE 09/12/2022 [facility]   BREAST BIOPSY Left 01/18/2023   US  LT BREAST BX W LOC DEV 1ST LESION IMG BX SPEC US  GUIDE 01/18/2023 [facility]   BREAST CYST EXCISION Right 09/26/2022   Procedure: RIGHT BREAST CANCER RECURRENT EXCISION;  Surgeon: Ebbie Cough, MD;  Location: [facility];  Service: General;  Laterality: Right;  LMA   BREAST RECONSTRUCTION WITH PLACEMENT OF TISSUE EXPANDER AND ALLODERM Bilateral 12/06/2020   Procedure: BREAST RECONSTRUCTION WITH PLACEMENT OF TISSUE EXPANDER AND ALLODERM;  Surgeon: Arelia Filippo, MD;  Location: [facility];  Service: Plastics;  Laterality: Bilateral;   LEEP     MASTECTOMY W/ SENTINEL NODE BIOPSY Right 12/06/2020   Procedure: RIGHT MASTECTOMY WITH RIGHT AXILLARY  SENTINEL LYMPH NODE BIOPSY;  Surgeon: Ebbie Cough, MD;  Location: [facility];  Service: General;  Laterality: Right;   REMOVAL OF BILATERAL TISSUE EXPANDERS WITH PLACEMENT OF BILATERAL BREAST IMPLANTS Bilateral 09/15/2021   Procedure: REMOVAL OF BILATERAL TISSUE EXPANDERS WITH PLACEMENT OF BILATERAL BREAST IMPLANTS;  Surgeon: Arelia Filippo, MD;  Location: [facility];  Service: Plastics;  Laterality: Bilateral;   tonsil removal  01/22/1998   TOTAL MASTECTOMY Left 12/06/2020   Procedure: LEFT TOTAL MASTECTOMY;  Surgeon: Ebbie Cough, MD;  Location: [facility];  Service: General;  Laterality: Left;   Patient Active Problem List   Diagnosis Date Noted   Chronic diarrhea 11/05/2023   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   Malignant neoplasm of upper-outer quadrant of right breast in female, estrogen receptor positive (HCC) 12/12/2020   S/P bilateral mastectomy 12/06/2020   Genetic testing 11/17/2020   Family history of breast cancer 11/02/2020   Family history of ovarian cancer 11/02/2020   Family history of colon cancer 11/02/2020   Family history of melanoma 11/02/2020   Family history of leukemia 11/02/2020   Bilateral breast cancer (HCC) 11/01/2020   LLQ abdominal pain 04/06/2011    PCP: Claudene Pellet, MD  REFERRING PROVIDER: Odean Potts, MD   REFERRING DIAG: Z78.0 (ICD-10-CM) - Post-menopausal  THERAPY DIAG:  Unspecified lack of coordination  Abnormal posture  Muscle weakness (generalized)  Rationale for Evaluation and Treatment: Rehabilitation  ONSET DATE: 2010  SUBJECTIVE:                                                                                                                                                                                           SUBJECTIVE STATEMENT: Back pain has been a little better - massages have been helpful and her SIJ joints have been sore. She had some  tenderness for a couple of days after internal session (with squatting and moving around in general) - this lasted for 3 days. She has not had time to do her intercourse or dilator training at home - life is still busy. She has been getting up at night at least 2x to pee and the urgency is not debilitating.   From eval: Patient reports that she had breast cancer in 2022 and a local reoccurrence in Sept 2024, which led to her being placed in medical menopause in Oct of 2024. Since then, she has experienced vaginal dryness and tightness in the pelvic floor, contributing to hemorrhoids and painful intercourse. In 2010 and 2014 she had vaginal deliveries, both with tearing. She has seen a GI specialist and plans to have a colonoscopy soon. She has seen a pelvic PT for 3 visits and from that experience she has dilators (6 different sizes - has not used these yet) and cups. She has had a double mastectomy and has lots of scar tissue under the ribs, which she has used the cups for in the past. She feels like her pelvic floor gets crampy every now and again. No bladder related issues to report. She has a vibration plate and rebounder at home. She sees a chiro 1x/wk consistently.  Fluid intake: drinks a lot of water - tries to get 60-90oz   PAIN:  Are you having pain? No NPRS scale: 0/10  With intercourse 10/02/23: 10/10 burning pain   PRECAUTIONS: None  RED FLAGS: None   WEIGHT BEARING RESTRICTIONS: No  FALLS:  Has patient fallen in last 6 months? No  OCCUPATION: works currently with college students   ACTIVITY LEVEL : works out 5-6x/wk at [employer], walks 1 mile most days  PLOF: Independent with basic ADLs  PATIENT GOALS: decrease pelvic floor tension at rest and hemorrhoid discomfort, decrease scar tissue restriction present from mastectomy surgery and from vaginal tearing during deliveries   PERTINENT HISTORY:  LEEP vaginally 2015, double mastectomy, 3 endometrial polyps all benign in  09/2022   BOWEL MOVEMENT: Pain with bowel movement: No - will only have pain with hemorrhoids when they are present  Type of bowel movement:Type (Bristol Stool Scale) 3-4, Frequency 1-2x/day, Strain no, and Splinting no Fully empty rectum: No  Leakage: No Pads: No Fiber supplement/laxative Yes   URINATION: Pain with urination: No Fully empty bladder: Yes:   Stream: Strong Urgency: Yes  Frequency: within normal limits  Leakage: none Pads: No  INTERCOURSE: not currently sexually active due to pain - feels like barbwire when she tries to have intercourse    Ability to have vaginal penetration No  Pain with intercourse: Pain Interrupts Intercourse DrynessYes  Marinoff Scale: 3/3  PREGNANCY: Vaginal deliveries 2 Tearing Yes: with both deliveries, first tear was lateral in nature   PROLAPSE: None  OBJECTIVE:  Note: Objective measures were completed at Evaluation unless otherwise noted.  PATIENT SURVEYS:  PFIQ-7: 36  COGNITION: Overall cognitive status: Within functional limits for tasks assessed     SENSATION: Light touch: Appears intact  LUMBAR SPECIAL TESTS:  Single leg stance test: Positive  FUNCTIONAL TESTS:  Squat test: mild dynamic knee valgus with loading, no significant lumbopelvic stiffness present   GAIT: Assistive device utilized: None Comments: mild trendelenburg gait pattern with ambulation   POSTURE: rounded shoulders, forward head, and increased thoracic kyphosis  LUMBARAROM/PROM:  A/PROM A/PROM  eval  Flexion Within normal limits   Extension Within normal limits   Right lateral flexion Within normal limits   Left lateral flexion Within normal limits   Right rotation Within normal limits   Left rotation Within normal limits    (Blank rows = not tested)  LOWER EXTREMITY ROM: within functional limits   LOWER EXTREMITY MMT: 4/5 bilateral knees and hips grossly  PALPATION:   General: no significant tenderness to palpation of bilateral  adductors or hip flexors   Pelvic Alignment: within normal limits   Abdominal: upper chest breathing and abdominal bracing at rest, decreased lower rib excursion with inhalation                 External Perineal Exam: dryness noted with mild clitoral hood mobility restriction present                              Internal Pelvic Floor: Patient demonstrates normal tone throughout the superficial and deep pelvic floor muscles bilaterally. When patient contracts her pelvic floor, muscle tension remains throughout the pelvic floor without the patient doing so on her own. She demonstrates stiffness throughout superficial and deep pelvic floor, most likely due to increased muscle tension at rest. With deep diaphragmatic breathing in hooklying, patient is able to actively lengthen/relax her pelvic floor with inhalation. No pain following today's examination.    Patient confirms identification and approves PT to assess internal pelvic floor and treatment Yes No emotional/communication barriers or cognitive limitation. Patient is motivated to learn. Patient understands and agrees with treatment goals and plan. PT explains patient will be examined in standing, sitting, and lying down to see how their muscles and joints work. When they are ready, they will be asked to remove their underwear so PT can examine their perineum. The patient is also given the option of providing their own chaperone as one is not provided in our facility. The patient also has the right and is explained the right to defer or refuse any part of the evaluation or treatment including the internal exam. With the patient's  consent, PT will use one gloved finger to gently assess the muscles of the pelvic floor, seeing how well it contracts and relaxes and if there is muscle symmetry. After, the patient will get dressed and PT and patient will discuss exam findings and plan of care. PT and patient discuss plan of care, schedule, attendance policy  and HEP activities.  PELVIC MMT:   MMT eval Re-eval 10/02/23  Vaginal 4/5, 5 quick flicks, 5 second hold 5/5 with improved ability to relax   Internal Anal Sphincter    External Anal Sphincter    Puborectalis    Diastasis Recti    (Blank rows = not tested)  TONE: Increased following pelvic floor muscle contraction  PROLAPSE: N/A   TODAY'S TREATMENT:                                                                                                                              DATE:   10/22/23: Internal vaginal superficial and deep pelvic floor musculature release with focus on sustained pressure and sweeping release techniques  Internal vaginal canal mobilization/stretching to decrease sensitivity at introitus  Deep diaphragmatic breathing to relax pelvic floor musculature in supine 2x3 breaths  Deep pelvic floor release: bilateral sweeping and sustained pressure techniques Bilateral rib mobilization, scar tissue mobility work, and cupping  to promote blood flow, tissue motility, and increased rib excursion with deep diaphragmatic breathing   11/12/23: Internal vaginal superficial and deep pelvic floor musculature release with focus on sustained pressure and sweeping release techniques  Internal vaginal canal mobilization/stretching to decrease sensitivity at introitus  Deep diaphragmatic breathing to relax pelvic floor musculature in supine 2x3 breaths  Deep pelvic floor release: bilateral sweeping and sustained pressure techniques educated on the [employer] penetration device to help limit the depth of penetration with her partner  11/26/23: Vibration plate for pelvic floor relaxation:  Standing with soft bend in knees + diaphragmatic breathing  Seated with internally rotated hips + diaphragmatic breathing 2x82min  Seated with externally rotated hips + diaphragmatic breathing 2x72min  Standing adductor stretch + diaphragmatic breathing 2x71min each side  Standing hamstring stretch +  diaphragmatic breathing 2x72min each side Internal vaginal superficial and deep pelvic floor musculature release with focus on sustained pressure and sweeping release techniques  Internal vaginal canal mobilization/stretching to decrease sensitivity at introitus  Deep diaphragmatic breathing to relax pelvic floor musculature in supine 2x3 breaths  Deep pelvic floor release: bilateral sweeping and sustained pressure techniques  PATIENT EDUCATION:  Education details: Relative anatomy and the connection between the diaphragm and pelvic floor, silicone based lubricant samples and education, vaginal moisturizer education Person educated: Patient Education method: Explanation, Demonstration, Tactile cues, Verbal cues, and Handouts Education comprehension: verbalized understanding, returned demonstration, verbal cues required, tactile cues required, and needs further education  HOME EXERCISE PROGRAM: Access Code: [email or web address] URL: [email or web address] Date: 10/02/2023 Prepared by: Celena Domino  Exercises - Yoga Squat for Pelvic Floor Relaxation with Yoga Block  -  1 x daily - 7 x weekly - 3 sets - hold  ASSESSMENT:  CLINICAL IMPRESSION: Patient is a 48 y.o. female  who was seen today for physical therapy treatment for post-menopausal related care. No pain to report today but pt had some tenderness internally after last session. Vibration plate training introduced today and pt was provided with a pelvic floor downtraining routine to try on her vibration plate at home. Internal treatment performed today to release deep pelvic floor tension and to stretch the vaginal canal. 0/10 pain at rest after session, no burning present this time. She had no pain with initial penetration today, which is progressive for her. Deep pain has also been managed with internal treatment and pt is tolerating more internal stretching with each passing week. Overall, patient tolerated session well and Pt would  benefit from additional PT to further address deficits.    OBJECTIVE IMPAIRMENTS: decreased coordination, decreased endurance, decreased mobility, decreased ROM, decreased strength, and pain.   ACTIVITY LIMITATIONS: vaginal penetration during intercourse/pelvic examinations   PARTICIPATION LIMITATIONS: N/A  PERSONAL FACTORS: Age, Past/current experiences, and Time since onset of injury/illness/exacerbation are also affecting patient's functional outcome.   REHAB POTENTIAL: Good  CLINICAL DECISION MAKING: Stable/uncomplicated  EVALUATION COMPLEXITY: Low   GOALS: Goals reviewed with patient? Yes  SHORT TERM GOALS: Target date: 10/30/2023  Pt will be independent with HEP.  Baseline: Goal status: INITIAL  2.  Pt will be independent with diaphragmatic breathing and down training activities in order to improve pelvic floor relaxation. Baseline:  Goal status: INITIAL  3.  Pt will be ind with using dilators for reducing pelvic floor tension and pain management to decrease pain during vaginal penetration and improve quality oflife.  LONG TERM GOALS: Target date: 03/31/2024  Pt will be independent with advanced HEP.  Baseline:  Goal status: INITIAL  2.  Pt will be ind with using dilator size 3 for reducing pelvic floor tension and pain management to allow patient to return to vaginal penetration without significant pain to improve quality of life and relationship with partner.  Baseline:  Goal status: INITIAL  3.  Pt will report less than 2/10 pain with vaginal penetration in order to improve intimate relationship with partner and allow patient to participate in pelvic examinations with little to no pain. Baseline:  Goal status: INITIAL  PLAN:  PT FREQUENCY: 1x/week  PT DURATION: 8 weeks  PLANNED INTERVENTIONS: 97110-Therapeutic exercises, 97530- Therapeutic activity, 97112- Neuromuscular re-education, 97535- Self Care, [id number]- Manual therapy, Patient/Family education, Taping,  Dry Needling, Joint mobilization, Spinal mobilization, Scar mobilization, Cryotherapy, and Moist heat  PLAN FOR NEXT SESSION: ask about nighttime voiding frequency and see if bladder training is appropriate;   Celena JAYSON Domino, PT 11/26/2023, 10:46 AM

## 2023-11-30 ENCOUNTER — Encounter: Payer: Self-pay | Admitting: Hematology and Oncology

## 2023-12-02 ENCOUNTER — Ambulatory Visit: Payer: Self-pay

## 2023-12-03 ENCOUNTER — Inpatient Hospital Stay: Attending: Hematology and Oncology

## 2023-12-03 VITALS — BP 114/56 | HR 60 | Temp 97.7°F | Resp 16

## 2023-12-03 DIAGNOSIS — Z79899 Other long term (current) drug therapy: Secondary | ICD-10-CM | POA: Diagnosis not present

## 2023-12-03 DIAGNOSIS — Z800 Family history of malignant neoplasm of digestive organs: Secondary | ICD-10-CM | POA: Diagnosis not present

## 2023-12-03 DIAGNOSIS — Z8041 Family history of malignant neoplasm of ovary: Secondary | ICD-10-CM | POA: Insufficient documentation

## 2023-12-03 DIAGNOSIS — Z806 Family history of leukemia: Secondary | ICD-10-CM | POA: Diagnosis not present

## 2023-12-03 DIAGNOSIS — Z170 Estrogen receptor positive status [ER+]: Secondary | ICD-10-CM | POA: Diagnosis not present

## 2023-12-03 DIAGNOSIS — C50411 Malignant neoplasm of upper-outer quadrant of right female breast: Secondary | ICD-10-CM | POA: Diagnosis present

## 2023-12-03 DIAGNOSIS — Z803 Family history of malignant neoplasm of breast: Secondary | ICD-10-CM | POA: Insufficient documentation

## 2023-12-03 DIAGNOSIS — Z5111 Encounter for antineoplastic chemotherapy: Secondary | ICD-10-CM | POA: Insufficient documentation

## 2023-12-03 DIAGNOSIS — Z808 Family history of malignant neoplasm of other organs or systems: Secondary | ICD-10-CM | POA: Insufficient documentation

## 2023-12-03 MED ORDER — GOSERELIN ACETATE 3.6 MG ~~LOC~~ IMPL
3.6000 mg | DRUG_IMPLANT | Freq: Once | SUBCUTANEOUS | Status: AC
  Administered 2023-12-03: 3.6 mg via SUBCUTANEOUS
  Filled 2023-12-03: qty 3.6

## 2023-12-04 NOTE — Addendum Note (Signed)
 Addended by: GRETEL CELENA BROCKS on: 12/04/2023 12:28 PM   Modules accepted: Orders

## 2023-12-05 ENCOUNTER — Encounter: Admitting: Physical Therapy

## 2023-12-06 ENCOUNTER — Inpatient Hospital Stay

## 2023-12-09 ENCOUNTER — Ambulatory Visit: Admitting: Physical Therapy

## 2023-12-09 DIAGNOSIS — R293 Abnormal posture: Secondary | ICD-10-CM

## 2023-12-09 DIAGNOSIS — R279 Unspecified lack of coordination: Secondary | ICD-10-CM | POA: Diagnosis not present

## 2023-12-09 DIAGNOSIS — M6281 Muscle weakness (generalized): Secondary | ICD-10-CM

## 2023-12-09 NOTE — Therapy (Signed)
 OUTPATIENT PHYSICAL THERAPY FEMALE PELVIC RE-EVALUATION   Patient Name: Kaitlin [name] MRN: [id number] DOB:[birth year], 48 y.o., female Today's Date: 12/09/2023  END OF SESSION:  PT End of Session - 12/09/23 1140     Visit Number 1    Number of Visits 10    Date for Recertification  02/17/24    Authorization Type [employer]    PT Start Time 1100    PT Stop Time 1145    PT Time Calculation (min) 45 min    Activity Tolerance Patient tolerated treatment well    Behavior During Therapy WFL for tasks assessed/performed         Past Medical History:  Diagnosis Date   Allergy    Anal fissure    Anemia    Asthma    Breast cancer (HCC)    twice   Cardiomyopathy (HCC)    Chronic diarrhea 11/05/2023   Chronic headaches    Delivery normal 11/2008   Family history of colon cancer    Family history of leukemia    Family history of melanoma    Family history of ovarian cancer    Heart murmur    Kidney stone    x of   PONV (postoperative nausea and vomiting)    S/P tonsillectomy    2000   Past Surgical History:  Procedure Laterality Date   BREAST BIOPSY Right 09/12/2022   US  RT BREAST BX W LOC DEV 1ST LESION IMG BX SPEC US  GUIDE 09/12/2022 [facility]   BREAST BIOPSY Left 01/18/2023   US  LT BREAST BX W LOC DEV 1ST LESION IMG BX SPEC US  GUIDE 01/18/2023 [facility]   BREAST CYST EXCISION Right 09/26/2022   Procedure: RIGHT BREAST CANCER RECURRENT EXCISION;  Surgeon: Ebbie Cough, MD;  Location: [facility];  Service: General;  Laterality: Right;  LMA   BREAST RECONSTRUCTION WITH PLACEMENT OF TISSUE EXPANDER AND ALLODERM Bilateral 12/06/2020   Procedure: BREAST RECONSTRUCTION WITH PLACEMENT OF TISSUE EXPANDER AND ALLODERM;  Surgeon: Arelia Filippo, MD;  Location: [facility];  Service: Plastics;  Laterality: Bilateral;   LEEP     MASTECTOMY W/ SENTINEL NODE BIOPSY Right 12/06/2020   Procedure: RIGHT MASTECTOMY WITH RIGHT AXILLARY SENTINEL  LYMPH NODE BIOPSY;  Surgeon: Ebbie Cough, MD;  Location: [facility];  Service: General;  Laterality: Right;   REMOVAL OF BILATERAL TISSUE EXPANDERS WITH PLACEMENT OF BILATERAL BREAST IMPLANTS Bilateral 09/15/2021   Procedure: REMOVAL OF BILATERAL TISSUE EXPANDERS WITH PLACEMENT OF BILATERAL BREAST IMPLANTS;  Surgeon: Arelia Filippo, MD;  Location: [facility];  Service: Plastics;  Laterality: Bilateral;   tonsil removal  01/22/1998   TOTAL MASTECTOMY Left 12/06/2020   Procedure: LEFT TOTAL MASTECTOMY;  Surgeon: Ebbie Cough, MD;  Location: [facility];  Service: General;  Laterality: Left;   Patient Active Problem List   Diagnosis Date Noted   Chronic diarrhea 11/05/2023   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   Malignant neoplasm of upper-outer quadrant of right breast in female, estrogen receptor positive (HCC) 12/12/2020   S/P bilateral mastectomy 12/06/2020   Genetic testing 11/17/2020   Family history of breast cancer 11/02/2020   Family history of ovarian cancer 11/02/2020   Family history of colon cancer 11/02/2020   Family history of melanoma 11/02/2020   Family history of leukemia 11/02/2020   Bilateral breast cancer (HCC) 11/01/2020   LLQ abdominal pain 04/06/2011    PCP: Claudene Pellet, MD  REFERRING PROVIDER: Odean,  Mackey, MD   REFERRING DIAG: Z78.0 (ICD-10-CM) - Post-menopausal  THERAPY DIAG:  Muscle weakness (generalized)  Abnormal posture  Rationale for Evaluation and Treatment: Rehabilitation  ONSET DATE: 2010  SUBJECTIVE:                                                                                                                                                                                           SUBJECTIVE STATEMENT: Re-eval: Patient reports that she is doing well today. Back pain has been better. Shoulder has been the most sore recently. She went and got some custom bras  at [employer] and she learned that she needs tighter bras for more support. She thinks that it is helping, but she is noticing some residual shoulder discomfort. Patient reports that she has addressed the majority of her goals - other than painful intercourse. This has been due to the lack of time. She wishes to continue pelvic floor PT to address vaginal wall tension and uncomfortable intercourse. She tries to have intercourse and it feels like a chore. She used her dilators a week ago and hasn't noticed much improvement.   From eval: Patient reports that she had breast cancer in 2022 and a local reoccurrence in Sept 2024, which led to her being placed in medical menopause in Oct of 2024. Since then, she has experienced vaginal dryness and tightness in the pelvic floor, contributing to hemorrhoids and painful intercourse. In 2010 and 2014 she had vaginal deliveries, both with tearing. She has seen a GI specialist and plans to have a colonoscopy soon. She has seen a pelvic PT for 3 visits and from that experience she has dilators (6 different sizes - has not used these yet) and cups. She has had a double mastectomy and has lots of scar tissue under the ribs, which she has used the cups for in the past. She feels like her pelvic floor gets crampy every now and again. No bladder related issues to report. She has a vibration plate and rebounder at home. She sees a chiro 1x/wk consistently.  Fluid intake: drinks a lot of water - tries to get 60-90oz   PAIN:  Are you having pain? No NPRS scale: 0/10 Pain with intercourse 12/09/23: 8/10    PRECAUTIONS: None  RED FLAGS: None   WEIGHT BEARING RESTRICTIONS: No  FALLS:  Has patient fallen in last 6 months? No  OCCUPATION: works currently with college students   ACTIVITY LEVEL : works out 5-6x/wk at [employer], walks 1 mile most days  PLOF: Independent with basic ADLs  PATIENT GOALS: decrease pelvic floor tension at rest and hemorrhoid  discomfort,  decrease scar tissue restriction present from mastectomy surgery and from vaginal tearing during deliveries   PERTINENT HISTORY:  LEEP vaginally 2015, double mastectomy, 3 endometrial polyps all benign in 09/2022   BOWEL MOVEMENT: Pain with bowel movement: No - will only have pain with hemorrhoids when they are present  Type of bowel movement:Type (Bristol Stool Scale) 3-4, Frequency 1-2x/day, Strain no, and Splinting no Fully empty rectum: No  Leakage: No Pads: No Fiber supplement/laxative Yes   URINATION: Pain with urination: No Fully empty bladder: Yes:   Stream: Strong Urgency: Yes  Frequency: within normal limits  Leakage: none Pads: No  INTERCOURSE: not currently sexually active due to pain - feels like barbwire when she tries to have intercourse    Ability to have vaginal penetration No  Pain with intercourse: Pain Interrupts Intercourse DrynessYes  Marinoff Scale: 3/3  PREGNANCY: Vaginal deliveries 2 Tearing Yes: with both deliveries, first tear was lateral in nature   PROLAPSE: None  OBJECTIVE:  Note: Objective measures were completed at Evaluation unless otherwise noted.  PATIENT SURVEYS:  PFIQ-7: 45  COGNITION: Overall cognitive status: Within functional limits for tasks assessed     SENSATION: Light touch: Appears intact  LUMBAR SPECIAL TESTS:  Single leg stance test: Negative  FUNCTIONAL TESTS:  Squat test: mild dynamic knee valgus with loading, no significant lumbopelvic stiffness present   GAIT: Assistive device utilized: None Comments: mild trendelenburg gait pattern with ambulation   POSTURE: rounded shoulders  LUMBARAROM/PROM:  A/PROM A/PROM  eval  Flexion Within normal limits   Extension Within normal limits   Right lateral flexion Within normal limits   Left lateral flexion Within normal limits   Right rotation Within normal limits   Left rotation Within normal limits    (Blank rows = not tested)  LOWER EXTREMITY  ROM: within functional limits   LOWER EXTREMITY MMT: 4/5 bilateral knees and hips grossly  PALPATION:   General: no significant tenderness to palpation of bilateral adductors or hip flexors   Pelvic Alignment: within normal limits   Abdominal: upper chest breathing and abdominal bracing at rest, decreased lower rib excursion with inhalation                 External Perineal Exam: dryness noted with mild clitoral hood mobility restriction present                              Internal Pelvic Floor: decreased tension at baseline, pain with palpation of right sided scar tissue from birth of son   Patient confirms identification and approves PT to assess internal pelvic floor and treatment Yes No emotional/communication barriers or cognitive limitation. Patient is motivated to learn. Patient understands and agrees with treatment goals and plan. PT explains patient will be examined in standing, sitting, and lying down to see how their muscles and joints work. When they are ready, they will be asked to remove their underwear so PT can examine their perineum. The patient is also given the option of providing their own chaperone as one is not provided in our facility. The patient also has the right and is explained the right to defer or refuse any part of the evaluation or treatment including the internal exam. With the patient's consent, PT will use one gloved finger to gently assess the muscles of the pelvic floor, seeing how well it contracts and relaxes and if there is muscle symmetry. After, the patient will get dressed  and PT and patient will discuss exam findings and plan of care. PT and patient discuss plan of care, schedule, attendance policy and HEP activities.  PELVIC MMT:   MMT eval Re-eval 10/02/23 Re-eval 12/09/23  Vaginal 4/5, 5 quick flicks, 5 second hold 5/5 with improved ability to relax  Strength within normal limits, increased tone at perineal body and surrounding introitus  Internal  Anal Sphincter     External Anal Sphincter     Puborectalis     Diastasis Recti     (Blank rows = not tested)  TONE: Increased following pelvic floor muscle contraction  PROLAPSE: N/A   TODAY'S TREATMENT:                                                                                                                              DATE:   11/12/23: Internal vaginal superficial and deep pelvic floor musculature release with focus on sustained pressure and sweeping release techniques  Internal vaginal canal mobilization/stretching to decrease sensitivity at introitus  Deep diaphragmatic breathing to relax pelvic floor musculature in supine 2x3 breaths  Deep pelvic floor release: bilateral sweeping and sustained pressure techniques educated on the [employer] penetration device to help limit the depth of penetration with her partner  11/26/23: Vibration plate for pelvic floor relaxation:  Standing with soft bend in knees + diaphragmatic breathing  Seated with internally rotated hips + diaphragmatic breathing 2x20min  Seated with externally rotated hips + diaphragmatic breathing 2x23min  Standing adductor stretch + diaphragmatic breathing 2x63min each side  Standing hamstring stretch + diaphragmatic breathing 2x70min each side Internal vaginal superficial and deep pelvic floor musculature release with focus on sustained pressure and sweeping release techniques  Internal vaginal canal mobilization/stretching to decrease sensitivity at introitus  Deep diaphragmatic breathing to relax pelvic floor musculature in supine 2x3 breaths  Deep pelvic floor release: bilateral sweeping and sustained pressure techniques  12/09/23: reevaluation day  Patient found to demonstrate increased tone around ribcage and pelvic floor/abdomen. Pelvic floor exam revealed the most tension present at left sided introitus region surrounding birth scar from internal tearing on right. Patient able to actively lengthen/bulge  musculature much more effectively compared to initial evaluation. She still demonstrates increased tone at rest and a shallow vaginal canal from cancer therapy and lack of estrogen. There is decreased blood flow present in bilateral superficial and deep labial musculature.  PATIENT EDUCATION:  Education details: Relative anatomy and the connection between the diaphragm and pelvic floor, silicone based lubricant samples and education, vaginal moisturizer education Person educated: Patient Education method: Explanation, Demonstration, Tactile cues, Verbal cues, and Handouts Education comprehension: verbalized understanding, returned demonstration, verbal cues required, tactile cues required, and needs further education  HOME EXERCISE PROGRAM: Access Code: [email or web address] URL: [email or web address] Date: 10/02/2023 Prepared by: Celena Domino  Exercises - Yoga Squat for Pelvic Floor Relaxation with Yoga Block  - 1 x daily - 7 x weekly - 3 sets - hold  ASSESSMENT:  CLINICAL IMPRESSION: Patient is a 48 y.o. female  who was seen today for physical therapy re-evaluation for pain with vaginal penetration/during intercourse. Pain can reach 8/10 with initial penetration. She has dilators at home and has worked up to size 2 with no further success. Patient found to demonstrate increased tone around ribcage and pelvic floor/abdomen. Pelvic floor exam revealed the most tension present at left sided introitus region surrounding birth scar from internal tearing on right. Patient able to actively lengthen/bulge musculature much more effectively compared to initial evaluation. She still demonstrates increased tone at rest and a shallow vaginal canal from cancer therapy and lack of estrogen. There is decreased blood flow present in bilateral superficial and deep labial musculature. We discussed continuation of dilator training, along with potential topical estrogen use in the future if dilator training is  not sufficient. Overall, patient tolerated session well and Pt would benefit from additional PT to further address deficits.    OBJECTIVE IMPAIRMENTS: decreased coordination, decreased endurance, decreased mobility, decreased ROM, decreased strength, and pain.   ACTIVITY LIMITATIONS: vaginal penetration during intercourse/pelvic examinations   PARTICIPATION LIMITATIONS: N/A  PERSONAL FACTORS: Age, Past/current experiences, and Time since onset of injury/illness/exacerbation are also affecting patient's functional outcome.   REHAB POTENTIAL: Good  CLINICAL DECISION MAKING: Stable/uncomplicated  EVALUATION COMPLEXITY: Low   GOALS: Goals reviewed with patient? Yes  SHORT TERM GOALS: Target date: 10/30/2023  Pt will be independent with HEP.  Baseline: Goal status:  GOAL MET 12/09/23  2.  Pt will be independent with diaphragmatic breathing and down training activities in order to improve pelvic floor relaxation. Baseline:  Goal status:  GOAL MET 12/09/23  3.  Pt will be ind with using dilators for reducing pelvic floor tension and pain management to decrease pain during vaginal penetration and improve quality oflife.  Goal status: GOAL MET 12/09/23   LONG TERM GOALS: Target date: 03/31/2024  Pt will be independent with advanced HEP.  Baseline:  Goal status:  ONGOING 12/09/23  2.  Pt will be ind with using dilator size 3 for reducing pelvic floor tension and pain management to allow patient to return to vaginal penetration without significant pain to improve quality of life and relationship with partner.  Baseline: dilator size 2  Goal status: ONGOING 12/09/23  3.  Pt will report less than 5/10 pain with vaginal penetration in order to improve intimate relationship with partner and allow patient to participate in pelvic examinations with little to no pain. Baseline: 8/10  Goal status:  ONGOING 12/09/23  4. Patient will report confidence with all intercourse management  techniques including lubricant use, dilator training, foreplay, positioning, and downtraining techniques to decrease pain with intercourse as much as possible to improve quality of life.   Goal status: ONGOING 12/09/23  PLAN:  PT FREQUENCY: 1x/week  PT DURATION: 10 weeks  PLANNED INTERVENTIONS: 97110-Therapeutic exercises, 97530- Therapeutic activity, 97112- Neuromuscular re-education, 97535- Self Care, [id number]- Manual therapy, Patient/Family education, Taping, Dry Needling, Joint mobilization, Spinal mobilization, Scar mobilization, Cryotherapy, and Moist heat  PLAN FOR NEXT SESSION: continued internal treatment to promote vaginal wall lengthening/stretching, downtraining techniques for overactivity of pelvic floor, vibration plate   Celena JAYSON Domino, PT 12/09/2023, 11:46 AM

## 2023-12-24 ENCOUNTER — Ambulatory Visit: Attending: Hematology and Oncology | Admitting: Physical Therapy

## 2023-12-24 DIAGNOSIS — R293 Abnormal posture: Secondary | ICD-10-CM | POA: Insufficient documentation

## 2023-12-24 DIAGNOSIS — M6281 Muscle weakness (generalized): Secondary | ICD-10-CM | POA: Diagnosis present

## 2023-12-24 NOTE — Therapy (Signed)
 OUTPATIENT PHYSICAL THERAPY FEMALE PELVIC TREATMENT   Patient Name: Kaitlin [name] MRN: [id number] DOB:04-06-1975, 48 y.o., female Today's Date: 12/24/2023  END OF SESSION:  PT End of Session - 12/24/23 1532     Visit Number 2    Number of Visits 10    Date for Recertification  02/17/24    Authorization Type [employer]    PT Start Time 0245    PT Stop Time 0330    PT Time Calculation (min) 45 min    Activity Tolerance Patient tolerated treatment well    Behavior During Therapy WFL for tasks assessed/performed          Past Medical History:  Diagnosis Date   Allergy    Anal fissure    Anemia    Asthma    Breast cancer (HCC)    twice   Cardiomyopathy (HCC)    Chronic diarrhea 11/05/2023   Chronic headaches    Delivery normal 11/2008   Family history of colon cancer    Family history of leukemia    Family history of melanoma    Family history of ovarian cancer    Heart murmur    Kidney stone    x of   PONV (postoperative nausea and vomiting)    S/P tonsillectomy    2000   Past Surgical History:  Procedure Laterality Date   BREAST BIOPSY Right 09/12/2022   US  RT BREAST BX W LOC DEV 1ST LESION IMG BX SPEC US  GUIDE 09/12/2022 [facility]   BREAST BIOPSY Left 01/18/2023   US  LT BREAST BX W LOC DEV 1ST LESION IMG BX SPEC US  GUIDE 01/18/2023 [facility]   BREAST CYST EXCISION Right 09/26/2022   Procedure: RIGHT BREAST CANCER RECURRENT EXCISION;  Surgeon: Ebbie Cough, MD;  Location: [facility];  Service: General;  Laterality: Right;  LMA   BREAST RECONSTRUCTION WITH PLACEMENT OF TISSUE EXPANDER AND ALLODERM Bilateral 12/06/2020   Procedure: BREAST RECONSTRUCTION WITH PLACEMENT OF TISSUE EXPANDER AND ALLODERM;  Surgeon: Arelia Filippo, MD;  Location: [facility];  Service: Plastics;  Laterality: Bilateral;   LEEP     MASTECTOMY W/ SENTINEL NODE BIOPSY Right 12/06/2020   Procedure: RIGHT MASTECTOMY WITH RIGHT AXILLARY SENTINEL  LYMPH NODE BIOPSY;  Surgeon: Ebbie Cough, MD;  Location: [facility];  Service: General;  Laterality: Right;   REMOVAL OF BILATERAL TISSUE EXPANDERS WITH PLACEMENT OF BILATERAL BREAST IMPLANTS Bilateral 09/15/2021   Procedure: REMOVAL OF BILATERAL TISSUE EXPANDERS WITH PLACEMENT OF BILATERAL BREAST IMPLANTS;  Surgeon: Arelia Filippo, MD;  Location: [facility];  Service: Plastics;  Laterality: Bilateral;   tonsil removal  01/22/1998   TOTAL MASTECTOMY Left 12/06/2020   Procedure: LEFT TOTAL MASTECTOMY;  Surgeon: Ebbie Cough, MD;  Location: [facility];  Service: General;  Laterality: Left;   Patient Active Problem List   Diagnosis Date Noted   Chronic diarrhea 11/05/2023   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   Malignant neoplasm of upper-outer quadrant of right breast in female, estrogen receptor positive (HCC) 12/12/2020   S/P bilateral mastectomy 12/06/2020   Genetic testing 11/17/2020   Family history of breast cancer 11/02/2020   Family history of ovarian cancer 11/02/2020   Family history of colon cancer 11/02/2020   Family history of melanoma 11/02/2020   Family history of leukemia 11/02/2020   Bilateral breast cancer (HCC) 11/01/2020   LLQ abdominal pain 04/06/2011    PCP: Claudene Pellet, MD  REFERRING PROVIDER:  Odean Potts, MD   REFERRING DIAG: Z78.0 (ICD-10-CM) - Post-menopausal  THERAPY DIAG:  Muscle weakness (generalized)  Abnormal posture  Rationale for Evaluation and Treatment: Rehabilitation  ONSET DATE: 2010  SUBJECTIVE:                                                                                                                                                                                           SUBJECTIVE STATEMENT: Patient reports that she is doing well today. Patient reports that she is feeling good - feeling tight overall. She is seeing a PT tomorrow to learn more  about postural strengthening. She has not done any dilator training - daily vibration plate training.   Re-eval: Patient reports that she is doing well today. Back pain has been better. Shoulder has been the most sore recently. She went and got some custom bras at [employer] and she learned that she needs tighter bras for more support. She thinks that it is helping, but she is noticing some residual shoulder discomfort. Patient reports that she has addressed the majority of her goals - other than painful intercourse. This has been due to the lack of time. She wishes to continue pelvic floor PT to address vaginal wall tension and uncomfortable intercourse. She tries to have intercourse and it feels like a chore. She used her dilators a week ago and hasn't noticed much improvement.   From eval: Patient reports that she had breast cancer in 2022 and a local reoccurrence in Sept 2024, which led to her being placed in medical menopause in Oct of 2024. Since then, she has experienced vaginal dryness and tightness in the pelvic floor, contributing to hemorrhoids and painful intercourse. In 2010 and 2014 she had vaginal deliveries, both with tearing. She has seen a GI specialist and plans to have a colonoscopy soon. She has seen a pelvic PT for 3 visits and from that experience she has dilators (6 different sizes - has not used these yet) and cups. She has had a double mastectomy and has lots of scar tissue under the ribs, which she has used the cups for in the past. She feels like her pelvic floor gets crampy every now and again. No bladder related issues to report. She has a vibration plate and rebounder at home. She sees a chiro 1x/wk consistently.  Fluid intake: drinks a lot of water - tries to get 60-90oz   PAIN:  Are you having pain? No NPRS scale: 0/10 Pain with intercourse 12/09/23: 8/10    PRECAUTIONS: None  RED FLAGS: None   WEIGHT BEARING RESTRICTIONS: No  FALLS:  Has patient fallen  in  last 6 months? No  OCCUPATION: works currently with college students   ACTIVITY LEVEL : works out 5-6x/wk at [employer], walks 1 mile most days  PLOF: Independent with basic ADLs  PATIENT GOALS: decrease pelvic floor tension at rest and hemorrhoid discomfort, decrease scar tissue restriction present from mastectomy surgery and from vaginal tearing during deliveries   PERTINENT HISTORY:  LEEP vaginally 2015, double mastectomy, 3 endometrial polyps all benign in 09/2022   BOWEL MOVEMENT: Pain with bowel movement: No - will only have pain with hemorrhoids when they are present  Type of bowel movement:Type (Bristol Stool Scale) 3-4, Frequency 1-2x/day, Strain no, and Splinting no Fully empty rectum: No  Leakage: No Pads: No Fiber supplement/laxative Yes   URINATION: Pain with urination: No Fully empty bladder: Yes:   Stream: Strong Urgency: Yes  Frequency: within normal limits  Leakage: none Pads: No  INTERCOURSE: not currently sexually active due to pain - feels like barbwire when she tries to have intercourse    Ability to have vaginal penetration No  Pain with intercourse: Pain Interrupts Intercourse DrynessYes  Marinoff Scale: 3/3  PREGNANCY: Vaginal deliveries 2 Tearing Yes: with both deliveries, first tear was lateral in nature   PROLAPSE: None  OBJECTIVE:  Note: Objective measures were completed at Evaluation unless otherwise noted.  PATIENT SURVEYS:  PFIQ-7: 45  COGNITION: Overall cognitive status: Within functional limits for tasks assessed     SENSATION: Light touch: Appears intact  LUMBAR SPECIAL TESTS:  Single leg stance test: Negative  FUNCTIONAL TESTS:  Squat test: mild dynamic knee valgus with loading, no significant lumbopelvic stiffness present   GAIT: Assistive device utilized: None Comments: mild trendelenburg gait pattern with ambulation   POSTURE: rounded shoulders  LUMBARAROM/PROM:  A/PROM A/PROM  eval  Flexion Within normal  limits   Extension Within normal limits   Right lateral flexion Within normal limits   Left lateral flexion Within normal limits   Right rotation Within normal limits   Left rotation Within normal limits    (Blank rows = not tested)  LOWER EXTREMITY ROM: within functional limits   LOWER EXTREMITY MMT: 4/5 bilateral knees and hips grossly  PALPATION:   General: no significant tenderness to palpation of bilateral adductors or hip flexors   Pelvic Alignment: within normal limits   Abdominal: upper chest breathing and abdominal bracing at rest, decreased lower rib excursion with inhalation                 External Perineal Exam: dryness noted with mild clitoral hood mobility restriction present                              Internal Pelvic Floor: decreased tension at baseline, pain with palpation of right sided scar tissue from birth of son   Patient confirms identification and approves PT to assess internal pelvic floor and treatment Yes No emotional/communication barriers or cognitive limitation. Patient is motivated to learn. Patient understands and agrees with treatment goals and plan. PT explains patient will be examined in standing, sitting, and lying down to see how their muscles and joints work. When they are ready, they will be asked to remove their underwear so PT can examine their perineum. The patient is also given the option of providing their own chaperone as one is not provided in our facility. The patient also has the right and is explained the right to defer or refuse any part of  the evaluation or treatment including the internal exam. With the patient's consent, PT will use one gloved finger to gently assess the muscles of the pelvic floor, seeing how well it contracts and relaxes and if there is muscle symmetry. After, the patient will get dressed and PT and patient will discuss exam findings and plan of care. PT and patient discuss plan of care, schedule, attendance policy and  HEP activities.  PELVIC MMT:   MMT eval Re-eval 10/02/23 Re-eval 12/09/23  Vaginal 4/5, 5 quick flicks, 5 second hold 5/5 with improved ability to relax  Strength within normal limits, increased tone at perineal body and surrounding introitus  Internal Anal Sphincter     External Anal Sphincter     Puborectalis     Diastasis Recti     (Blank rows = not tested)  TONE: Increased following pelvic floor muscle contraction  PROLAPSE: N/A   TODAY'S TREATMENT:                                                                                                                              DATE:   11/26/23: Vibration plate for pelvic floor relaxation:  Standing with soft bend in knees + diaphragmatic breathing  Seated with internally rotated hips + diaphragmatic breathing 2x62min  Seated with externally rotated hips + diaphragmatic breathing 2x30min  Standing adductor stretch + diaphragmatic breathing 2x31min each side  Standing hamstring stretch + diaphragmatic breathing 2x68min each side Internal vaginal superficial and deep pelvic floor musculature release with focus on sustained pressure and sweeping release techniques  Internal vaginal canal mobilization/stretching to decrease sensitivity at introitus  Deep diaphragmatic breathing to relax pelvic floor musculature in supine 2x3 breaths  Deep pelvic floor release: bilateral sweeping and sustained pressure techniques  12/09/23: reevaluation day  Patient found to demonstrate increased tone around ribcage and pelvic floor/abdomen. Pelvic floor exam revealed the most tension present at left sided introitus region surrounding birth scar from internal tearing on right. Patient able to actively lengthen/bulge musculature much more effectively compared to initial evaluation. She still demonstrates increased tone at rest and a shallow vaginal canal from cancer therapy and lack of estrogen. There is decreased blood flow present in bilateral superficial  and deep labial musculature.  12/24/23: Vibration plate: Standing with soft bend in knees x85min  Piriformis stretch in seated x48min each side  Standing hamstring stretch x:30  Standing adductor groin stretch x:30  Internal vaginal superficial and deep pelvic floor musculature release with focus on sustained pressure and sweeping release techniques  Internal vaginal canal mobilization/stretching to decrease sensitivity at introitus  Deep diaphragmatic breathing to relax pelvic floor musculature in supine 2x3 breaths  Deep pelvic floor release: bilateral sweeping and sustained pressure techniques  PATIENT EDUCATION:  Education details: Relative anatomy and the connection between the diaphragm and pelvic floor, silicone based lubricant samples and education, vaginal moisturizer education Person educated: Patient Education method: Explanation, Demonstration, Tactile cues, Verbal cues, and Handouts  Education comprehension: verbalized understanding, returned demonstration, verbal cues required, tactile cues required, and needs further education  HOME EXERCISE PROGRAM: Access Code: [email or web address] URL: [email or web address] Date: 10/02/2023 Prepared by: Celena Domino  Exercises - Yoga Squat for Pelvic Floor Relaxation with Yoga Block  - 1 x daily - 7 x weekly - 3 sets - hold  ASSESSMENT:  CLINICAL IMPRESSION: Patient is a 48 y.o. female  who was seen today for physical therapy treatment for pain with vaginal penetration/during intercourse. Patient reports no intercourse pain or attempts recently. She has not been consistent with dilator training but plans to be. She tolerated vibration plate training and internal treatment very well. Pt found to have increased deep pelvic floor tension in right side of pelvic floor compared to left. Right levator ani and obturator internus responded well to sustained pressure release. Overall, patient tolerated session well and Pt would benefit from  additional PT to further address deficits.    OBJECTIVE IMPAIRMENTS: decreased coordination, decreased endurance, decreased mobility, decreased ROM, decreased strength, and pain.   ACTIVITY LIMITATIONS: vaginal penetration during intercourse/pelvic examinations   PARTICIPATION LIMITATIONS: N/A  PERSONAL FACTORS: Age, Past/current experiences, and Time since onset of injury/illness/exacerbation are also affecting patient's functional outcome.   REHAB POTENTIAL: Good  CLINICAL DECISION MAKING: Stable/uncomplicated  EVALUATION COMPLEXITY: Low   GOALS: Goals reviewed with patient? Yes  SHORT TERM GOALS: Target date: 10/30/2023  Pt will be independent with HEP.  Baseline: Goal status:  GOAL MET 12/09/23  2.  Pt will be independent with diaphragmatic breathing and down training activities in order to improve pelvic floor relaxation. Baseline:  Goal status:  GOAL MET 12/09/23  3.  Pt will be ind with using dilators for reducing pelvic floor tension and pain management to decrease pain during vaginal penetration and improve quality oflife.  Goal status: GOAL MET 12/09/23   LONG TERM GOALS: Target date: 03/31/2024  Pt will be independent with advanced HEP.  Baseline:  Goal status:  ONGOING 12/09/23  2.  Pt will be ind with using dilator size 3 for reducing pelvic floor tension and pain management to allow patient to return to vaginal penetration without significant pain to improve quality of life and relationship with partner.  Baseline: dilator size 2  Goal status: ONGOING 12/09/23  3.  Pt will report less than 5/10 pain with vaginal penetration in order to improve intimate relationship with partner and allow patient to participate in pelvic examinations with little to no pain. Baseline: 8/10  Goal status:  ONGOING 12/09/23  4. Patient will report confidence with all intercourse management techniques including lubricant use, dilator training, foreplay, positioning, and  downtraining techniques to decrease pain with intercourse as much as possible to improve quality of life.   Goal status: ONGOING 12/09/23  PLAN:  PT FREQUENCY: 1x/week  PT DURATION: 10 weeks  PLANNED INTERVENTIONS: 97110-Therapeutic exercises, 97530- Therapeutic activity, 97112- Neuromuscular re-education, 97535- Self Care, [id number]- Manual therapy, Patient/Family education, Taping, Dry Needling, Joint mobilization, Spinal mobilization, Scar mobilization, Cryotherapy, and Moist heat  PLAN FOR NEXT SESSION: continued internal treatment to promote vaginal wall lengthening/stretching, downtraining techniques for overactivity of pelvic floor, vibration plate   Celena JAYSON Domino, PT 12/24/2023, 3:32 PM

## 2023-12-31 ENCOUNTER — Inpatient Hospital Stay: Attending: Hematology and Oncology

## 2023-12-31 VITALS — BP 113/73 | HR 61 | Temp 98.1°F | Resp 17

## 2023-12-31 DIAGNOSIS — Z170 Estrogen receptor positive status [ER+]: Secondary | ICD-10-CM

## 2023-12-31 DIAGNOSIS — Z79899 Other long term (current) drug therapy: Secondary | ICD-10-CM | POA: Insufficient documentation

## 2023-12-31 DIAGNOSIS — C50411 Malignant neoplasm of upper-outer quadrant of right female breast: Secondary | ICD-10-CM | POA: Diagnosis present

## 2023-12-31 DIAGNOSIS — Z5111 Encounter for antineoplastic chemotherapy: Secondary | ICD-10-CM | POA: Diagnosis present

## 2023-12-31 MED ORDER — GOSERELIN ACETATE 3.6 MG ~~LOC~~ IMPL
3.6000 mg | DRUG_IMPLANT | Freq: Once | SUBCUTANEOUS | Status: AC
  Administered 2023-12-31: 3.6 mg via SUBCUTANEOUS
  Filled 2023-12-31: qty 3.6

## 2024-01-01 ENCOUNTER — Encounter: Payer: Self-pay | Admitting: Hematology and Oncology

## 2024-01-06 ENCOUNTER — Inpatient Hospital Stay

## 2024-01-24 ENCOUNTER — Encounter: Payer: Self-pay | Admitting: Hematology and Oncology

## 2024-01-28 ENCOUNTER — Inpatient Hospital Stay

## 2024-01-28 ENCOUNTER — Inpatient Hospital Stay: Attending: Hematology and Oncology

## 2024-01-28 ENCOUNTER — Inpatient Hospital Stay: Admitting: Hematology and Oncology

## 2024-01-28 VITALS — BP 110/64 | HR 81 | Temp 97.9°F | Resp 18 | Ht 67.0 in | Wt 150.5 lb

## 2024-01-28 DIAGNOSIS — R197 Diarrhea, unspecified: Secondary | ICD-10-CM | POA: Insufficient documentation

## 2024-01-28 DIAGNOSIS — C50911 Malignant neoplasm of unspecified site of right female breast: Secondary | ICD-10-CM

## 2024-01-28 DIAGNOSIS — R0789 Other chest pain: Secondary | ICD-10-CM | POA: Diagnosis not present

## 2024-01-28 DIAGNOSIS — C50411 Malignant neoplasm of upper-outer quadrant of right female breast: Secondary | ICD-10-CM | POA: Diagnosis present

## 2024-01-28 DIAGNOSIS — D72819 Decreased white blood cell count, unspecified: Secondary | ICD-10-CM | POA: Insufficient documentation

## 2024-01-28 DIAGNOSIS — Z880 Allergy status to penicillin: Secondary | ICD-10-CM | POA: Insufficient documentation

## 2024-01-28 DIAGNOSIS — Z170 Estrogen receptor positive status [ER+]: Secondary | ICD-10-CM

## 2024-01-28 DIAGNOSIS — Z79811 Long term (current) use of aromatase inhibitors: Secondary | ICD-10-CM | POA: Diagnosis not present

## 2024-01-28 DIAGNOSIS — R232 Flushing: Secondary | ICD-10-CM | POA: Diagnosis not present

## 2024-01-28 DIAGNOSIS — Z79899 Other long term (current) drug therapy: Secondary | ICD-10-CM | POA: Diagnosis not present

## 2024-01-28 DIAGNOSIS — K5900 Constipation, unspecified: Secondary | ICD-10-CM | POA: Insufficient documentation

## 2024-01-28 DIAGNOSIS — Z1732 Human epidermal growth factor receptor 2 negative status: Secondary | ICD-10-CM | POA: Diagnosis not present

## 2024-01-28 DIAGNOSIS — C50912 Malignant neoplasm of unspecified site of left female breast: Secondary | ICD-10-CM

## 2024-01-28 DIAGNOSIS — Z5111 Encounter for antineoplastic chemotherapy: Secondary | ICD-10-CM | POA: Insufficient documentation

## 2024-01-28 DIAGNOSIS — Z9013 Acquired absence of bilateral breasts and nipples: Secondary | ICD-10-CM | POA: Diagnosis not present

## 2024-01-28 DIAGNOSIS — R5383 Other fatigue: Secondary | ICD-10-CM | POA: Insufficient documentation

## 2024-01-28 DIAGNOSIS — Z881 Allergy status to other antibiotic agents status: Secondary | ICD-10-CM | POA: Insufficient documentation

## 2024-01-28 DIAGNOSIS — Z1721 Progesterone receptor positive status: Secondary | ICD-10-CM | POA: Insufficient documentation

## 2024-01-28 LAB — CBC WITH DIFFERENTIAL (CANCER CENTER ONLY)
Abs Immature Granulocytes: 0.01 K/uL (ref 0.00–0.07)
Basophils Absolute: 0.1 K/uL (ref 0.0–0.1)
Basophils Relative: 2 %
Eosinophils Absolute: 0.1 K/uL (ref 0.0–0.5)
Eosinophils Relative: 3 %
HCT: 33.1 % — ABNORMAL LOW (ref 36.0–46.0)
Hemoglobin: 11.4 g/dL — ABNORMAL LOW (ref 12.0–15.0)
Immature Granulocytes: 0 %
Lymphocytes Relative: 36 %
Lymphs Abs: 0.9 K/uL (ref 0.7–4.0)
MCH: 32.3 pg (ref 26.0–34.0)
MCHC: 34.4 g/dL (ref 30.0–36.0)
MCV: 93.8 fL (ref 80.0–100.0)
Monocytes Absolute: 0.3 K/uL (ref 0.1–1.0)
Monocytes Relative: 10 %
Neutro Abs: 1.3 K/uL — ABNORMAL LOW (ref 1.7–7.7)
Neutrophils Relative %: 49 %
Platelet Count: 257 K/uL (ref 150–400)
RBC: 3.53 MIL/uL — ABNORMAL LOW (ref 3.87–5.11)
RDW: 13.3 % (ref 11.5–15.5)
WBC Count: 2.6 K/uL — ABNORMAL LOW (ref 4.0–10.5)
nRBC: 0 % (ref 0.0–0.2)

## 2024-01-28 LAB — CMP (CANCER CENTER ONLY)
ALT: 17 U/L (ref 0–44)
AST: 25 U/L (ref 15–41)
Albumin: 4.1 g/dL (ref 3.5–5.0)
Alkaline Phosphatase: 63 U/L (ref 38–126)
Anion gap: 7 (ref 5–15)
BUN: 13 mg/dL (ref 6–20)
CO2: 27 mmol/L (ref 22–32)
Calcium: 9.2 mg/dL (ref 8.9–10.3)
Chloride: 106 mmol/L (ref 98–111)
Creatinine: 0.84 mg/dL (ref 0.44–1.00)
GFR, Estimated: >60 mL/min
Glucose, Bld: 119 mg/dL — ABNORMAL HIGH (ref 70–99)
Potassium: 4 mmol/L (ref 3.5–5.1)
Sodium: 139 mmol/L (ref 135–145)
Total Bilirubin: 0.3 mg/dL (ref 0.0–1.2)
Total Protein: 6.6 g/dL (ref 6.5–8.1)

## 2024-01-28 MED ORDER — GOSERELIN ACETATE 3.6 MG ~~LOC~~ IMPL
3.6000 mg | DRUG_IMPLANT | Freq: Once | SUBCUTANEOUS | Status: AC
  Administered 2024-01-28: 3.6 mg via SUBCUTANEOUS
  Filled 2024-01-28: qty 3.6

## 2024-01-28 NOTE — Assessment & Plan Note (Addendum)
 12/06/20: Bilateral Mastectomies:  Left Mastectomy:HG DCIS 2.2 cm with necrosis, Margins Neg ER 90%, PR 40% Right Mastectomy: Grade 2 IDC with DCIS 3.8 cm, 1/3 LN ER 100%, PR 100%, Her 2 Neg, Ki 67: 2-15%   Treatment Plan: 1. Mammaprint: Luminal type a low risk 2. Adjuvant XRT completed 03/14/2021 3. Adjuvant Anti-estrogen therapy with tamoxifen  03/22/2021-08/29/22 4. Recurrence:  Right breast palpable mass: Ultrasound: 6 mm, biopsy: Grade 2 IDC ER 95%, PR 95%, Ki67 30%, HER2 2+ by IHC FISH negative 09/25/2022: PET/CT: Benign --------------------------------------------------------------------------------------------------------------------- Treatment plan: Right lumpectomy: 09/26/2022: Recurrent IDC grade 2 measuring 0.8 cm, margins negative,ER 95%, PR 95%, Ki67 30%, HER2 2+ by IHC FISH negative Adjuvant ovarian function suppression with anastrozole .  Along with Verzinio started 11/09/2022 tolerating 50 p.o. twice daily very well   Surveillance: guardant reveal June 2025: Negative   Abemaciclib  toxicities:  Intermittent diarrhea Constipation causing hemorrhoidal discomfort Hot flashes and vaginal dryness: Recent issues with cycles: Will check estradiol  levels Muscle aches and pains:    Continue with monthly injections for Zoladex  Return to clinic every 3 months for follow-ups with labs

## 2024-01-28 NOTE — Progress Notes (Signed)
 "  Patient Care Team: Claudene Pellet, MD as PCP - General (Family Medicine) Odean Potts, MD as Consulting Physician (Hematology and Oncology) Arelia Filippo, MD as Consulting Physician (Plastic Surgery) Dewey Rush, MD as Consulting Physician (Radiation Oncology) Ebbie Cough, MD as Consulting Physician (General Surgery) Lucila Rush LABOR, RPH-CPP as Pharmacist (Hematology and Oncology)  DIAGNOSIS:  Encounter Diagnosis  Name Primary?   Bilateral malignant neoplasm of breast in female, estrogen receptor positive, unspecified site of breast (HCC) Yes    SUMMARY OF ONCOLOGIC HISTORY: Oncology History  Bilateral breast cancer (HCC)  11/01/2020 Initial Diagnosis   Right breast biopsy UOQ 9:30 position: Grade 2 IDC with DCIS ER 100%, PR [place], Ki-67 2%, HER2 negative Right breast biopsy LIQ: Grade 1 IDC with DCIS Left breast biopsy UOQ: DCIS intermediate grade    11/01/2020 Cancer Staging   Staging form: Breast, AJCC 8th Edition - Clinical: Stage IIA (cT3, cN0, cM0, G2, ER+, PR+, HER2-) - Signed by Odean Potts, MD on 11/01/2020 Stage prefix: Initial diagnosis Histologic grading system: 3 grade system   11/16/2020 Genetic Testing   Negative genetic testing on the CancerNext-Expanded+RNAinsight.  The report date is 11/16/2020  The CancerNext-Expanded gene panel offered by [facility] and includes sequencing and rearrangement analysis for the following 77 genes: AIP, ALK, APC*, ATM*, AXIN2, BAP1, BARD1, BLM, BMPR1A, BRCA1*, BRCA2*, BRIP1*, CDC73, CDH1*, CDK4, CDKN1B, CDKN2A, CHEK2*, CTNNA1, DICER1, FANCC, FH, FLCN, GALNT12, KIF1B, LZTR1, MAX, MEN1, MET, MLH1*, MSH2*, MSH3, MSH6*, MUTYH*, NBN, NF1*, NF2, NTHL1, PALB2*, PHOX2B, PMS2*, POT1, PRKAR1A, PTCH1, PTEN*, RAD51C*, RAD51D*, RB1, RECQL, RET, SDHA, SDHAF2, SDHB, SDHC, SDHD, SMAD4, SMARCA4, SMARCB1, SMARCE1, STK11, SUFU, TMEM127, TP53*, TSC1, TSC2, VHL and XRCC2 (sequencing and deletion/duplication); EGFR, EGLN1, HOXB13,  KIT, MITF, PDGFRA, POLD1, and POLE (sequencing only); EPCAM and GREM1 (deletion/duplication only). DNA and RNA analyses performed for * genes.    12/06/2020 Surgery   Bilateral Mastectomies:  Left Mastectomy:HG DCIS 2.2 cm with necrosis, Margins Neg ER 90%, PR 40% Right Mastectomy: Grade 2 IDC with DCIS 3.8 cm, 1/3 LN ER 100%, PR 100%, Her 2 Neg, Ki 67: 2-15%   12/13/2020 Miscellaneous   MammaPrint: Luminal type A, low risk   01/27/2021 - 03/14/2021 Radiation Therapy   Site Technique Total Dose (Gy) Dose per Fx (Gy) Completed Fx Beam Energies  Chest Wall, Right: CW_R 3D 50.4/50.4 1.8 28/28 6XFFF  Chest Wall, Right: CW_R_SCLV 3D 50.4/50.4 1.8 28/28 6X, 10X  Chest Wall, Right: CW_R_Bst Electron 10/10 2 5/5 6E     03/22/2021 -  Anti-estrogen oral therapy   Adjuvant tamoxifen    Ductal carcinoma in situ (DCIS) of left breast  01/03/2021 Initial Diagnosis   Ductal carcinoma in situ (DCIS) of left breast   06/21/2021 Cancer Staging   Staging form: Breast, AJCC 8th Edition - Clinical: Stage 0 (cTis (DCIS), cN0, cM0, ER+, PR+) - Signed by Crawford Morna Pickle, NP on 06/21/2021     CHIEF COMPLIANT: Follow-up of bilateral breast cancer  HISTORY OF PRESENT ILLNESS:  History of Present Illness Kaitlin [name] is a 49 year old female with 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  She is three months into adjuvant anastrozole , goserelin, and abemaciclib  with about ten months remaining. She has intermittent diarrhea, sometimes food triggered, and variable fatigue that has become more pronounced. Gastrointestinal symptoms have improved compared with treatment initiation, and she is tolerating the regimen overall.  Recent  labs show leukopenia with absolute neutrophil counts above 1.0, and mild  anemia and thrombocytopenia. She is having labs monitored every three months.  She continues monthly goserelin injections for ovarian suppression, which are uncomfortable but tolerable with improved technique. She takes anastrozole  with stable supply from a consistent manufacturer. She is considering future oophorectomy to avoid injections but is currently prioritizing surgical removal of breast implants due to chronic discomfort.  She has persistent tightness and tenderness of the chest wall and underlying tissue related to implants and surgical scars, worse when lying on her side or wearing a bra. The implants have dropped, increasing weight on the scarring and discomfort. She is weighing implant removal versus oophorectomy.  She recently had a mild influenza illness with brief fatigue and mild cough that has fully resolved, with no current respiratory symptoms.  She is highly engaged in her oncologic care and follows clinical trial developments, including SIRDs and vaccine studies. A Signatera test in December was negative for circulating tumor DNA. She is interested in possible clinical trial participation after completing the current regimen.     ALLERGIES:  is allergic to ceclor [cefaclor], cephalosporins, rizatriptan, amoxicillin, and penicillins.  MEDICATIONS:  Current Outpatient Medications  Medication Sig Dispense Refill   anastrozole  (ARIMIDEX ) 1 MG tablet Take 1 tablet (1 mg total) by mouth daily. 60 tablet 3   goserelin (ZOLADEX ) 3.6 MG injection Inject 3.6 mg into the skin.     ivermectin (STROMECTOL) 3 MG TABS tablet Take 12 mg by mouth once. Twice a week (Monday, Thursday)     lidocaine -prilocaine  (EMLA ) cream Apply 1 Application topically as needed. 30 g 0   Magnesium Glycinate 120 MG CAPS Take 240 mg by mouth at bedtime.     MISC NATURAL PRODUCTS PO Take by mouth daily. Super Digestive Enzymes by [employer]     Multiple Vitamins-Minerals (PREVENT) CAPS Take by mouth.      Omega-3 Fatty Acids (FISH OIL) 1000 MG CAPS Take 1,000 mg by mouth.     OVER THE COUNTER MEDICATION [place] therapy : twice a week 2.5mg      Probiotic, Lactobacillus, CAPS Take 1 tablet by mouth daily.     VERZENIO  50 MG tablet TAKE 1 TABLET BY MOUTH TWICE  DAILY SWALLOW TABLETS WHOLE (DO  NOT CRUSH OR CHEW) 56 tablet 5   Vitamin D-Vitamin K (VITAMIN K2-VITAMIN D3 PO) Take by mouth daily. Vitamin D: 1.25 mcg / Vitamin K: 90 mcg     No current facility-administered medications for this visit.    PHYSICAL EXAMINATION: ECOG PERFORMANCE STATUS: 1 - Symptomatic but completely ambulatory  Vitals:   01/28/24 0800  BP: 110/64  Pulse: 81  Resp: 18  Temp: 97.9 F (36.6 C)  SpO2: 100%   Filed Weights   01/28/24 0800  Weight: 150 lb 8 oz (68.3 kg)     LABORATORY DATA:  I have reviewed the data as listed    Latest Ref Rng & Units 11/05/2023    9:56 AM 08/14/2023    9:55 AM 06/19/2023   10:24 AM  CMP  Glucose 70 - 99 mg/dL 69  91  96   BUN 6 - 20 mg/dL 17  16  12    Creatinine 0.44 - 1.00 mg/dL 9.13  9.10  8.91   Sodium 135 - 145 mmol/L 140  138  138   Potassium 3.5 - 5.1 mmol/L 4.4  4.2  4.0   Chloride 98 - 111 mmol/L 106  104  102   CO2  22 - 32 mmol/L 31  30  27    Calcium 8.9 - 10.3 mg/dL 9.8  9.6  9.4   Total Protein 6.5 - 8.1 g/dL 7.0  7.3  7.3   Total Bilirubin 0.0 - 1.2 mg/dL 0.5  0.5  0.7   Alkaline Phos 38 - 126 U/L 58  57  59   AST 15 - 41 U/L 19  26  29    ALT 0 - 44 U/L 16  20  29      Lab Results  Component Value Date   WBC 2.6 (L) 01/28/2024   HGB 11.4 (L) 01/28/2024   HCT 33.1 (L) 01/28/2024   MCV 93.8 01/28/2024   PLT 257 01/28/2024   NEUTROABS 1.3 (L) 01/28/2024    ASSESSMENT & PLAN:  Bilateral breast cancer (HCC) 12/06/20: Bilateral Mastectomies:  Left Mastectomy:HG DCIS 2.2 cm with necrosis, Margins Neg ER 90%, PR 40% Right Mastectomy: Grade 2 IDC with DCIS 3.8 cm, 1/3 LN ER 100%, PR 100%, Her 2 Neg, Ki 67: 2-15%   Treatment Plan: 1. Mammaprint:  Luminal type a low risk 2. Adjuvant XRT completed 03/14/2021 3. Adjuvant Anti-estrogen therapy with tamoxifen  03/22/2021-08/29/22 4. Recurrence:  Right breast palpable mass: Ultrasound: 6 mm, biopsy: Grade 2 IDC ER 95%, PR 95%, Ki67 30%, HER2 2+ by IHC FISH negative 09/25/2022: PET/CT: Benign --------------------------------------------------------------------------------------------------------------------- Treatment plan: Right lumpectomy: 09/26/2022: Recurrent IDC grade 2 measuring 0.8 cm, margins negative,ER 95%, PR 95%, Ki67 30%, HER2 2+ by IHC FISH negative Adjuvant ovarian function suppression with anastrozole .  Along with Verzinio started 11/09/2022 tolerating 50 p.o. twice daily very well   Surveillance: guardant reveal June 2025: Negative   Abemaciclib  toxicities:  Intermittent diarrhea Constipation causing hemorrhoidal discomfort Hot flashes and vaginal dryness: Recent issues with cycles: Will check estradiol  levels Muscle aches and pains:  Leukopenia: ANC 1.3 today.  Continue with the same dosage of Verzenio .   Continue with monthly injections for Zoladex  Return to clinic every 3 months for follow-ups with labs ------------------------------------- Assessment and Plan Assessment & Plan Bilateral estrogen receptor positive breast cancer Bilateral ER+ breast cancer in remission on anastrozole  and goserelin. Therapy tolerated with manageable side effects. Chronic implant discomfort persists. Engaged in clinical trials and aware of treatment options. Improved prognosis with current therapy, but recurrence history increases risk. - Continued anastrozole  and goserelin. -  - Discussed clinical trials, including SIRD trial after two years of abemaciclib ; reviewed SIRDs' lower risk of arthralgia and myalgia and potency in estrogen receptor degradation. - Addressed implant discomfort; discussed removal with surgical team. - Maintained surveillance with WBC and differential every three months,  and clinical follow-up every three months. - Consider breast MRI if clinically indicated.  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 Recent mild influenza may have transiently affected counts. Hematologic parameters acceptable. Risk for cytopenias and infection. -  - CBC monitoring every three months unless symptoms or clinical changes warrant earlier testing. - Consider dose reduction or schedule modification if neutrophils <1.0. - Instructed her to report increased infection frequency or concerning symptoms.  Ovarian suppression therapy Receiving monthly goserelin injections for ovarian suppression, tolerable with improved technique. Considering future oophorectomy but prioritizing implant removal. Effective as part of adjuvant endocrine therapy.  - Continued monthly goserelin injections. -  -  - Will reassess ovarian suppression strategy in future visits, especially if surgical priorities change.      Orders Placed This Encounter  Procedures   CBC with Differential (Cancer Center Only)  Standing Status:   Future    Expiration Date:   01/27/2025   CMP (Cancer Center only)    Standing Status:   Future    Expiration Date:   01/27/2025   The patient has a good understanding of the overall plan. she agrees with it. she will call with any problems that may develop before the next visit here.  I personally spent a total of 30 minutes in the care of the patient today including preparing to see the patient, getting/reviewing separately obtained history, performing a medically appropriate exam/evaluation, counseling and educating, placing orders, referring and communicating with other health care professionals, documenting clinical information in the EHR, independently interpreting results, communicating results, and coordinating care.   Viinay K Yianna Tersigni, MD 01/28/2024    "

## 2024-02-05 ENCOUNTER — Inpatient Hospital Stay

## 2024-02-05 ENCOUNTER — Inpatient Hospital Stay: Admitting: Hematology and Oncology

## 2024-02-07 ENCOUNTER — Ambulatory Visit: Attending: Hematology and Oncology | Admitting: Physical Therapy

## 2024-02-07 DIAGNOSIS — R102 Pelvic and perineal pain unspecified side: Secondary | ICD-10-CM | POA: Insufficient documentation

## 2024-02-07 DIAGNOSIS — M6281 Muscle weakness (generalized): Secondary | ICD-10-CM | POA: Diagnosis present

## 2024-02-07 DIAGNOSIS — R293 Abnormal posture: Secondary | ICD-10-CM | POA: Insufficient documentation

## 2024-02-07 DIAGNOSIS — R279 Unspecified lack of coordination: Secondary | ICD-10-CM | POA: Diagnosis present

## 2024-02-07 NOTE — Therapy (Signed)
 " OUTPATIENT PHYSICAL THERAPY FEMALE PELVIC DISCHARGE SUMMARY   Patient Name: Kaitlin [name] MRN: [id number] DOB:17-Jan-1976, 49 y.o., female Today's Date: 02/07/2024  END OF SESSION:  PT End of Session - 02/07/24 0841     Visit Number 3    Number of Visits 10    Date for Recertification  02/17/24    Authorization Type [employer]    PT Start Time 0800    PT Stop Time 0845    PT Time Calculation (min) 45 min    Activity Tolerance Patient tolerated treatment well    Behavior During Therapy WFL for tasks assessed/performed           Past Medical History:  Diagnosis Date   Allergy    Anal fissure    Anemia    Asthma    Breast cancer (HCC)    twice   Cardiomyopathy (HCC)    Chronic diarrhea 11/05/2023   Chronic headaches    Delivery normal 11/2008   Family history of colon cancer    Family history of leukemia    Family history of melanoma    Family history of ovarian cancer    Heart murmur    Kidney stone    x of   PONV (postoperative nausea and vomiting)    S/P tonsillectomy    2000   Past Surgical History:  Procedure Laterality Date   BREAST BIOPSY Right 09/12/2022   US  RT BREAST BX W LOC DEV 1ST LESION IMG BX SPEC US  GUIDE 09/12/2022 [facility]   BREAST BIOPSY Left 01/18/2023   US  LT BREAST BX W LOC DEV 1ST LESION IMG BX SPEC US  GUIDE 01/18/2023 [facility]   BREAST CYST EXCISION Right 09/26/2022   Procedure: RIGHT BREAST CANCER RECURRENT EXCISION;  Surgeon: Ebbie Cough, MD;  Location: [facility];  Service: General;  Laterality: Right;  LMA   BREAST RECONSTRUCTION WITH PLACEMENT OF TISSUE EXPANDER AND ALLODERM Bilateral 12/06/2020   Procedure: BREAST RECONSTRUCTION WITH PLACEMENT OF TISSUE EXPANDER AND ALLODERM;  Surgeon: Arelia Filippo, MD;  Location: [facility];  Service: Plastics;  Laterality: Bilateral;   LEEP     MASTECTOMY W/ SENTINEL NODE BIOPSY Right 12/06/2020   Procedure: RIGHT MASTECTOMY WITH RIGHT AXILLARY  SENTINEL LYMPH NODE BIOPSY;  Surgeon: Ebbie Cough, MD;  Location: [facility];  Service: General;  Laterality: Right;   REMOVAL OF BILATERAL TISSUE EXPANDERS WITH PLACEMENT OF BILATERAL BREAST IMPLANTS Bilateral 09/15/2021   Procedure: REMOVAL OF BILATERAL TISSUE EXPANDERS WITH PLACEMENT OF BILATERAL BREAST IMPLANTS;  Surgeon: Arelia Filippo, MD;  Location: [facility];  Service: Plastics;  Laterality: Bilateral;   tonsil removal  01/22/1998   TOTAL MASTECTOMY Left 12/06/2020   Procedure: LEFT TOTAL MASTECTOMY;  Surgeon: Ebbie Cough, MD;  Location: [facility];  Service: General;  Laterality: Left;   Patient Active Problem List   Diagnosis Date Noted   Chronic diarrhea 11/05/2023   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   Malignant neoplasm of upper-outer quadrant of right breast in female, estrogen receptor positive (HCC) 12/12/2020   S/P bilateral mastectomy 12/06/2020   Genetic testing 11/17/2020   Family history of breast cancer 11/02/2020   Family history of ovarian cancer 11/02/2020   Family history of colon cancer 11/02/2020   Family history of melanoma 11/02/2020   Family history of leukemia 11/02/2020   Bilateral breast cancer (HCC) 11/01/2020   LLQ abdominal pain 04/06/2011    PCP: Claudene Pellet, MD  REFERRING PROVIDER: Odean Potts, MD   REFERRING DIAG: Z78.0 (ICD-10-CM) - Post-menopausal  THERAPY DIAG:  Abnormal posture  Muscle weakness (generalized)  Pelvic pain  Unspecified lack of coordination  Rationale for Evaluation and Treatment: Rehabilitation  ONSET DATE: 2010  SUBJECTIVE:                                                                                                                                                                                           SUBJECTIVE STATEMENT: Her shoulder is feeling much better. She has been strengthening her postural muscles. Vaginally,  she is feeling dry and itchy and painful when she applies creams. Hydration levels have been good. She has not attempted intercourse. No bowel or bladder concerns to report. She is amenable to discharge today.  Re-eval: Patient reports that she is doing well today. Back pain has been better. Shoulder has been the most sore recently. She went and got some custom bras at [employer] and she learned that she needs tighter bras for more support. She thinks that it is helping, but she is noticing some residual shoulder discomfort. Patient reports that she has addressed the majority of her goals - other than painful intercourse. This has been due to the lack of time. She wishes to continue pelvic floor PT to address vaginal wall tension and uncomfortable intercourse. She tries to have intercourse and it feels like a chore. She used her dilators a week ago and hasn't noticed much improvement.   From eval: Patient reports that she had breast cancer in 2022 and a local reoccurrence in Sept 2024, which led to her being placed in medical menopause in Oct of 2024. Since then, she has experienced vaginal dryness and tightness in the pelvic floor, contributing to hemorrhoids and painful intercourse. In 2010 and 2014 she had vaginal deliveries, both with tearing. She has seen a GI specialist and plans to have a colonoscopy soon. She has seen a pelvic PT for 3 visits and from that experience she has dilators (6 different sizes - has not used these yet) and cups. She has had a double mastectomy and has lots of scar tissue under the ribs, which she has used the cups for in the past. She feels like her pelvic floor gets crampy every now and again. No bladder related issues to report. She has a vibration plate and rebounder at home. She sees a chiro 1x/wk consistently.  Fluid intake: drinks a lot of water - tries to get 60-90oz   PAIN:  Are you having pain? No NPRS scale: 0/10 Pain with intercourse 12/09/23: 8/10     PRECAUTIONS: None  RED FLAGS: None   WEIGHT BEARING RESTRICTIONS: No  FALLS:  Has patient fallen in last 6 months? No  OCCUPATION: works currently with college students   ACTIVITY LEVEL : works out 5-6x/wk at [employer], walks 1 mile most days  PLOF: Independent with basic ADLs  PATIENT GOALS: decrease pelvic floor tension at rest and hemorrhoid discomfort, decrease scar tissue restriction present from mastectomy surgery and from vaginal tearing during deliveries   PERTINENT HISTORY:  LEEP vaginally 2015, double mastectomy, 3 endometrial polyps all benign in 09/2022   BOWEL MOVEMENT: Pain with bowel movement: No - will only have pain with hemorrhoids when they are present  Type of bowel movement:Type (Bristol Stool Scale) 3-4, Frequency 1-2x/day, Strain no, and Splinting no Fully empty rectum: No  Leakage: No Pads: No Fiber supplement/laxative Yes   URINATION: Pain with urination: No Fully empty bladder: Yes:   Stream: Strong Urgency: Yes  Frequency: within normal limits  Leakage: none Pads: No  INTERCOURSE: not currently sexually active due to pain - feels like barbwire when she tries to have intercourse    Ability to have vaginal penetration No  Pain with intercourse: Pain Interrupts Intercourse DrynessYes  Marinoff Scale: 3/3  PREGNANCY: Vaginal deliveries 2 Tearing Yes: with both deliveries, first tear was lateral in nature   PROLAPSE: None  OBJECTIVE:  Note: Objective measures were completed at Evaluation unless otherwise noted.  PATIENT SURVEYS:  PFIQ-7: 45  COGNITION: Overall cognitive status: Within functional limits for tasks assessed     SENSATION: Light touch: Appears intact  LUMBAR SPECIAL TESTS:  Single leg stance test: Negative  FUNCTIONAL TESTS:  Squat test: mild dynamic knee valgus with loading, no significant lumbopelvic stiffness present   GAIT: Assistive device utilized: None Comments: mild trendelenburg gait pattern with  ambulation   POSTURE: rounded shoulders  LUMBARAROM/PROM:  A/PROM A/PROM  eval  Flexion Within normal limits   Extension Within normal limits   Right lateral flexion Within normal limits   Left lateral flexion Within normal limits   Right rotation Within normal limits   Left rotation Within normal limits    (Blank rows = not tested)  LOWER EXTREMITY ROM: within functional limits   LOWER EXTREMITY MMT: 4/5 bilateral knees and hips grossly  PALPATION:   General: no significant tenderness to palpation of bilateral adductors or hip flexors   Pelvic Alignment: within normal limits   Abdominal: upper chest breathing and abdominal bracing at rest, decreased lower rib excursion with inhalation                 External Perineal Exam: dryness noted with mild clitoral hood mobility restriction present                              Internal Pelvic Floor: decreased tension at baseline, pain with palpation of right sided scar tissue from birth of son   Patient confirms identification and approves PT to assess internal pelvic floor and treatment Yes No emotional/communication barriers or cognitive limitation. Patient is motivated to learn. Patient understands and agrees with treatment goals and plan. PT explains patient will be examined in standing, sitting, and lying down to see how their muscles and joints work. When they are ready, they will be asked to remove their underwear so PT can examine their perineum. The patient is also given the option of providing their own chaperone as one is not provided in our facility. The patient also has  the right and is explained the right to defer or refuse any part of the evaluation or treatment including the internal exam. With the patient's consent, PT will use one gloved finger to gently assess the muscles of the pelvic floor, seeing how well it contracts and relaxes and if there is muscle symmetry. After, the patient will get dressed and PT and patient will  discuss exam findings and plan of care. PT and patient discuss plan of care, schedule, attendance policy and HEP activities.  PELVIC MMT:   MMT eval Re-eval 10/02/23 Re-eval 12/09/23  Vaginal 4/5, 5 quick flicks, 5 second hold 5/5 with improved ability to relax  Strength within normal limits, increased tone at perineal body and surrounding introitus  Internal Anal Sphincter     External Anal Sphincter     Puborectalis     Diastasis Recti     (Blank rows = not tested)  TONE: Increased following pelvic floor muscle contraction  PROLAPSE: N/A   TODAY'S TREATMENT:                                                                                                                              DATE:   11/26/23: Vibration plate for pelvic floor relaxation:  Standing with soft bend in knees + diaphragmatic breathing  Seated with internally rotated hips + diaphragmatic breathing 2x105min  Seated with externally rotated hips + diaphragmatic breathing 2x32min  Standing adductor stretch + diaphragmatic breathing 2x71min each side  Standing hamstring stretch + diaphragmatic breathing 2x38min each side Internal vaginal superficial and deep pelvic floor musculature release with focus on sustained pressure and sweeping release techniques  Internal vaginal canal mobilization/stretching to decrease sensitivity at introitus  Deep diaphragmatic breathing to relax pelvic floor musculature in supine 2x3 breaths  Deep pelvic floor release: bilateral sweeping and sustained pressure techniques  12/09/23: reevaluation day  Patient found to demonstrate increased tone around ribcage and pelvic floor/abdomen. Pelvic floor exam revealed the most tension present at left sided introitus region surrounding birth scar from internal tearing on right. Patient able to actively lengthen/bulge musculature much more effectively compared to initial evaluation. She still demonstrates increased tone at rest and a shallow vaginal  canal from cancer therapy and lack of estrogen. There is decreased blood flow present in bilateral superficial and deep labial musculature.  12/24/23: Vibration plate: Standing with soft bend in knees x73min  Piriformis stretch in seated x68min each side  Standing hamstring stretch x:30  Standing adductor groin stretch x:30  Internal vaginal superficial and deep pelvic floor musculature release with focus on sustained pressure and sweeping release techniques  Internal vaginal canal mobilization/stretching to decrease sensitivity at introitus  Deep diaphragmatic breathing to relax pelvic floor musculature in supine 2x3 breaths  Deep pelvic floor release: bilateral sweeping and sustained pressure techniques  02/07/24: Internal vaginal superficial and deep pelvic floor musculature release with focus on sustained pressure and sweeping release techniques  Internal vaginal canal mobilization/stretching  to decrease sensitivity at introitus  Deep diaphragmatic breathing to relax pelvic floor musculature in supine 2x3 breaths  Deep pelvic floor release: bilateral sweeping and sustained pressure techniques  PATIENT EDUCATION:  Education details: Relative anatomy and the connection between the diaphragm and pelvic floor, silicone based lubricant samples and education, vaginal moisturizer education Person educated: Patient Education method: Explanation, Demonstration, Tactile cues, Verbal cues, and Handouts Education comprehension: verbalized understanding, returned demonstration, verbal cues required, tactile cues required, and needs further education  HOME EXERCISE PROGRAM: Access Code: [email or web address] URL: [email or web address] Date: 10/02/2023 Prepared by: Celena Domino  Exercises - Yoga Squat for Pelvic Floor Relaxation with Yoga Block  - 1 x daily - 7 x weekly - 3 sets - hold  ASSESSMENT:  CLINICAL IMPRESSION: Patient is a 49 y.o. female  who was seen today for physical therapy  treatment for pain with vaginal penetration/during intercourse. Pt has attended PT consistently and feels confident with management of pelvic floor tension, vaginal discomfort during intercourse, and dilator training for stretching of vaginal canal. She has met most goals at this time and plans to continue dilator training independently at home to reach size 3 goal. Pt is appropriate for discharge at this time.   OBJECTIVE IMPAIRMENTS: decreased coordination, decreased endurance, decreased mobility, decreased ROM, decreased strength, and pain.   ACTIVITY LIMITATIONS: vaginal penetration during intercourse/pelvic examinations   PARTICIPATION LIMITATIONS: N/A  PERSONAL FACTORS: Age, Past/current experiences, and Time since onset of injury/illness/exacerbation are also affecting patient's functional outcome.   REHAB POTENTIAL: Good  CLINICAL DECISION MAKING: Stable/uncomplicated  EVALUATION COMPLEXITY: Low   GOALS: Goals reviewed with patient? Yes  SHORT TERM GOALS: Target date: 10/30/2023  Pt will be independent with HEP.  Baseline: Goal status:  GOAL MET 12/09/23  2.  Pt will be independent with diaphragmatic breathing and down training activities in order to improve pelvic floor relaxation. Baseline:  Goal status:  GOAL MET 12/09/23  3.  Pt will be ind with using dilators for reducing pelvic floor tension and pain management to decrease pain during vaginal penetration and improve quality oflife.  Goal status: GOAL MET 12/09/23   LONG TERM GOALS: Target date: 03/31/2024  Pt will be independent with advanced HEP.  Baseline:  Goal status:  GOAL MET 02/07/24  2.  Pt will be ind with using dilator size 3 for reducing pelvic floor tension and pain management to allow patient to return to vaginal penetration without significant pain to improve quality of life and relationship with partner.  Baseline: dilator size 2  Goal status:  GOAL MET 02/07/24  3.  Pt will report less than 5/10  pain with vaginal penetration in order to improve intimate relationship with partner and allow patient to participate in pelvic examinations with little to no pain. Baseline: 8/10  Goal status:   GOAL MET 02/07/24  4. Patient will report confidence with all intercourse management techniques including lubricant use, dilator training, foreplay, positioning, and downtraining techniques to decrease pain with intercourse as much as possible to improve quality of life.   Goal status:  GOAL MET 02/07/24  PHYSICAL THERAPY DISCHARGE SUMMARY  Visits from Start of Care: 18  Current functional level related to goals / functional outcomes: See above    Remaining deficits: See above   Education / Equipment: See above   Patient agrees to discharge. Patient goals were met. Patient is being discharged due to meeting the stated rehab goals.   Celena JAYSON Domino, PT 02/07/2024, 8:41 AM  "

## 2024-02-08 ENCOUNTER — Other Ambulatory Visit: Payer: Self-pay | Admitting: Hematology and Oncology

## 2024-02-28 ENCOUNTER — Inpatient Hospital Stay: Attending: Hematology and Oncology

## 2024-02-28 VITALS — BP 108/61 | HR 60 | Temp 98.6°F | Resp 18

## 2024-02-28 DIAGNOSIS — Z170 Estrogen receptor positive status [ER+]: Secondary | ICD-10-CM

## 2024-02-28 MED ORDER — GOSERELIN ACETATE 3.6 MG ~~LOC~~ IMPL
3.6000 mg | DRUG_IMPLANT | Freq: Once | SUBCUTANEOUS | Status: AC
  Administered 2024-02-28: 3.6 mg via SUBCUTANEOUS
  Filled 2024-02-28: qty 3.6

## 2024-03-27 ENCOUNTER — Inpatient Hospital Stay: Attending: Hematology and Oncology

## 2024-04-27 ENCOUNTER — Inpatient Hospital Stay: Attending: Hematology and Oncology | Admitting: Hematology and Oncology

## 2024-04-27 ENCOUNTER — Inpatient Hospital Stay

## 2024-06-01 ENCOUNTER — Ambulatory Visit
